# Patient Record
Sex: Female | Born: 1960
Health system: Southern US, Community
[De-identification: ages and names within clinical notes are randomized; demographics above are authoritative.]

## PROBLEM LIST (undated history)

## (undated) DIAGNOSIS — E669 Obesity, unspecified: Secondary | ICD-10-CM

## (undated) DIAGNOSIS — F32A Depression, unspecified: Secondary | ICD-10-CM

## (undated) DIAGNOSIS — F329 Major depressive disorder, single episode, unspecified: Secondary | ICD-10-CM

## (undated) DIAGNOSIS — T4145XA Adverse effect of unspecified anesthetic, initial encounter: Secondary | ICD-10-CM

## (undated) DIAGNOSIS — B977 Papillomavirus as the cause of diseases classified elsewhere: Secondary | ICD-10-CM

## (undated) DIAGNOSIS — IMO0002 Reserved for concepts with insufficient information to code with codable children: Secondary | ICD-10-CM

## (undated) DIAGNOSIS — N915 Oligomenorrhea, unspecified: Secondary | ICD-10-CM

## (undated) DIAGNOSIS — C801 Malignant (primary) neoplasm, unspecified: Secondary | ICD-10-CM

## (undated) DIAGNOSIS — G4700 Insomnia, unspecified: Secondary | ICD-10-CM

## (undated) DIAGNOSIS — N8500 Endometrial hyperplasia, unspecified: Secondary | ICD-10-CM

## (undated) DIAGNOSIS — K829 Disease of gallbladder, unspecified: Secondary | ICD-10-CM

## (undated) DIAGNOSIS — N970 Female infertility associated with anovulation: Secondary | ICD-10-CM

## (undated) DIAGNOSIS — E119 Type 2 diabetes mellitus without complications: Secondary | ICD-10-CM

## (undated) DIAGNOSIS — C50919 Malignant neoplasm of unspecified site of unspecified female breast: Secondary | ICD-10-CM

## (undated) DIAGNOSIS — F419 Anxiety disorder, unspecified: Secondary | ICD-10-CM

## (undated) DIAGNOSIS — K219 Gastro-esophageal reflux disease without esophagitis: Secondary | ICD-10-CM

## (undated) DIAGNOSIS — I10 Essential (primary) hypertension: Secondary | ICD-10-CM

## (undated) DIAGNOSIS — I2510 Atherosclerotic heart disease of native coronary artery without angina pectoris: Secondary | ICD-10-CM

## (undated) DIAGNOSIS — E785 Hyperlipidemia, unspecified: Secondary | ICD-10-CM

## (undated) DIAGNOSIS — F1010 Alcohol abuse, uncomplicated: Secondary | ICD-10-CM

## (undated) DIAGNOSIS — J302 Other seasonal allergic rhinitis: Secondary | ICD-10-CM

## (undated) DIAGNOSIS — T8859XA Other complications of anesthesia, initial encounter: Secondary | ICD-10-CM

## (undated) DIAGNOSIS — G2581 Restless legs syndrome: Secondary | ICD-10-CM

## (undated) DIAGNOSIS — N840 Polyp of corpus uteri: Secondary | ICD-10-CM

## (undated) DIAGNOSIS — Z8742 Personal history of other diseases of the female genital tract: Secondary | ICD-10-CM

## (undated) DIAGNOSIS — R7303 Prediabetes: Secondary | ICD-10-CM

## (undated) DIAGNOSIS — L720 Epidermal cyst: Secondary | ICD-10-CM

## (undated) DIAGNOSIS — E559 Vitamin D deficiency, unspecified: Secondary | ICD-10-CM

## (undated) DIAGNOSIS — G4730 Sleep apnea, unspecified: Secondary | ICD-10-CM

## (undated) DIAGNOSIS — Z8719 Personal history of other diseases of the digestive system: Secondary | ICD-10-CM

## (undated) DIAGNOSIS — R87619 Unspecified abnormal cytological findings in specimens from cervix uteri: Secondary | ICD-10-CM

## (undated) HISTORY — DX: Disease of gallbladder, unspecified: K82.9

## (undated) HISTORY — DX: Obesity, unspecified: E66.9

## (undated) HISTORY — DX: Personal history of other diseases of the female genital tract: Z87.42

## (undated) HISTORY — DX: Hyperlipidemia, unspecified: E78.5

## (undated) HISTORY — DX: Prediabetes: R73.03

## (undated) HISTORY — DX: Type 2 diabetes mellitus without complications: E11.9

## (undated) HISTORY — DX: Personal history of other diseases of the digestive system: Z87.19

## (undated) HISTORY — DX: Endometrial hyperplasia, unspecified: N85.00

## (undated) HISTORY — DX: Unspecified abnormal cytological findings in specimens from cervix uteri: R87.619

## (undated) HISTORY — DX: Female infertility associated with anovulation: N97.0

## (undated) HISTORY — DX: Papillomavirus as the cause of diseases classified elsewhere: B97.7

## (undated) HISTORY — DX: Insomnia, unspecified: G47.00

## (undated) HISTORY — DX: Alcohol abuse, uncomplicated: F10.10

## (undated) HISTORY — DX: Oligomenorrhea, unspecified: N91.5

## (undated) HISTORY — DX: Atherosclerotic heart disease of native coronary artery without angina pectoris: I25.10

## (undated) HISTORY — PX: COLONOSCOPY: SHX174

## (undated) HISTORY — DX: Polyp of corpus uteri: N84.0

## (undated) HISTORY — PX: OTHER SURGICAL HISTORY: SHX169

## (undated) HISTORY — PX: HYSTEROSCOPY: SHX211

## (undated) HISTORY — DX: Vitamin D deficiency, unspecified: E55.9

## (undated) HISTORY — PX: POLYPECTOMY: SHX149

## (undated) HISTORY — DX: Anxiety disorder, unspecified: F41.9

## (undated) HISTORY — DX: Reserved for concepts with insufficient information to code with codable children: IMO0002

## (undated) HISTORY — DX: Essential (primary) hypertension: I10

## (undated) HISTORY — DX: Epidermal cyst: L72.0

---
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
## 1999-08-29 ENCOUNTER — Other Ambulatory Visit: Admission: RE | Admit: 1999-08-29 | Discharge: 1999-08-29 | Payer: Self-pay | Admitting: *Deleted

## 2000-08-12 ENCOUNTER — Encounter: Payer: Self-pay | Admitting: Obstetrics and Gynecology

## 2000-08-12 ENCOUNTER — Encounter: Admission: RE | Admit: 2000-08-12 | Discharge: 2000-08-12 | Payer: Self-pay | Admitting: Obstetrics and Gynecology

## 2000-09-10 ENCOUNTER — Other Ambulatory Visit: Admission: RE | Admit: 2000-09-10 | Discharge: 2000-09-10 | Payer: Self-pay | Admitting: Obstetrics and Gynecology

## 2001-01-31 ENCOUNTER — Encounter: Payer: Self-pay | Admitting: Obstetrics and Gynecology

## 2001-01-31 ENCOUNTER — Inpatient Hospital Stay (HOSPITAL_COMMUNITY): Admission: AD | Admit: 2001-01-31 | Discharge: 2001-01-31 | Payer: Self-pay | Admitting: Obstetrics and Gynecology

## 2001-02-04 ENCOUNTER — Other Ambulatory Visit: Admission: RE | Admit: 2001-02-04 | Discharge: 2001-02-04 | Payer: Self-pay | Admitting: Obstetrics and Gynecology

## 2001-03-04 ENCOUNTER — Ambulatory Visit (HOSPITAL_COMMUNITY): Admission: RE | Admit: 2001-03-04 | Discharge: 2001-03-04 | Payer: Self-pay | Admitting: Obstetrics and Gynecology

## 2001-03-04 ENCOUNTER — Encounter: Payer: Self-pay | Admitting: Obstetrics and Gynecology

## 2001-03-26 ENCOUNTER — Ambulatory Visit (HOSPITAL_COMMUNITY): Admission: RE | Admit: 2001-03-26 | Discharge: 2001-03-26 | Payer: Self-pay | Admitting: Obstetrics and Gynecology

## 2001-03-26 ENCOUNTER — Encounter: Payer: Self-pay | Admitting: Obstetrics and Gynecology

## 2001-08-17 ENCOUNTER — Inpatient Hospital Stay (HOSPITAL_COMMUNITY): Admission: AD | Admit: 2001-08-17 | Discharge: 2001-08-20 | Payer: Self-pay | Admitting: Obstetrics and Gynecology

## 2002-02-24 ENCOUNTER — Other Ambulatory Visit: Admission: RE | Admit: 2002-02-24 | Discharge: 2002-02-24 | Payer: Self-pay | Admitting: Obstetrics and Gynecology

## 2002-04-02 ENCOUNTER — Other Ambulatory Visit: Admission: RE | Admit: 2002-04-02 | Discharge: 2002-04-02 | Payer: Self-pay | Admitting: Obstetrics and Gynecology

## 2002-05-04 ENCOUNTER — Ambulatory Visit (HOSPITAL_BASED_OUTPATIENT_CLINIC_OR_DEPARTMENT_OTHER): Admission: RE | Admit: 2002-05-04 | Discharge: 2002-05-04 | Payer: Self-pay | Admitting: Orthopedic Surgery

## 2002-05-18 ENCOUNTER — Ambulatory Visit (HOSPITAL_BASED_OUTPATIENT_CLINIC_OR_DEPARTMENT_OTHER): Admission: RE | Admit: 2002-05-18 | Discharge: 2002-05-18 | Payer: Self-pay | Admitting: Orthopedic Surgery

## 2002-09-30 ENCOUNTER — Encounter: Payer: Self-pay | Admitting: Obstetrics and Gynecology

## 2002-09-30 ENCOUNTER — Ambulatory Visit (HOSPITAL_COMMUNITY): Admission: RE | Admit: 2002-09-30 | Discharge: 2002-09-30 | Payer: Self-pay | Admitting: Obstetrics and Gynecology

## 2002-12-07 ENCOUNTER — Other Ambulatory Visit: Admission: RE | Admit: 2002-12-07 | Discharge: 2002-12-07 | Payer: Self-pay | Admitting: *Deleted

## 2003-03-06 ENCOUNTER — Inpatient Hospital Stay (HOSPITAL_COMMUNITY): Admission: AD | Admit: 2003-03-06 | Discharge: 2003-03-06 | Payer: Self-pay | Admitting: Obstetrics and Gynecology

## 2003-03-10 ENCOUNTER — Inpatient Hospital Stay (HOSPITAL_COMMUNITY): Admission: AD | Admit: 2003-03-10 | Discharge: 2003-03-11 | Payer: Self-pay | Admitting: Obstetrics and Gynecology

## 2003-08-03 ENCOUNTER — Other Ambulatory Visit: Admission: RE | Admit: 2003-08-03 | Discharge: 2003-08-03 | Payer: Self-pay | Admitting: Obstetrics and Gynecology

## 2004-09-04 ENCOUNTER — Other Ambulatory Visit: Admission: RE | Admit: 2004-09-04 | Discharge: 2004-09-04 | Payer: Self-pay | Admitting: Obstetrics and Gynecology

## 2004-10-05 ENCOUNTER — Ambulatory Visit (HOSPITAL_COMMUNITY): Admission: RE | Admit: 2004-10-05 | Discharge: 2004-10-05 | Payer: Self-pay | Admitting: Obstetrics and Gynecology

## 2005-09-11 ENCOUNTER — Other Ambulatory Visit: Admission: RE | Admit: 2005-09-11 | Discharge: 2005-09-11 | Payer: Self-pay | Admitting: Obstetrics and Gynecology

## 2005-10-09 ENCOUNTER — Ambulatory Visit (HOSPITAL_COMMUNITY): Admission: RE | Admit: 2005-10-09 | Discharge: 2005-10-09 | Payer: Self-pay | Admitting: Obstetrics and Gynecology

## 2006-11-25 ENCOUNTER — Ambulatory Visit (HOSPITAL_COMMUNITY): Admission: RE | Admit: 2006-11-25 | Discharge: 2006-11-25 | Payer: Self-pay | Admitting: Obstetrics and Gynecology

## 2007-11-27 ENCOUNTER — Ambulatory Visit (HOSPITAL_COMMUNITY): Admission: RE | Admit: 2007-11-27 | Discharge: 2007-11-27 | Payer: Self-pay | Admitting: Obstetrics and Gynecology

## 2008-11-30 ENCOUNTER — Ambulatory Visit (HOSPITAL_COMMUNITY): Admission: RE | Admit: 2008-11-30 | Discharge: 2008-11-30 | Payer: Self-pay | Admitting: Obstetrics and Gynecology

## 2010-01-04 ENCOUNTER — Ambulatory Visit (HOSPITAL_COMMUNITY): Admission: RE | Admit: 2010-01-04 | Discharge: 2010-01-04 | Payer: Self-pay | Admitting: Obstetrics and Gynecology

## 2010-06-04 ENCOUNTER — Ambulatory Visit (HOSPITAL_COMMUNITY)
Admission: RE | Admit: 2010-06-04 | Discharge: 2010-06-04 | Payer: Self-pay | Source: Home / Self Care | Admitting: Obstetrics and Gynecology

## 2010-06-04 HISTORY — PX: OTHER SURGICAL HISTORY: SHX169

## 2010-07-22 ENCOUNTER — Encounter: Payer: Self-pay | Admitting: Obstetrics and Gynecology

## 2010-07-26 ENCOUNTER — Emergency Department (HOSPITAL_COMMUNITY)
Admission: EM | Admit: 2010-07-26 | Discharge: 2010-07-26 | Payer: Self-pay | Source: Home / Self Care | Admitting: Family Medicine

## 2010-09-10 LAB — CBC
Hemoglobin: 11.4 g/dL — ABNORMAL LOW (ref 12.0–15.0)
RBC: 4.12 MIL/uL (ref 3.87–5.11)

## 2010-09-10 LAB — PREGNANCY, URINE

## 2010-11-16 NOTE — Op Note (Signed)
   NAME:  Candace [name], Candace [name]                            ACCOUNT NO.:  [id number]   MEDICAL RECORD NO.:  [id number]                   PATIENT TYPE:  AMB   LOCATION:  DSC                                  FACILITY:  [facility]   PHYSICIAN:  Cindee Salt, M.D.                    DATE OF BIRTH:  [birth year]   DATE OF PROCEDURE:  05/18/2002  DATE OF DISCHARGE:                                 OPERATIVE REPORT   PREOPERATIVE DIAGNOSIS:  Stenosing tenosynovitis right thumb.   POSTOPERATIVE DIAGNOSIS:  Stenosing tenosynovitis right thumb.   OPERATION:  Release A1 pulley left thumb.   SURGEON:  Cindee Salt, M.D.   ASSISTANTCarolyne Fiscal, R.N.   ANESTHESIA:  Forearm based IV regional with supplementation.   INDICATIONS FOR PROCEDURE:  The patient is a 50 year old female with  triggering of her thumbs. She has undergone release of her right and is  admitted down for release of the left.   DESCRIPTION OF PROCEDURE:  The patient is brought to the operating room  where a forearm based IV regional anesthetic was carried out without  difficulty.  She was prepped and draped using Betadine scrubbing solution  with the left arm free.  A transverse incision was made over the A1 pulley,  carried down through subcutaneous tissue. Bleeders were electrocauterized.  The A1 pulley was identified.  This was released in its radial aspect  protecting the radial  nerve.  The thumb placed through full range of  motion.  No further triggering was identified. The wound was irrigated. Skin  closed with interrupted 5-0 nylon suture with sterile compressive dressing  was applied.  The patient tolerated the procedure well and was taken to the  recovery room for observation in satisfactory condition.  She is discharged  home, to return to the [facility] [place] in one week on Vicodin and  Septra DS.                                               Cindee Salt, M.D.    GK/MEDQ  D:  05/18/2002  T:  05/18/2002  Job:  [id number]

## 2010-11-16 NOTE — Discharge Summary (Signed)
[facility] of [facility]  Patient:    Candace [name], Candace [name] Visit Number: [id number] MRN: [id number]          Service Type: OBS Location: 910B 9165 01 Attending Physician:  Leonard Schwartz Dictated by:   Wynelle Bourgeois, CNM Admit Date:  08/17/2001 Disc. Date: 08/20/01                             Discharge Summary  ADMISSION DIAGNOSES: 1. Intrauterine pregnancy at 38 weeks. 2. Spontaneous rupture of membranes at term. 3. Prodromal labor.  DISCHARGE DIAGNOSES: 1. Intrauterine pregnancy at 38 weeks. 2. Spontaneous rupture of membranes at term. 3. Prodromal labor. 4. Variable fetal heart rate decelerations. 5. Prolonged second stage.  PROCEDURES: 1. Epidural anesthesia. 2. Vacuum assisted vaginal delivery. 3. Perineal laceration repair.  HOSPITAL COURSE:  The patient was admitted on 08/17/01, after rupturing membranes at term.  She was in prodromal labor at that time, and elected expectant management.  Through the night, her labor progressed very slowly, and eventually Pitocin augmentation was begun on the next morning.  She progressed steadily throughout the following day, got to complete dilation and pushed for several hours, achieving +2 station, but was unable to push the vertex under the pubic bone.  Dr. Normand Sloop was consulted, and assisted with a vacuum extraction which was performed successfully with only four pulls.  The baby was delivered easily, and handed to the pediatric team, and did well after that.  Cord pH was 7.124, and Apgars were 1 and 9.  The babys name is [name], and she weighed 7 pounds 10 ounces.  Mother and baby did well on the following two postpartum days, and on postpartum day #2, she was ready to go home.  Her main complaint consisted of hemorrhoid pain.  Her vital signs were stable.  Maximum temperature was 98.2, heart rate was regular.  Lungs were clear.  Extremities were within normal limits, except for mild left peroneal  dermatome paresthesia.  Her fundus was firm, lochia was small, perineum was healing.  She was breast-feeding well, and she was deemed to have received full benefit of her hospital stay and was discharged home.  DISCHARGE LABORATORY DATA:  White blood cell count 12.9, hemoglobin 11.9, hematocrit 34.9, platelets 172.  DISCHARGE MEDICATIONS: 1. Motrin 600 mg p.o. q.6h. p.r.n. 2. ProctoFoam HC apply as directed p.r.n. to hemorrhoids.  DISCHARGE INSTRUCTIONS:  Via [employer] OB/GYN handout.  FOLLOWUP:  In six weeks at [facility] or p.r.n. Dictated by:   Wynelle Bourgeois, CNM Attending Physician:  Leonard Schwartz DD:  08/20/01 TD:  08/20/01 Job: [id number]

## 2010-11-16 NOTE — Op Note (Signed)
   NAME:  Candace [name], Candace [name]                            ACCOUNT NO.:  [id number]   MEDICAL RECORD NO.:  [id number]                   PATIENT TYPE:  SPE   LOCATION:  DFTL                                 FACILITY:  [facility]   PHYSICIAN:  Cindee Salt, M.D.                    DATE OF BIRTH:  [birth year]   DATE OF PROCEDURE:  05/04/2002  DATE OF DISCHARGE:  04/02/2002                                 OPERATIVE REPORT   PREOPERATIVE DIAGNOSIS:  Stenosing tenosynovitis, right thumb.   POSTOPERATIVE DIAGNOSIS:  Stenosing tenosynovitis, right thumb.   OPERATION:  Release A1 pulley right thumb.   SURGEON:  Cindee Salt, M.D.   ASSISTANT:  R.N.   ANESTHESIA:  Forearm based IV regional.   INDICATIONS:  The patient is a 50 year old female with a history of  treatment of multiple digits. She is brought in for release of her left  thumb A1 pulley and DeQuervain's, however, has a burn on that side and  desires proceeding to have the right A1 pulley thumb released for  triggering. This was agreed to.   DESCRIPTION OF PROCEDURE:  The patient was brought to the operating room and  a forearm based IV regional anesthetic was carried out without difficulty.  She was prepped and draped using Duraprep. A transverse incision was made  over the A1 pulley of the right thumb and was carried down through the  subcutaneous tissue and the neurovascular structures were identified and  protected. The A1 pulley was identified and an incision was then made on the  radial side. This fully released the tendon which was  passed through a full  range of motion without further triggering. The oblique pulley was left  intact.  The wound was irrigated. The skin was closed with interrupted 5-0  nylon suture. A sterile compressive dressing was applied.  The patient tolerated the procedure well and was taken to the recovery room  for observation in satisfactory condition. She was discharged to home to  return to the [facility]  [place] in one week on Vicodin and Keflex.                                               Cindee Salt, M.D.    GK/MEDQ  D:  05/04/2002  T:  05/04/2002  Job:  [id number]
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
## 2010-11-16 NOTE — Op Note (Signed)
[facility] of [facility]  Patient:    Candace [name], Candace [name] Visit Number: [id number] MRN: [id number]          Service Type: OBS Location: MATC Attending Physician:  Leonard Schwartz Proc. Date: 03/04/01 Admit Date:  01/31/2001 Discharge Date: 01/31/2001                             Operative Report  PREOPERATIVE DIAGNOSIS:       Intrauterine pregnancy at [redacted] weeks gestation, maternal age of 12.  POSTOPERATIVE DIAGNOSIS:      Intrauterine pregnancy at [redacted] weeks gestation, maternal age of 64.  OPERATION:                    Genetic amniocentesis under ultrasound guidance.  SURGEON:                      Vanessa P. Haygood, M.D.  ASSISTANT:  ANESTHESIA:                   Local.  ESTIMATED BLOOD LOSS:         Less than 10 cc.  COMPLICATIONS:                None.  FINDINGS:                     The initial ultrasound revealed an anterior placenta, a fetus consistent with approximately 15 to [redacted] weeks gestation, and adequate amniotic fluid.  DESCRIPTION OF PROCEDURE:     After a discussion of risks and benefits had been held on the telephone, the procedure was scheduled.  The patient underwent a screening ultrasound to document fetal viability and amniosite. After an amniosite had been identified which was away from the cord insertion site and free of fetal parts or cord, an area to the right of the midline and suprapubic was marked with a pen.  This area was prepped with multiple layers of Betadine and draped as a sterile field.  The area was infiltrated with 3 cc of 1% Xylocaine.  The amniotic fluid pocket was then accessed with a 22 gauge needle on a single stick under ultrasound guidance.  5 cc of clear fluid were drawn into the first syringe and the syringes changed with 15 cc of clear fluid drawn into the second syringe.  The amniocentesis site was documented on ultrasound and the needle removed.  The post amniocentesis heart rate was 150 beats per minute.   The patient tolerated the procedure well.  She was given written instructions for post amniocentesis care.  The fluid was transferred sterilely to transport tubes and was sent to [facility] for evaluation. Attending Physician:  Leonard Schwartz DD:  03/04/01 TD:  03/04/01 Job: [id number]

## 2011-01-07 ENCOUNTER — Other Ambulatory Visit (HOSPITAL_COMMUNITY): Payer: Self-pay | Admitting: Obstetrics and Gynecology

## 2011-01-07 DIAGNOSIS — Z1231 Encounter for screening mammogram for malignant neoplasm of breast: Secondary | ICD-10-CM

## 2011-01-16 ENCOUNTER — Ambulatory Visit (HOSPITAL_COMMUNITY)
Admission: RE | Admit: 2011-01-16 | Discharge: 2011-01-16 | Disposition: A | Discharge status: Home / Self Care | Payer: 59 | Source: Ambulatory Visit | Attending: Obstetrics and Gynecology | Admitting: Obstetrics and Gynecology

## 2011-01-16 DIAGNOSIS — Z1231 Encounter for screening mammogram for malignant neoplasm of breast: Secondary | ICD-10-CM | POA: Insufficient documentation

## 2011-01-21 ENCOUNTER — Other Ambulatory Visit: Payer: Self-pay | Admitting: Obstetrics and Gynecology

## 2011-01-21 DIAGNOSIS — R928 Other abnormal and inconclusive findings on diagnostic imaging of breast: Secondary | ICD-10-CM

## 2011-02-05 ENCOUNTER — Ambulatory Visit
Admission: RE | Admit: 2011-02-05 | Discharge: 2011-02-05 | Disposition: A | Discharge status: Home / Self Care | Payer: 59 | Source: Ambulatory Visit | Attending: Obstetrics and Gynecology | Admitting: Obstetrics and Gynecology

## 2011-02-05 DIAGNOSIS — R928 Other abnormal and inconclusive findings on diagnostic imaging of breast: Secondary | ICD-10-CM

## 2011-03-22 ENCOUNTER — Other Ambulatory Visit: Payer: Self-pay | Admitting: Orthopaedic Surgery

## 2011-03-22 ENCOUNTER — Encounter (HOSPITAL_COMMUNITY): Payer: 59

## 2011-03-22 LAB — SURGICAL PCR SCREEN
MRSA, PCR: NEGATIVE
Staphylococcus aureus: NEGATIVE

## 2011-03-22 LAB — HCG, SERUM, QUALITATIVE: Preg, Serum: NEGATIVE

## 2011-03-22 LAB — PROTIME-INR: INR: 0.98 (ref 0.00–1.49)

## 2011-03-24 ENCOUNTER — Inpatient Hospital Stay (INDEPENDENT_AMBULATORY_CARE_PROVIDER_SITE_OTHER)
Admission: RE | Admit: 2011-03-24 | Discharge: 2011-03-24 | Disposition: A | Discharge status: Home / Self Care | Payer: 59 | Source: Ambulatory Visit | Attending: Emergency Medicine | Admitting: Emergency Medicine

## 2011-03-24 DIAGNOSIS — L089 Local infection of the skin and subcutaneous tissue, unspecified: Secondary | ICD-10-CM

## 2011-03-26 ENCOUNTER — Ambulatory Visit (HOSPITAL_COMMUNITY)
Admission: RE | Admit: 2011-03-26 | Discharge: 2011-03-26 | Disposition: A | Discharge status: Home / Self Care | Payer: 59 | Source: Ambulatory Visit | Attending: Orthopaedic Surgery | Admitting: Orthopaedic Surgery

## 2011-03-26 DIAGNOSIS — G5600 Carpal tunnel syndrome, unspecified upper limb: Secondary | ICD-10-CM | POA: Insufficient documentation

## 2011-03-26 DIAGNOSIS — Z01812 Encounter for preprocedural laboratory examination: Secondary | ICD-10-CM | POA: Insufficient documentation

## 2011-03-26 HISTORY — PX: CARPAL TUNNEL RELEASE: SHX101

## 2011-03-27 LAB — CULTURE, ROUTINE-ABSCESS

## 2011-03-30 NOTE — H&P (Signed)
  Candace [name], FATH NO.:  [id number]  MEDICAL RECORD NO.:  [id number]  LOCATION:  DAYL                         FACILITY:  [facility]  PHYSICIAN:  Vanita Panda. Magnus Ivan, M.D.DATE OF BIRTH:  1961/05/08  DATE OF ADMISSION:  03/26/2011 DATE OF DISCHARGE:                             HISTORY & PHYSICAL   CHIEF COMPLAINT:  Right hand numbness and tingling and known moderate to severe carpal tunnel syndrome.  HISTORY OF PRESENT ILLNESS:  Candace [name] is a 50 year old right hand dominant female who is actually well known to me and a good friend.  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  She has had bilateral hand numbness and tingling for some time now and even some pain and we have had nerve conduction studies of both her upper extremities recently, which did show moderate to quite severe nerve entrapment of the median nerve at the wrist on both sides.  She is at the point she does wish to proceed with carpal tunnel release and she would like to start with the right side.  She understands the risk and benefits of surgery as well and the fact that she will get some short term improvement and then the long term improvement will come as the nerve starts to repair itself.  PAST MEDICAL HISTORY:  Depression.  MEDICATIONS: 1. Wellbutrin. 2. Prozac. 3. Multivitamin. 4. Zyrtec.  ALLERGIES:  No known drug allergies.  FAMILY MEDICAL HISTORY:  Heart disease.  SOCIAL HISTORY:  She is separated currently.  She works in hospital administration, in bed control.  She does not smoke and does not drink.  REVIEW OF SYSTEMS:  Negative for chest pain, shortness of breath, fever, chills, nausea, vomiting.  PHYSICAL EXAMINATION:  VITAL SIGNS:  She is afebrile with stable vital signs. GENERAL:  She is alert and oriented x3, in no acute distress, obvious discomfort. HEENT:  Normocephalic, atraumatic.  Pupils equal, round, and reactive to light.  Extraocular muscles  intact. NECK:  Supple. LUNGS:  Clear to auscultation bilaterally. HEART:  Regular rate and rhythm. ABDOMEN:  Benign. EXTREMITIES:  Right hand shows no muscle atrophy at all.  She has positive Phalen's and Tinel's signs and carpal tunnel and subjective numbness in the median nerve distribution.  Nerve conduction studies confirmed moderate to severe carpal tunnel syndrome of the right upper extremity.  ASSESSMENT:  This is a 50 year old female with severe carpal tunnel syndrome involving her bilateral upper extremities, right worse than left.  PLAN:  We will proceed with a right carpal tunnel release today.  Again, she understands the risks and benefits of this and does wish to proceed with surgery.     Vanita Panda. Magnus Ivan, M.D.     CYB/MEDQ  D:  03/26/2011  T:  03/26/2011  Job:  [id number]  Electronically Signed by Doneen Poisson M.D. on 03/30/2011 11:46:26 AM

## 2011-03-30 NOTE — Op Note (Signed)
  [name], [name] NO.:  [id number]  MEDICAL RECORD NO.:  [id number]  LOCATION:  [facility]                         FACILITY:  [facility]  PHYSICIAN:  Vanita Panda. Magnus Ivan, M.D.DATE OF BIRTH:  12/02/1960  DATE OF PROCEDURE:  03/26/2011 DATE OF DISCHARGE:  03/26/2011                              OPERATIVE REPORT   PREOPERATIVE DIAGNOSIS:  Right upper extremity moderate to severe carpal tunnel syndrome.  POSTOPERATIVE DIAGNOSIS:  Right upper extremity moderate to severe carpal tunnel syndrome.  PROCEDURE:  Right open carpal tunnel release.  SURGEON:  Vanita Panda. Magnus Ivan, M.D.  ANTIBIOTICS: 1. 2 g IV Ancef. 2. 600 mg IV clindamycin.  TOURNIQUET TIME:  Less than 50 minutes.  BLOOD LOSS:  Minimal.  ANESTHESIA: 1. General. 2. Local 0.25% plain Sensorcaine.  COMPLICATIONS:  None.  INDICATIONS:  Candace [name] is a 50 year old right-hand dominant female with bilateral moderate to severe carpal tunnel syndrome with right as her dominant side and she wished to proceed with a right open carpal tunnel release at this time.  Risks and benefits of surgery have been explained to her in detail and she does understand this and wished to proceed with surgery.  DESCRIPTION OF PROCEDURE:  After informed consent was obtained, appropriate right upper extremity was marked, and she was brought to the operating room, placed supine on the operating table with the right arm on the arm table.  General anesthesia was then obtained.  Non-sterile tourniquet was  placed around her upper right arm and her arm, hand, and wrist were prepped and draped with DuraPrep and sterile drapes.  A time- out was called and she was identified as the correct patient, correct right upper extremity.  I then used an Esmarch __________ arm and tourniquet was inflated to 250 mmHg of pressure.  We then made an incision in the palm and carried this down deeply to the transverse carpal ligament.   Identified the distal edge of the transverse carpal ligament and protected the median nerve while we slowly divided the transverse carpal ligament in its entirety.  I was able to visualize the median nerve.  She did have some tenosynovitis around the tendon and the nerve itself, but the nerve was intact.  I then copiously irrigated the tissues with normal saline and closed the skin with interrupted 4-0 nylon suture.  The incision was infiltrated with 0.25% plain Sensorcaine.  Xeroform followed by a well-padded sterile dressing was applied.  The tourniquet was let down and __________.  She was awakened, extubated, and taken to recovery room in stable condition.  All final counts were correct and there were no complications noted.     Vanita Panda. Magnus Ivan, M.D.     CYB/MEDQ  D:  03/26/2011  T:  03/26/2011  Job:  [id number]  Electronically Signed by Doneen Poisson M.D. on 03/30/2011 11:46:24 AM

## 2011-05-21 ENCOUNTER — Encounter (HOSPITAL_COMMUNITY): Payer: Self-pay | Admitting: Pharmacy Technician

## 2011-05-22 ENCOUNTER — Encounter (HOSPITAL_COMMUNITY): Payer: Self-pay

## 2011-05-22 ENCOUNTER — Encounter (HOSPITAL_COMMUNITY)
Admission: RE | Admit: 2011-05-22 | Discharge: 2011-05-22 | Disposition: A | Discharge status: Home / Self Care | Payer: 59 | Source: Ambulatory Visit | Attending: Orthopaedic Surgery | Admitting: Orthopaedic Surgery

## 2011-05-22 HISTORY — DX: Major depressive disorder, single episode, unspecified: F32.9

## 2011-05-22 HISTORY — DX: Depression, unspecified: F32.A

## 2011-05-22 LAB — CBC
Hemoglobin: 14.1 g/dL (ref 12.0–15.0)
MCH: 29.4 pg (ref 26.0–34.0)
MCHC: 33.7 g/dL (ref 30.0–36.0)
MCV: 87.5 fL (ref 78.0–100.0)
RBC: 4.79 MIL/uL (ref 3.87–5.11)

## 2011-05-22 LAB — PROTIME-INR: Prothrombin Time: 12.5 seconds (ref 11.6–15.2)

## 2011-05-22 LAB — SURGICAL PCR SCREEN: MRSA, PCR: NEGATIVE

## 2011-05-22 NOTE — Patient Instructions (Signed)
20 Candace [name]  05/22/2011   Your procedure is scheduled on:  Tues. 05/28/2011  Report to [facility] at 1030 AM.  Call this number if you have problems the morning of surgery: [phone]   Remember:   Do not eat food:After Midnight.  Do not drink clear liquids: After Midnight.  Take these medicines the morning of surgery with A SIP OF WATER: zyrtec, wellbutrin   Do not wear jewelry, make-up or nail polish.  Do not wear lotions, powders, or perfumes.   Do not shave 48 hours prior to surgery.  Do not bring valuables to the hospital.  Contacts, dentures or bridgework may not be worn into surgery.  Leave suitcase in the car. After surgery it may be brought to your room.  For patients admitted to the hospital, checkout time is 11:00 AM the day of discharge.   Patients discharged the day of surgery will not be allowed to drive home.  Name and phone number of your driver: [phone]  Special Instructions: CHG Shower Use Special Wash: 1/2 bottle night before surgery and 1/2 bottle morning of surgery.   Please read over the following fact sheets that you were given: MRSA Information

## 2011-05-28 ENCOUNTER — Ambulatory Visit (HOSPITAL_COMMUNITY): Payer: 59 | Admitting: Anesthesiology

## 2011-05-28 ENCOUNTER — Encounter (HOSPITAL_COMMUNITY)
Admission: RE | Disposition: A | Discharge status: Home / Self Care | Payer: Self-pay | Source: Ambulatory Visit | Attending: Orthopaedic Surgery

## 2011-05-28 ENCOUNTER — Encounter (HOSPITAL_COMMUNITY): Payer: Self-pay | Admitting: *Deleted

## 2011-05-28 ENCOUNTER — Encounter (HOSPITAL_COMMUNITY): Payer: Self-pay | Admitting: Anesthesiology

## 2011-05-28 ENCOUNTER — Ambulatory Visit (HOSPITAL_COMMUNITY)
Admission: RE | Admit: 2011-05-28 | Discharge: 2011-05-28 | Disposition: A | Discharge status: Home / Self Care | Payer: 59 | Source: Ambulatory Visit | Attending: Orthopaedic Surgery | Admitting: Orthopaedic Surgery

## 2011-05-28 DIAGNOSIS — G5600 Carpal tunnel syndrome, unspecified upper limb: Secondary | ICD-10-CM | POA: Insufficient documentation

## 2011-05-28 DIAGNOSIS — Z01812 Encounter for preprocedural laboratory examination: Secondary | ICD-10-CM | POA: Insufficient documentation

## 2011-05-28 DIAGNOSIS — G5602 Carpal tunnel syndrome, left upper limb: Secondary | ICD-10-CM

## 2011-05-28 HISTORY — PX: CARPAL TUNNEL RELEASE: SHX101

## 2011-05-28 SURGERY — CARPAL TUNNEL RELEASE
Anesthesia: General | Site: Hand | Laterality: Left | Wound class: Clean

## 2011-05-28 MED ORDER — ONDANSETRON HCL 4 MG/2ML IJ SOLN
INTRAMUSCULAR | Status: DC | PRN
  Administered 2011-05-28: 4 mg via INTRAVENOUS

## 2011-05-28 MED ORDER — BUPIVACAINE HCL 0.25 % IJ SOLN
INTRAMUSCULAR | Status: DC | PRN
  Administered 2011-05-28: 9 mL

## 2011-05-28 MED ORDER — CEFAZOLIN SODIUM-DEXTROSE 2-3 GM-% IV SOLR
2.0000 g | Freq: Once | INTRAVENOUS | Status: AC
  Administered 2011-05-28: 2 g via INTRAVENOUS

## 2011-05-28 MED ORDER — MIDAZOLAM HCL 5 MG/5ML IJ SOLN
INTRAMUSCULAR | Status: DC | PRN
  Administered 2011-05-28: 2 mg via INTRAVENOUS

## 2011-05-28 MED ORDER — PROMETHAZINE HCL 25 MG/ML IJ SOLN: 6.2500 mg | INTRAMUSCULAR | Status: DC | PRN

## 2011-05-28 MED ORDER — FENTANYL CITRATE 0.05 MG/ML IJ SOLN
INTRAMUSCULAR | Status: AC
  Filled 2011-05-28: qty 2

## 2011-05-28 MED ORDER — FENTANYL CITRATE 0.05 MG/ML IJ SOLN
25.0000 ug | INTRAMUSCULAR | Status: DC | PRN
  Administered 2011-05-28 (×2): 50 ug via INTRAVENOUS

## 2011-05-28 MED ORDER — ACETAMINOPHEN 10 MG/ML IV SOLN
INTRAVENOUS | Status: DC | PRN
  Administered 2011-05-28: 1000 mg via INTRAVENOUS

## 2011-05-28 MED ORDER — LACTATED RINGERS IV SOLN
INTRAVENOUS | Status: DC | PRN
  Administered 2011-05-28: 12:00:00 via INTRAVENOUS

## 2011-05-28 MED ORDER — PROPOFOL 10 MG/ML IV EMUL
INTRAVENOUS | Status: DC | PRN
  Administered 2011-05-28: 200 mL via INTRAVENOUS

## 2011-05-28 MED ORDER — SODIUM CHLORIDE 0.9 % IR SOLN
Status: DC | PRN
  Administered 2011-05-28: 1000 mL

## 2011-05-28 MED ORDER — LACTATED RINGERS IV SOLN: INTRAVENOUS | Status: DC

## 2011-05-28 MED ORDER — ACETAMINOPHEN 10 MG/ML IV SOLN
INTRAVENOUS | Status: AC
  Filled 2011-05-28: qty 100

## 2011-05-28 MED ORDER — CEFAZOLIN SODIUM-DEXTROSE 2-3 GM-% IV SOLR
INTRAVENOUS | Status: AC
  Filled 2011-05-28: qty 50

## 2011-05-28 MED ORDER — BUPIVACAINE HCL (PF) 0.25 % IJ SOLN
INTRAMUSCULAR | Status: AC
  Filled 2011-05-28: qty 30

## 2011-05-28 MED ORDER — FENTANYL CITRATE 0.05 MG/ML IJ SOLN
INTRAMUSCULAR | Status: DC | PRN
  Administered 2011-05-28: 50 ug via INTRAVENOUS
  Administered 2011-05-28 (×2): 100 ug via INTRAVENOUS

## 2011-05-28 SURGICAL SUPPLY — 20 items
BANDAGE ELASTIC 4 VELCRO ST LF (GAUZE/BANDAGES/DRESSINGS) ×2 IMPLANT
CLOTH BEACON ORANGE TIMEOUT ST (SAFETY) ×2 IMPLANT
CORDS BIPOLAR (ELECTRODE) ×2 IMPLANT
CUFF TOURN SGL QUICK 18 (TOURNIQUET CUFF) ×1 IMPLANT
DRAPE SURG 17X11 SM STRL (DRAPES) ×1 IMPLANT
DURAPREP 26ML APPLICATOR (WOUND CARE) ×2 IMPLANT
GAUZE SPONGE 4X4 12PLY STRL LF (GAUZE/BANDAGES/DRESSINGS) ×1 IMPLANT
GAUZE XEROFORM 1X8 LF (GAUZE/BANDAGES/DRESSINGS) ×2 IMPLANT
GLOVE ORTHO TXT STRL SZ7.5 (GLOVE) ×2 IMPLANT
GOWN STRL REIN XL XLG (GOWN DISPOSABLE) ×2 IMPLANT
KIT BASIN OR (CUSTOM PROCEDURE TRAY) ×2 IMPLANT
NEEDLE HYPO 22GX1.5 SAFETY (NEEDLE) ×1 IMPLANT
NS IRRIG 1000ML POUR BTL (IV SOLUTION) ×2 IMPLANT
PACK LOWER EXTREMITY WL (CUSTOM PROCEDURE TRAY) ×2 IMPLANT
PADDING WEBRIL 4 STERILE (GAUZE/BANDAGES/DRESSINGS) ×1 IMPLANT
POSITIONER SURGICAL ARM (MISCELLANEOUS) ×2 IMPLANT
SUT ETHILON 4 0 PS 2 18 (SUTURE) ×2 IMPLANT
SYR CONTROL 10ML LL (SYRINGE) IMPLANT
TOWEL OR 17X26 10 PK STRL BLUE (TOWEL DISPOSABLE) ×4 IMPLANT
WATER STERILE IRR 1500ML POUR (IV SOLUTION) ×2 IMPLANT

## 2011-05-28 NOTE — Brief Op Note (Signed)
05/28/2011  12:48 PM  PATIENT:  Candace [name]  50 y.o. female  PRE-OPERATIVE DIAGNOSIS:  left carpal tunnel syndrome  POST-OPERATIVE DIAGNOSIS:  left carpal tunnel syndrome  PROCEDURE:  Procedure(s): CARPAL TUNNEL RELEASE  SURGEON:  Surgeon(s): Kathryne Hitch  PHYSICIAN ASSISTANT:   ASSISTANTS: none   ANESTHESIA:   local and general  EBL:  Total I/O In: 300 [I.V.:300] Out: -   BLOOD ADMINISTERED:none  DRAINS: none   LOCAL MEDICATIONS USED:  MARCAINE 5CC  SPECIMEN:  No Specimen  DISPOSITION OF SPECIMEN:  N/A  COUNTS:  YES  TOURNIQUET:   Total Tourniquet Time Documented: Upper Arm (Left) - 11 minutes  DICTATION: .Other Dictation: Dictation Number [id number]  PLAN OF CARE: Discharge to home after PACU  PATIENT DISPOSITION:  PACU - hemodynamically stable.   Delay start of Pharmacological VTE agent (>24hrs) due to surgical blood loss or risk of bleeding:  {YES/NO/NOT APPLICABLE:20182

## 2011-05-28 NOTE — Progress Notes (Signed)
Circulation good to Lt hand. Moves fingers without problems

## 2011-05-28 NOTE — Progress Notes (Signed)
Booking card given to family  

## 2011-05-28 NOTE — Op Note (Signed)
NAMERAMONIA, MCCLARAN NO.:  [id number]  MEDICAL RECORD NO.:  [id number]  LOCATION:  [facility]                         FACILITY:  [facility]  PHYSICIAN:  Vanita Panda. Magnus Ivan, M.D.DATE OF BIRTH:  May 21, 1961  DATE OF PROCEDURE:  05/28/2011 DATE OF DISCHARGE:                              OPERATIVE REPORT   PREOPERATIVE DIAGNOSIS:  Moderate-to-severe left carpal tunnel syndrome.  POSTOPERATIVE DIAGNOSIS:  Moderate-to-severe left carpal tunnel syndrome.  PROCEDURE:  Left open carpal tunnel release.  SURGEON:  Vanita Panda. Magnus Ivan, M.D..  ANESTHESIA: 1. General. 2. Local with 0.25% plain Sensorcaine.  TOURNIQUET TIME:  Less than 20 minutes.  BLOOD LOSS:  Minimal.  COMPLICATIONS:  None.  INDICATIONS:  Candace [name] is a 50 year old female, well known to me.  She has bilateral moderate-to-severe carpal tunnel syndrome.  A month ago, she underwent a successful right open carpal tunnel release.  She now presents for left side.  The risks and benefits of surgery including the risk of nerve and vessel injury and the benefit of improved nerve function with decreased numbness, tingling, and increased strength.  She does wish to proceed with surgery.  PROCEDURE DESCRIPTION:  After informed consent was obtained, appropriate left wrist was marked.  She was brought to the operating room and placed supine on the operating table.  General anesthesia was then obtained.  A nonsterile tourniquet was placed around her upper left forearm and her left hand and wrist were prepped and draped with DuraPrep and sterile drapes including sterile stockinette.  Time-out was called and she was identified as correct patient, correct left wrist.  I then made an incision in the palm and dissected down to the distal edge of the transverse carpal ligament.  I protected the median nerve and then divided the transverse carpal ligament in its entirety.  I explored the median nerve and found the motor  branch to be intact.  I then copiously irrigate the tissues with normal saline solution and reapproximated the skin with interrupted 4-0 nylon suture, then infiltrated the incision with 0.25% plain Sensorcaine.  Xeroform followed by well-padded sterile dressing was applied.  The tourniquet was let down.  Her fingers pinked in nicely.  She was awakened, extubated, and taken to recovery room in stable condition.  All final counts were correct and there were no complications noted.     Vanita Panda. Magnus Ivan, M.D.     CYB/MEDQ  D:  05/28/2011  T:  05/28/2011  Job:  [id number]

## 2011-05-28 NOTE — H&P (Signed)
Candace [name] is an 50 y.o. female.   Chief Complaint: Left hand numbness, know carpal tunnel syndrome HPI: 50 yo female well known to me with quite significant bilateral CTS.  Underwent previous successful right carpal tunnel release and now presents for surgery on her left side.  She fully understands the risks and benefits of surgery  Past Medical History  Diagnosis Date  . Depression     Past Surgical History  Procedure Date  . Tonsillectomy     as child  . Carpal tunnel release 03/26/2011    right hand  . Uterine ablation 06/04/2010  . Trigger fingers 8 yrs. ago    both done months apart    History reviewed. No pertinent family history. Social History:  does not have a smoking history on file. She does not have any smokeless tobacco history on file. She reports that she does not drink alcohol or use illicit drugs.  Allergies: No Known Allergies  Medications Prior to Admission  Medication Dose Route Frequency Provider Last Rate Last Dose  . ceFAZolin (ANCEF) IVPB 2 g/50 mL premix  2 g Intravenous Once Kathryne Hitch       No current outpatient prescriptions on file as of 05/28/2011.    No results found for this or any previous visit (from the past 48 hour(s)). No results found.  Review of Systems  Constitutional: Negative.   HENT: Negative.   Eyes: Negative.   Respiratory: Negative.   Cardiovascular: Negative.   Gastrointestinal: Negative.   Genitourinary: Negative.   Musculoskeletal: Negative.   Skin: Negative.   Neurological: Positive for tingling.  Endo/Heme/Allergies: Negative.   Psychiatric/Behavioral: Negative.   All other systems reviewed and are negative.    Blood pressure 132/81, pulse 75, temperature 96.9 F (36.1 C), temperature source Oral, resp. rate 18, SpO2 100.00%. Physical Exam  Nursing note and vitals reviewed. Constitutional: She is oriented to person, place, and time. She appears well-developed and well-nourished.  HENT:  Head:  Normocephalic and atraumatic.  Eyes: EOM are normal. Pupils are equal, round, and reactive to light.  Neck: Normal range of motion. Neck supple.  Cardiovascular: Normal rate, regular rhythm, normal heart sounds and intact distal pulses.   Respiratory: Effort normal and breath sounds normal.  GI: Soft. Bowel sounds are normal.  Musculoskeletal:       Left hand: decreased sensation noted. Decreased sensation is present in the medial distribution.  Neurological: She is alert and oriented to person, place, and time.  Skin: Skin is warm and dry.  Psychiatric: She has a normal mood and affect.     Assessment/Plan Left carpal tunnel syndrome 1) to OR for left open carpal tunnel release.  Kathryne Hitch 05/28/2011, 12:02 PM

## 2011-05-28 NOTE — Anesthesia Preprocedure Evaluation (Signed)
Anesthesia Evaluation  Patient identified by MRN, date of birth, ID band Patient awake    Reviewed: Allergy & Precautions, H&P , NPO status , Patient's Chart, lab work & pertinent test results  Airway Mallampati: II TM Distance: >3 FB Neck ROM: Full    Dental No notable dental hx. (+) Teeth Intact   Pulmonary neg pulmonary ROS,  clear to auscultation  Pulmonary exam normal       Cardiovascular neg cardio ROS Regular Normal    Neuro/Psych PSYCHIATRIC DISORDERS  Neuromuscular disease Negative Neurological ROS  Negative Psych ROS   GI/Hepatic negative GI ROS, Neg liver ROS,   Endo/Other  Negative Endocrine ROS  Renal/GU negative Renal ROS  Genitourinary negative   Musculoskeletal negative musculoskeletal ROS (+)   Abdominal (+) obese,   Peds negative pediatric ROS (+)  Hematology negative hematology ROS (+)   Anesthesia Other Findings   Reproductive/Obstetrics negative OB ROS                           Anesthesia Physical Anesthesia Plan  ASA: I  Anesthesia Plan: General   Post-op Pain Management:    Induction: Intravenous  Airway Management Planned: LMA  Additional Equipment:   Intra-op Plan:   Post-operative Plan: Extubation in OR  Informed Consent: I have reviewed the patients History and Physical, chart, labs and discussed the procedure including the risks, benefits and alternatives for the proposed anesthesia with the patient or authorized representative who has indicated his/her understanding and acceptance.   Dental advisory given  Plan Discussed with: CRNA  Anesthesia Plan Comments:         Anesthesia Quick Evaluation

## 2011-05-28 NOTE — Anesthesia Postprocedure Evaluation (Signed)
  Anesthesia Post-op Note  Patient: Candace [name]  Procedure(s) Performed:  CARPAL TUNNEL RELEASE  Patient Location: PACU  Anesthesia Type: General  Level of Consciousness: awake and alert   Airway and Oxygen Therapy: Patient Spontanous Breathing  Post-op Pain: mild  Post-op Assessment: Post-op Vital signs reviewed, Patient's Cardiovascular Status Stable, Respiratory Function Stable, Patent Airway and No signs of Nausea or vomiting  Post-op Vital Signs: stable  Complications: No apparent anesthesia complications

## 2011-05-28 NOTE — Transfer of Care (Signed)
Immediate Anesthesia Transfer of Care Note  Patient: Candace [name]  Procedure(s) Performed:  CARPAL TUNNEL RELEASE  Patient Location: PACU  Anesthesia Type: General  Level of Consciousness: awake, sedated and patient cooperative  Airway & Oxygen Therapy: Patient Spontanous Breathing and Patient connected to face mask oxygen  Post-op Assessment: Report given to PACU RN and Post -op Vital signs reviewed and stable  Post vital signs: Reviewed and stable  Complications: No apparent anesthesia complications

## 2011-05-30 ENCOUNTER — Encounter (HOSPITAL_COMMUNITY): Payer: Self-pay | Admitting: Orthopaedic Surgery

## 2012-01-08 ENCOUNTER — Other Ambulatory Visit: Payer: Self-pay | Admitting: Gastroenterology

## 2012-03-02 ENCOUNTER — Encounter (HOSPITAL_COMMUNITY): Payer: Self-pay | Admitting: *Deleted

## 2012-03-02 ENCOUNTER — Emergency Department (HOSPITAL_COMMUNITY)
Admission: EM | Admit: 2012-03-02 | Discharge: 2012-03-02 | Disposition: A | Discharge status: Home / Self Care | Payer: 59 | Source: Home / Self Care | Attending: Emergency Medicine | Admitting: Emergency Medicine

## 2012-03-02 DIAGNOSIS — H01009 Unspecified blepharitis unspecified eye, unspecified eyelid: Secondary | ICD-10-CM

## 2012-03-02 DIAGNOSIS — S0501XA Injury of conjunctiva and corneal abrasion without foreign body, right eye, initial encounter: Secondary | ICD-10-CM

## 2012-03-02 DIAGNOSIS — H109 Unspecified conjunctivitis: Secondary | ICD-10-CM

## 2012-03-02 DIAGNOSIS — S058X9A Other injuries of unspecified eye and orbit, initial encounter: Secondary | ICD-10-CM

## 2012-03-02 HISTORY — DX: Other seasonal allergic rhinitis: J30.2

## 2012-03-02 MED ORDER — MOXIFLOXACIN HCL 0.5 % OP SOLN: 1.0000 [drp] | Freq: Three times a day (TID) | OPHTHALMIC | Status: AC

## 2012-03-02 MED ORDER — OLOPATADINE HCL 0.1 % OP SOLN: 1.0000 [drp] | Freq: Two times a day (BID) | OPHTHALMIC | Status: DC

## 2012-03-02 MED ORDER — TETRACAINE HCL 0.5 % OP SOLN
OPHTHALMIC | Status: AC
  Filled 2012-03-02: qty 2

## 2012-03-02 MED ORDER — POLYETHYL GLYCOL-PROPYL GLYCOL 0.4-0.3 % OP SOLN: 1.0000 [drp] | Freq: Four times a day (QID) | OPHTHALMIC | Status: DC | PRN

## 2012-03-02 MED ORDER — KETOROLAC TROMETHAMINE 0.5 % OP SOLN: OPHTHALMIC | Status: AC

## 2012-03-02 NOTE — ED Provider Notes (Signed)
History     CSN: [id number]  Arrival date & time 03/02/12  1744   First MD Initiated Contact with Patient 03/02/12 1808      Chief Complaint  Patient presents with  . Conjunctivitis    (Consider location/radiation/quality/duration/timing/severity/associated sxs/prior treatment) HPI Comments: Patient with a history of seasonal allergies reports bilateral eye redness, scant crusty discharge, itching for the past 2-3 days. She wears contacts. She is currently not taking any allergy medications.  She reports increased tearing. No nausea, vomiting, visual changes. She denies any new contact lens solution, eye drops, lotions, soaps, exposure to chemicals. No known sick contacts. No trauma to the eye. No headache. No history of hypertension.  ROS as noted in HPI. All other ROS negative.   Patient is a 51 y.o. female presenting with conjunctivitis. The history is provided by the patient.  Conjunctivitis  The current episode started 3 to 5 days ago. The onset was sudden. The problem has been unchanged. Nothing relieves the symptoms. Nothing aggravates the symptoms. Associated symptoms include eye redness. Pertinent negatives include no fever, no decreased vision, no double vision, no eye itching, no photophobia, no nausea, no vomiting, no congestion, no ear discharge, no headaches, no rhinorrhea, no URI and no eye pain. The eye pain is not associated with movement. The eyelid exhibits no abnormality.    Past Medical History  Diagnosis Date  . Depression   . Seasonal allergies     Past Surgical History  Procedure Date  . Tonsillectomy     as child  . Carpal tunnel release 03/26/2011    right hand  . Uterine ablation 06/04/2010  . Trigger fingers 8 yrs. ago    both done months apart  . Carpal tunnel release 05/28/2011    Procedure: CARPAL TUNNEL RELEASE;  Surgeon: Kathryne Hitch;  Location: [facility];  Service: Orthopedics;  Laterality: Left;    No family history on  file.  History  Substance Use Topics  . Smoking status: Never Smoker   . Smokeless tobacco: Not on file  . Alcohol Use: No    OB History    Grav Para Term Preterm Abortions TAB SAB Ect Mult Living                  Review of Systems  Constitutional: Negative for fever.  HENT: Negative for congestion, rhinorrhea and ear discharge.   Eyes: Positive for redness. Negative for double vision, photophobia, pain and itching.  Gastrointestinal: Negative for nausea and vomiting.  Neurological: Negative for headaches.    Allergies  Review of patient's allergies indicates no known allergies.  Home Medications   Current Outpatient Rx  Name Route Sig Dispense Refill  . CETIRIZINE HCL 10 MG PO TABS Oral Take 10 mg by mouth daily.      [name] FLUOXETINE HCL 10 MG PO CAPS Oral Take 10 mg by mouth daily. Pt takes at lunch    . THERA M PLUS PO TABS Oral Take 1 tablet by mouth daily.     . BUPROPION HCL ER (XL) 300 MG PO TB24 Oral Take 300 mg by mouth every morning.     [name] KETOROLAC TROMETHAMINE 0.5 % OP SOLN  1 drop in affected eye 4 times a day x 5 days 3 mL 0  . MOXIFLOXACIN HCL 0.5 % OP SOLN Both Eyes Place 1 drop into both eyes 3 (three) times daily. X 7 days 3 mL 0  . OLOPATADINE HCL 0.1 % OP SOLN Both Eyes Place  1 drop into both eyes 2 (two) times daily. 5 mL 0  . POLYETHYL GLYCOL-PROPYL GLYCOL 0.4-0.3 % OP SOLN Ophthalmic Apply 1 drop to eye 4 (four) times daily as needed. 5 mL 0    BP 139/79  Pulse 77  Temp 98.4 F (36.9 C) (Oral)  Resp 16  SpO2 98%  Physical Exam  Nursing note and vitals reviewed. Constitutional: She is oriented to person, place, and time. She appears well-developed and well-nourished. No distress.  HENT:  Head: Normocephalic and atraumatic.  Eyes: EOM are normal. Pupils are equal, round, and reactive to light. No foreign bodies found. Right conjunctiva is injected. Left conjunctiva is injected.  Slit lamp exam:      The right eye shows corneal abrasion and  fluorescein uptake. The right eye shows no corneal flare, no corneal ulcer, no foreign body, no hyphema and no hypopyon.       The left eye shows no corneal abrasion, no corneal flare, no corneal ulcer, no foreign body, no hyphema, no hypopyon and no fluorescein uptake.         Visual acuity right eye: 20/50 left eye 20/40 both 20/25. Multiple blocked pores of upper and lower eyelids. Faint Corneal abrasion right side, see drawing   Neck: Normal range of motion.  Cardiovascular: Normal rate.   Pulmonary/Chest: Effort normal.  Abdominal: She exhibits no distension.  Musculoskeletal: Normal range of motion.  Neurological: She is alert and oriented to person, place, and time. Coordination normal.  Skin: Skin is warm and dry.  Psychiatric: She has a normal mood and affect. Her behavior is normal. Judgment and thought content normal.    ED Course  Procedures (including critical care time)  Labs Reviewed - No data to display No results found.   1. Conjunctivitis   2. Blepharitis   3. Corneal abrasion, right     MDM  Patient appears to have a blepharitis, conjunctivitis from allergies. Will start her on Patanol, ketorolac, Systane eyedrops. Will have her start doing more compresses, lid hygiene for the blepharitis. Also with faint corneal abrasion right eye, not sure if this is new. Has remote history of trauma to the site. Will send her home with Vigamox for this. She will followup with her ophthalmologist in 2 days. Discussed signs symptoms that should prompt return to the department. Patient agrees with plan.  Luiz Blare, MD 03/02/12 [phone]

## 2012-03-02 NOTE — ED Notes (Signed)
Believes she has pink eye - c/o bilat eye redness, pruritis, crusting, and drainage x 3 days.  Has been wearing her eye glasses (not contact lenses).  Was told this summer by eye dr that she "probably has allergies".

## 2012-04-06 ENCOUNTER — Ambulatory Visit: Payer: Self-pay | Admitting: Obstetrics and Gynecology

## 2012-04-09 DIAGNOSIS — N915 Oligomenorrhea, unspecified: Secondary | ICD-10-CM | POA: Insufficient documentation

## 2012-04-09 DIAGNOSIS — B977 Papillomavirus as the cause of diseases classified elsewhere: Secondary | ICD-10-CM | POA: Insufficient documentation

## 2012-04-09 DIAGNOSIS — N840 Polyp of corpus uteri: Secondary | ICD-10-CM | POA: Insufficient documentation

## 2012-04-09 DIAGNOSIS — L720 Epidermal cyst: Secondary | ICD-10-CM | POA: Insufficient documentation

## 2012-04-09 DIAGNOSIS — IMO0001 Reserved for inherently not codable concepts without codable children: Secondary | ICD-10-CM | POA: Insufficient documentation

## 2012-04-09 DIAGNOSIS — G4700 Insomnia, unspecified: Secondary | ICD-10-CM | POA: Insufficient documentation

## 2012-04-09 DIAGNOSIS — Z8719 Personal history of other diseases of the digestive system: Secondary | ICD-10-CM | POA: Insufficient documentation

## 2012-04-09 DIAGNOSIS — N970 Female infertility associated with anovulation: Secondary | ICD-10-CM | POA: Insufficient documentation

## 2012-04-09 DIAGNOSIS — N8500 Endometrial hyperplasia, unspecified: Secondary | ICD-10-CM | POA: Insufficient documentation

## 2012-04-09 DIAGNOSIS — F32A Depression, unspecified: Secondary | ICD-10-CM | POA: Insufficient documentation

## 2012-04-09 DIAGNOSIS — F329 Major depressive disorder, single episode, unspecified: Secondary | ICD-10-CM | POA: Insufficient documentation

## 2012-04-09 DIAGNOSIS — Z8742 Personal history of other diseases of the female genital tract: Secondary | ICD-10-CM | POA: Insufficient documentation

## 2012-04-15 ENCOUNTER — Ambulatory Visit (INDEPENDENT_AMBULATORY_CARE_PROVIDER_SITE_OTHER): Payer: 59 | Admitting: Obstetrics and Gynecology

## 2012-04-15 ENCOUNTER — Encounter: Payer: Self-pay | Admitting: Obstetrics and Gynecology

## 2012-04-15 VITALS — BP 112/64 | Ht 67.0 in | Wt 214.0 lb

## 2012-04-15 DIAGNOSIS — G4700 Insomnia, unspecified: Secondary | ICD-10-CM

## 2012-04-15 DIAGNOSIS — Z124 Encounter for screening for malignant neoplasm of cervix: Secondary | ICD-10-CM

## 2012-04-15 NOTE — Progress Notes (Signed)
ANNUAL:  Last Pap: 04/03/2011 WNL: Yes Regular Periods:no  Ablation Contraception: none  Monthly Breast exam:no Tetanus<65yrs:yes Nl.Bladder Function:yes Daily BMs:yes Healthy Diet:yes Calcium:no Mammogram:yes Date of Mammogram: 02/05/2011 Exercise:yes Have often Exercise: 4 times weekly Seatbelt: yes Abuse at home: no Stressful work:no Sigmoid-colonoscopy: 2013 Bone Density: No PCP: Laurann Montana Change in PMH: None Change in [facility]: None  Subjective:    Candace [name] is a 51 y.o. female [phone] who presents for annual exam.  The patient has no complaints today. No menses since ablation  The following portions of the patient's history were reviewed and updated as appropriate: allergies, current medications, past family history, past medical history, past social history, past surgical history and problem list.  Review of Systems Pertinent items are noted in HPI. Gastrointestinal:No change in bowel habits, no abdominal pain, no rectal bleeding Genitourinary:negative for dysuria, frequency, hematuria, nocturia and urinary incontinence    Objective:     BP 112/64  Ht 5\' 7"  (1.702 m)  Wt 214 lb (97.07 kg)  BMI 33.52 kg/m2  Weight:  Wt Readings from Last 1 Encounters:  04/15/12 214 lb (97.07 kg)     BMI: Body mass index is 33.52 kg/(m^2). General Appearance: Alert, appropriate appearance for age. No acute distress HEENT: Grossly normal Neck / Thyroid: Supple, no masses, nodes or enlargement Lungs: clear to auscultation bilaterally Back: No CVA tenderness Breast Exam: No masses or nodes.No dimpling, nipple retraction or discharge. Cardiovascular: Regular rate and rhythm. S1, S2, no murmur Gastrointestinal: Soft, non-tender, no masses or organomegaly Pelvic Exam: External genitalia: normal general appearance Vaginal: normal mucosa without prolapse or lesions Cervix: normal appearance Adnexa: non palpable Uterus: doesnt feel enlarged Exam limited by body  habitus Rectovaginal: normal rectal, no masses Lymphatic Exam: Non-palpable nodes in neck, clavicular, axillary, or inguinal regions Skin: no rash or abnormalities Neurologic: Normal gait and speech, no tremor  Psychiatric: Alert and oriented, appropriate affect.    Urinalysis:Not done    Assessment:    Dysfunctional uterine bleeding  resolved s/p ablation   Plan:   mammogram pap smear due 2015 Declines contraception.  Not sexually active Follow-up:  for annual exam   Dierdre Forth MD

## 2012-11-19 ENCOUNTER — Other Ambulatory Visit: Payer: Self-pay | Admitting: Obstetrics and Gynecology

## 2012-11-19 DIAGNOSIS — Z1231 Encounter for screening mammogram for malignant neoplasm of breast: Secondary | ICD-10-CM

## 2012-11-20 ENCOUNTER — Ambulatory Visit (HOSPITAL_COMMUNITY)
Admission: RE | Admit: 2012-11-20 | Discharge: 2012-11-20 | Disposition: A | Discharge status: Home / Self Care | Payer: 59 | Source: Ambulatory Visit | Attending: Obstetrics and Gynecology | Admitting: Obstetrics and Gynecology

## 2012-11-20 DIAGNOSIS — Z1231 Encounter for screening mammogram for malignant neoplasm of breast: Secondary | ICD-10-CM

## 2013-01-24 ENCOUNTER — Encounter (HOSPITAL_COMMUNITY): Admission: EM | Disposition: A | Discharge status: Home / Self Care | Payer: Self-pay | Source: Home / Self Care

## 2013-01-24 ENCOUNTER — Encounter (HOSPITAL_COMMUNITY): Payer: Self-pay | Admitting: Emergency Medicine

## 2013-01-24 ENCOUNTER — Emergency Department (HOSPITAL_COMMUNITY): Payer: 59

## 2013-01-24 ENCOUNTER — Inpatient Hospital Stay (HOSPITAL_COMMUNITY): Payer: 59

## 2013-01-24 ENCOUNTER — Inpatient Hospital Stay (HOSPITAL_COMMUNITY)
Admission: EM | Admit: 2013-01-24 | Discharge: 2013-01-25 | DRG: 419 | Disposition: A | Discharge status: Home / Self Care | Payer: 59 | Attending: General Surgery | Admitting: General Surgery

## 2013-01-24 ENCOUNTER — Inpatient Hospital Stay (HOSPITAL_COMMUNITY): Payer: 59 | Admitting: Critical Care Medicine

## 2013-01-24 ENCOUNTER — Encounter (HOSPITAL_COMMUNITY): Payer: Self-pay | Admitting: Critical Care Medicine

## 2013-01-24 DIAGNOSIS — K8010 Calculus of gallbladder with chronic cholecystitis without obstruction: Secondary | ICD-10-CM

## 2013-01-24 DIAGNOSIS — F3289 Other specified depressive episodes: Secondary | ICD-10-CM | POA: Diagnosis present

## 2013-01-24 DIAGNOSIS — F329 Major depressive disorder, single episode, unspecified: Secondary | ICD-10-CM | POA: Diagnosis present

## 2013-01-24 DIAGNOSIS — K810 Acute cholecystitis: Secondary | ICD-10-CM

## 2013-01-24 DIAGNOSIS — G4700 Insomnia, unspecified: Secondary | ICD-10-CM | POA: Diagnosis present

## 2013-01-24 DIAGNOSIS — K8000 Calculus of gallbladder with acute cholecystitis without obstruction: Principal | ICD-10-CM | POA: Diagnosis present

## 2013-01-24 HISTORY — PX: CHOLECYSTECTOMY: SHX55

## 2013-01-24 LAB — CBC WITH DIFFERENTIAL/PLATELET
Basophils Absolute: 0 10*3/uL (ref 0.0–0.1)
Basophils Relative: 0 % (ref 0–1)
Eosinophils Absolute: 0.1 10*3/uL (ref 0.0–0.7)
HCT: 41.8 % (ref 36.0–46.0)
Hemoglobin: 14.6 g/dL (ref 12.0–15.0)
MCH: 29.9 pg (ref 26.0–34.0)
MCHC: 34.9 g/dL (ref 30.0–36.0)
Monocytes Absolute: 1 10*3/uL (ref 0.1–1.0)
Monocytes Relative: 8 % (ref 3–12)
RDW: 12.4 % (ref 11.5–15.5)

## 2013-01-24 LAB — URINALYSIS, ROUTINE W REFLEX MICROSCOPIC
Ketones, ur: 15 mg/dL — AB
Leukocytes, UA: NEGATIVE
Nitrite: NEGATIVE
Specific Gravity, Urine: 1.021 (ref 1.005–1.030)
pH: 7.5 (ref 5.0–8.0)

## 2013-01-24 LAB — COMPREHENSIVE METABOLIC PANEL
AST: 27 U/L (ref 0–37)
Albumin: 4.2 g/dL (ref 3.5–5.2)
BUN: 16 mg/dL (ref 6–23)
Calcium: 10.3 mg/dL (ref 8.4–10.5)
Creatinine, Ser: 0.81 mg/dL (ref 0.50–1.10)
Total Protein: 7.5 g/dL (ref 6.0–8.3)

## 2013-01-24 LAB — URINE MICROSCOPIC-ADD ON

## 2013-01-24 LAB — LIPASE, BLOOD: Lipase: 55 U/L (ref 11–59)

## 2013-01-24 SURGERY — LAPAROSCOPIC CHOLECYSTECTOMY WITH INTRAOPERATIVE CHOLANGIOGRAM
Anesthesia: General | Site: Abdomen | Wound class: Clean

## 2013-01-24 MED ORDER — PROPOFOL 10 MG/ML IV BOLUS
INTRAVENOUS | Status: DC | PRN
  Administered 2013-01-24: 40 mg via INTRAVENOUS
  Administered 2013-01-24: 200 mg via INTRAVENOUS

## 2013-01-24 MED ORDER — MORPHINE SULFATE 4 MG/ML IJ SOLN
4.0000 mg | INTRAMUSCULAR | Status: DC | PRN
  Filled 2013-01-24: qty 1

## 2013-01-24 MED ORDER — ENOXAPARIN SODIUM 40 MG/0.4ML ~~LOC~~ SOLN
40.0000 mg | SUBCUTANEOUS | Status: DC
  Administered 2013-01-24: 40 mg via SUBCUTANEOUS
  Filled 2013-01-24 (×2): qty 0.4

## 2013-01-24 MED ORDER — HYDROMORPHONE HCL PF 1 MG/ML IJ SOLN
1.0000 mg | INTRAMUSCULAR | Status: DC | PRN
  Administered 2013-01-25 (×2): 1 mg via INTRAVENOUS
  Filled 2013-01-24 (×2): qty 1

## 2013-01-24 MED ORDER — PIPERACILLIN-TAZOBACTAM 3.375 G IVPB 30 MIN
3.3750 g | Freq: Once | INTRAVENOUS | Status: AC
  Administered 2013-01-24: 3.375 g via INTRAVENOUS
  Filled 2013-01-24: qty 50

## 2013-01-24 MED ORDER — ONDANSETRON HCL 4 MG/2ML IJ SOLN: 4.0000 mg | Freq: Once | INTRAMUSCULAR | Status: DC | PRN

## 2013-01-24 MED ORDER — LIDOCAINE HCL (CARDIAC) 20 MG/ML IV SOLN
INTRAVENOUS | Status: DC | PRN
  Administered 2013-01-24: 40 mg via INTRAVENOUS

## 2013-01-24 MED ORDER — HYDROMORPHONE HCL PF 1 MG/ML IJ SOLN: 1.0000 mg | INTRAMUSCULAR | Status: DC | PRN

## 2013-01-24 MED ORDER — MORPHINE SULFATE 4 MG/ML IJ SOLN
4.0000 mg | Freq: Once | INTRAMUSCULAR | Status: AC
  Administered 2013-01-24: 4 mg via INTRAVENOUS
  Filled 2013-01-24: qty 1

## 2013-01-24 MED ORDER — ONDANSETRON HCL 4 MG/2ML IJ SOLN
INTRAMUSCULAR | Status: DC | PRN
  Administered 2013-01-24: 4 mg via INTRAVENOUS

## 2013-01-24 MED ORDER — ROCURONIUM BROMIDE 100 MG/10ML IV SOLN
INTRAVENOUS | Status: DC | PRN
  Administered 2013-01-24: 30 mg via INTRAVENOUS

## 2013-01-24 MED ORDER — SODIUM CHLORIDE 0.9 % IR SOLN
Status: DC | PRN
  Administered 2013-01-24: 1000 mL

## 2013-01-24 MED ORDER — LACTATED RINGERS IV SOLN
INTRAVENOUS | Status: DC | PRN
  Administered 2013-01-24 (×2): via INTRAVENOUS

## 2013-01-24 MED ORDER — BUPIVACAINE-EPINEPHRINE PF 0.25-1:200000 % IJ SOLN
INTRAMUSCULAR | Status: AC
  Filled 2013-01-24: qty 30

## 2013-01-24 MED ORDER — PROMETHAZINE HCL 25 MG/ML IJ SOLN
INTRAMUSCULAR | Status: AC
  Filled 2013-01-24: qty 1

## 2013-01-24 MED ORDER — ONDANSETRON HCL 4 MG/2ML IJ SOLN: 4.0000 mg | Freq: Four times a day (QID) | INTRAMUSCULAR | Status: DC | PRN

## 2013-01-24 MED ORDER — ONDANSETRON HCL 4 MG/2ML IJ SOLN
4.0000 mg | Freq: Once | INTRAMUSCULAR | Status: AC
  Administered 2013-01-24: 4 mg via INTRAVENOUS
  Filled 2013-01-24: qty 2

## 2013-01-24 MED ORDER — PIPERACILLIN-TAZOBACTAM 3.375 G IVPB
3.3750 g | Freq: Three times a day (TID) | INTRAVENOUS | Status: DC
  Administered 2013-01-24 – 2013-01-25 (×2): 3.375 g via INTRAVENOUS
  Filled 2013-01-24 (×6): qty 50

## 2013-01-24 MED ORDER — SODIUM CHLORIDE 0.9 % IV SOLN
INTRAVENOUS | Status: DC | PRN
  Administered 2013-01-24: 16:00:00

## 2013-01-24 MED ORDER — OXYCODONE-ACETAMINOPHEN 5-325 MG PO TABS: 1.0000 | ORAL_TABLET | ORAL | Status: DC | PRN

## 2013-01-24 MED ORDER — HYDROMORPHONE HCL PF 1 MG/ML IJ SOLN
0.2500 mg | INTRAMUSCULAR | Status: DC | PRN
  Administered 2013-01-24 (×2): 0.5 mg via INTRAVENOUS

## 2013-01-24 MED ORDER — BUPIVACAINE-EPINEPHRINE 0.25% -1:200000 IJ SOLN
INTRAMUSCULAR | Status: DC | PRN
  Administered 2013-01-24: 10 mL

## 2013-01-24 MED ORDER — PROMETHAZINE HCL 25 MG/ML IJ SOLN
6.2500 mg | Freq: Once | INTRAMUSCULAR | Status: AC
  Administered 2013-01-24: 6.25 mg via INTRAVENOUS

## 2013-01-24 MED ORDER — POTASSIUM CHLORIDE IN NACL 20-0.9 MEQ/L-% IV SOLN
INTRAVENOUS | Status: DC
  Administered 2013-01-24: 08:00:00 via INTRAVENOUS
  Filled 2013-01-24 (×4): qty 1000

## 2013-01-24 MED ORDER — HYDROMORPHONE HCL PF 1 MG/ML IJ SOLN
INTRAMUSCULAR | Status: AC
  Filled 2013-01-24: qty 1

## 2013-01-24 MED ORDER — POTASSIUM CHLORIDE IN NACL 20-0.9 MEQ/L-% IV SOLN
INTRAVENOUS | Status: DC
  Administered 2013-01-25: 06:00:00 via INTRAVENOUS
  Filled 2013-01-24 (×3): qty 1000

## 2013-01-24 MED ORDER — FENTANYL CITRATE 0.05 MG/ML IJ SOLN
INTRAMUSCULAR | Status: DC | PRN
  Administered 2013-01-24: 100 ug via INTRAVENOUS
  Administered 2013-01-24: 50 ug via INTRAVENOUS
  Administered 2013-01-24: 150 ug via INTRAVENOUS
  Administered 2013-01-24: 100 ug via INTRAVENOUS

## 2013-01-24 MED ORDER — MIDAZOLAM HCL 5 MG/5ML IJ SOLN
INTRAMUSCULAR | Status: DC | PRN
  Administered 2013-01-24: 2 mg via INTRAVENOUS

## 2013-01-24 SURGICAL SUPPLY — 44 items
APL SKNCLS STERI-STRIP NONHPOA (GAUZE/BANDAGES/DRESSINGS) ×1
APPLIER CLIP ROT 10 11.4 M/L (STAPLE) ×2
APR CLP MED LRG 11.4X10 (STAPLE) ×1
BAG SPEC RTRVL LRG 6X4 10 (ENDOMECHANICALS) ×1
BENZOIN TINCTURE PRP APPL 2/3 (GAUZE/BANDAGES/DRESSINGS) ×2 IMPLANT
BLADE SURG ROTATE 9660 (MISCELLANEOUS) IMPLANT
CANISTER SUCTION 2500CC (MISCELLANEOUS) ×2 IMPLANT
CHLORAPREP W/TINT 26ML (MISCELLANEOUS) ×2 IMPLANT
CLIP APPLIE ROT 10 11.4 M/L (STAPLE) ×1 IMPLANT
CLOTH BEACON ORANGE TIMEOUT ST (SAFETY) ×2 IMPLANT
CLSR STERI-STRIP ANTIMIC 1/2X4 (GAUZE/BANDAGES/DRESSINGS) ×1 IMPLANT
COVER MAYO STAND STRL (DRAPES) ×2 IMPLANT
COVER SURGICAL LIGHT HANDLE (MISCELLANEOUS) ×2 IMPLANT
DECANTER SPIKE VIAL GLASS SM (MISCELLANEOUS) ×4 IMPLANT
DRAPE C-ARM 42X72 X-RAY (DRAPES) ×2 IMPLANT
DRAPE UTILITY 15X26 W/TAPE STR (DRAPE) ×4 IMPLANT
DRSG TEGADERM 2-3/8X2-3/4 SM (GAUZE/BANDAGES/DRESSINGS) ×3 IMPLANT
DRSG TEGADERM 4X4.75 (GAUZE/BANDAGES/DRESSINGS) ×2 IMPLANT
ELECT REM PT RETURN 9FT ADLT (ELECTROSURGICAL) ×2
ELECTRODE REM PT RTRN 9FT ADLT (ELECTROSURGICAL) ×1 IMPLANT
FILTER SMOKE EVAC LAPAROSHD (FILTER) ×2 IMPLANT
GAUZE SPONGE 2X2 8PLY STRL LF (GAUZE/BANDAGES/DRESSINGS) ×1 IMPLANT
GLOVE BIO SURGEON STRL SZ7 (GLOVE) ×2 IMPLANT
GLOVE BIOGEL PI IND STRL 7.5 (GLOVE) ×1 IMPLANT
GLOVE BIOGEL PI INDICATOR 7.5 (GLOVE) ×1
GOWN STRL NON-REIN LRG LVL3 (GOWN DISPOSABLE) ×8 IMPLANT
KIT BASIN OR (CUSTOM PROCEDURE TRAY) ×2 IMPLANT
KIT ROOM TURNOVER OR (KITS) ×2 IMPLANT
NS IRRIG 1000ML POUR BTL (IV SOLUTION) ×2 IMPLANT
PAD ARMBOARD 7.5X6 YLW CONV (MISCELLANEOUS) ×2 IMPLANT
POUCH SPECIMEN RETRIEVAL 10MM (ENDOMECHANICALS) ×2 IMPLANT
SCISSORS LAP 5X35 DISP (ENDOMECHANICALS) IMPLANT
SET CHOLANGIOGRAPH 5 50 .035 (SET/KITS/TRAYS/PACK) ×2 IMPLANT
SET IRRIG TUBING LAPAROSCOPIC (IRRIGATION / IRRIGATOR) ×2 IMPLANT
SLEEVE ENDOPATH XCEL 5M (ENDOMECHANICALS) ×2 IMPLANT
SPECIMEN JAR SMALL (MISCELLANEOUS) ×2 IMPLANT
SPONGE GAUZE 2X2 STER 10/PKG (GAUZE/BANDAGES/DRESSINGS) ×1
SUT MNCRL AB 4-0 PS2 18 (SUTURE) ×2 IMPLANT
TOWEL OR 17X24 6PK STRL BLUE (TOWEL DISPOSABLE) ×2 IMPLANT
TOWEL OR 17X26 10 PK STRL BLUE (TOWEL DISPOSABLE) ×2 IMPLANT
TRAY LAPAROSCOPIC (CUSTOM PROCEDURE TRAY) ×2 IMPLANT
TROCAR XCEL BLUNT TIP 100MML (ENDOMECHANICALS) ×2 IMPLANT
TROCAR XCEL NON-BLD 11X100MML (ENDOMECHANICALS) ×2 IMPLANT
TROCAR XCEL NON-BLD 5MMX100MML (ENDOMECHANICALS) ×2 IMPLANT

## 2013-01-24 NOTE — ED Notes (Signed)
PT. REPORTS RIGHT ABDOMINAL PAIN RADIATING TO RIGHT BACK WITH NAUSEA AND VOMITTING ONSET THIS EVENING , NO DIARRHEA , FEVER OR CHILLS , DENIES HEMATURIA OR DYSURIA.

## 2013-01-24 NOTE — Transfer of Care (Signed)
Immediate Anesthesia Transfer of Care Note  Patient: Candace [name]  Procedure(s) Performed: Procedure(s): LAPAROSCOPIC CHOLECYSTECTOMY WITH INTRAOPERATIVE CHOLANGIOGRAM (N/A)  Patient Location: PACU  Anesthesia Type:General  Level of Consciousness: sedated  Airway & Oxygen Therapy: Patient Spontanous Breathing and Patient connected to nasal cannula oxygen  Post-op Assessment: Report given to PACU RN and Post -op Vital signs reviewed and stable  Post vital signs: Reviewed and stable  Complications: No apparent anesthesia complications

## 2013-01-24 NOTE — Anesthesia Postprocedure Evaluation (Signed)
  Anesthesia Post-op Note  Patient: Candace [name]  Procedure(s) Performed: Procedure(s): LAPAROSCOPIC CHOLECYSTECTOMY WITH INTRAOPERATIVE CHOLANGIOGRAM (N/A)  Patient Location: PACU  Anesthesia Type:General  Level of Consciousness: awake, alert , oriented and patient cooperative  Airway and Oxygen Therapy: Patient Spontanous Breathing  Post-op Pain: mild  Post-op Assessment: Post-op Vital signs reviewed, Patient's Cardiovascular Status Stable, Respiratory Function Stable, Patent Airway, NAUSEA AND VOMITING PRESENT and Pain level controlled  Post-op Vital Signs: stable  Complications: No apparent anesthesia complications

## 2013-01-24 NOTE — Op Note (Signed)
Laparoscopic Cholecystectomy with IOC Procedure Note  Indications: This patient presents with symptomatic gallbladder disease and will undergo laparoscopic cholecystectomy.  Pre-operative Diagnosis: Calculus of gallbladder with acute cholecystitis, without mention of obstruction  Post-operative Diagnosis: Same  Surgeon: Jenah Vanasten K.   Assistants: none  Anesthesia: General endotracheal anesthesia  ASA Class: 2E  Procedure Details  The patient was seen again in the Holding Room. The risks, benefits, complications, treatment options, and expected outcomes were discussed with the patient. The possibilities of reaction to medication, pulmonary aspiration, perforation of viscus, bleeding, recurrent infection, finding a normal gallbladder, the need for additional procedures, failure to diagnose a condition, the possible need to convert to an open procedure, and creating a complication requiring transfusion or operation were discussed with the patient. The likelihood of improving the patient's symptoms with return to their baseline status is good.  The patient and/or family concurred with the proposed plan, giving informed consent. The site of surgery properly noted. The patient was taken to Operating Room, identified as KATHI DOHN and the procedure verified as Laparoscopic Cholecystectomy with Intraoperative Cholangiogram. A Time Out was held and the above information confirmed.  Prior to the induction of general anesthesia, antibiotic prophylaxis was administered. General endotracheal anesthesia was then administered and tolerated well. After the induction, the abdomen was prepped with Chloraprep and draped in the sterile fashion. The patient was positioned in the supine position.  Local anesthetic agent was injected into the skin near the umbilicus and an incision made. We dissected down to the abdominal fascia with blunt dissection.  The fascia was incised vertically and we entered the  peritoneal cavity bluntly.  A pursestring suture of 0-Vicryl was placed around the fascial opening.  The Hasson cannula was inserted and secured with the stay suture.  Pneumoperitoneum was then created with CO2 and tolerated well without any adverse changes in the patient's vital signs. An 11-mm port was placed in the subxiphoid position.  Two 5-mm ports were placed in the right upper quadrant. All skin incisions were infiltrated with a local anesthetic agent before making the incision and placing the trocars.   We positioned the patient in reverse Trendelenburg, tilted slightly to the patient's left.  The gallbladder was identified and was very edematous.  We attempted to decompress the gallbladder with the suction aspirator.  The fundus was grasped and retracted cephalad. Adhesions were lysed bluntly and with the electrocautery where indicated, taking care not to injure any adjacent organs or viscus. The infundibulum was grasped and retracted laterally, exposing the peritoneum overlying the triangle of Calot. This was then divided and exposed in a blunt fashion. A critical view of the cystic duct and cystic artery was obtained.  The cystic duct was clearly identified and bluntly dissected circumferentially. The cystic duct was ligated with a clip distally.   An incision was made in the cystic duct and the [facility] cholangiogram catheter introduced. The catheter was secured using a clip. A cholangiogram was then obtained which showed good visualization of the distal and proximal biliary tree with no sign of filling defects or obstruction.  Contrast flowed easily into the duodenum. The catheter was then removed.   The cystic duct was then ligated with clips and divided. The cystic artery was identified, dissected free, ligated with clips and divided as well.   The gallbladder was dissected from the liver bed in retrograde fashion with the electrocautery. The gallbladder was removed and placed in an Endocatch sac.  The liver bed was irrigated and  inspected. Hemostasis was achieved with the electrocautery. Copious irrigation was utilized and was repeatedly aspirated until clear.  The gallbladder and Endocatch sac were then removed through the umbilical port site.  The pursestring suture was used to close the umbilical fascia.    We again inspected the right upper quadrant for hemostasis.  Pneumoperitoneum was released as we removed the trocars.  4-0 Monocryl was used to close the skin.   Benzoin, steri-strips, and clean dressings were applied. The patient was then extubated and brought to the recovery room in stable condition. Instrument, sponge, and needle counts were correct at closure and at the conclusion of the case.   Findings: Cholecystitis with Cholelithiasis  Estimated Blood Loss: less than 50 mL         Drains: none         Specimens: Gallbladder           Complications: None; patient tolerated the procedure well.         Disposition: PACU - hemodynamically stable.         Condition: stable  Wilmon Arms. Corliss Skains, MD, [facility]  General/ Trauma Surgery  01/24/2013 4:18 PM

## 2013-01-24 NOTE — Progress Notes (Signed)
ANTIBIOTIC CONSULT NOTE - INITIAL  Pharmacy Consult for Zosyn Indication:  Empiric for GI complaints  No Known Allergies  Patient Measurements: Weight: 97.1KG (2013) Height:  170 CM (2013)  Vital Signs: Temp: 98.1 F (36.7 C) (07/27 0315) Temp src: Oral (07/27 0315) BP: 144/84 mmHg (07/27 0634) Pulse Rate: 91 (07/27 0634)  Labs:  Recent Labs  01/24/13 0316  WBC 12.8*  HGB 14.6  PLT 296  CREATININE 0.81   Medical History: Past Medical History  Diagnosis Date  . Depression   . Seasonal allergies   . Endometrial polyp     h/o  . H/O menorrhagia   . Endometrial hyperplasia   . Oligomenorrhea   . Insomnia   . Anovulation   . Abnormal Pap smear   . H/O hemorrhoids   . Epidermal cyst     h/o  . High risk HPV infection    Medications:  Anti-infectives   None     Assessment: 52 yo female with c/o abdominal pain with nausea and vomiting.  Ultrasound of the abdomen revealed acute cholecystitis.  We have been asked to initiate IV Zosyn for her as empiric coverage.  Her creatinine is 0.81 and she has an estimated renal clearance ~ 48ml/min.  She has some mild leukocytosis with WBC of 12.8K.  Goal of Therapy:  Therapeutic response to IV antibiotics  Plan:  1.  Will begin Zosyn 3.375 gm. IV every 8 hours. 2.  Monitor renal function 3.  F/u culture data  Nadara Mustard, PharmD., MS Clinical Pharmacist Pager:  [phone] Thank you for allowing pharmacy to be part of this patients care team. 01/24/2013,6:45 AM

## 2013-01-24 NOTE — ED Provider Notes (Signed)
CSN: [id number]     Arrival date & time 01/24/13  0244 History     First MD Initiated Contact with Patient 01/24/13 0258     Chief Complaint  Patient presents with  . Abdominal Pain   (Consider location/radiation/quality/duration/timing/severity/associated sxs/prior Treatment) HPI 52 yo female presents to the ER with complaint of RUQ pain radiating to her back associated with n/v.  Sxs started this evening after work.  She reports some milder pain yesterday associated with bloating, but felt better after burping/passing gas.  No fevers or chills.  No prior h/o same.    Past Medical History  Diagnosis Date  . Depression   . Seasonal allergies   . Endometrial polyp     h/o  . H/O menorrhagia   . Endometrial hyperplasia   . Oligomenorrhea   . Insomnia   . Anovulation   . Abnormal Pap smear   . H/O hemorrhoids   . Epidermal cyst     h/o  . High risk HPV infection    Past Surgical History  Procedure Laterality Date  . Tonsillectomy  1967    as child  . Carpal tunnel release  03/26/2011    right hand  . Uterine ablation  06/04/2010  . Trigger fingers  8 yrs. ago    both done months apart  . Carpal tunnel release  05/28/2011    Procedure: CARPAL TUNNEL RELEASE;  Surgeon: Kathryne Hitch;  Location: [facility];  Service: Orthopedics;  Laterality: Left;  . Hysteroscopy    . Polypectomy     Family History  Problem Relation Age of Onset  . Heart attack Maternal Grandmother   . Heart attack Maternal Grandfather   . Heart disease Mother   . Heart disease Maternal Grandmother   . Heart disease Maternal Grandfather   . Hypertension Mother   . Leukemia Paternal Grandmother   . Pulmonary fibrosis Father   . Depression Father    History  Substance Use Topics  . Smoking status: Never Smoker   . Smokeless tobacco: Never Used  . Alcohol Use: No   OB History   Grav Para Term Preterm Abortions TAB SAB Ect Mult Living   3 2   1  1   2      Review of Systems  All other  systems reviewed and are negative.    Allergies  Review of patient's allergies indicates no known allergies.  Home Medications   Current Outpatient Rx  Name  Route  Sig  Dispense  Refill  . buPROPion (WELLBUTRIN XL) 300 MG 24 hr tablet   Oral   Take 300 mg by mouth every morning.          . cetirizine (ZYRTEC) 10 MG tablet   Oral   Take 10 mg by mouth daily.           [name] FLUoxetine (PROZAC) 10 MG capsule   Oral   Take 10 mg by mouth daily. Pt takes at lunch         . Multiple Vitamins-Minerals (MULTIVITAMINS THER. W/MINERALS) TABS   Oral   Take 1 tablet by mouth daily.          [name] olopatadine (PATANOL) 0.1 % ophthalmic solution   Both Eyes   Place 1 drop into both eyes 2 (two) times daily.   5 mL   0   . Polyethyl Glycol-Propyl Glycol (SYSTANE) 0.4-0.3 % SOLN   Ophthalmic   Apply 1 drop to eye 4 (four) times daily  as needed.   5 mL   0    BP 160/109  Pulse 79  Temp(Src) 98.1 F (36.7 C) (Oral)  Resp 23  SpO2 100% Physical Exam  Nursing note and vitals reviewed. Constitutional: She is oriented to person, place, and time. She appears well-developed and well-nourished. She appears distressed (pacing in the room, unable to get comfortable).  HENT:  Head: Normocephalic and atraumatic.  Nose: Nose normal.  Mouth/Throat: Oropharynx is clear and moist.  Eyes: Conjunctivae and EOM are normal. Pupils are equal, round, and reactive to light.  Neck: Normal range of motion. Neck supple. No JVD present. No tracheal deviation present. No thyromegaly present.  Cardiovascular: Normal rate, regular rhythm, normal heart sounds and intact distal pulses.  Exam reveals no gallop and no friction rub.   No murmur heard. Pulmonary/Chest: Effort normal and breath sounds normal. No stridor. No respiratory distress. She has no wheezes. She has no rales. She exhibits no tenderness.  Abdominal: Soft. Bowel sounds are normal. She exhibits no distension and no mass. There is  tenderness (TTP in RUQ, +murphy sign). There is no rebound and no guarding.  Musculoskeletal: Normal range of motion. She exhibits no edema and no tenderness.  Lymphadenopathy:    She has no cervical adenopathy.  Neurological: She is alert and oriented to person, place, and time. She exhibits normal muscle tone. Coordination normal.  Skin: Skin is warm and dry. No rash noted. No erythema. No pallor.  Psychiatric: She has a normal mood and affect. Her behavior is normal. Judgment and thought content normal.    ED Course   Procedures (including critical care time)  Labs Reviewed  CBC WITH DIFFERENTIAL - Abnormal; Notable for the following:    WBC 12.8 (*)    Neutro Abs 9.7 (*)    All other components within normal limits  COMPREHENSIVE METABOLIC PANEL - Abnormal; Notable for the following:    Glucose, Bld 127 (*)    GFR calc non Af Amer 83 (*)    All other components within normal limits  URINALYSIS, ROUTINE W REFLEX MICROSCOPIC - Abnormal; Notable for the following:    APPearance TURBID (*)    Ketones, ur 15 (*)    All other components within normal limits  URINE MICROSCOPIC-ADD ON - Abnormal; Notable for the following:    Squamous Epithelial / LPF FEW (*)    All other components within normal limits  LIPASE, BLOOD   US Abdomen Complete  01/24/2013   *RADIOLOGY REPORT*  Clinical Data:  Right upper quadrant pain.  Nausea and vomiting.  COMPLETE ABDOMINAL ULTRASOUND  Comparison:  None.  Findings:  Gallbladder:  Multiple stones in the dependent portion of gallbladder.  Sludge. Mild diffuse gallbladder wall thickening. Murphy's sign is positive.  Findings are nonspecific but associated with acute cholecystitis in the appropriate clinical setting.  Common bile duct:  Normal caliber with measured diameter at 2.9 mm.  Liver:  There is a heterogeneous mixed echogenic mass in the right lobe of the liver posteriorly.  The mass is measured at about 5 cm diameter.  Color flow Doppler images  demonstrate no flow.  Suggest non emergent follow-up MRI for further characterization of this lesion.  No other focal liver lesions demonstrated.  IVC:  Appears normal.  Pancreas:  No focal abnormality seen.  Spleen:  In length measures 10.1 cm.  Normal parenchymal echotexture.  Right Kidney:  Right kidney measures 11.6 cm length.  No hydronephrosis.  Left Kidney:  Left kidney measures 10.9  cm length.  No hydronephrosis.  Abdominal aorta:  No aneurysm identified.  IMPRESSION: Cholelithiasis with thick walled gallbladder, gallbladder sludge, and positive Murphy's sign.  These features are associated with acute cholecystitis.  Solid appearing hypoechoic mass in the right lobe of the liver. Recommend MRI for further characterization.   Original Report Authenticated By: Burman Nieves, M.D.   1. Acute cholecystitis     MDM  52 yo female with sxs c/w gallbladder disease.  She is feeling better after morphine and zofran.  Will get RUQ [name] for confirmation of gallstones vs cholecystitis.  [name], MD 01/24/13 [phone]

## 2013-01-24 NOTE — Preoperative (Signed)
Beta Blockers   Reason not to administer Beta Blockers:Not Applicable 

## 2013-01-24 NOTE — ED Notes (Addendum)
0330  Received report from off going RN and introduced self to the pt.  Pt is in severe pain that is located in the RQ and the pain radiates to the back  Under the shoulder blade.  Pt is A&O X4.  0530  Husbands Numbers    Home [phone]                                                Cell    [phone]

## 2013-01-24 NOTE — Anesthesia Preprocedure Evaluation (Addendum)
Anesthesia Evaluation  Patient identified by MRN, date of birth, ID band Patient awake    Reviewed: Allergy & Precautions, H&P , NPO status , Patient's Chart, lab work & pertinent test results  Airway Mallampati: II TM Distance: >3 FB Neck ROM: Full    Dental  (+) Dental Advisory Given and Teeth Intact   Pulmonary          Cardiovascular     Neuro/Psych PSYCHIATRIC DISORDERS Depression    GI/Hepatic   Endo/Other  Morbid obesity  Renal/GU      Musculoskeletal   Abdominal   Peds  Hematology   Anesthesia Other Findings   Reproductive/Obstetrics                          Anesthesia Physical Anesthesia Plan  ASA: II  Anesthesia Plan: General   Post-op Pain Management:    Induction: Intravenous  Airway Management Planned: Oral ETT  Additional Equipment:   Intra-op Plan:   Post-operative Plan: Extubation in OR  Informed Consent: I have reviewed the patients History and Physical, chart, labs and discussed the procedure including the risks, benefits and alternatives for the proposed anesthesia with the patient or authorized representative who has indicated his/her understanding and acceptance.   Dental advisory given  Plan Discussed with: Surgeon and Anesthesiologist  Anesthesia Plan Comments:         Anesthesia Quick Evaluation

## 2013-01-24 NOTE — H&P (Signed)
Candace [name] is an 52 y.o. female.   Chief Complaint: Right upper quadrant abdominal pain HPI: This is a very pleasant 52 year old female who presents with a less than 24-hour history of right upper quadrant abdominal pain nausea and vomiting. This occurred after getting off of work. Since receiving pain medication in the emergency department it has improved but has not completely resolved. She has had some milder attacks of similar discomfort but not to this extent and never with nausea and vomiting. The pain as sharp and intermittent. It is not refer any where else. She is otherwise without complaints.  Past Medical History  Diagnosis Date  . Depression   . Seasonal allergies   . Endometrial polyp     h/o  . H/O menorrhagia   . Endometrial hyperplasia   . Oligomenorrhea   . Insomnia   . Anovulation   . Abnormal Pap smear   . H/O hemorrhoids   . Epidermal cyst     h/o  . High risk HPV infection     Past Surgical History  Procedure Laterality Date  . Tonsillectomy  1967    as child  . Carpal tunnel release  03/26/2011    right hand  . Uterine ablation  06/04/2010  . Trigger fingers  8 yrs. ago    both done months apart  . Carpal tunnel release  05/28/2011    Procedure: CARPAL TUNNEL RELEASE;  Surgeon: Kathryne Hitch;  Location: [facility];  Service: Orthopedics;  Laterality: Left;  . Hysteroscopy    . Polypectomy      Family History  Problem Relation Age of Onset  . Heart attack Maternal Grandmother   . Heart attack Maternal Grandfather   . Heart disease Mother   . Heart disease Maternal Grandmother   . Heart disease Maternal Grandfather   . Hypertension Mother   . Leukemia Paternal Grandmother   . Pulmonary fibrosis Father   . Depression Father    Social History:  reports that she has never smoked. She has never used smokeless tobacco. She reports that she does not drink alcohol or use illicit drugs.  Allergies: No Known Allergies   (Not in a hospital  admission)  Results for orders placed during the hospital encounter of 01/24/13 (from the past 48 hour(s))  CBC WITH DIFFERENTIAL     Status: Abnormal   Collection Time    01/24/13  3:16 AM      Result Value Range   WBC 12.8 (*) 4.0 - 10.5 K/uL   RBC 4.88  3.87 - 5.11 MIL/uL   Hemoglobin 14.6  12.0 - 15.0 g/dL   HCT 16.1  09.6 - 04.5 %   MCV 85.7  78.0 - 100.0 fL   MCH 29.9  26.0 - 34.0 pg   MCHC 34.9  30.0 - 36.0 g/dL   RDW 40.9  81.1 - 91.4 %   Platelets 296  150 - 400 K/uL   Neutrophils Relative % 76  43 - 77 %   Neutro Abs 9.7 (*) 1.7 - 7.7 K/uL   Lymphocytes Relative 16  12 - 46 %   Lymphs Abs 2.0  0.7 - 4.0 K/uL   Monocytes Relative 8  3 - 12 %   Monocytes Absolute 1.0  0.1 - 1.0 K/uL   Eosinophils Relative 1  0 - 5 %   Eosinophils Absolute 0.1  0.0 - 0.7 K/uL   Basophils Relative 0  0 - 1 %   Basophils Absolute 0.0  0.0 - 0.1 K/uL  COMPREHENSIVE METABOLIC PANEL     Status: Abnormal   Collection Time    01/24/13  3:16 AM      Result Value Range   Sodium 138  135 - 145 mEq/L   Potassium 3.5  3.5 - 5.1 mEq/L   Chloride 99  96 - 112 mEq/L   CO2 27  19 - 32 mEq/L   Glucose, Bld 127 (*) 70 - 99 mg/dL   BUN 16  6 - 23 mg/dL   Creatinine, Ser 1.61  0.50 - 1.10 mg/dL   Calcium 09.6  8.4 - 04.5 mg/dL   Total Protein 7.5  6.0 - 8.3 g/dL   Albumin 4.2  3.5 - 5.2 g/dL   AST 27  0 - 37 U/L   ALT 35  0 - 35 U/L   Alkaline Phosphatase 76  39 - 117 U/L   Total Bilirubin 0.3  0.3 - 1.2 mg/dL   GFR calc non Af Amer 83 (*) >90 mL/min   GFR calc Af Amer >90  >90 mL/min   Comment:            The eGFR has been calculated     using the CKD EPI equation.     This calculation has not been     validated in all clinical     situations.     eGFR's persistently     <90 mL/min signify     possible Chronic Kidney Disease.  LIPASE, BLOOD     Status: None   Collection Time    01/24/13  3:16 AM      Result Value Range   Lipase 55  11 - 59 U/L  URINALYSIS, ROUTINE W REFLEX MICROSCOPIC      Status: Abnormal   Collection Time    01/24/13  3:27 AM      Result Value Range   Color, Urine YELLOW  YELLOW   APPearance TURBID (*) CLEAR   Specific Gravity, Urine 1.021  1.005 - 1.030   pH 7.5  5.0 - 8.0   Glucose, UA NEGATIVE  NEGATIVE mg/dL   Hgb urine dipstick NEGATIVE  NEGATIVE   Bilirubin Urine NEGATIVE  NEGATIVE   Ketones, ur 15 (*) NEGATIVE mg/dL   Protein, ur NEGATIVE  NEGATIVE mg/dL   Urobilinogen, UA 0.2  0.0 - 1.0 mg/dL   Nitrite NEGATIVE  NEGATIVE   Leukocytes, UA NEGATIVE  NEGATIVE  URINE MICROSCOPIC-ADD ON     Status: Abnormal   Collection Time    01/24/13  3:27 AM      Result Value Range   Squamous Epithelial / LPF FEW (*) RARE   Urine-Other AMORPHOUS URATES/PHOSPHATES     US Abdomen Complete  01/24/2013   *RADIOLOGY REPORT*  Clinical Data:  Right upper quadrant pain.  Nausea and vomiting.  COMPLETE ABDOMINAL ULTRASOUND  Comparison:  None.  Findings:  Gallbladder:  Multiple stones in the dependent portion of gallbladder.  Sludge. Mild diffuse gallbladder wall thickening. Murphy's sign is positive.  Findings are nonspecific but associated with acute cholecystitis in the appropriate clinical setting.  Common bile duct:  Normal caliber with measured diameter at 2.9 mm.  Liver:  There is a heterogeneous mixed echogenic mass in the right lobe of the liver posteriorly.  The mass is measured at about 5 cm diameter.  Color flow Doppler images demonstrate no flow.  Suggest non emergent follow-up MRI for further characterization of this lesion.  No other focal liver lesions demonstrated.  IVC:  Appears normal.  Pancreas:  No focal abnormality seen.  Spleen:  In length measures 10.1 cm.  Normal parenchymal echotexture.  Right Kidney:  Right kidney measures 11.6 cm length.  No hydronephrosis.  Left Kidney:  Left kidney measures 10.9 cm length.  No hydronephrosis.  Abdominal aorta:  No aneurysm identified.  IMPRESSION: Cholelithiasis with thick walled gallbladder, gallbladder sludge,  and positive Murphy's sign.  These features are associated with acute cholecystitis.  Solid appearing hypoechoic mass in the right lobe of the liver. Recommend MRI for further characterization.   Original Report Authenticated By: Burman Nieves, M.D.    Review of Systems  Constitutional: Negative for fever and chills.  HENT: Negative.   Respiratory: Negative for shortness of breath and wheezing.   Cardiovascular: Negative for chest pain and palpitations.  Gastrointestinal: Positive for nausea, vomiting and abdominal pain.  Genitourinary: Negative.   Musculoskeletal: Negative.   Skin: Negative.   Neurological: Negative.  Negative for weakness.  Endo/Heme/Allergies: Negative.   Psychiatric/Behavioral: Negative.     Blood pressure 160/109, pulse 79, temperature 98.1 F (36.7 C), temperature source Oral, resp. rate 23, SpO2 100.00%. Physical Exam  Constitutional: She is oriented to person, place, and time. She appears well-developed and well-nourished. No distress.  HENT:  Head: Normocephalic and atraumatic.  Right Ear: External ear normal.  Left Ear: External ear normal.  Nose: Nose normal.  Mouth/Throat: Oropharynx is clear and moist. No oropharyngeal exudate.  Eyes: Conjunctivae are normal. Pupils are equal, round, and reactive to light. Right eye exhibits no discharge. Left eye exhibits no discharge. No scleral icterus.  Neck: Normal range of motion. Neck supple. No tracheal deviation present.  Cardiovascular: Normal rate, regular rhythm, normal heart sounds and intact distal pulses.   No murmur heard. Respiratory: Effort normal and breath sounds normal. No respiratory distress. She has no wheezes.  GI: Soft. Bowel sounds are normal. She exhibits no distension. There is tenderness. There is guarding. There is no rebound.  There is tenderness with guarding in the right upper quadrant  Musculoskeletal: Normal range of motion. She exhibits no edema and no tenderness.   Lymphadenopathy:    She has no cervical adenopathy.  Neurological: She is alert and oriented to person, place, and time.  Skin: Skin is warm and dry.  Psychiatric: Her behavior is normal. Judgment normal.     Assessment/Plan Acute cholecystitis with cholelithiasis  Patient will be admitted to the hospital and started on IV antibiotics. She'll be kept at bowel rest. Laparoscopic cholecystectomy with possible cholangiogram will be planned this admission.  Micahel Omlor A 01/24/2013, 6:34 AM

## 2013-01-24 NOTE — Anesthesia Procedure Notes (Signed)
Procedure Name: Intubation Date/Time: 01/24/2013 4:00 PM Performed by: Alanda Amass A Pre-anesthesia Checklist: Patient identified, Timeout performed, Emergency Drugs available, Suction available and Patient being monitored Patient Re-evaluated:Patient Re-evaluated prior to inductionOxygen Delivery Method: Circle system utilized Preoxygenation: Pre-oxygenation with 100% oxygen Intubation Type: IV induction Ventilation: Mask ventilation without difficulty Laryngoscope Size: Mac and 3 Grade View: Grade IV Tube type: Oral Tube size: 7.0 mm Number of attempts: 2 Airway Equipment and Method: Stylet Placement Confirmation: ETT inserted through vocal cords under direct vision,  breath sounds checked- equal and bilateral and positive ETCO2 Secured at: 21 cm Tube secured with: Tape Dental Injury: Teeth and Oropharynx as per pre-operative assessment

## 2013-01-25 MED ORDER — OXYCODONE-ACETAMINOPHEN 5-325 MG PO TABS: 1.0000 | ORAL_TABLET | ORAL | Status: DC | PRN

## 2013-01-25 NOTE — Discharge Summary (Signed)
Physician Discharge Summary  Patient ID: Candace [name] MRN: [id number] DOB/AGE: 1961-01-24 52 y.o.  Admit date: 01/24/2013 Discharge date: 01/25/2013  Admitting Diagnosis: Acute Cholecystitis with Cholelithiasis   Discharge Diagnosis Patient Active Problem List   Diagnosis Date Noted  . Depression   . Endometrial hyperplasia   . Oligomenorrhea   . Insomnia   . H/O hemorrhoids   . Epidermal cyst   . High risk HPV infection   . Carpal tunnel syndrome on left 05/28/2011    Consultants none  Imaging: Dg Cholangiogram Operative  01/24/2013   *RADIOLOGY REPORT*  Clinical Data: Cholelithiasis and cholecystitis.  INTRAOPERATIVE CHOLANGIOGRAM  Technique:  Multiple fluoroscopic spot radiographs were obtained during intraoperative cholangiogram and are submitted for interpretation post-operatively.  Comparison: Ultrasound dated 01/24/2013  Findings: Common hepatic and common bile duct and visualized intrahepatic ducts are normal.  Free flow of contrast into the duodenum.  IMPRESSION: Normal intraoperative cholangiogram.   Original Report Authenticated By: Francene Boyers, M.D.   US Abdomen Complete  01/24/2013   *RADIOLOGY REPORT*  Clinical Data:  Right upper quadrant pain.  Nausea and vomiting.  COMPLETE ABDOMINAL ULTRASOUND  Comparison:  None.  Findings:  Gallbladder:  Multiple stones in the dependent portion of gallbladder.  Sludge. Mild diffuse gallbladder wall thickening. Murphy's sign is positive.  Findings are nonspecific but associated with acute cholecystitis in the appropriate clinical setting.  Common bile duct:  Normal caliber with measured diameter at 2.9 mm.  Liver:  There is a heterogeneous mixed echogenic mass in the right lobe of the liver posteriorly.  The mass is measured at about 5 cm diameter.  Color flow Doppler images demonstrate no flow.  Suggest non emergent follow-up MRI for further characterization of this lesion.  No other focal liver lesions demonstrated.  IVC:  Appears  normal.  Pancreas:  No focal abnormality seen.  Spleen:  In length measures 10.1 cm.  Normal parenchymal echotexture.  Right Kidney:  Right kidney measures 11.6 cm length.  No hydronephrosis.  Left Kidney:  Left kidney measures 10.9 cm length.  No hydronephrosis.  Abdominal aorta:  No aneurysm identified.  IMPRESSION: Cholelithiasis with thick walled gallbladder, gallbladder sludge, and positive Murphy's sign.  These features are associated with acute cholecystitis.  Solid appearing hypoechoic mass in the right lobe of the liver. Recommend MRI for further characterization.   Original Report Authenticated By: Burman Nieves, M.D.    Procedures Laparoscopic Cholecystectomy with IOC(Dr. Corliss Skains 01/24/13)  Hospital Course:  Candace [name] is a healthy female who [facility] with RUQ abdominal pain.  Workup showed acute cholecystitis.  Patient was admitted and underwent procedure listed above.  Had a negative IOC. Tolerated procedure well and was transferred to the floor.  Diet was advanced as tolerated.  On POD  #1 the patient was voiding well, tolerating diet, ambulating well, pain well controlled, vital signs stable, incisions c/d/i and felt stable for discharge home.  She did not have any pain medication and declined to be given a dose to ensure she can tolerate.  She states she will use tylenol and ibuprofen and percocet prn for severe pain, which I agreed with.  Patient will follow up in our office in 2 weeks and knows to call with questions or concerns.  Physical Exam: General:  Alert, NAD, pleasant, comfortable Abd:  Soft, ND, mild tenderness, incisions C/D/I, drain with minimal sanguinous drainage    Medication List         cetirizine 10 MG tablet  Commonly known  as:  ZYRTEC  Take 10 mg by mouth daily as needed.     FISH OIL PO  Take 2 capsules by mouth daily.     FLUoxetine 10 MG capsule  Commonly known as:  PROZAC  Take 10 mg by mouth daily. Pt takes at lunch     multivitamins ther.  w/minerals Tabs  Take 1 tablet by mouth daily.     oxyCODONE-acetaminophen 5-325 MG per tablet  Commonly known as:  PERCOCET/ROXICET  Take 1 tablet by mouth every 4 (four) hours as needed.            Follow-up Information   Follow up with [facility] On 02/09/2013. (APPOINTMENT TIME:10AM.  PLEASE ARRIVE AT 9:30AM)    Contact information:   [address]   [place] [address] [phone]       Signed: Ashok Norris, [facility] [phone]  01/25/2013, 9:48 AM

## 2013-01-25 NOTE — Discharge Summary (Signed)
Billal Rollo, MD, MPH, FACS Pager: [phone]  

## 2013-01-26 ENCOUNTER — Encounter (HOSPITAL_COMMUNITY): Payer: Self-pay | Admitting: Surgery

## 2013-02-09 ENCOUNTER — Ambulatory Visit (INDEPENDENT_AMBULATORY_CARE_PROVIDER_SITE_OTHER): Payer: Commercial Managed Care - PPO | Admitting: General Surgery

## 2013-02-09 ENCOUNTER — Encounter (INDEPENDENT_AMBULATORY_CARE_PROVIDER_SITE_OTHER): Payer: Self-pay | Admitting: General Surgery

## 2013-02-09 VITALS — BP 124/76 | HR 72 | Temp 97.8°F | Resp 14 | Ht 67.0 in | Wt 203.4 lb

## 2013-02-09 DIAGNOSIS — K8020 Calculus of gallbladder without cholecystitis without obstruction: Secondary | ICD-10-CM

## 2013-02-09 NOTE — Progress Notes (Signed)
Subjective:     Patient ID: Candace [name], female   DOB: April 27, 1961, 52 y.o.   MRN: [id number]  HPI Pt here for a follow ups/p lap chole 01/24/13.  No pain, did not take rx for pain medication.  Back to regular activities.  Review of Systems  Constitutional: Negative for fever and chills.  Gastrointestinal: Negative for nausea, vomiting, abdominal pain, constipation and abdominal distention.       Objective:   Physical Exam  Abdominal: Soft. Bowel sounds are normal. She exhibits no distension and no mass. There is no tenderness. There is no rebound and no guarding.  Healing incisions       Assessment:     Chronic cholelithiasis S/p lap cholecystectomy     Plan:     Doing great.  Resume normal activities.  Follow up as needed.

## 2013-02-09 NOTE — Patient Instructions (Signed)
You may resume normal activities.  Follow up with [name] as needed.

## 2013-11-12 ENCOUNTER — Other Ambulatory Visit: Payer: Self-pay | Admitting: Obstetrics and Gynecology

## 2013-11-12 DIAGNOSIS — Z1231 Encounter for screening mammogram for malignant neoplasm of breast: Secondary | ICD-10-CM

## 2013-11-25 ENCOUNTER — Ambulatory Visit (HOSPITAL_COMMUNITY): Payer: 59

## 2013-11-30 ENCOUNTER — Ambulatory Visit (HOSPITAL_COMMUNITY)
Admission: RE | Admit: 2013-11-30 | Discharge: 2013-11-30 | Disposition: A | Discharge status: Home / Self Care | Payer: 59 | Source: Ambulatory Visit | Attending: Obstetrics and Gynecology | Admitting: Obstetrics and Gynecology

## 2013-11-30 DIAGNOSIS — Z1231 Encounter for screening mammogram for malignant neoplasm of breast: Secondary | ICD-10-CM | POA: Insufficient documentation

## 2014-05-02 ENCOUNTER — Encounter (INDEPENDENT_AMBULATORY_CARE_PROVIDER_SITE_OTHER): Payer: Self-pay | Admitting: General Surgery

## 2014-05-09 ENCOUNTER — Other Ambulatory Visit: Payer: Self-pay | Admitting: Dermatology

## 2015-02-02 ENCOUNTER — Other Ambulatory Visit (HOSPITAL_COMMUNITY): Payer: Self-pay | Admitting: Obstetrics and Gynecology

## 2015-02-02 DIAGNOSIS — Z1231 Encounter for screening mammogram for malignant neoplasm of breast: Secondary | ICD-10-CM

## 2015-02-28 ENCOUNTER — Ambulatory Visit (HOSPITAL_COMMUNITY)
Admission: RE | Admit: 2015-02-28 | Discharge: 2015-02-28 | Disposition: A | Discharge status: Home / Self Care | Payer: 59 | Source: Ambulatory Visit | Attending: Obstetrics and Gynecology | Admitting: Obstetrics and Gynecology

## 2015-02-28 DIAGNOSIS — Z1231 Encounter for screening mammogram for malignant neoplasm of breast: Secondary | ICD-10-CM | POA: Insufficient documentation

## 2015-07-02 DIAGNOSIS — C801 Malignant (primary) neoplasm, unspecified: Secondary | ICD-10-CM

## 2015-07-02 HISTORY — DX: Malignant (primary) neoplasm, unspecified: C80.1

## 2015-08-09 MED FILL — FLUoxetine HCL 10 MG CAPS: 10 | 90 days supply | Qty: 90 | Fill #2

## 2015-09-01 DIAGNOSIS — Z01419 Encounter for gynecological examination (general) (routine) without abnormal findings: Secondary | ICD-10-CM | POA: Diagnosis not present

## 2015-10-27 MED FILL — BUPROPION HCL XL 150 MG TAB: 150 | 90 days supply | Qty: 90 | Fill #2

## 2015-12-07 MED FILL — ESOMEPRAZOLE MAG DR 40 MG C: 40 | 30 days supply | Qty: 30 | Fill #0

## 2015-12-07 MED FILL — FLUoxetine HCL 10 MG CAPS: 10 | 90 days supply | Qty: 90 | Fill #0

## 2016-02-20 DIAGNOSIS — D485 Neoplasm of uncertain behavior of skin: Secondary | ICD-10-CM | POA: Diagnosis not present

## 2016-02-20 DIAGNOSIS — D225 Melanocytic nevi of trunk: Secondary | ICD-10-CM | POA: Diagnosis not present

## 2016-02-20 DIAGNOSIS — D2261 Melanocytic nevi of right upper limb, including shoulder: Secondary | ICD-10-CM | POA: Diagnosis not present

## 2016-02-20 DIAGNOSIS — C44519 Basal cell carcinoma of skin of other part of trunk: Secondary | ICD-10-CM | POA: Diagnosis not present

## 2016-02-20 DIAGNOSIS — L814 Other melanin hyperpigmentation: Secondary | ICD-10-CM | POA: Diagnosis not present

## 2016-02-20 DIAGNOSIS — L820 Inflamed seborrheic keratosis: Secondary | ICD-10-CM | POA: Diagnosis not present

## 2016-02-20 DIAGNOSIS — D235 Other benign neoplasm of skin of trunk: Secondary | ICD-10-CM | POA: Diagnosis not present

## 2016-02-20 DIAGNOSIS — D2271 Melanocytic nevi of right lower limb, including hip: Secondary | ICD-10-CM | POA: Diagnosis not present

## 2016-02-20 DIAGNOSIS — D2272 Melanocytic nevi of left lower limb, including hip: Secondary | ICD-10-CM | POA: Diagnosis not present

## 2016-02-20 DIAGNOSIS — D2262 Melanocytic nevi of left upper limb, including shoulder: Secondary | ICD-10-CM | POA: Diagnosis not present

## 2016-02-22 DIAGNOSIS — F419 Anxiety disorder, unspecified: Secondary | ICD-10-CM | POA: Diagnosis not present

## 2016-02-22 DIAGNOSIS — E559 Vitamin D deficiency, unspecified: Secondary | ICD-10-CM | POA: Diagnosis not present

## 2016-02-22 DIAGNOSIS — K219 Gastro-esophageal reflux disease without esophagitis: Secondary | ICD-10-CM | POA: Diagnosis not present

## 2016-02-22 DIAGNOSIS — G2581 Restless legs syndrome: Secondary | ICD-10-CM | POA: Diagnosis not present

## 2016-02-22 DIAGNOSIS — Z79899 Other long term (current) drug therapy: Secondary | ICD-10-CM | POA: Diagnosis not present

## 2016-02-22 DIAGNOSIS — E785 Hyperlipidemia, unspecified: Secondary | ICD-10-CM | POA: Diagnosis not present

## 2016-02-22 MED FILL — FLUoxetine HCL 10 MG CAPS: 10 | 90 days supply | Qty: 90 | Fill #0

## 2016-02-22 MED FILL — BUPROPION HCL XL 150 MG TAB: 150 | 90 days supply | Qty: 90 | Fill #0

## 2016-02-22 MED FILL — rOPINIRole HCL 0.25 MG TABS: 0.25 | 90 days supply | Qty: 90 | Fill #0

## 2016-02-22 MED FILL — ESOMEPRAZOLE MAG DR 40 MG C: 40 | 90 days supply | Qty: 90 | Fill #0

## 2016-02-23 MED FILL — ROSUVASTATIN CALCIUM 10 MG: 10 | 90 days supply | Qty: 90 | Fill #0

## 2016-02-29 ENCOUNTER — Other Ambulatory Visit: Payer: Self-pay | Admitting: Obstetrics and Gynecology

## 2016-02-29 DIAGNOSIS — Z1231 Encounter for screening mammogram for malignant neoplasm of breast: Secondary | ICD-10-CM

## 2016-03-20 ENCOUNTER — Ambulatory Visit: Payer: 59

## 2016-03-27 ENCOUNTER — Ambulatory Visit
Admission: RE | Admit: 2016-03-27 | Discharge: 2016-03-27 | Disposition: A | Discharge status: Home / Self Care | Payer: 59 | Source: Ambulatory Visit | Attending: Obstetrics and Gynecology | Admitting: Obstetrics and Gynecology

## 2016-03-27 DIAGNOSIS — Z1231 Encounter for screening mammogram for malignant neoplasm of breast: Secondary | ICD-10-CM

## 2016-04-01 ENCOUNTER — Other Ambulatory Visit: Payer: Self-pay | Admitting: Obstetrics and Gynecology

## 2016-04-01 DIAGNOSIS — R928 Other abnormal and inconclusive findings on diagnostic imaging of breast: Secondary | ICD-10-CM

## 2016-04-04 ENCOUNTER — Other Ambulatory Visit: Payer: Self-pay | Admitting: Obstetrics and Gynecology

## 2016-04-04 ENCOUNTER — Ambulatory Visit
Admission: RE | Admit: 2016-04-04 | Discharge: 2016-04-04 | Disposition: A | Discharge status: Home / Self Care | Payer: 59 | Source: Ambulatory Visit | Attending: Obstetrics and Gynecology | Admitting: Obstetrics and Gynecology

## 2016-04-04 DIAGNOSIS — R928 Other abnormal and inconclusive findings on diagnostic imaging of breast: Secondary | ICD-10-CM

## 2016-04-04 DIAGNOSIS — N6321 Unspecified lump in the left breast, upper outer quadrant: Secondary | ICD-10-CM | POA: Diagnosis not present

## 2016-04-04 DIAGNOSIS — N6310 Unspecified lump in the right breast, unspecified quadrant: Secondary | ICD-10-CM | POA: Diagnosis not present

## 2016-04-05 ENCOUNTER — Ambulatory Visit
Admission: RE | Admit: 2016-04-05 | Discharge: 2016-04-05 | Disposition: A | Discharge status: Home / Self Care | Payer: 59 | Source: Ambulatory Visit | Attending: Obstetrics and Gynecology | Admitting: Obstetrics and Gynecology

## 2016-04-05 DIAGNOSIS — C50919 Malignant neoplasm of unspecified site of unspecified female breast: Secondary | ICD-10-CM

## 2016-04-05 DIAGNOSIS — R928 Other abnormal and inconclusive findings on diagnostic imaging of breast: Secondary | ICD-10-CM

## 2016-04-05 DIAGNOSIS — C50411 Malignant neoplasm of upper-outer quadrant of right female breast: Secondary | ICD-10-CM | POA: Diagnosis not present

## 2016-04-05 HISTORY — DX: Malignant neoplasm of unspecified site of unspecified female breast: C50.919

## 2016-04-11 DIAGNOSIS — H5213 Myopia, bilateral: Secondary | ICD-10-CM | POA: Diagnosis not present

## 2016-04-17 ENCOUNTER — Other Ambulatory Visit: Payer: Self-pay | Admitting: General Surgery

## 2016-04-17 ENCOUNTER — Encounter: Payer: Self-pay | Admitting: Genetic Counselor

## 2016-04-17 DIAGNOSIS — C50411 Malignant neoplasm of upper-outer quadrant of right female breast: Secondary | ICD-10-CM | POA: Diagnosis not present

## 2016-04-17 DIAGNOSIS — C50011 Malignant neoplasm of nipple and areola, right female breast: Secondary | ICD-10-CM

## 2016-04-18 ENCOUNTER — Ambulatory Visit
Admission: RE | Admit: 2016-04-18 | Discharge: 2016-04-18 | Disposition: A | Discharge status: Home / Self Care | Payer: 59 | Source: Ambulatory Visit | Attending: General Surgery | Admitting: General Surgery

## 2016-04-18 DIAGNOSIS — C50011 Malignant neoplasm of nipple and areola, right female breast: Secondary | ICD-10-CM

## 2016-04-18 MED FILL — LORazepam 1 MG TABS: 1 | 1 days supply | Qty: 1 | Fill #0

## 2016-04-22 ENCOUNTER — Encounter: Payer: Self-pay | Admitting: Oncology

## 2016-04-22 ENCOUNTER — Telehealth: Payer: Self-pay | Admitting: Oncology

## 2016-04-22 NOTE — Telephone Encounter (Signed)
Pt returned my call. She confirmed appt for genetics on 10/24 and also made aware appt has been scheduled on 11/2 w/Magrinat at 4pm. Demographics verified. Aware to arrive 30 minutes early.

## 2016-04-22 NOTE — Telephone Encounter (Signed)
Appt scheduled w/ Magrinat for 11/2 @4pm . Left vm w/appt date and time. Letter mailed to the pt. Msg sent to [facility] for urgent genetics appt.

## 2016-04-23 ENCOUNTER — Ambulatory Visit (HOSPITAL_BASED_OUTPATIENT_CLINIC_OR_DEPARTMENT_OTHER): Payer: 59 | Admitting: Genetic Counselor

## 2016-04-23 ENCOUNTER — Encounter: Payer: Self-pay | Admitting: Genetic Counselor

## 2016-04-23 ENCOUNTER — Other Ambulatory Visit: Payer: 59

## 2016-04-23 ENCOUNTER — Ambulatory Visit
Admission: RE | Admit: 2016-04-23 | Discharge: 2016-04-23 | Disposition: A | Discharge status: Home / Self Care | Payer: 59 | Source: Ambulatory Visit | Attending: General Surgery | Admitting: General Surgery

## 2016-04-23 DIAGNOSIS — Z806 Family history of leukemia: Secondary | ICD-10-CM

## 2016-04-23 DIAGNOSIS — C50911 Malignant neoplasm of unspecified site of right female breast: Secondary | ICD-10-CM

## 2016-04-23 DIAGNOSIS — Z315 Encounter for genetic counseling: Secondary | ICD-10-CM

## 2016-04-23 DIAGNOSIS — Z803 Family history of malignant neoplasm of breast: Secondary | ICD-10-CM

## 2016-04-23 DIAGNOSIS — Z170 Estrogen receptor positive status [ER+]: Principal | ICD-10-CM

## 2016-04-23 DIAGNOSIS — D0581 Other specified type of carcinoma in situ of right breast: Secondary | ICD-10-CM | POA: Diagnosis not present

## 2016-04-23 MED ORDER — GADOBENATE DIMEGLUMINE 529 MG/ML IV SOLN
20.0000 mL | Freq: Once | INTRAVENOUS | Status: AC | PRN
  Administered 2016-04-23: 20 mL via INTRAVENOUS

## 2016-04-23 NOTE — Progress Notes (Signed)
REFERRING PROVIDER: [name], MD  OTHER PROVIDER(S): Lurline Del, MD  PRIMARY PROVIDER:  Vidal Schwalbe, MD  PRIMARY REASON FOR VISIT:  1. Malignant neoplasm of right breast in female, estrogen receptor positive, unspecified site of breast ([place])   2. Family history of breast cancer in sister   67. Family history of leukemia      HISTORY OF PRESENT ILLNESS:   Candace [name], a 55 y.o. female, was seen for a [place] cancer genetics consultation at the request of Dr. Donne Hazel due to a personal history of breast cancer and family history of breast cancer.  Candace [name] presents to clinic today to discuss the possibility of a hereditary predisposition to cancer, genetic testing, and to further clarify her future cancer risks, as well as potential cancer risks for family members.   In October 2017, at the age of 64, Candace [name] was diagnosed with invasive lobular carcinoma with LCIS of the right breast. Hormone receptor status is ER/PR+, Her2-.  She will get a breast MRI today, and results from breast MRI and genetic testing will help inform surgical and treatment decisions.  Candace [name] reports additional history of a "basal cell carcinoma" removed from the base of her spine in August or September of this year.  However, she reports that her doctor said this was not a cancer.  She plans on following up with dermatology regularly from now on.    HORMONAL RISK FACTORS:  Menarche was at age 23.  First live birth at age 35.  OCP use for approximately 12 years.  Ovaries intact: yes.  Hysterectomy: no.  Menopausal status: postmenopausal.  HRT use: 0 years. Colonoscopy: yes; first colonoscopy at age 82, found 2-3 polyps; on a 5-year schedule. Mammogram within the last year: yes. Number of breast biopsies: 1. Up to date with pelvic exams:  yes. Any excessive radiation exposure/other exposures in the past:  no  Past Medical History:  Diagnosis Date  . Abnormal Pap smear   . Anovulation   .  Depression   . Endometrial hyperplasia   . Endometrial polyp    h/o  . Epidermal cyst    h/o  . H/O hemorrhoids   . H/O menorrhagia   . High risk HPV infection   . Insomnia   . Oligomenorrhea   . Seasonal allergies     Past Surgical History:  Procedure Laterality Date  . CARPAL TUNNEL RELEASE  03/26/2011   right hand  . CARPAL TUNNEL RELEASE  05/28/2011   Procedure: CARPAL TUNNEL RELEASE;  Surgeon: [name];  Location: [facility];  Service: Orthopedics;  Laterality: Left;  . CHOLECYSTECTOMY N/A 01/24/2013   Procedure: LAPAROSCOPIC CHOLECYSTECTOMY WITH INTRAOPERATIVE CHOLANGIOGRAM;  Surgeon: [name], MD;  Location: [facility];  Service: General;  Laterality: N/A;  . HYSTEROSCOPY    . POLYPECTOMY    . TONSILLECTOMY  1967   as child  . trigger fingers  8 yrs. ago   both done months apart  . uterine ablation  06/04/2010    Social History   Social History  . Marital status: Single    Spouse name: N/A  . Number of children: N/A  . Years of education: N/A   Social History Main Topics  . Smoking status: Never Smoker  . Smokeless tobacco: Never Used  . Alcohol use No     Comment: none since 1996  . Drug use: No  . Sexual activity: Not Currently    Birth control/ protection: None  Other Topics Concern  . None   Social History Narrative  . None     FAMILY HISTORY:  We obtained a detailed, 4-generation family history.  Significant diagnoses are listed below: Family History  Problem Relation Age of Onset  . Heart disease Mother   . Hypertension Mother   . Stroke Mother   . Dementia Mother     vascular dementia  . Pulmonary fibrosis Father     d. 60  . Depression Father   . Heart attack Maternal Grandmother     d. [phone]  . Heart disease Maternal Grandmother   . Heart disease Maternal Grandfather     d. 60-63  . Leukemia Paternal Grandmother     d. late 52s  . Breast cancer Sister 10    IDC s/p mastectomy and chemo    Candace [name] has two  daughter, ages 22 and 20.  Candace [name] has one full brother and sister, ages 67 and 53.  Her sister was diagnosed with invasive ductal carcinoma breast cancer at 53. She underwent a mastectomy and chemotherapy; she had genetic counseling at the time, but did not have genetic testing due to her not meeting her insurance criteria for testing coverage.  Candace [name] brother is cancer-free.    Candace [name] mother died of "vascular dementia" at 91; she also had a history of stroke.  Candace [name] mother was an only child.  Candace [name] maternal grandmother died of hear disease and myocardial infarction at 42-65.  Her grandfather died of heart disease at age 48-63.  She has no information for her maternal great aunts/uncles and great grandparents.  Candace [name] father died of idiopathic pulmonary fibrosis at 67.  He had two full brothers, both of whom died in childhood.  One brother died from polio and the other brother had a history of brain damage attributed to a forceps birth.  Candace [name] paternal grandmother was diagnosed with leukemia and passed away in her late 37s.  Her grandfather died at 46 and never had cancer.  She has no information for her paternal great aunts/uncles and great grandparents.    Patient's maternal ancestors are of [place] descent, and paternal ancestors are of [place] descent. There is no reported Ashkenazi Jewish ancestry. There is no known consanguinity.  GENETIC COUNSELING ASSESSMENT: Candace [name] is a 55 y.o. female with a personal and family history of breast cancer which is somewhat suggestive of a hereditary breast cancer syndrome and predisposition to cancer. We, therefore, discussed and recommended the following at today's visit.   DISCUSSION: We reviewed the characteristics, features and inheritance patterns of hereditary cancer syndromes, particularly those caused by mutations within the BRCA1/2 genes. We also discussed genetic testing, including the appropriate family members  to test, the process of testing, insurance coverage and turn-around-time for results. We discussed the implications of a negative, positive and/or variant of uncertain significant result. We recommended Candace [name] pursue genetic testing for the 20-gene Breast/Ovarian Cancer Panel through [facility].  The Breast/Ovarian Cancer Panel offered by [facility] Hope Pigeon, MD) includes sequencing and deletion/duplication analysis for the following 19 genes:  ATM, BARD1, BRCA1, BRCA2, BRIP1, CDH1, CHEK2, FANCC, MLH1, MSH2, MSH6, NBN, PALB2, PMS2, PTEN, RAD51C, RAD51D, TP53, and XRCC2.  This panel also includes deletion/duplication analysis (without sequencing) for one gene, EPCAM.   Based on Candace [name] personal and family history of cancer, she meets medical criteria for genetic testing. Despite that she meets criteria, she may still have an out  of pocket cost. We discussed that if her out of pocket cost for testing is over $100, the laboratory will call and confirm whether she wants to proceed with testing.  If the out of pocket cost of testing is less than $100 she will be billed by the genetic testing laboratory.   PLAN: After considering the risks, benefits, and limitations, Candace [name]  provided informed consent to pursue genetic testing and the blood sample was sent to [facility] for analysis of the 20-gene Breast/Ovarian Cancer Panel. Results should be available within approximately 2 weeks' time, at which point they will be disclosed by telephone to Candace [name], as will any additional recommendations warranted by these results. Candace [name] will receive a summary of her genetic counseling visit and a copy of her results once available. This information will also be available in Epic. We encouraged Candace [name] to remain in contact with cancer genetics annually so that we can continuously update the family history and inform her of any changes in cancer genetics and testing that may be of benefit for her  family. Ms. Maddalena questions were answered to her satisfaction today. Our contact information was provided should additional questions or concerns arise.  Thank you for the referral and allowing [name] to share in the care of your patient.   Jeanine Luz, MS, [facility] Certified Genetic Counselor [email or web address] Phone: [phone]  The patient was seen for a total of 60 minutes in face-to-face genetic counseling.  This patient was discussed with Drs. Magrinat, Lindi Adie and/or Burr Medico who agrees with the above.    _______________________________________________________________________ For Office Staff:  Number of people involved in session: 1 Was an Intern/ student involved with case: no

## 2016-04-25 ENCOUNTER — Other Ambulatory Visit: Payer: Self-pay | Admitting: General Surgery

## 2016-04-25 DIAGNOSIS — Z170 Estrogen receptor positive status [ER+]: Principal | ICD-10-CM

## 2016-04-25 DIAGNOSIS — Z803 Family history of malignant neoplasm of breast: Secondary | ICD-10-CM | POA: Diagnosis not present

## 2016-04-25 DIAGNOSIS — C50411 Malignant neoplasm of upper-outer quadrant of right female breast: Secondary | ICD-10-CM

## 2016-04-29 ENCOUNTER — Other Ambulatory Visit: Payer: 59

## 2016-05-01 ENCOUNTER — Other Ambulatory Visit: Payer: Self-pay | Admitting: *Deleted

## 2016-05-01 DIAGNOSIS — C50911 Malignant neoplasm of unspecified site of right female breast: Secondary | ICD-10-CM

## 2016-05-01 DIAGNOSIS — Z170 Estrogen receptor positive status [ER+]: Principal | ICD-10-CM

## 2016-05-01 HISTORY — PX: BREAST LUMPECTOMY: SHX2

## 2016-05-02 ENCOUNTER — Ambulatory Visit (HOSPITAL_BASED_OUTPATIENT_CLINIC_OR_DEPARTMENT_OTHER): Payer: 59 | Admitting: Oncology

## 2016-05-02 ENCOUNTER — Other Ambulatory Visit (HOSPITAL_BASED_OUTPATIENT_CLINIC_OR_DEPARTMENT_OTHER): Payer: 59

## 2016-05-02 DIAGNOSIS — C50411 Malignant neoplasm of upper-outer quadrant of right female breast: Secondary | ICD-10-CM | POA: Insufficient documentation

## 2016-05-02 DIAGNOSIS — C50911 Malignant neoplasm of unspecified site of right female breast: Secondary | ICD-10-CM

## 2016-05-02 DIAGNOSIS — Z170 Estrogen receptor positive status [ER+]: Secondary | ICD-10-CM | POA: Diagnosis not present

## 2016-05-02 LAB — CBC WITH DIFFERENTIAL/PLATELET
BASO%: 0.9 % (ref 0.0–2.0)
Basophils Absolute: 0.1 10*3/uL (ref 0.0–0.1)
EOS ABS: 0.2 10*3/uL (ref 0.0–0.5)
EOS%: 2.6 % (ref 0.0–7.0)
HCT: 42.3 % (ref 34.8–46.6)
HEMOGLOBIN: 14 g/dL (ref 11.6–15.9)
LYMPH%: 35.3 % (ref 14.0–49.7)
MCH: 28.3 pg (ref 25.1–34.0)
MCHC: 33.1 g/dL (ref 31.5–36.0)
MCV: 85.5 fL (ref 79.5–101.0)
MONO#: 0.8 10*3/uL (ref 0.1–0.9)
MONO%: 10.1 % (ref 0.0–14.0)
NEUT%: 51.1 % (ref 38.4–76.8)
NEUTROS ABS: 4.1 10*3/uL (ref 1.5–6.5)
Platelets: 269 10*3/uL (ref 145–400)
RBC: 4.94 10*6/uL (ref 3.70–5.45)
RDW: 12.6 % (ref 11.2–14.5)
WBC: 7.9 10*3/uL (ref 3.9–10.3)
lymph#: 2.8 10*3/uL (ref 0.9–3.3)

## 2016-05-02 LAB — COMPREHENSIVE METABOLIC PANEL
ALBUMIN: 3.7 g/dL (ref 3.5–5.0)
ALK PHOS: 83 U/L (ref 40–150)
ALT: 32 U/L (ref 0–55)
AST: 20 U/L (ref 5–34)
Anion Gap: 7 mEq/L (ref 3–11)
BILIRUBIN TOTAL: 0.27 mg/dL (ref 0.20–1.20)
BUN: 12.4 mg/dL (ref 7.0–26.0)
CO2: 28 meq/L (ref 22–29)
CREATININE: 0.8 mg/dL (ref 0.6–1.1)
Calcium: 9.7 mg/dL (ref 8.4–10.4)
Chloride: 106 mEq/L (ref 98–109)
EGFR: 79 mL/min/{1.73_m2} — AB (ref >90)
GLUCOSE: 109 mg/dL (ref 70–140)
Potassium: 3.9 mEq/L (ref 3.5–5.1)
SODIUM: 140 meq/L (ref 136–145)
TOTAL PROTEIN: 7 g/dL (ref 6.4–8.3)

## 2016-05-02 NOTE — Progress Notes (Signed)
[place]  [phone] [phone]     ID: SACOYA MCGOURTY DOB: 1960-10-01  MR#: [id number]  WKG#:[id number]  Patient Care Team: [name], MD as PCP - General (Family Medicine) Chauncey Cruel, MD as Consulting Physician (Oncology) [name], MD as Consulting Physician (General Surgery) Eldred Manges, MD as Consulting Physician (Obstetrics and Gynecology) Ronald Lobo, MD as Consulting Physician (Gastroenterology) Danella Sensing, MD as Consulting Physician (Dermatology) Chauncey Cruel, MD OTHER MD:  CHIEF COMPLAINT: Estrogen receptor positive breast cancer   CURRENT TREATMENT: Awaiting definitive surgery   BREAST CANCER HISTORY: Candace [name] had routine screening mammography with tomography at the [facility] 03/29/2016. On 04/04/2016 she underwent right diagnostic mammography and tomography with right breast ultrasonography. The breast density was category B. In the upper outer quadrant of the breast there was a mass measuring 0.7 cm, with irregular margins. By ultrasonography this was located at the 10:00 position 9 cm from the nipple measuring 0.6 cm. Right axillary ultrasound was negative.  Biopsy of the right breast mass in question 04/05/2016 showed ([id number]) and invasive lobular carcinoma, grade 2 or 3, E-cadherin negative, estrogen receptor 90% positive, progesterone receptor 5% positive, both with strong staining intensity, with an MIB-1 of 20%, and no HER-2 amplification, the signals ratio being 0.82 and the number per cell 1.60.  Bilateral breast MRI was obtained 04/23/2016. This confirmed a 0.6 cm nodule in the upper-outer quadrant of the right breast with no additional sites of concern in either breast and no abnormal appearing lymph nodes.  Her subsequent history is as detailed below.  INTERVAL HISTORY: Candace [name] was evaluated in the breast clinic 05/02/2016. Her case was also presented in the multidisciplinary breast  cancer conference 04/17/2016. At that time a preliminary plan was proposed: Preop MRI, Oncotype, genetics, and most likely breast conserving surgery and sentinel lymph node sampling  REVIEW OF SYSTEMS: There were no specific symptoms leading to the original mammogram, which was routinely scheduled. The patient denies unusual headaches, visual changes, nausea, vomiting, stiff neck, dizziness, or gait imbalance. There has been no cough, phlegm production, or pleurisy, no chest pain or pressure, and no change in bowel or bladder habits. The patient denies fever, rash, bleeding, unexplained fatigue or unexplained weight loss. She exercises chiefly by playing tennis about twice a week and by walking. A detailed review of systems was otherwise entirely negative.   PAST MEDICAL HISTORY: Past Medical History:  Diagnosis Date  . Abnormal Pap smear   . Anovulation   . Depression   . Endometrial hyperplasia   . Endometrial polyp    h/o  . Epidermal cyst    h/o  . H/O hemorrhoids   . H/O menorrhagia   . High risk HPV infection   . Insomnia   . Oligomenorrhea   . Seasonal allergies     PAST SURGICAL HISTORY: Past Surgical History:  Procedure Laterality Date  . CARPAL TUNNEL RELEASE  03/26/2011   right hand  . CARPAL TUNNEL RELEASE  05/28/2011   Procedure: CARPAL TUNNEL RELEASE;  Surgeon: [name];  Location: [facility];  Service: Orthopedics;  Laterality: Left;  . CHOLECYSTECTOMY N/A 01/24/2013   Procedure: LAPAROSCOPIC CHOLECYSTECTOMY WITH INTRAOPERATIVE CHOLANGIOGRAM;  Surgeon: [name], MD;  Location: [facility];  Service: General;  Laterality: N/A;  . HYSTEROSCOPY    . POLYPECTOMY    . TONSILLECTOMY  1967   as child  . trigger fingers  8 yrs. ago   both done months apart  .  uterine ablation  06/04/2010    FAMILY HISTORY Family History  Problem Relation Age of Onset  . Heart disease Mother   . Hypertension Mother   . Stroke Mother   . Dementia Mother     vascular  dementia  . Pulmonary fibrosis Father     d. 35  . Depression Father   . Heart attack Maternal Grandmother     d. [phone]  . Heart disease Maternal Grandmother   . Heart disease Maternal Grandfather     d. 60-63  . Leukemia Paternal Grandmother     d. late 45s  . Breast cancer Sister 80    IDC s/p mastectomy and chemo  Both of the patient's parents were only children. Her father died at age 53 from idiopathic pulmonary fibrosis. The patient's mother died at the age of 58 in the setting of dementia. The patient has one brother and one sister. Her sister was diagnosed at age 70 with breast cancer (a T1b invasive ductal carcinoma just behind the nipple, requiring mastectomy. Her Oncotype was 24, and she received chemotherapy 3. She is now doing well).  GYNECOLOGIC HISTORY:  No LMP recorded. Patient has had an ablation. Menarche age 17, first live birth age ? She is GX P2. She stopped having periods December 2012 when she underwent endometrial ablation. 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 She is divorced and lives at home with her daughters appendectomy and Candace [name] (age 28 and 80 as of November 2017) and 2 dogs. The patient's was brought up as an [place].    ADVANCED DIRECTIVES: In place. The patient's Sr. Halford Decamp is her healthcare part of attorney   HEALTH MAINTENANCE: Social History  Substance Use Topics  . Smoking status: Never Smoker  . Smokeless tobacco: Never Used  . Alcohol use No     Comment: none since 1996     Colonoscopy:  PAP:  Bone density:   No Known Allergies  Current Outpatient Prescriptions  Medication Sig Dispense Refill  . BuPROPion HCl (WELLBUTRIN PO) Take by mouth.    . cetirizine (ZYRTEC) 10 MG tablet Take 10 mg by mouth daily as needed.     [name] FLUoxetine (PROZAC) 10 MG capsule Take 10 mg by  mouth daily. Pt takes at lunch    . Multiple Vitamins-Minerals (MULTIVITAMINS THER. W/MINERALS) TABS Take 1 tablet by mouth daily.     . Omega-3 Fatty Acids (FISH OIL PO) Take 2 capsules by mouth daily.    . Rosuvastatin Calcium (CRESTOR PO) Take by mouth.     No current facility-administered medications for this visit.     OBJECTIVE:Middle-aged white woman in no acute distress  Vitals:   05/02/16 1558  BP: (!) 150/82  Pulse: 90  Resp: 18  Temp: 98.4 F (36.9 C)     Body mass index is 37.73 kg/m.    ECOG FS:0 - Asymptomatic  Ocular: Sclerae unicteric, pupils equal, round and reactive to light Ear-nose-throat: Oropharynx clear and moist Lymphatic: No cervical or supraclavicular adenopathy Lungs no rales or rhonchi, good excursion bilaterally Heart regular rate and rhythm, no murmur appreciated Abd soft, nontender, positive bowel sounds MSK no focal spinal tenderness, no joint edema Neuro: non-focal, well-oriented, appropriate affect Breasts: The right breast is status post recent biopsy. There is no palpable mass. There are no skin or nipple changes of concern. The right  axilla is benign. The left breast is unremarkable.   LAB RESULTS:  CMP     Component Value Date/Time   NA 140 05/02/2016 1538   K 3.9 05/02/2016 1538   CL 99 01/24/2013 0316   CO2 28 05/02/2016 1538   GLUCOSE 109 05/02/2016 1538   BUN 12.4 05/02/2016 1538   CREATININE 0.8 05/02/2016 1538   CALCIUM 9.7 05/02/2016 1538   PROT 7.0 05/02/2016 1538   ALBUMIN 3.7 05/02/2016 1538   AST 20 05/02/2016 1538   ALT 32 05/02/2016 1538   ALKPHOS 83 05/02/2016 1538   BILITOT 0.27 05/02/2016 1538   GFRNONAA 83 (L) 01/24/2013 0316   GFRAA >90 01/24/2013 0316    INo results found for: SPEP, UPEP  Lab Results  Component Value Date   WBC 7.9 05/02/2016   NEUTROABS 4.1 05/02/2016   HGB 14.0 05/02/2016   HCT 42.3 05/02/2016   MCV 85.5 05/02/2016   PLT 269 05/02/2016      Chemistry      Component Value  Date/Time   NA 140 05/02/2016 1538   K 3.9 05/02/2016 1538   CL 99 01/24/2013 0316   CO2 28 05/02/2016 1538   BUN 12.4 05/02/2016 1538   CREATININE 0.8 05/02/2016 1538      Component Value Date/Time   CALCIUM 9.7 05/02/2016 1538   ALKPHOS 83 05/02/2016 1538   AST 20 05/02/2016 1538   ALT 32 05/02/2016 1538   BILITOT 0.27 05/02/2016 1538       No results found for: LABCA2  No components found for: LABCA125  No results for input(s): INR in the last 168 hours.  Urinalysis    Component Value Date/Time   COLORURINE YELLOW 01/24/2013 0327   APPEARANCEUR TURBID (A) 01/24/2013 0327   LABSPEC 1.021 01/24/2013 0327   PHURINE 7.5 01/24/2013 0327   GLUCOSEU NEGATIVE 01/24/2013 0327   HGBUR NEGATIVE 01/24/2013 0327   BILIRUBINUR NEGATIVE 01/24/2013 0327   KETONESUR 15 (A) 01/24/2013 0327   PROTEINUR NEGATIVE 01/24/2013 0327   UROBILINOGEN 0.2 01/24/2013 0327   NITRITE NEGATIVE 01/24/2013 0327   LEUKOCYTESUR NEGATIVE 01/24/2013 0327     STUDIES: Mr Breast Bilateral W Wo Contrast  Result Date: 04/24/2016 CLINICAL DATA:  Recent ultrasound-guided core biopsy of mass in the 10 o'clock location of the right breast reveals grade II-III invasive mammary carcinoma and mammary carcinoma in situ. E cadherin is negative, consistent with lobular phenotype. Estrogen progesterone receptors are positive. Ki 67 is 20%. HER-2/NEU is negative. LABS:  Not applicable EXAM: BILATERAL BREAST MRI WITH AND WITHOUT CONTRAST TECHNIQUE: Multiplanar, multisequence MR images of both breasts were obtained prior to and following the intravenous administration of 20 ml of MultiHance. THREE-DIMENSIONAL MR IMAGE RENDERING ON INDEPENDENT WORKSTATION: Three-dimensional MR images were rendered by post-processing of the original MR data on an independent workstation. The three-dimensional MR images were interpreted, and findings are reported in the following complete MRI report for this study. Three dimensional images  were evaluated at the independent DynaCad workstation COMPARISON:  Multiple recent exams including 04/23/2016 FINDINGS: Breast composition: b. Scattered fibroglandular tissue. Background parenchymal enhancement: Moderate Right breast: Within the upper-outer quadrant of the right breast there is a small nodule of enhancement measuring 6 mm. Nodule demonstrates rapid wash-in and washout type enhancement kinetics. This nodule is adjacent to tissue marker clip artifact following recent ultrasound-guided core biopsy which demonstrated invasive lobular carcinoma and LCIS. Scattered foci of enhancement are identified. No additional sites of concern are identified within the right breast. Left  breast: Scattered foci of enhancement are identified. No mass or abnormal enhancement. Lymph nodes: No abnormal appearing lymph nodes. Ancillary findings:  None. IMPRESSION: 1. Post biopsy change associated with 6 mm enhancing nodule in the upper-outer quadrant of the right breast consistent with known malignancy. 2. Scattered foci of enhancement throughout both breasts, without other additional sites of concern. 3. No imaging evidence for adenopathy. RECOMMENDATION: Treatment plan is recommended. BI-RADS CATEGORY  6: Known biopsy-proven malignancy. Electronically Signed   By: Nolon Nations M.D.   On: 04/24/2016 14:18   Mm Digital Diagnostic Unilat R  Result Date: 04/05/2016 CLINICAL DATA:  Status post ultrasound-guided biopsy of a right breast mass earlier today. EXAM: DIAGNOSTIC RIGHT MAMMOGRAM POST ULTRASOUND BIOPSY COMPARISON:  Previous exam(s). FINDINGS: Mammographic images were obtained following ultrasound guided biopsy of the right breast mass at the 10 o'clock axis. At the conclusion of the procedure, a ribbon shaped tissue marker was placed at the biopsy site. The biopsy clip is well positioned within the outer right breast, corresponding to the site of the original mammographic finding. IMPRESSION: Postprocedure  mammogram for clip placement. Biopsy clip is well positioned at the biopsy site, also corresponding to the site of the original mammographic finding. Final Assessment: Post Procedure Mammograms for Marker Placement Electronically Signed   By: Franki Cabot M.D.   On: 04/05/2016 10:04   US Breast Ltd Uni Right Inc Axilla  Addendum Date: 04/08/2016   ADDENDUM REPORT: 04/08/2016 11:53 ADDENDUM: Right axillary ultrasound was also performed showing no enlarged or morphologically abnormal lymph nodes. Electronically Signed   By: Franki Cabot M.D.   On: 04/08/2016 11:53   Addendum Date: 04/05/2016   ADDENDUM REPORT: 04/05/2016 08:47 ADDENDUM: On an exaggerated CC view with 3D tomosynthesis, the irregular mass is confirmed to be in the outer right breast at posterior depth. Electronically Signed   By: Franki Cabot M.D.   On: 04/05/2016 08:47   Result Date: 04/08/2016 CLINICAL DATA:  Patient returns today to evaluate a possible right breast mass identified on recent screening mammogram EXAM: 2D DIGITAL DIAGNOSTIC RIGHT MAMMOGRAM WITH CAD AND ADJUNCT TOMO ULTRASOUND RIGHT BREAST COMPARISON:  Previous exams including recent screening mammogram dated 03/27/2016. ACR Breast Density Category b: There are scattered areas of fibroglandular density. FINDINGS: On today's additional views with spot compression and 3D tomosynthesis, there is an oval mass confirmed within the upper right breast, upper outer quadrant based on tomosynthesis slice position, with irregular margins, measuring approximately 0.7 cm greatest dimension. Mammographic images were processed with CAD. Targeted ultrasound is performed, showing an irregular hypoechoic mass within the right breast at the 10 o'clock axis, 9 cm from the nipple, measuring 0.6 x 0.5 x 0.5 cm, anti parallel in orientation, a likely correlate for the mammographic finding. IMPRESSION: Irregular hypoechoic mass within the right breast at the 10 o'clock axis, 9 cm from the nipple,  measuring 0.6 x 0.5 x 0.5 cm, a likely correlate for the mammographic finding. This is a suspicious finding for which ultrasound-guided biopsy is recommended. RECOMMENDATION: Ultrasound-guided biopsy of the right breast mass. Ultrasound-guided biopsy is scheduled for 04/05/2016 at 8:30 a.m. I have discussed the findings and recommendations with the patient. Results were also provided in writing at the conclusion of the visit. If applicable, a reminder letter will be sent to the patient regarding the next appointment. BI-RADS CATEGORY  4: Suspicious. Electronically Signed: By: Franki Cabot M.D. On: 04/04/2016 13:51   Mm Diag Breast Tomo Uni Right  Addendum Date: 04/08/2016  ADDENDUM REPORT: 04/08/2016 11:53 ADDENDUM: Right axillary ultrasound was also performed showing no enlarged or morphologically abnormal lymph nodes. Electronically Signed   By: Franki Cabot M.D.   On: 04/08/2016 11:53   Addendum Date: 04/05/2016   ADDENDUM REPORT: 04/05/2016 08:47 ADDENDUM: On an exaggerated CC view with 3D tomosynthesis, the irregular mass is confirmed to be in the outer right breast at posterior depth. Electronically Signed   By: Franki Cabot M.D.   On: 04/05/2016 08:47   Result Date: 04/08/2016 CLINICAL DATA:  Patient returns today to evaluate a possible right breast mass identified on recent screening mammogram EXAM: 2D DIGITAL DIAGNOSTIC RIGHT MAMMOGRAM WITH CAD AND ADJUNCT TOMO ULTRASOUND RIGHT BREAST COMPARISON:  Previous exams including recent screening mammogram dated 03/27/2016. ACR Breast Density Category b: There are scattered areas of fibroglandular density. FINDINGS: On today's additional views with spot compression and 3D tomosynthesis, there is an oval mass confirmed within the upper right breast, upper outer quadrant based on tomosynthesis slice position, with irregular margins, measuring approximately 0.7 cm greatest dimension. Mammographic images were processed with CAD. Targeted ultrasound is  performed, showing an irregular hypoechoic mass within the right breast at the 10 o'clock axis, 9 cm from the nipple, measuring 0.6 x 0.5 x 0.5 cm, anti parallel in orientation, a likely correlate for the mammographic finding. IMPRESSION: Irregular hypoechoic mass within the right breast at the 10 o'clock axis, 9 cm from the nipple, measuring 0.6 x 0.5 x 0.5 cm, a likely correlate for the mammographic finding. This is a suspicious finding for which ultrasound-guided biopsy is recommended. RECOMMENDATION: Ultrasound-guided biopsy of the right breast mass. Ultrasound-guided biopsy is scheduled for 04/05/2016 at 8:30 a.m. I have discussed the findings and recommendations with the patient. Results were also provided in writing at the conclusion of the visit. If applicable, a reminder letter will be sent to the patient regarding the next appointment. BI-RADS CATEGORY  4: Suspicious. Electronically Signed: By: Franki Cabot M.D. On: 04/04/2016 13:51   [name] Rt Breast Bx W Loc Dev 1st Lesion Img Bx Spec US Guide  Addendum Date: 04/08/2016   ADDENDUM REPORT: 04/08/2016 11:57 ADDENDUM: Pathology revealed grade II to III invasive ductal carcinoma and intermediate grade ductal carcinoma in situ in the right breast. This was found to be concordant by Dr. Franki Cabot. Pathology results were discussed with the patient by telephone. The patient reported doing well after the biopsy with tenderness and bruising at the site. Post biopsy instructions and care were reviewed and questions were answered. The patient was encouraged to call [facility] for any additional concerns. Surgical consultation has been arranged with Dr. [name] at [facility] on April 24, 2016, at the request of the patient. Pathology results reported by Susa Raring RN, BSN on 04/08/2016. Electronically Signed   By: Franki Cabot M.D.   On: 04/08/2016 11:57   Result Date: 04/08/2016 CLINICAL  DATA:  Patient with right breast mass presents today for ultrasound-guided core biopsy. EXAM: ULTRASOUND GUIDED RIGHT BREAST CORE NEEDLE BIOPSY COMPARISON:  Previous exam(s). PROCEDURE: I met with the patient and we discussed the procedure of ultrasound-guided biopsy, including benefits and alternatives. We discussed the high likelihood of a successful procedure. We discussed the risks of the procedure including infection, bleeding, tissue injury, clip migration, and inadequate sampling. Informed written consent was given. The usual time-out protocol was performed immediately prior to the procedure. Using sterile technique and 1% Lidocaine as local anesthetic, under direct ultrasound  visualization, a 12 gauge spring-loaded device was used to perform biopsy of the right breast mass at the 10 o'clock axisusing a lateral approach. At the conclusion of the procedure, a ribbon shaped tissue marker clip was deployed into the biopsy cavity. Follow-up 2-view mammogram was performed and dictated separately. IMPRESSION: Ultrasound-guided biopsy of the right breast mass at the 10 o'clock axis. No apparent complications. Electronically Signed: By: Franki Cabot M.D. On: 04/05/2016 09:30    ELIGIBLE FOR AVAILABLE RESEARCH PROTOCOL: no  ASSESSMENT: 55 y.o. [place] woman status post right breast upper outer quadrant biopsy 04/05/2016 for a clinical T1b N0, stage IA invasive lobular breast cancer, grade 2 or 3, estrogen and progesterone receptor positive, HER-2 not amplified, with an MIB-1 of 20%  (1) genetics testing sent 04/23/2016  (2) definitive surgery pending (considering bilateral mastectomies if mutation positive)  (3) Oncotype testing depending on final surgical measurements  (4) adjuvant radiation as appropriate  (5) anti-estrogens to follow at the completion of local treatment.  PLAN: We spent the better part of today's hour-long appointment discussing the biology of breast cancer in general, and  the specifics of the patient's tumor in particular. We first reviewed the fact that cancer is not one disease but more than 100 different diseases and that it is important to keep them separate-- otherwise when friends and relatives discuss their own cancer experiences with [place] confusion can result. Similarly we explained that if breast cancer spreads to the bone or liver, the patient would not have bone cancer or liver cancer, but breast cancer in the bone and breast cancer in the liver: one cancer in three places-- not 3 different cancers which otherwise would have to be treated in 3 different ways.  We discussed the difference between local and systemic therapy. In terms of loco-regional treatment, lumpectomy plus radiation is equivalent to mastectomy as far as survival is concerned. For this reason, and because the cosmetic results are generally superior, we recommend breast conserving surgery. We also noted that in terms of sequencing of treatments, whether systemic therapy or surgery is done first does not affect the ultimate outcome.  We then discussed the rationale for systemic therapy. There is some risk that this cancer may have already spread to other parts of her body. Patients frequently ask at this point about bone scans, CAT scans and PET scans to find out if they have occult breast cancer somewhere else. The problem is that in early stage disease we are much more likely to find false positives then true cancers and this would expose the patient to unnecessary procedures as well as unnecessary radiation. Scans cannot answer the question the patient really would like to know, which is whether she has microscopic disease elsewhere in her body. For those reasons we do not recommend them.  Of course we would proceed to aggressive evaluation of any symptoms that might suggest metastatic disease, but that is not the case here.  Next we went over the options for systemic therapy which are  anti-estrogens, anti-HER-2 immunotherapy, and chemotherapy. Eldoris does not meet criteria for anti-HER-2 immunotherapy. She is a good candidate for anti-estrogens.  The question of chemotherapy is more complicated. Chemotherapy is most effective in rapidly growing, aggressive tumors. It is much less effective in low-grade, slow growing cancers. [name] 's tumor is right in between. . For that reason we are going to request an Oncotype from the definitive surgical sample, as suggested by NCCN guidelines. That will help [name] make a definitive  decision regarding chemotherapy in this case. (She understands that if the total dimension of the tumor including the initial biopsy is 5 mm or less, we will not send an Oncotype and she will not need chemotherapy)  Alethia does qualify for genetics testing and this is in process. We should have that answer within the next week. If she does carry a deleterious mutation she would much prefer bilateral mastectomies to intensified screening, although we discussed both alternatives today. She also would want reconstruction. That means she would have to meet with plastics and make a serious of complex decisions which would take time. In that case I would start her on anti-estrogens neoadjuvantly so that there would be no need to rush to a decision.  More likely of course she will not carry a mutation we can identify. In that case she is agreeable to breast conserving surgery with sentinel lymph node sampling. I anticipate that would happen some time later this month  Accordingly I am going to see her again early December. We should have the Oncotype results by then note that the patient does have a cruise planned the first week in January and she is hoping not to have to cancel that. That may affect chemotherapy and or radiation timing   Ivyanna has a good understanding of the overall plan. She agrees with it. She knows the goal of treatment in her case is cure. She will call  with any problems that may develop before her next visit here.  Chauncey Cruel, MD   05/02/2016 5:33 PM Medical Oncology and Hematology [facility] [address] [place], [place] [address] Tel. [phone]    Fax. [phone]

## 2016-05-03 ENCOUNTER — Telehealth: Payer: Self-pay | Admitting: *Deleted

## 2016-05-03 NOTE — Telephone Encounter (Signed)
  Oncology Nurse Navigator Documentation  Navigator Location: [facility] (05/03/16 1500) Referral date to RadOnc/MedOnc: 04/22/16 (05/03/16 1500) )Navigator Encounter Type: Introductory phone call (05/03/16 1500)   Abnormal Finding Date: 03/27/16 (05/03/16 1500) Confirmed Diagnosis Date: 04/05/16 (05/03/16 1500)   Genetic Counseling Date: 04/23/16 (05/03/16 1500) Genetic Counseling Type: Urgent (05/03/16 1500)         Patient Visit Type: MedOnc;Initial (05/03/16 1500) Treatment Phase: Pre-Tx/Tx Discussion (05/03/16 1500) Barriers/Navigation Needs: No barriers at this time (05/03/16 1500)                Acuity: Level 2 (05/03/16 1500)   Acuity Level 2: Initial guidance, education and coordination as needed;Educational needs;Assistance expediting appointments;Ongoing guidance and education throughout treatment as needed (05/03/16 1500)     Time Spent with Patient: 15 (05/03/16 1500)

## 2016-05-07 ENCOUNTER — Telehealth: Payer: Self-pay | Admitting: Genetic Counselor

## 2016-05-07 NOTE — Telephone Encounter (Signed)
Discussed with Candace [name] that her genetic test results were negative for mutations within any of 20 genes on the Breast/Ovarian Cancer Panel through [facility].  Additionally, no variants of uncertain significance (VUSes) were found.  Discussed that this is most likely a reassuring result for [name], although we can never totally rule out a genetic cause since our testing may not be perfect at this point in time. Candace [name] should continue to follow her doctors' recommendations for cancer screening.  We discussed that her daughters and nieces are still at some increased risk for breast cancer due to the family history.  They can begin mammogram screening at 39y.  Candace [name] sister is eligible for genetic testing if interested.  Candace [name] is happy to receive this news and she lets me know that she has a lumpectomy scheduled this month.  She is welcome to call with any questions.  She would like a copy of her results emailed and I am happy to do that.

## 2016-05-08 ENCOUNTER — Ambulatory Visit: Payer: Self-pay | Admitting: Genetic Counselor

## 2016-05-08 DIAGNOSIS — Z170 Estrogen receptor positive status [ER+]: Secondary | ICD-10-CM

## 2016-05-08 DIAGNOSIS — Z1379 Encounter for other screening for genetic and chromosomal anomalies: Secondary | ICD-10-CM

## 2016-05-08 DIAGNOSIS — Z806 Family history of leukemia: Secondary | ICD-10-CM

## 2016-05-08 DIAGNOSIS — Z803 Family history of malignant neoplasm of breast: Secondary | ICD-10-CM

## 2016-05-08 DIAGNOSIS — C50411 Malignant neoplasm of upper-outer quadrant of right female breast: Secondary | ICD-10-CM

## 2016-05-09 ENCOUNTER — Telehealth: Payer: Self-pay | Admitting: Oncology

## 2016-05-09 ENCOUNTER — Other Ambulatory Visit: Payer: Self-pay | Admitting: General Surgery

## 2016-05-09 DIAGNOSIS — C50411 Malignant neoplasm of upper-outer quadrant of right female breast: Secondary | ICD-10-CM

## 2016-05-09 DIAGNOSIS — Z170 Estrogen receptor positive status [ER+]: Principal | ICD-10-CM

## 2016-05-09 NOTE — Telephone Encounter (Signed)
lvm to inform pt of r/s 12/1 appt to 12/18 per LOS

## 2016-05-13 DIAGNOSIS — Z1379 Encounter for other screening for genetic and chromosomal anomalies: Secondary | ICD-10-CM | POA: Insufficient documentation

## 2016-05-13 NOTE — Progress Notes (Signed)
GENETIC TEST RESULT  HPI: Candace [name] was previously seen in the [facility] clinic due to a personal and family history of breast cancer and concerns regarding a hereditary predisposition to cancer. Please refer to our prior cancer genetics clinic note from April 23, 2016 for more information regarding Candace [name]'s medical, social and family histories, and our assessment and recommendations, at the time. Candace [name] recent genetic test results were disclosed to her, as were recommendations warranted by these results. These results and recommendations are discussed in more detail below.  GENETIC TEST RESULTS: At the time of Candace [name] visit on 04/23/16, we recommended she pursue genetic testing of the 20-gene Breast/Ovarian Cancer Panel through [facility] [employer] [facility]. The Breast/Ovarian Cancer Panel offered by [facility] Hope Pigeon, MD) includes sequencing and deletion/duplication analysis for the following 19 genes:  ATM, BARD1, BRCA1, BRCA2, BRIP1, CDH1, CHEK2, FANCC, MLH1, MSH2, MSH6, NBN, PALB2, PMS2, PTEN, RAD51C, RAD51D, TP53, and XRCC2.  This panel also includes deletion/duplication analysis (without sequencing) for one gene, EPCAM.  Those results are now back, the report date for which is April 23, 2016.  Genetic testing was normal, and did not reveal a deleterious mutation in these genes.  Additionally, no variants of uncertain significance (VUSes) were found.  The test report will be scanned into EPIC and will be located under the Results Review tab in the Pathology>Molecular Pathology section.   We discussed with Candace [name] that since the current genetic testing is not perfect, it is possible there may be a gene mutation in one of these genes that current testing cannot detect, but that chance is small. We also discussed, that it is possible that another gene that has not yet been discovered, or that we have not yet tested, is responsible for the cancer diagnoses in the family,  and it is, therefore, important to remain in touch with cancer genetics in the future so that we can continue to offer Candace [name] the most up-to-date genetic testing.   CANCER SCREENING RECOMMENDATIONS: While we still do not have an explanation for the personal and family history of breast cancer, this result may be reassuring and indicate that Candace [name] likely does not have an increased risk for a future cancer due to a mutation in one of these genes. This normal test also suggests that Candace [name]'s cancer was most likely not due to an inherited predisposition associated with one of these genes.  Most cancers happen by chance and this negative test suggests that her cancer falls into this category.  We, therefore, recommended she continue to follow the cancer management and screening guidelines provided by her oncology and primary healthcare providers.   RECOMMENDATIONS FOR FAMILY MEMBERS: Women in this family might be at some increased risk of developing breast cancer, over the general population risk, simply due to the family history of breast cancer. We recommended women in this family have a yearly mammogram beginning at age 53, or 60 years younger than the earliest onset of cancer, an annual clinical breast exam, and perform monthly breast self-exams. Candace [name] daughters and nieces should begin getting mammograms at the age of 27 based on current recommendations.  Women in this family should also have a gynecological exam as recommended by their primary provider. All family members should have a colonoscopy by age 54.  Based on Candace [name] family history, we recommended her sister, who was diagnosed with breast cancer at age 76, have genetic counseling and testing. Candace [name] will let us  know if we can be of any assistance in coordinating genetic counseling and/or testing for this family member.   FOLLOW-UP: Lastly, we discussed with Candace [name] that cancer genetics is a rapidly advancing field and it is possible  that new genetic tests will be appropriate for her and/or her family members in the future. We encouraged her to remain in contact with cancer genetics on an annual basis so we can update her personal and family histories and let her know of advances in cancer genetics that may benefit this family.   Our contact number was provided. Candace [name] questions were answered to her satisfaction, and she knows she is welcome to call us at anytime with additional questions or concerns.   Jeanine Luz, MS, [facility] Certified Genetic Counselor [email or web address] Phone: [phone]

## 2016-05-21 MED FILL — rOPINIRole HCL 0.25 MG TABS: 0.25 | 90 days supply | Qty: 90 | Fill #1

## 2016-05-22 ENCOUNTER — Encounter (HOSPITAL_COMMUNITY): Payer: Self-pay

## 2016-05-22 ENCOUNTER — Other Ambulatory Visit (HOSPITAL_COMMUNITY): Payer: Self-pay | Admitting: *Deleted

## 2016-05-22 ENCOUNTER — Encounter (HOSPITAL_COMMUNITY)
Admission: RE | Admit: 2016-05-22 | Discharge: 2016-05-22 | Disposition: A | Discharge status: Home / Self Care | Payer: 59 | Source: Ambulatory Visit | Attending: General Surgery | Admitting: General Surgery

## 2016-05-22 DIAGNOSIS — Z01812 Encounter for preprocedural laboratory examination: Secondary | ICD-10-CM | POA: Diagnosis not present

## 2016-05-22 DIAGNOSIS — N915 Oligomenorrhea, unspecified: Secondary | ICD-10-CM | POA: Diagnosis not present

## 2016-05-22 DIAGNOSIS — C50411 Malignant neoplasm of upper-outer quadrant of right female breast: Secondary | ICD-10-CM | POA: Diagnosis not present

## 2016-05-22 DIAGNOSIS — N8500 Endometrial hyperplasia, unspecified: Secondary | ICD-10-CM | POA: Insufficient documentation

## 2016-05-22 DIAGNOSIS — Z803 Family history of malignant neoplasm of breast: Secondary | ICD-10-CM | POA: Insufficient documentation

## 2016-05-22 HISTORY — DX: Adverse effect of unspecified anesthetic, initial encounter: T41.45XA

## 2016-05-22 HISTORY — DX: Restless legs syndrome: G25.81

## 2016-05-22 HISTORY — DX: Malignant (primary) neoplasm, unspecified: C80.1

## 2016-05-22 HISTORY — DX: Other complications of anesthesia, initial encounter: T88.59XA

## 2016-05-22 HISTORY — DX: Gastro-esophageal reflux disease without esophagitis: K21.9

## 2016-05-22 LAB — CBC
HEMATOCRIT: 42.3 % (ref 36.0–46.0)
Hemoglobin: 14.2 g/dL (ref 12.0–15.0)
MCH: 28.8 pg (ref 26.0–34.0)
MCHC: 33.6 g/dL (ref 30.0–36.0)
MCV: 85.8 fL (ref 78.0–100.0)
Platelets: 271 10*3/uL (ref 150–400)
RBC: 4.93 MIL/uL (ref 3.87–5.11)
RDW: 12.2 % (ref 11.5–15.5)
WBC: 7.3 10*3/uL (ref 4.0–10.5)

## 2016-05-22 NOTE — Pre-Procedure Instructions (Addendum)
Candace [name]  05/22/2016     Your procedure is scheduled on Tuesday, May 28, 2016 at 7:30 AM.   Report to [facility] Entrance "A" Admitting Office at 5:30 AM.   Call this number if you have problems the morning of surgery: [phone]   Questions prior to day of surgery, please call [phone] between 8 & 4 PM.   Remember:  Do not eat food or drink liquids after midnight Monday, 05/27/16.  Take these medicines the morning of surgery with A SIP OF WATER: Bupropion (Wellbutrin), Cetirizine (Zyrtec), Esomeprazole (Nexium)  Stop NSAIDS (Ibuprofen, Aleve, etc.), and multivitamins as of today. Do not use Aspirin products prior to surgery.  Drink Boost 2 hours prior to arrival day of surgery.   Do not wear jewelry, make-up or nail polish.  Do not wear lotions, powders, perfumes, or deodorant.  Do not shave 48 hours prior to surgery.   Do not bring valuables to the hospital.  [facility] is not responsible for any belongings or valuables.  Contacts, dentures or bridgework may not be worn into surgery.  Leave your suitcase in the car.  After surgery it may be brought to your room.  For patients admitted to the hospital, discharge time will be determined by your treatment team.  Patients discharged the day of surgery will not be allowed to drive home.   Special instructions:  [facility] - Preparing for Surgery  Before surgery, you can play an important role.  Because skin is not sterile, your skin needs to be as free of germs as possible.  You can reduce the number of germs on you skin by washing with CHG (chlorahexidine gluconate) soap before surgery.  CHG is an antiseptic cleaner which kills germs and bonds with the skin to continue killing germs even after washing.  Please DO NOT use if you have an allergy to CHG or antibacterial soaps.  If your skin becomes reddened/irritated stop using the CHG and inform your nurse when you arrive at Short Stay.  Do not shave  (including legs and underarms) for at least 48 hours prior to the first CHG shower.  You may shave your face.  Please follow these instructions carefully:   1.  Shower with CHG Soap the night before surgery and the                    morning of Surgery.  2.  If you choose to wash your hair, wash your hair first as usual with your       normal shampoo.  3.  After you shampoo, rinse your hair and body thoroughly to remove the shampoo.  4.  Use CHG as you would any other liquid soap.  You can apply chg directly       to the skin and wash gently with scrungie or a clean washcloth.  5.  Apply the CHG Soap to your body ONLY FROM THE NECK DOWN.        Do not use on open wounds or open sores.  Avoid contact with your eyes, ears, mouth and genitals (private parts).  Wash genitals (private parts) with your normal soap.  6.  Wash thoroughly, paying special attention to the area where your surgery        will be performed.  7.  Thoroughly rinse your body with warm water from the neck down.  8.  DO NOT shower/wash with your normal soap after using and rinsing  off       the CHG Soap.  9.  Pat yourself dry with a clean towel.            10.  Wear clean pajamas.            11.  Place clean sheets on your bed the night of your first shower and do not        sleep with pets.  Day of Surgery  Do not apply any lotions/deodorants the morning of surgery.  Please wear clean clothes to the hospital.   Please read over the following fact sheets that you were given.

## 2016-05-22 NOTE — Progress Notes (Signed)
Pt denies cardiac history, chest pain or sob. 

## 2016-05-22 NOTE — Progress Notes (Signed)
Called Dr. Cristal Generous office and spoke with April, triage nurse. Requested that Dr. Donne Hazel put in the Physician Attestation order in [facility].

## 2016-05-27 ENCOUNTER — Ambulatory Visit
Admission: RE | Admit: 2016-05-27 | Discharge: 2016-05-27 | Disposition: A | Discharge status: Home / Self Care | Payer: 59 | Source: Ambulatory Visit | Attending: General Surgery | Admitting: General Surgery

## 2016-05-27 DIAGNOSIS — N6310 Unspecified lump in the right breast, unspecified quadrant: Secondary | ICD-10-CM | POA: Diagnosis not present

## 2016-05-27 DIAGNOSIS — C50411 Malignant neoplasm of upper-outer quadrant of right female breast: Secondary | ICD-10-CM

## 2016-05-27 DIAGNOSIS — Z170 Estrogen receptor positive status [ER+]: Principal | ICD-10-CM

## 2016-05-28 ENCOUNTER — Ambulatory Visit (HOSPITAL_COMMUNITY)
Admission: RE | Admit: 2016-05-28 | Discharge: 2016-05-28 | Disposition: A | Discharge status: Home / Self Care | Payer: 59 | Source: Ambulatory Visit | Attending: General Surgery | Admitting: General Surgery

## 2016-05-28 ENCOUNTER — Encounter (HOSPITAL_COMMUNITY)
Admission: RE | Disposition: A | Discharge status: Home / Self Care | Payer: Self-pay | Source: Ambulatory Visit | Attending: General Surgery

## 2016-05-28 ENCOUNTER — Encounter (HOSPITAL_COMMUNITY): Payer: Self-pay | Admitting: *Deleted

## 2016-05-28 ENCOUNTER — Ambulatory Visit (HOSPITAL_COMMUNITY): Payer: 59 | Admitting: Emergency Medicine

## 2016-05-28 ENCOUNTER — Ambulatory Visit (HOSPITAL_COMMUNITY): Payer: 59 | Admitting: Certified Registered Nurse Anesthetist

## 2016-05-28 ENCOUNTER — Ambulatory Visit
Admission: RE | Admit: 2016-05-28 | Discharge: 2016-05-28 | Disposition: A | Discharge status: Home / Self Care | Payer: 59 | Source: Ambulatory Visit | Attending: General Surgery | Admitting: General Surgery

## 2016-05-28 DIAGNOSIS — C50411 Malignant neoplasm of upper-outer quadrant of right female breast: Secondary | ICD-10-CM

## 2016-05-28 DIAGNOSIS — Z803 Family history of malignant neoplasm of breast: Secondary | ICD-10-CM | POA: Insufficient documentation

## 2016-05-28 DIAGNOSIS — Z6837 Body mass index (BMI) 37.0-37.9, adult: Secondary | ICD-10-CM | POA: Diagnosis not present

## 2016-05-28 DIAGNOSIS — E7800 Pure hypercholesterolemia, unspecified: Secondary | ICD-10-CM | POA: Diagnosis not present

## 2016-05-28 DIAGNOSIS — C50911 Malignant neoplasm of unspecified site of right female breast: Secondary | ICD-10-CM | POA: Diagnosis not present

## 2016-05-28 DIAGNOSIS — F329 Major depressive disorder, single episode, unspecified: Secondary | ICD-10-CM | POA: Insufficient documentation

## 2016-05-28 DIAGNOSIS — G709 Myoneural disorder, unspecified: Secondary | ICD-10-CM | POA: Insufficient documentation

## 2016-05-28 DIAGNOSIS — Z170 Estrogen receptor positive status [ER+]: Secondary | ICD-10-CM | POA: Diagnosis not present

## 2016-05-28 DIAGNOSIS — N8500 Endometrial hyperplasia, unspecified: Secondary | ICD-10-CM | POA: Diagnosis not present

## 2016-05-28 DIAGNOSIS — G5602 Carpal tunnel syndrome, left upper limb: Secondary | ICD-10-CM | POA: Diagnosis not present

## 2016-05-28 DIAGNOSIS — G8918 Other acute postprocedural pain: Secondary | ICD-10-CM | POA: Diagnosis not present

## 2016-05-28 DIAGNOSIS — K219 Gastro-esophageal reflux disease without esophagitis: Secondary | ICD-10-CM | POA: Insufficient documentation

## 2016-05-28 DIAGNOSIS — R928 Other abnormal and inconclusive findings on diagnostic imaging of breast: Secondary | ICD-10-CM | POA: Diagnosis not present

## 2016-05-28 HISTORY — PX: BREAST LUMPECTOMY WITH RADIOACTIVE SEED AND SENTINEL LYMPH NODE BIOPSY: SHX6550

## 2016-05-28 SURGERY — BREAST LUMPECTOMY WITH RADIOACTIVE SEED AND SENTINEL LYMPH NODE BIOPSY
Anesthesia: Regional | Site: Breast | Laterality: Right

## 2016-05-28 MED ORDER — FENTANYL CITRATE (PF) 100 MCG/2ML IJ SOLN
25.0000 ug | INTRAMUSCULAR | Status: DC | PRN
  Administered 2016-05-28: 50 ug via INTRAVENOUS

## 2016-05-28 MED ORDER — ONDANSETRON HCL 4 MG/2ML IJ SOLN
INTRAMUSCULAR | Status: DC | PRN
  Administered 2016-05-28: 4 mg via INTRAVENOUS

## 2016-05-28 MED ORDER — PROMETHAZINE HCL 25 MG/ML IJ SOLN
INTRAMUSCULAR | Status: AC
  Filled 2016-05-28: qty 1

## 2016-05-28 MED ORDER — LACTATED RINGERS IV SOLN
INTRAVENOUS | Status: DC | PRN
  Administered 2016-05-28 (×2): via INTRAVENOUS

## 2016-05-28 MED ORDER — TECHNETIUM TC 99M SULFUR COLLOID FILTERED
1.0000 | Freq: Once | INTRAVENOUS | Status: AC | PRN
  Administered 2016-05-28: 1 via INTRADERMAL

## 2016-05-28 MED ORDER — OXYCODONE-ACETAMINOPHEN 10-325 MG PO TABS: 1.0000 | ORAL_TABLET | Freq: Four times a day (QID) | ORAL | 0 refills | Status: DC | PRN

## 2016-05-28 MED ORDER — FENTANYL CITRATE (PF) 100 MCG/2ML IJ SOLN
INTRAMUSCULAR | Status: AC
  Filled 2016-05-28: qty 4

## 2016-05-28 MED ORDER — LORAZEPAM 2 MG/ML IJ SOLN
INTRAMUSCULAR | Status: AC
  Filled 2016-05-28: qty 1

## 2016-05-28 MED ORDER — SODIUM CHLORIDE 0.9% FLUSH: 3.0000 mL | Freq: Two times a day (BID) | INTRAVENOUS | Status: DC

## 2016-05-28 MED ORDER — PROPOFOL 10 MG/ML IV BOLUS
INTRAVENOUS | Status: DC | PRN
  Administered 2016-05-28: 50 mg via INTRAVENOUS
  Administered 2016-05-28: 150 mg via INTRAVENOUS
  Administered 2016-05-28: 50 mg via INTRAVENOUS

## 2016-05-28 MED ORDER — GABAPENTIN 300 MG PO CAPS
300.0000 mg | ORAL_CAPSULE | ORAL | Status: AC
  Administered 2016-05-28: 300 mg via ORAL
  Filled 2016-05-28: qty 1

## 2016-05-28 MED ORDER — BUPIVACAINE HCL 0.25 % IJ SOLN
INTRAMUSCULAR | Status: DC | PRN
  Administered 2016-05-28: 5 mL

## 2016-05-28 MED ORDER — OXYCODONE HCL 5 MG PO TABS: 5.0000 mg | ORAL_TABLET | ORAL | Status: DC | PRN

## 2016-05-28 MED ORDER — ONDANSETRON HCL 4 MG/2ML IJ SOLN
INTRAMUSCULAR | Status: AC
  Filled 2016-05-28: qty 2

## 2016-05-28 MED ORDER — OXYCODONE HCL 5 MG PO TABS: 5.0000 mg | ORAL_TABLET | Freq: Once | ORAL | Status: DC | PRN

## 2016-05-28 MED ORDER — LIDOCAINE 2% (20 MG/ML) 5 ML SYRINGE
INTRAMUSCULAR | Status: AC
  Filled 2016-05-28: qty 5

## 2016-05-28 MED ORDER — ACETAMINOPHEN 650 MG RE SUPP
650.0000 mg | RECTAL | Status: DC | PRN
  Filled 2016-05-28: qty 1

## 2016-05-28 MED ORDER — MORPHINE SULFATE (PF) 2 MG/ML IV SOLN: 2.0000 mg | INTRAVENOUS | Status: DC | PRN

## 2016-05-28 MED ORDER — SODIUM CHLORIDE 0.9 % IJ SOLN
INTRAMUSCULAR | Status: AC
  Filled 2016-05-28: qty 10

## 2016-05-28 MED ORDER — OXYCODONE HCL 5 MG/5ML PO SOLN: 5.0000 mg | Freq: Once | ORAL | Status: DC | PRN

## 2016-05-28 MED ORDER — FENTANYL CITRATE (PF) 100 MCG/2ML IJ SOLN
INTRAMUSCULAR | Status: AC
  Filled 2016-05-28: qty 2

## 2016-05-28 MED ORDER — MIDAZOLAM HCL 2 MG/2ML IJ SOLN
INTRAMUSCULAR | Status: AC
  Filled 2016-05-28: qty 2

## 2016-05-28 MED ORDER — SODIUM CHLORIDE 0.9 % IV SOLN: INTRAVENOUS | Status: DC

## 2016-05-28 MED ORDER — METHYLENE BLUE 0.5 % INJ SOLN
INTRAVENOUS | Status: AC
  Filled 2016-05-28: qty 10

## 2016-05-28 MED ORDER — FENTANYL CITRATE (PF) 100 MCG/2ML IJ SOLN
INTRAMUSCULAR | Status: DC | PRN
  Administered 2016-05-28: 25 ug via INTRAVENOUS
  Administered 2016-05-28 (×2): 50 ug via INTRAVENOUS
  Administered 2016-05-28: 25 ug via INTRAVENOUS

## 2016-05-28 MED ORDER — PROMETHAZINE HCL 25 MG/ML IJ SOLN
INTRAMUSCULAR | Status: DC | PRN
  Administered 2016-05-28: 25 mg via INTRAVENOUS

## 2016-05-28 MED ORDER — PROPOFOL 10 MG/ML IV BOLUS
INTRAVENOUS | Status: AC
  Filled 2016-05-28: qty 40

## 2016-05-28 MED ORDER — BUPIVACAINE-EPINEPHRINE (PF) 0.5% -1:200000 IJ SOLN
INTRAMUSCULAR | Status: DC | PRN
  Administered 2016-05-28: 30 mL via PERINEURAL

## 2016-05-28 MED ORDER — MIDAZOLAM HCL 2 MG/2ML IJ SOLN
INTRAMUSCULAR | Status: DC | PRN
  Administered 2016-05-28: 2 mg via INTRAVENOUS

## 2016-05-28 MED ORDER — 0.9 % SODIUM CHLORIDE (POUR BTL) OPTIME
TOPICAL | Status: DC | PRN
  Administered 2016-05-28: 1000 mL

## 2016-05-28 MED ORDER — LORAZEPAM 2 MG/ML IJ SOLN
0.5000 mg | Freq: Once | INTRAMUSCULAR | Status: AC
  Administered 2016-05-28: 0.5 mg via INTRAVENOUS

## 2016-05-28 MED ORDER — SODIUM CHLORIDE 0.9 % IV SOLN: 250.0000 mL | INTRAVENOUS | Status: DC | PRN

## 2016-05-28 MED ORDER — LIDOCAINE 2% (20 MG/ML) 5 ML SYRINGE
INTRAMUSCULAR | Status: DC | PRN
  Administered 2016-05-28: 60 mg via INTRAVENOUS
  Administered 2016-05-28: 40 mg via INTRAVENOUS

## 2016-05-28 MED ORDER — ROCURONIUM BROMIDE 10 MG/ML (PF) SYRINGE
PREFILLED_SYRINGE | INTRAVENOUS | Status: AC
  Filled 2016-05-28: qty 10

## 2016-05-28 MED ORDER — ACETAMINOPHEN 325 MG PO TABS
650.0000 mg | ORAL_TABLET | ORAL | Status: DC | PRN
  Filled 2016-05-28: qty 2

## 2016-05-28 MED ORDER — CEFAZOLIN SODIUM-DEXTROSE 2-4 GM/100ML-% IV SOLN
2.0000 g | INTRAVENOUS | Status: AC
  Administered 2016-05-28: 2 g via INTRAVENOUS
  Filled 2016-05-28: qty 100

## 2016-05-28 MED ORDER — SODIUM CHLORIDE 0.9% FLUSH: 3.0000 mL | INTRAVENOUS | Status: DC | PRN

## 2016-05-28 MED ORDER — ACETAMINOPHEN 500 MG PO TABS
1000.0000 mg | ORAL_TABLET | ORAL | Status: AC
  Administered 2016-05-28: 1000 mg via ORAL
  Filled 2016-05-28: qty 2

## 2016-05-28 MED ORDER — BUPIVACAINE HCL (PF) 0.25 % IJ SOLN
INTRAMUSCULAR | Status: AC
  Filled 2016-05-28: qty 30

## 2016-05-28 MED FILL — OXYCODONE-APAP 10-325: 10-325 | 5 days supply | Qty: 20 | Fill #0

## 2016-05-28 SURGICAL SUPPLY — 57 items
ADH SKN CLS APL DERMABOND .7 (GAUZE/BANDAGES/DRESSINGS) ×1
APPLIER CLIP 9.375 MED OPEN (MISCELLANEOUS) ×2
APR CLP MED 9.3 20 MLT OPN (MISCELLANEOUS) ×1
BINDER BREAST LRG (GAUZE/BANDAGES/DRESSINGS) IMPLANT
BINDER BREAST XLRG (GAUZE/BANDAGES/DRESSINGS) IMPLANT
BLADE SURG 15 STRL LF DISP TIS (BLADE) ×1 IMPLANT
BLADE SURG 15 STRL SS (BLADE) ×4
CANISTER SUCTION 2500CC (MISCELLANEOUS) ×2 IMPLANT
CHLORAPREP W/TINT 26ML (MISCELLANEOUS) ×2 IMPLANT
CLIP APPLIE 9.375 MED OPEN (MISCELLANEOUS) ×1 IMPLANT
CONT SPEC 4OZ CLIKSEAL STRL BL (MISCELLANEOUS) ×5 IMPLANT
COVER PROBE W GEL 5X96 (DRAPES) ×2 IMPLANT
COVER SURGICAL LIGHT HANDLE (MISCELLANEOUS) ×2 IMPLANT
DERMABOND ADVANCED (GAUZE/BANDAGES/DRESSINGS) ×1
DERMABOND ADVANCED .7 DNX12 (GAUZE/BANDAGES/DRESSINGS) ×1 IMPLANT
DEVICE DUBIN SPECIMEN MAMMOGRA (MISCELLANEOUS) ×2 IMPLANT
DRAPE CHEST BREAST 15X10 FENES (DRAPES) ×2 IMPLANT
DRAPE UTILITY XL STRL (DRAPES) ×2 IMPLANT
ELECT COATED BLADE 2.86 ST (ELECTRODE) ×2 IMPLANT
ELECT REM PT RETURN 9FT ADLT (ELECTROSURGICAL) ×2
ELECTRODE REM PT RTRN 9FT ADLT (ELECTROSURGICAL) ×1 IMPLANT
GLOVE BIO SURGEON STRL SZ7 (GLOVE) ×3 IMPLANT
GLOVE BIO SURGEON STRL SZ8 (GLOVE) ×1 IMPLANT
GLOVE BIOGEL PI IND STRL 7.5 (GLOVE) ×1 IMPLANT
GLOVE BIOGEL PI IND STRL 8.5 (GLOVE) IMPLANT
GLOVE BIOGEL PI INDICATOR 7.5 (GLOVE) ×1
GLOVE BIOGEL PI INDICATOR 8.5 (GLOVE) ×1
GOWN STRL REUS W/ TWL LRG LVL3 (GOWN DISPOSABLE) ×2 IMPLANT
GOWN STRL REUS W/TWL LRG LVL3 (GOWN DISPOSABLE) ×2
ILLUMINATOR WAVEGUIDE N/F (MISCELLANEOUS) IMPLANT
KIT BASIN OR (CUSTOM PROCEDURE TRAY) ×2 IMPLANT
KIT MARKER MARGIN INK (KITS) ×2 IMPLANT
MARKER SKIN DUAL TIP RULER LAB (MISCELLANEOUS) ×2 IMPLANT
NDL FILTER BLUNT 18X1 1/2 (NEEDLE) IMPLANT
NDL HYPO 25X1 1.5 SAFETY (NEEDLE) ×1 IMPLANT
NDL SAFETY ECLIPSE 18X1.5 (NEEDLE) IMPLANT
NEEDLE FILTER BLUNT 18X 1/2SAF (NEEDLE)
NEEDLE FILTER BLUNT 18X1 1/2 (NEEDLE) IMPLANT
NEEDLE HYPO 18GX1.5 SHARP (NEEDLE)
NEEDLE HYPO 25X1 1.5 SAFETY (NEEDLE) ×2 IMPLANT
NS IRRIG 1000ML POUR BTL (IV SOLUTION) ×2 IMPLANT
PACK SURGICAL SETUP 50X90 (CUSTOM PROCEDURE TRAY) ×2 IMPLANT
PENCIL BUTTON HOLSTER BLD 10FT (ELECTRODE) ×2 IMPLANT
SPONGE LAP 18X18 X RAY DECT (DISPOSABLE) ×2 IMPLANT
STRIP CLOSURE SKIN 1/2X4 (GAUZE/BANDAGES/DRESSINGS) ×2 IMPLANT
SUT MNCRL AB 4-0 PS2 18 (SUTURE) ×4 IMPLANT
SUT SILK 2 0 SH (SUTURE) ×2 IMPLANT
SUT VIC AB 2-0 SH 27 (SUTURE) ×4
SUT VIC AB 2-0 SH 27XBRD (SUTURE) ×2 IMPLANT
SUT VIC AB 3-0 SH 27 (SUTURE) ×4
SUT VIC AB 3-0 SH 27X BRD (SUTURE) ×2 IMPLANT
SYR BULB 3OZ (MISCELLANEOUS) ×2 IMPLANT
SYR CONTROL 10ML LL (SYRINGE) ×2 IMPLANT
TOWEL OR 17X24 6PK STRL BLUE (TOWEL DISPOSABLE) ×1 IMPLANT
TOWEL OR 17X26 10 PK STRL BLUE (TOWEL DISPOSABLE) ×2 IMPLANT
TUBE CONNECTING 12X1/4 (SUCTIONS) ×2 IMPLANT
YANKAUER SUCT BULB TIP NO VENT (SUCTIONS) ×2 IMPLANT

## 2016-05-28 NOTE — Anesthesia Procedure Notes (Addendum)
Anesthesia Regional Block: Pectoralis block   Pre-Anesthetic Checklist: ,, timeout performed, Correct Patient, Correct Site, Correct Laterality, Correct Procedure, Correct Position, site marked, Risks and benefits discussed,  Surgical consent,  Pre-op evaluation,  At surgeon's request and post-op pain management  Laterality: Right  Prep: chloraprep       Needles:  Injection technique: Single-shot  Needle Type: Echogenic Needle          Additional Needles:   Procedures: ultrasound guided,,,,,,,,  Narrative:  Start time: 05/28/2016 7:22 AM End time: 05/28/2016 7:27 AM Injection made incrementally with aspirations every 5 mL.  Performed by: Personally   Additional Notes: H+P and labs reviewed, risks and benefits discussed with patient, procedure tolerated well without complications

## 2016-05-28 NOTE — Anesthesia Preprocedure Evaluation (Addendum)
Anesthesia Evaluation  Patient identified by MRN, date of birth, ID band Patient awake    Reviewed: Allergy & Precautions, NPO status , Patient's Chart, lab work & pertinent test results  History of Anesthesia Complications Negative for: history of anesthetic complications  Airway Mallampati: III  TM Distance: <3 FB Neck ROM: Full  Mouth opening: Limited Mouth Opening  Dental  (+) Dental Advisory Given, Teeth Intact   Pulmonary neg pulmonary ROS,    breath sounds clear to auscultation       Cardiovascular negative cardio ROS   Rhythm:Regular     Neuro/Psych PSYCHIATRIC DISORDERS Depression  Neuromuscular disease    GI/Hepatic Neg liver ROS, GERD  Medicated and Controlled,  Endo/Other  Morbid obesity  Renal/GU negative Renal ROS     Musculoskeletal   Abdominal   Peds  Hematology   Anesthesia Other Findings   Reproductive/Obstetrics                          Anesthesia Physical Anesthesia Plan  ASA: II  Anesthesia Plan: General and Regional   Post-op Pain Management: GA combined w/ Regional for post-op pain   Induction: Intravenous  Airway Management Planned: LMA  Additional Equipment: None  Intra-op Plan:   Post-operative Plan: Extubation in OR  Informed Consent: I have reviewed the patients History and Physical, chart, labs and discussed the procedure including the risks, benefits and alternatives for the proposed anesthesia with the patient or authorized representative who has indicated his/her understanding and acceptance.   Dental advisory given  Plan Discussed with: CRNA, Anesthesiologist and Surgeon  Anesthesia Plan Comments:       Anesthesia Quick Evaluation

## 2016-05-28 NOTE — Interval H&P Note (Signed)
History and Physical Interval Note:  05/28/2016 7:28 AM  Candace [name]  has presented today for surgery, with the diagnosis of RIGHT BREAST CANCER  The various methods of treatment have been discussed with the patient and family. After consideration of risks, benefits and other options for treatment, the patient has consented to  Procedure(s): BREAST LUMPECTOMY WITH RADIOACTIVE SEED AND SENTINEL LYMPH NODE BIOPSY (Right) as a surgical intervention .  The patient's history has been reviewed, patient examined, no change in status, stable for surgery.  I have reviewed the patient's chart and labs.  Questions were answered to the patient's satisfaction.     [name]

## 2016-05-28 NOTE — Discharge Instructions (Signed)
[facility] °Office Phone Number [phone] °POST OP INSTRUCTIONS ° °Always review your discharge instruction sheet given to you by the facility where your surgery was performed. ° °IF YOU HAVE DISABILITY OR FAMILY LEAVE FORMS, YOU MUST BRING THEM TO THE OFFICE FOR PROCESSING.  DO NOT GIVE THEM TO YOUR DOCTOR. ° °1. A prescription for pain medication may be given to you upon discharge.  Take your pain medication as prescribed, if needed.  If narcotic pain medicine is not needed, then you may take acetaminophen (Tylenol), naprosyn (Alleve) or ibuprofen (Advil) as needed. °2. Take your usually prescribed medications unless otherwise directed °3. If you need a refill on your pain medication, please contact your pharmacy.  They will contact our office to request authorization.  Prescriptions will not be filled after 5pm or on week-ends. °4. You should eat very light the first 24 hours after surgery, such as soup, crackers, pudding, etc.  Resume your normal diet the day after surgery. °5. Most patients will experience some swelling and bruising in the breast.  Ice packs and a good support bra will help.  Wear the breast binder provided or a sports bra for 72 hours day and night.  After that wear a sports bra during the day until you return to the office. Swelling and bruising can take several days to resolve.  °6. It is common to experience some constipation if taking pain medication after surgery.  Increasing fluid intake and taking a stool softener will usually help or prevent this problem from occurring.  A mild laxative (Milk of Magnesia or Miralax) should be taken according to package directions if there are no bowel movements after 48 hours. °7. Unless discharge instructions indicate otherwise, you may remove your bandages 48 hours after surgery and you may shower at that time.  You may have steri-strips (small skin tapes) in place directly over the incision.  These strips should be left on the  skin for 7-10 days and will come off on their own.  If your surgeon used skin glue on the incision, you may shower in 24 hours.  The glue will flake off over the next 2-3 weeks.  Any sutures or staples will be removed at the office during your follow-up visit. °8. ACTIVITIES:  You may resume regular daily activities (gradually increasing) beginning the next day.  Wearing a good support bra or sports bra minimizes pain and swelling.  You may have sexual intercourse when it is comfortable. °a. You may drive when you no longer are taking prescription pain medication, you can comfortably wear a seatbelt, and you can safely maneuver your car and apply brakes. °b. RETURN TO WORK:  ______________________________________________________________________________________ °9. You should see your doctor in the office for a follow-up appointment approximately two weeks after your surgery.  Your doctor’s nurse will typically make your follow-up appointment when she calls you with your pathology report.  Expect your pathology report 3-4 business days after your surgery.  You may call to check if you do not hear from us after three days. °10. OTHER INSTRUCTIONS: _______________________________________________________________________________________________ _____________________________________________________________________________________________________________________________________ °_____________________________________________________________________________________________________________________________________ °_____________________________________________________________________________________________________________________________________ ° °WHEN TO CALL DR Lafern [name]: °1. Fever over 101.0 °2. Nausea and/or vomiting. °3. Extreme swelling or bruising. °4. Continued bleeding from incision. °5. Increased pain, redness, or drainage from the incision. ° °The clinic staff is available to answer your questions during regular  business hours.  Please don’t hesitate to call and ask to speak to one of the nurses for clinical concerns.  If you   have a medical emergency, go to the nearest emergency room or call 911.  A surgeon from [facility] is always on call at the hospital. ° °For further questions, please visit [email or web address] ° °

## 2016-05-28 NOTE — Op Note (Signed)
Preoperative diagnosis: Right breast cancer, clinical stage I Postoperative diagnosis: same as above Procedure: 1. Right breast seed guided lumpectomy 2. Right axillary sentinel nodebiopsy Surgeon Dr Serita Grammes Anes general with pectoral block EBL: minimal Comps none Specimen:  1. Rightbreast marked with paint 2. Righyaxillary sentinel nodes with count of 227 highest 3. Additional medial, inferior, superior and anterior marginsmarked short superior, long lateral, double deep Sponge count correct at completion Dispoto recovery stable  Indications: This is a 57 yof with negative genetic testing who has newly diagnosed right breast cancer that appears to be clinical stage I. We discussed options and have elected to proceed with right lumpectomy and sn biopsy. She had seed placed in breast lesion prior to beginning and I had these mammograms in the OR  Procedure: After informed consent was obtained the patient was taken to the OR. She was injected with technetium in the standard periareolar fashion.  She was given anitibiotics. SCDs were in place. She was prepped and draped in the standard sterile surgical fashion. A timeout was performed. I then located the seed in upper outer quadrant of the right breast. This was near the sternum clinically. I infiltrated marcaine and made an axillary incision. I used the lighted retractors to tunnel to the lesion in an attempt to hide her scar. I then removed the seedwith an attempt to get a clear margin. This waspassed off the table after marking with paint. I did confirm removal of theclipand seed with mammography.I knew I was close at several margins which also showed on mm.  I did remove additional margins as above. The posterior margin is the muscle. I placed clips in the cavity. I obtained hemostasis. I performed the sentinel node biopsy via the same incision. I went through the axillary fascia. I identified the sentinel nodes with  the highest count as above. There was no background radioactivity. I obtained hemostasis. I then closed this with 2-0 vicryl, 3-0 vicryl and 4-0 monocryl. Glue and steristrips were applied. A breast binder was placed. She was extubated and transferred to recovery stable

## 2016-05-28 NOTE — H&P (Signed)
55 yof who presents in consultation from Dr Kendall Flack for newly diagnosed right breast cancer. she underwent screening mm with right breast mass. the left side was negative. her density is b. she has fh of breast cancer of a sister at age 55 who lives in [place]. no other prostate, ovarian or breast cancer. she has no real prior breast history. dx views confirmed a right uoq breast mass on mm measuring about 7 mm. on [name] there is a mass at 10 oclock 9 cm from nipple measuring 6x5x5 mm in size. this underwent US guided biopsy that shows invasive lobular carcinoma that is er/pr pos, low ki and her 2 negative. she has no nipple dc or mass   Other Problems Yehuda Mao, RMA; 04/17/2016 8:31 AM) Breast Cancer Depression Gastroesophageal Reflux Disease Hypercholesterolemia Lump In Breast  Past Surgical History Yehuda Mao, RMA; 04/17/2016 8:31 AM) Breast Biopsy Right. Gallbladder Surgery - Laparoscopic  Diagnostic Studies History Yehuda Mao, RMA; 04/17/2016 8:31 AM) Colonoscopy 1-5 years ago Mammogram within last year Pap Smear 1-5 years ago  Allergies Shirlean Mylar Gwynn, RMA; 04/17/2016 8:31 AM) No Known Drug Allergies10/18/2017  Medication History Shirlean Mylar Gwynn, RMA; 04/17/2016 8:33 AM) Wellbutrin SR (150MG  Tablet ER 12HR, Oral) Active. Vitamin D (1000UNIT Tablet, Oral) Active. ZyrTEC Allergy (10MG  Capsule, Oral) Active. NexIUM (40MG  Capsule DR, Oral) Active. Crestor (20MG  Tablet, Oral) Active. Medications Reconciled  Social History Yehuda Mao, RMA; 04/17/2016 8:31 AM) Caffeine use Carbonated beverages, Coffee, Tea. No alcohol use No drug use Tobacco use Never smoker.  Family History Yehuda Mao, RMA; 04/17/2016 8:31 AM) Alcohol Abuse Father, Mother. Breast Cancer Sister. Cerebrovascular Accident Mother. Depression Father, Mother. Heart Disease Mother. Heart disease in female family member before age 83 Respiratory Condition  Father.  Pregnancy / Birth History Yehuda Mao, RMA; 04/17/2016 8:31 AM) Age at menarche 49 years. Age of menopause [age] Contraceptive History Oral contraceptives. Gravida 3 Irregular periods Length (months) of breastfeeding 12-24 Maternal age 57-40 Para 2    Review of Systems Shirlean Mylar Gwynn RMA; 04/17/2016 8:31 AM) General Not Present- Appetite Loss, Chills, Fatigue, Fever, Night Sweats, Weight Gain and Weight Loss. Skin Not Present- Change in Wart/Mole, Dryness, Hives, Jaundice, New Lesions, Non-Healing Wounds, Rash and Ulcer. HEENT Present- Seasonal Allergies and Wears glasses/contact lenses. Not Present- Earache, Hearing Loss, Hoarseness, Nose Bleed, Oral Ulcers, Ringing in the Ears, Sinus Pain, Sore Throat, Visual Disturbances and Yellow Eyes. Respiratory Not Present- Bloody sputum, Chronic Cough, Difficulty Breathing, Snoring and Wheezing. Breast Present- Breast Mass. Not Present- Breast Pain, Nipple Discharge and Skin Changes. Cardiovascular Not Present- Chest Pain, Difficulty Breathing Lying Down, Leg Cramps, Palpitations, Rapid Heart Rate, Shortness of Breath and Swelling of Extremities. Gastrointestinal Present- Indigestion. Not Present- Abdominal Pain, Bloating, Bloody Stool, Change in Bowel Habits, Chronic diarrhea, Constipation, Difficulty Swallowing, Excessive gas, Gets full quickly at meals, Hemorrhoids, Nausea, Rectal Pain and Vomiting. Female Genitourinary Not Present- Frequency, Nocturia, Painful Urination, Pelvic Pain and Urgency. Musculoskeletal Not Present- Back Pain, Joint Pain, Joint Stiffness, Muscle Pain, Muscle Weakness and Swelling of Extremities. Neurological Not Present- Decreased Memory, Fainting, Headaches, Numbness, Seizures, Tingling, Tremor, Trouble walking and Weakness. Psychiatric Not Present- Anxiety, Bipolar, Change in Sleep Pattern, Depression, Fearful and Frequent crying. Endocrine Not Present- Cold Intolerance, Excessive Hunger, Hair Changes,  Heat Intolerance, Hot flashes and New Diabetes. Hematology Not Present- Blood Thinners, Easy Bruising, Excessive bleeding, Gland problems, HIV and Persistent Infections.  Vitals (Robin Gwynn RMA; 04/17/2016 8:33 AM) 04/17/2016 8:33 AM Weight: 239 lb Height: 67in Body Surface Area: 2.18  m Body Mass Index: 37.43 kg/m  Pulse: 81 (Regular)  BP: 140/80 (Sitting, Left Arm, Standard)       Physical Exam [name] MD; 04/18/2016 3:34 PM) General Mental Status-Alert. Orientation-Oriented X3.  Chest and Lung Exam Chest and lung exam reveals -on auscultation, normal breath sounds, no adventitious sounds and normal vocal resonance.  Breast Nipples-No Discharge. Breast Lump-No Palpable Breast Mass.  Cardiovascular Cardiovascular examination reveals -normal heart sounds, regular rate and rhythm with no murmurs.  Lymphatic Head & Neck  General Head & Neck Lymphatics: Bilateral - Description - Normal. Axillary  General Axillary Region: Bilateral - Description - Normal. Note: no Larue adenopathy   Assessment & Plan [name] MD; 04/18/2016 4:02 PM) BREAST CANCER OF UPPER-OUTER QUADRANT OF RIGHT FEMALE BREAST (C50.411) Right lump/sn biopsy MRI showed no other abnormalities We discussed the staging and pathophysiology of breast cancer. We discussed all of the different options for treatment for breast cancer including surgery, chemotherapy, radiation therapy, Herceptin, and antiestrogen therapy. We discussed a sentinel lymph node biopsy as she does not appear to having lymph node involvement right now. We discussed the performance of that with injection of radioactive tracer. We discussed that there is a chance of having a positive node with a sentinel lymph node biopsy and we will await the permanent pathology to make any other first further decisions in terms of her treatment. We discussed up to a 5% risk lifetime of chronic shoulder pain as well as  lymphedema associated with a sentinel lymph node biopsy. We discussed the options for treatment of the breast cancer which included lumpectomy versus a mastectomy. We discussed the performance of the lumpectomy with radioactive seed placement. We discussed a 5-10% chance of a positive margin requiring reexcision in the operating room. We also discussed that she will need radiation therapy if she undergoes lumpectomy. We discussed the mastectomy (removal of whole breast) and the postoperative care for that as well. Mastectomy can be followed by reconstruction. The decision for lumpectomy vs mastectomy has no impact on decision for chemotherapy. Most mastectomy patients will not need radiation therapy. We discussed that there is no difference in her survival whether she undergoes lumpectomy with radiation therapy or antiestrogen therapy versus a mastectomy. There is also no real difference between her recurrence in the breast. We discussed the risks of operation including bleeding, infection, possible reoperation. She understands her further therapy will be based on what her stages at the time of her operation.

## 2016-05-28 NOTE — Anesthesia Procedure Notes (Signed)
Procedure Name: LMA Insertion Date/Time: 05/28/2016 7:43 AM Performed by: Garrison Columbus T Pre-anesthesia Checklist: Patient identified, Emergency Drugs available, Suction available and Patient being monitored Patient Re-evaluated:Patient Re-evaluated prior to inductionOxygen Delivery Method: Circle System Utilized Preoxygenation: Pre-oxygenation with 100% oxygen Intubation Type: IV induction Ventilation: Mask ventilation without difficulty LMA: LMA inserted LMA Size: 4.0 Number of attempts: 1 Airway Equipment and Method: Bite block Placement Confirmation: positive ETCO2 and breath sounds checked- equal and bilateral Tube secured with: Tape Dental Injury: Teeth and Oropharynx as per pre-operative assessment

## 2016-05-28 NOTE — Transfer of Care (Signed)
Immediate Anesthesia Transfer of Care Note  Patient: Candace [name]  Procedure(s) Performed: Procedure(s): RADIOACTIVE SEED GUIDED RIGHT BREAST LUMPECTOMY WITH  RIGHT AXILLARY SENTINEL LYMPH NODE BIOPSY (Right)  Patient Location: PACU  Anesthesia Type:General and Regional  Level of Consciousness: awake, alert  and oriented  Airway & Oxygen Therapy: Patient Spontanous Breathing  Post-op Assessment: Report given to RN, Post -op Vital signs reviewed and stable and Patient moving all extremities X 4  Post vital signs: Reviewed and stable  Last Vitals:  Vitals:   05/28/16 0618  BP: (!) 148/82  Pulse: 89  Temp: 36.9 C    Last Pain: There were no vitals filed for this visit.    Patients Stated Pain Goal: 3 ([id number])  Complications: No apparent anesthesia complications

## 2016-05-29 ENCOUNTER — Encounter (HOSPITAL_COMMUNITY): Payer: Self-pay | Admitting: General Surgery

## 2016-05-29 NOTE — Anesthesia Postprocedure Evaluation (Signed)
Anesthesia Post Note  Patient: Candace [name]  Procedure(s) Performed: Procedure(s) (LRB): RADIOACTIVE SEED GUIDED RIGHT BREAST LUMPECTOMY WITH  RIGHT AXILLARY SENTINEL LYMPH NODE BIOPSY (Right)  Patient location during evaluation: PACU Anesthesia Type: General and Regional Level of consciousness: awake Pain management: pain level controlled Vital Signs Assessment: post-procedure vital signs reviewed and stable Respiratory status: spontaneous breathing Cardiovascular status: stable Postop Assessment: no signs of nausea or vomiting Anesthetic complications: no    Last Vitals:  Vitals:   05/28/16 1055 05/28/16 1100  BP: (!) 143/82 (!) 151/83  Pulse: 99 97  Resp: 19 20  Temp: 36.4 C     Last Pain:  Vitals:   05/28/16 1130  PainSc: Asleep                 Carolyn Sylvia

## 2016-05-31 ENCOUNTER — Ambulatory Visit: Payer: 59 | Admitting: Oncology

## 2016-06-04 ENCOUNTER — Telehealth: Payer: Self-pay | Admitting: *Deleted

## 2016-06-04 NOTE — Telephone Encounter (Signed)
Received order for Oncotype Testing. Requisition sent to pathology. Received by Varney Biles.

## 2016-06-11 MED FILL — ROSUVASTATIN CALCIUM 10 MG: 10 | 90 days supply | Qty: 90 | Fill #1

## 2016-06-13 ENCOUNTER — Telehealth: Payer: Self-pay | Admitting: *Deleted

## 2016-06-13 DIAGNOSIS — C50411 Malignant neoplasm of upper-outer quadrant of right female breast: Secondary | ICD-10-CM | POA: Diagnosis not present

## 2016-06-13 NOTE — Telephone Encounter (Signed)
Received oncotype results of 22.  Copy to medical records and copy to Dr. Jana Hakim.

## 2016-06-14 ENCOUNTER — Encounter (HOSPITAL_COMMUNITY): Payer: Self-pay

## 2016-06-17 ENCOUNTER — Ambulatory Visit (HOSPITAL_BASED_OUTPATIENT_CLINIC_OR_DEPARTMENT_OTHER): Payer: 59 | Admitting: Oncology

## 2016-06-17 VITALS — BP 144/76 | HR 78 | Temp 98.0°F | Resp 18 | Ht 67.0 in | Wt 244.2 lb

## 2016-06-17 DIAGNOSIS — C50411 Malignant neoplasm of upper-outer quadrant of right female breast: Secondary | ICD-10-CM | POA: Diagnosis not present

## 2016-06-17 DIAGNOSIS — Z170 Estrogen receptor positive status [ER+]: Secondary | ICD-10-CM | POA: Diagnosis not present

## 2016-06-17 NOTE — Progress Notes (Signed)
[facility]  Telephone:(336) [phone] Fax:(336) [phone]     ID: Candace [name] DOB: 04/28/1961  MR#: [id number]  TMH#:[id number]  Patient Care Team: [name], MD as PCP - General (Family Medicine) Chauncey Cruel, MD as Consulting Physician (Oncology) [name], MD as Consulting Physician (General Surgery) Eldred Manges, MD as Consulting Physician (Obstetrics and Gynecology) Ronald Lobo, MD as Consulting Physician (Gastroenterology) Danella Sensing, MD as Consulting Physician (Dermatology) Chauncey Cruel, MD OTHER MD:  CHIEF COMPLAINT: Estrogen receptor positive breast cancer   CURRENT TREATMENT: To discuss the Oncotype and genetics results   BREAST CANCER HISTORY: From the original intake note:  Candace [name] had routine screening mammography with tomography at the [facility] 03/29/2016. On 04/04/2016 she underwent right diagnostic mammography and tomography with right breast ultrasonography. The breast density was category B. In the upper outer quadrant of the breast there was a mass measuring 0.7 cm, with irregular margins. By ultrasonography this was located at the 10:00 position 9 cm from the nipple measuring 0.6 cm. Right axillary ultrasound was negative.  Biopsy of the right breast mass in question 04/05/2016 showed ([id number]) and invasive lobular carcinoma, grade 2 or 3, E-cadherin negative, estrogen receptor 90% positive, progesterone receptor 5% positive, both with strong staining intensity, with an MIB-1 of 20%, and no HER-2 amplification, the signals ratio being 0.82 and the number per cell 1.60.  Bilateral breast MRI was obtained 04/23/2016. This confirmed a 0.6 cm nodule in the upper-outer quadrant of the right breast with no additional sites of concern in either breast and no abnormal appearing lymph nodes.  Her subsequent history is as detailed below.  INTERVAL HISTORY: Candace [name] returns today for follow-up of her estrogen receptor  positive breast cancer. Since her last visit here she underwent right lumpectomy with sentinel lymph node sampling, on 05/28/2016. The final pathology from this procedure ([id number]) showed a 1.0 cm invasive lobular carcinoma, grade 2, with ample margins. A total of 4 sentinel lymph nodes were removed, all clear.  An Oncotype DX was obtained from this material. Cor of 22 predicts a risk of outside the breast recurrence within the next 10 years of 14% if the patient's only systemic therapy is tamoxifen for 5 years. It predicts a benefit from adding chemotherapy in the 4% range.  Since her last visit here the patient also underwent genetics testing which showed no deleterious mutations.  REVIEW OF SYSTEMS: Candace [name] did well with her surgery. She says she was "out of it" for about 2 days after because of anesthesia. She still has some pain in the axilla and she is taking Aleve for that. She has been scheduled for physical therapy but has not started exercises yet. Aside from these issues she has a little bit of heartburn. She is concerned about her weight. A detailed review of systems today was otherwise stable   PAST MEDICAL HISTORY: Past Medical History:  Diagnosis Date  . Abnormal Pap smear   . Anovulation   . Cancer [facility]) 2017   right breast cancer  . Complication of anesthesia    slow to wake up  . Depression   . Endometrial hyperplasia   . Endometrial polyp    h/o  . Epidermal cyst    h/o  . GERD (gastroesophageal reflux disease)   . H/O hemorrhoids   . H/O menorrhagia   . High risk HPV infection   . Insomnia   . Oligomenorrhea   . Restless legs syndrome   . Seasonal  allergies     PAST SURGICAL HISTORY: Past Surgical History:  Procedure Laterality Date  . BREAST LUMPECTOMY WITH RADIOACTIVE SEED AND SENTINEL LYMPH NODE BIOPSY Right 05/28/2016   Procedure: RADIOACTIVE SEED GUIDED RIGHT BREAST LUMPECTOMY WITH  RIGHT AXILLARY SENTINEL LYMPH NODE BIOPSY;  Surgeon: [name], MD;  Location: [place];  Service: General;  Laterality: Right;  . CARPAL TUNNEL RELEASE  03/26/2011   right hand  . CARPAL TUNNEL RELEASE  05/28/2011   Procedure: CARPAL TUNNEL RELEASE;  Surgeon: [name];  Location: [facility];  Service: Orthopedics;  Laterality: Left;  . CHOLECYSTECTOMY N/A 01/24/2013   Procedure: LAPAROSCOPIC CHOLECYSTECTOMY WITH INTRAOPERATIVE CHOLANGIOGRAM;  Surgeon: [name], MD;  Location: [place];  Service: General;  Laterality: N/A;  . COLONOSCOPY    . HYSTEROSCOPY    . POLYPECTOMY    . TONSILLECTOMY  1967   as child  . trigger fingers  8 yrs. ago   both done months apart  . uterine ablation  06/04/2010    FAMILY HISTORY Family History  Problem Relation Age of Onset  . Heart disease Mother   . Hypertension Mother   . Stroke Mother   . Dementia Mother     vascular dementia  . Pulmonary fibrosis Father     d. 40  . Depression Father   . Heart attack Maternal Grandmother     d. [phone]  . Heart disease Maternal Grandmother   . Heart disease Maternal Grandfather     d. 60-63  . Leukemia Paternal Grandmother     d. late 60s  . Breast cancer Sister 53    IDC s/p mastectomy and chemo  Both of the patient's parents were only children. Her father died at age 59 from idiopathic pulmonary fibrosis. The patient's mother died at the age of 54 in the setting of dementia. The patient has one brother and one sister. Her sister was diagnosed at age 39 with breast cancer (a T1b invasive ductal carcinoma just behind the nipple, requiring mastectomy. Her Oncotype was 24, and she received chemotherapy 3. She is now doing well).  GYNECOLOGIC HISTORY:  No LMP recorded. Patient has had an ablation. Menarche age 33, first live birth age ? She is GX P2. She stopped having periods December 2012 when she underwent endometrial ablation. 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 She is divorced and lives at home with her daughters                and [name] (age 10 and 14 as of November 2017) and 2 dogs. The patient's was brought up as an [employer].    ADVANCED DIRECTIVES: In place. The patient's sister Halford Decamp is her healthcare part of attorney   HEALTH MAINTENANCE: Social History  Substance Use Topics  . Smoking status: Never Smoker  . Smokeless tobacco: Never Used  . Alcohol use No     Comment: none since 1996     Colonoscopy:  PAP:  Bone density:   No Known Allergies  Current Outpatient Prescriptions  Medication Sig Dispense Refill  . buPROPion (WELLBUTRIN XL) 150 MG 24 hr tablet Take 150 mg by mouth daily.    . cetirizine (ZYRTEC) 10 MG tablet Take 10 mg by mouth daily. [facility]    . Cholecalciferol (VITAMIN D) 2000 units tablet Take 2,000 Units by mouth daily.    [name]  esomeprazole (NEXIUM) 40 MG capsule Take 40 mg by mouth every 36 (thirty-six) hours.    [name] FLUoxetine (PROZAC) 10 MG capsule Take 10 mg by mouth daily with lunch.     . ibuprofen (ADVIL,MOTRIN) 200 MG tablet Take 400-800 mg by mouth every 8 (eight) hours as needed (for pain.).    [name] Multiple Vitamin (MULTIVITAMIN WITH MINERALS) TABS tablet Take 1 tablet by mouth daily.    [name] oxyCODONE-acetaminophen (PERCOCET) 10-325 MG tablet Take 1 tablet by mouth every 6 (six) hours as needed for pain. 20 tablet 0  . polyvinyl alcohol (LIQUIFILM TEARS) 1.4 % ophthalmic solution Place 1 drop into both eyes 3 (three) times daily as needed for dry eyes.    [name] rOPINIRole (REQUIP) 0.25 MG tablet Take 0.25 mg by mouth daily at 8 pm.    . rosuvastatin (CRESTOR) 10 MG tablet Take 10 mg by mouth at bedtime.     No current facility-administered medications for this visit.     OBJECTIVE:Middle-aged white woman Who appears well Vitals:   06/17/16 0949  BP: (!) 144/76  Pulse: 78  Resp: 18  Temp: 98 F (36.7  C)     Body mass index is 38.25 kg/m.    ECOG FS:1 - Symptomatic but completely ambulatory  Sclerae unicteric, pupils round and equal Oropharynx clear and moist-- no thrush or other lesions No cervical or supraclavicular adenopathy Lungs no rales or rhonchi Heart regular rate and rhythm Abd soft, obese, nontender, positive bowel sounds MSK no focal spinal tenderness, no upper extremity lymphedema Neuro: nonfocal, well oriented, appropriate affect Breasts: The right breast is status post recent lumpectomy. The cosmetic result is excellent. There is no erythema, swelling, or dehiscence. The right axilla is benign. Left breast is unremarkable.  LAB RESULTS:  CMP     Component Value Date/Time   NA 140 05/02/2016 1538   K 3.9 05/02/2016 1538   CL 99 01/24/2013 0316   CO2 28 05/02/2016 1538   GLUCOSE 109 05/02/2016 1538   BUN 12.4 05/02/2016 1538   CREATININE 0.8 05/02/2016 1538   CALCIUM 9.7 05/02/2016 1538   PROT 7.0 05/02/2016 1538   ALBUMIN 3.7 05/02/2016 1538   AST 20 05/02/2016 1538   ALT 32 05/02/2016 1538   ALKPHOS 83 05/02/2016 1538   BILITOT 0.27 05/02/2016 1538   GFRNONAA 83 (L) 01/24/2013 0316   GFRAA >90 01/24/2013 0316    INo results found for: SPEP, UPEP  Lab Results  Component Value Date   WBC 7.3 05/22/2016   NEUTROABS 4.1 05/02/2016   HGB 14.2 05/22/2016   HCT 42.3 05/22/2016   MCV 85.8 05/22/2016   PLT 271 05/22/2016      Chemistry      Component Value Date/Time   NA 140 05/02/2016 1538   K 3.9 05/02/2016 1538   CL 99 01/24/2013 0316   CO2 28 05/02/2016 1538   BUN 12.4 05/02/2016 1538   CREATININE 0.8 05/02/2016 1538      Component Value Date/Time   CALCIUM 9.7 05/02/2016 1538   ALKPHOS 83 05/02/2016 1538   AST 20 05/02/2016 1538   ALT 32 05/02/2016 1538   BILITOT 0.27 05/02/2016 1538       No results found for: LABCA2  No components found for: LABCA125  No results for input(s): INR in the last 168 hours.  Urinalysis      Component Value Date/Time   COLORURINE YELLOW 01/24/2013 0327   APPEARANCEUR TURBID (A) 01/24/2013 0327   LABSPEC 1.021 01/24/2013  0327   PHURINE 7.5 01/24/2013 0327   GLUCOSEU NEGATIVE 01/24/2013 0327   HGBUR NEGATIVE 01/24/2013 0327   BILIRUBINUR NEGATIVE 01/24/2013 0327   KETONESUR 15 (A) 01/24/2013 0327   PROTEINUR NEGATIVE 01/24/2013 0327   UROBILINOGEN 0.2 01/24/2013 0327   NITRITE NEGATIVE 01/24/2013 0327   LEUKOCYTESUR NEGATIVE 01/24/2013 0327     STUDIES: Mm Breast Surgical Specimen  Result Date: 05/28/2016 CLINICAL DATA:  Post a right breast lumpectomy. EXAM: SPECIMEN RADIOGRAPH OF THE RIGHT BREAST COMPARISON:  Previous exam(s). FINDINGS: Status post excision of the right breast. The radioactive seed and biopsy marker clip are present, completely intact, and were marked for pathology. There are wide radiographic margins. IMPRESSION: Specimen radiograph of the right breast. Electronically Signed   By: Fidela Salisbury M.D.   On: 05/28/2016 08:18   Mm Rt Radioactive Seed Loc Mammo Guide  Result Date: 05/27/2016 CLINICAL DATA:  Patient for preoperative localization prior to right lumpectomy. EXAM: MAMMOGRAPHIC GUIDED RADIOACTIVE SEED LOCALIZATION OF THE RIGHT BREAST COMPARISON:  Previous exam(s). FINDINGS: Patient presents for radioactive seed localization prior to right breast lumpectomy. I met with the patient and we discussed the procedure of seed localization including benefits and alternatives. We discussed the high likelihood of a successful procedure. We discussed the risks of the procedure including infection, bleeding, tissue injury and further surgery. We discussed the low dose of radioactivity involved in the procedure. Informed, written consent was given. The usual time-out protocol was performed immediately prior to the procedure. Using mammographic guidance, sterile technique, 1% lidocaine and an I-125 radioactive seed, clip and mass within the lateral right breast  were localized using a lateral approach. The follow-up mammogram images confirm the seed in the expected location and were marked for Dr. Donne Hazel. Follow-up survey of the patient confirms presence of the radioactive seed. Order number of I-125 seed:  [id number]. Total activity:  2.774 millicuries  Reference Date: 05/03/2016 The patient tolerated the procedure well and was released from the [facility]. She was given instructions regarding seed removal. IMPRESSION: Radioactive seed localization right breast. No apparent complications. Electronically Signed   By: Lovey Newcomer M.D.   On: 05/27/2016 15:51    ELIGIBLE FOR AVAILABLE RESEARCH PROTOCOL: no  ASSESSMENT: 55 y.o. BRCA negative [place] woman status post right breast upper outer quadrant biopsy 04/05/2016 for a clinical T1b N0, stage IA invasive lobular breast cancer, grade 2 or 3, estrogen and progesterone receptor positive, HER-2 not amplified, with an MIB-1 of 20%  (1) genetics testing 04/23/2016 through the Agency offered by [facility] Hope Pigeon, MD)  found no deleterious mutations in  ATM, BARD1, BRCA1, BRCA2, BRIP1, CDH1, CHEK2, FANCC, MLH1, MSH2, MSH6, NBN, PALB2, PMS2, PTEN, RAD51C, RAD51D, TP53, and XRCC2.  This panel also includes deletion/duplication analysis (without sequencing) for one gene, EPCAM  (2) status post right lumpectomy and sentinel lymph node sampling 05/28/2016 for a pT1b pN0, stage IA invasive lobular breast cancer, grade 2, with negative margins  (3) Oncotype Dx score of 22 predicts a 10 year risk of recurrence outside the breast of 14% if the patient's only systemic therapy is tamoxifen for 5 years. It predicts a benefit from chemotherapy in the 4% range.   (4) adjuvant radiation to follow  (5) to start anastrozole at the completion of local treatment.  PLAN: I spent approximately 45 minutes today with Candace [name] going over her situation. Her tumor was small, margins were  negative, and all the lymph nodes were negative. This is all very favorable.  Nevertheless she still needs radiation to decrease the risk of local recurrence. We discussed that at length. She will discuss it further with her radiation oncologist in the next week or so  We then discussed the Oncotype report in detail. This has is that if she takes tamoxifen for 5 years she will still have a 14% risk of this breast cancer showing up in her liver lungs or bones or other parts of her body outside the breast. For many patients it is difficult to understand that radiation has no bearing on this number, since radiation is a local treatment. We are talking about systemic recurrence. Whether or not she receives radiation, or for that matter whether or not she chose bilateral mastectomies, she would still have the same risk of outside the breast recurrence within 10 years  We then discussed the fact that if she add chemotherapy to tamoxifen the risk would drop by 4%. This means that if I give 100 women like her chemotherapy, 4 of them will get a very large benefit: They will not get metastatic breast cancer recurrence. However the other 74 women would get no benefit at all, only side effects.  This discussion was also complicated by the fact that the patient's sister had a very similar Oncotype result, namely score of 23. She was treated with what sounds like cyclophosphamide and docetaxel 4. That is an acceptable regimen in patients who have heart disease, but the standard of care is cyclophosphamide and doxorubicin 4 followed by weekly paclitaxel 12 and that is what I proposed.  I did say that if for some reason the shorter treatment was preferable to her and she was willing to accept the fact that it has not been compared directly with the current standard of care, I would be willing to proceed in that direction.  Clearly this is a very complicated issue because the benefit is so small in terms of number of  people benefited but there wants to do benefit get a very large benefit. After all this discussion Candace [name] is leaning strongly against chemotherapy. She understands if she changes her mind that is not a problem and I will be glad to discuss this further with her.  At this point however she is proceeding to radiation treatments. She is going to see me again late February. She should be getting done with radiation by that time. The plan will be to start Arimidex which she knows well shaved off an additional 2% from her risk of recurrence. We briefly discussed the possible toxicities, side effects and complications of anti-estrogens but we will go into that in more detail at your next visit.   Kathryne Gin, MD   06/17/2016 10:42 AM Medical Oncology and Hematology [facility] [address] [place], [place] [address] Tel. [phone]    Fax. [phone]

## 2016-06-19 ENCOUNTER — Encounter: Payer: Self-pay | Admitting: Radiation Oncology

## 2016-06-25 ENCOUNTER — Ambulatory Visit: Payer: 59 | Admitting: Physical Therapy

## 2016-07-08 NOTE — Progress Notes (Signed)
Location of Breast Cancer:Upper Outer Quadrant Right Breast  Histology per Pathology Report: Diagnosis 04/05/16 : Breast, right, needle core biopsy, 10 o'clock - INVASIVE MAMMARY CARCINOMA, SEE COMMENT.- MAMMARY CARCINOMA IN SITU.  Receptor Status: ER(90%+), PR (5%+), Her2-neu- neg. Ratio 1.60 , Ki-67(20%)  Did patient present with symptoms (if so, please note symptoms) or was this found on screening mammography?: Routine screening   Past/Anticipated interventions by surgeon, if HYQ:MVHQIONGE : 05/28/2016: Dr. [name], MD 1. Breast, lumpectomy, Right - INVASIVE LOBULAR CARCINOMA, GRADE II/III, SPANNING 1.0 CM. - THE SURGICAL RESECTION MARGINS ARE NEGATIVE FOR INVASIVE CARCINOMA. - SEE ONCOLOGY TABLE BELOW. 2. Lymph node, sentinel, biopsy, Right Axillary - THERE IS NO EVIDENCE OF CARCINOMA IN 1 OF 1 LYMPH NODE (0/1). - SEE COMMENT. 3. Lymph node, sentinel, biopsy, Right Axillary - THERE IS NO EVIDENCE OF CARCINOMA IN 1 OF 1 LYMPH NODE (0/1). - SEE COMMENT. 4. Lymph node, sentinel, biopsy, Right Axillary - THERE IS NO EVIDENCE OF CARCINOMA IN 1 OF 1 LYMPH NODE (0/1). - SEE COMMENT. 5. Lymph node, sentinel, biopsy, Right Axillary - THERE IS NO EVIDENCE OF CARCINOMA IN 1 OF 1 LYMPH NODE (0/1). - SEE COMMENT. 6. Breast, excision, Right additional Medial Margin - BENIGN BREAST PARENCHYMA. - BENIGN SKELETAL MUSCLE. - THERE IS NO EVIDENCE OF MALIGNANCY. - SEE COMMENT. 7. Breast, excision, Right additional Superior Margin - BENIGN BREAST PARENCHYMA. - THERE IS NO EVIDENCE OF MALIGNANCY. 8. Breast, excision, Right additional Inferior Margin - BENIGN BREAST PARENCHYMA. - THERE IS NO EVIDENCE OF MALIGNANCY - SEE COMMENT. 9. Breast, excision, Right additional Anterior Margin  Past/Anticipated interventions by medical oncology, if any: Chemotherapy :Oncotype =22, Dr, Jana Hakim, to start anastrozole after radiation completed ,follow up in  Late February 2018  Lymphedema issues,  if any: No  Pain issues, if any:   Slight discomfort under right axilla at times  SAFETY ISSUES: NO  Prior radiation? NO  Pacemaker/ICD? NO  Possible current pregnancy? NO  Is the patient on methotrexate?  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,inpatient placement; divorced,  Depression, menarche age 11, G77P2,  36 miscarriage, 2 daughters, endometrial ablation 2012,HRT 63 years, Mother dementia,heart disease,HTN,Stroke, deceased, Father deceased Pulmonary fibrosis,depression,Paternal grandmother leukemia, late 37's, , sister breast cancer IDC dx age 57 mastectomy,chemotherapy  ,living,Heart diseas in Maternal grandmother and maternal grandfather    Rebecca Eaton, RN 07/08/2016,1:41 PM BP 123/89 (BP Location: Left Arm, Patient Position: Sitting, Cuff Size: Large)   Pulse (!) 105   Temp 98.3 F (36.8 C) (Oral)   Resp 20   Ht _0  (1.702 m)   Wt 244 lb 3.2 oz (110.8 kg)   BMI 38.25 kg/m   Wt Readings from Last 3 Encounters:  07/10/16 244 lb 3.2 oz (110.8 kg)  06/17/16 244 lb 3.2 oz (110.8 kg)  05/28/16 237 lb (107.5 kg)

## 2016-07-09 ENCOUNTER — Encounter: Payer: Self-pay | Admitting: Radiation Oncology

## 2016-07-09 ENCOUNTER — Ambulatory Visit: Payer: 59 | Attending: General Surgery | Admitting: Physical Therapy

## 2016-07-09 DIAGNOSIS — Z483 Aftercare following surgery for neoplasm: Secondary | ICD-10-CM | POA: Insufficient documentation

## 2016-07-09 NOTE — Patient Instructions (Signed)
First of all, check with your insurance company to see if provider is in network J  [facility]                                            [address]  [place], [place] [address] [phone]    Does not file for insurance--- call for appointment with Cathy  [facility]   (for wigs and compression sleeves and gloves)  [address] [place], [place] [address] [phone]  Will file some insurances --- call for appointment   [facility] (for mastectomy prosthetics and garments) [address] [place], [place] [address] [phone] Will file some insurances --- call for appointment  [facility]  [address]  [place], [place] [address] [phone] Lower extremity garments  [facility]          Carolyn Alexander, owner [phone](cellphone)   [phone](office phone)    [phone] (fax) Will do home visit to measure for custom day and nighttime garments, can get pumps Will file insurance  [facility] Sales rep:  Matt Lawson:  [phone] [email or web address] Biocompression pumps   [facility]  Sales rep: Robert Rollins:  [phone] [email or web address] Entre and Flexitouch pumps    Other Resources: National Lymphedema Network:  www.lymphnet.org www.[email or web address] for patient articles and purchase a self manual lymph drainage DVD [email or web address] has informative articles.  

## 2016-07-09 NOTE — Therapy (Signed)
[place], [place], [address] Phone: [phone]   Fax:  [phone]  Physical Therapy Evaluation  Patient Details  Name: Candace [name] MRN: [id number] Date of Birth: 1961-06-06 Referring Provider: Dr. Donne Hazel   Encounter Date: 07/09/2016      PT End of Session - 07/09/16 1127    Visit Number 1   Number of Visits 1   PT Start Time 1020   PT Stop Time 1105   PT Time Calculation (min) 45 min   Activity Tolerance Patient tolerated treatment well   Behavior During Therapy [facility] for tasks assessed/performed      Past Medical History:  Diagnosis Date  . Abnormal Pap smear   . Anovulation   . Cancer [facility]) 2017   right breast cancer  . Complication of anesthesia    slow to wake up  . Depression   . Endometrial hyperplasia   . Endometrial polyp    h/o  . Epidermal cyst    h/o  . GERD (gastroesophageal reflux disease)   . H/O hemorrhoids   . H/O menorrhagia   . High risk HPV infection   . Insomnia   . Oligomenorrhea   . Restless legs syndrome   . Seasonal allergies     Past Surgical History:  Procedure Laterality Date  . BREAST LUMPECTOMY WITH RADIOACTIVE SEED AND SENTINEL LYMPH NODE BIOPSY Right 05/28/2016   Procedure: RADIOACTIVE SEED GUIDED RIGHT BREAST LUMPECTOMY WITH  RIGHT AXILLARY SENTINEL LYMPH NODE BIOPSY;  Surgeon: [name], MD;  Location: [place];  Service: General;  Laterality: Right;  . CARPAL TUNNEL RELEASE  03/26/2011   right hand  . CARPAL TUNNEL RELEASE  05/28/2011   Procedure: CARPAL TUNNEL RELEASE;  Surgeon: [name];  [facility];  Service: Orthopedics;  Laterality: Left;  . CHOLECYSTECTOMY N/A 01/24/2013   Procedure: LAPAROSCOPIC CHOLECYSTECTOMY WITH INTRAOPERATIVE CHOLANGIOGRAM;  Surgeon: [name], MD;  Location: [place];  Service: General;  Laterality: N/A;  . COLONOSCOPY    . HYSTEROSCOPY    . POLYPECTOMY    . TONSILLECTOMY  1967    as child  . trigger fingers  8 yrs. ago   both done months apart  . uterine ablation  06/04/2010    There were no vitals filed for this visit.       Subjective Assessment - 07/09/16 1031    Subjective "Good"  pt has not been limted in actiivtiy .  Has some pain in breast but is non existent now.    Pertinent History Right breast cancer with lumpectomy and 4 sentinel nodes removed on 05/28/2016. Has not, now will have to have chemotherapy  but will have radiation starting soon.  Past history includes GERD and hypercholesterolemia    Patient Stated Goals Education to learn what I need to know about lymphedma.  preventative care    Currently in Pain? No/denies            [facility] PT Assessment - 07/09/16 0001      Assessment   Medical Diagnosis right breast cancer    Referring Provider Dr. Donne Hazel    Onset Date/Surgical Date 05/28/16   Hand Dominance Right     Precautions   Precautions None     Restrictions   Weight Bearing Restrictions No     Balance Screen   Has the patient fallen in the past 6 months Yes  possibly related to shoewear will not address this visit  How many times? 1   Has the patient had a decrease in activity level because of a fear of falling?  No   Is the patient reluctant to leave their home because of a fear of falling?  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      Cognition   Overall Cognitive Status Within Functional Limits for tasks assessed     Observation/Other Assessments   Observations Obese female with well healed incisions   Skin Integrity inctact    Quick DASH  11.36      Coordination   Gross Motor Movements are Fluid and Coordinated Yes   Fine Motor Movements are Fluid and Coordinated Yes     Posture/Postural Control   Posture/Postural Control No significant limitations     ROM / Strength   AROM / PROM / Strength AROM;Strength     AROM   Overall AROM  Within functional limits for tasks performed   Overall AROM Comments symmetrical scapular movment    Right Shoulder Flexion 160 Degrees   Right Shoulder ABduction 160 Degrees   Right Shoulder External Rotation 90 Degrees   Left Shoulder Flexion 160 Degrees   Left Shoulder ABduction 160 Degrees   Left Shoulder External Rotation 90 Degrees     Strength   Overall Strength Within functional limits for tasks performed     Palpation   Palpation comment no fullness appreciated around scar which is moveable in all directions            LYMPHEDEMA/ONCOLOGY QUESTIONNAIRE - 07/09/16 1050      Right Upper Extremity Lymphedema   10 cm Proximal to Olecranon Process 36.5 cm   Olecranon Process 29 cm   15 cm Proximal to Ulnar Styloid Process 29 cm   Just Proximal to Ulnar Styloid Process 17.2 cm   Across Hand at [employer] 21.5 cm   At [place] of 2nd Digit 7 cm     Left Upper Extremity Lymphedema   10 cm Proximal to Olecranon Process 36.5 cm   Olecranon Process 29 cm   15 cm Proximal to Ulnar Styloid Process 28 cm   Just Proximal to Ulnar Styloid Process 17.5 cm   Across Hand at [employer] 21 cm   At [place] of 2nd Digit 6.8 cm           Quick Dash - 07/09/16 0001    Open a tight or new jar Moderate difficulty   Do heavy household chores (wash walls, wash floors) Moderate difficulty   Carry a shopping bag or briefcase No difficulty   Wash your back Mild difficulty   Use a knife to cut food No difficulty   Recreational activities in which you take some force or impact through your arm, shoulder, or hand (golf, hammering, tennis) No difficulty   During the past week, to what extent  has your arm, shoulder or hand problem interfered with your normal social activities with family, friends, neighbors, or groups? Not at all   During the past week, to what extent has your arm, shoulder  or hand problem limited your work or other regular daily activities Not at all   Arm, shoulder, or hand pain. None   Tingling (pins and needles) in your arm, shoulder, or hand None   Difficulty Sleeping No difficulty   DASH Score 11.36 %             [facility] Adult PT Treatment/Exercise - 07/09/16 0001      Self-Care   Self-Care Other Self-Care Comments   Other Self-Care Comments  brief introduction to lymphedema risk reduction and issued flyers for ABC class, [employer], where to get compression sleeve and gauntlet.  Also encouraged pt to return to her exercise coach and start a walking program  Instruacted in how to start low and prgress weight slowly and pt acknowledged undestanding.                 PT Education - 07/09/16 1127    Education provided Yes   Education Details see self care section    Person(s) Educated Patient   Methods Explanation;Handout   Comprehension Verbalized understanding                [facility] Goals - 07/09/16 1134      CC Long Term Goal  #1   Title Patient will be able to verbalize understanding of the importance of attending the postoperative After Breast Cancer Class for further lymphedema risk reduction education and therapeutic exercise.   Time 1   Period Days   Status Achieved     CC Long Term Goal  #2   Title Pt will verbalize understanding of how and where she should get a compression sleeve    Time 1   Status Achieved     CC Long Term Goal  #3   Title Pt will verbalize an understanding of how she should gradually progress resistive exercise to decrease lymphedema risk    Time 1   Period Days   Status Achieved            Plan - 07/09/16 1127    Clinical Impression Statement Ms Sisko comes to PT for aftercare  for right lumpectomy with 4 nodes removed.  She has full ROM, no increases in circumferences and functional strength.  She wants to return to her previous exercise coach and program as she has had a weight gain and understands the she should increase her upper body strength to decrease lymphedema risk.  She understands to start low and increase weight resistance slowly.  She plans to attend ABC class for more indepth lymphedema risk reduction education.  She was educated about follow up exercise programs in the community and how to get a compression sleeve and gauntlet.  She does not need follow up skilled PT at this time. Becasuse of lack of comorbidities and stable, uncomplicated  condition this is a low complexity eval    Rehab Potential Excellent   Clinical Impairments Affecting Rehab Potential 4 nodes removed    PT Frequency One time visit   PT Treatment/Interventions ADLs/Self Care Home Management;Patient/family education   PT Next Visit Plan one time visit   Recommended Other Services ABC class   Consulted and Agree with Plan of Care Patient      Patient will benefit from skilled therapeutic intervention in order to improve the following deficits and impairments:  Decreased knowledge of use of DME, Decreased knowledge of precautions  Visit Diagnosis: Aftercare following surgery for neoplasm     Problem List Patient Active  Problem List   Diagnosis Date Noted  . Genetic testing 05/13/2016  . Malignant neoplasm of upper-outer quadrant of right breast in female, estrogen receptor positive ([place]) 05/02/2016  . Family history of breast cancer in sister 04/23/2016  . Depression   . Endometrial hyperplasia   . Oligomenorrhea   . Insomnia   . H/O hemorrhoids   . Epidermal cyst   . High risk HPV infection   . Carpal tunnel syndrome on left 05/28/2011   Donato Heinz. Owens Shark PT  Norwood Levo 07/09/2016, 11:38 AM  [place], [place], [address] Phone: [phone]   Fax:  [phone]  Name: Candace [name] MRN: [id number] Date of Birth: 06/25/1961

## 2016-07-10 ENCOUNTER — Encounter: Payer: Self-pay | Admitting: Radiation Oncology

## 2016-07-10 ENCOUNTER — Ambulatory Visit
Admission: RE | Admit: 2016-07-10 | Discharge: 2016-07-10 | Disposition: A | Discharge status: Home / Self Care | Payer: 59 | Source: Ambulatory Visit | Attending: Radiation Oncology | Admitting: Radiation Oncology

## 2016-07-10 DIAGNOSIS — Z170 Estrogen receptor positive status [ER+]: Principal | ICD-10-CM

## 2016-07-10 DIAGNOSIS — C50411 Malignant neoplasm of upper-outer quadrant of right female breast: Secondary | ICD-10-CM | POA: Insufficient documentation

## 2016-07-10 DIAGNOSIS — Z510 Encounter for antineoplastic radiation therapy: Secondary | ICD-10-CM | POA: Insufficient documentation

## 2016-07-10 DIAGNOSIS — Z9889 Other specified postprocedural states: Secondary | ICD-10-CM | POA: Diagnosis not present

## 2016-07-10 HISTORY — DX: Malignant neoplasm of unspecified site of unspecified female breast: C50.919

## 2016-07-10 NOTE — Progress Notes (Signed)
Please see the Nurse Progress Note in the MD Initial Consult Encounter for this patient. 

## 2016-07-10 NOTE — Progress Notes (Signed)
Radiation Oncology         [phone] ________________________________  Name: Candace [name] MRN: [id number]  Date: 07/10/2016  DOB: 1961/02/14  INITIAL OUTPATIENT CONSULT  CC: Vidal Schwalbe, MD  Magrinat, Virgie Dad, MD  Diagnosis:   Stage IA, pT1b pN0, ER/PR positive grade 2, invasive lobular breast cancer, of the right breast.  Narrative:  This is a 56 y.o.  woman who had routine screening mammography with tomography at the [facility] 03/29/2016. On 04/04/2016 she underwent right diagnostic mammography and tomography with right breast ultrasonography. The breast density was category B. In the upper outer quadrant of the breast there was a mass measuring 0.7 cm, with irregular margins. By ultrasonography this was located at the 10:00 position 9 cm from the nipple measuring 0.6 cm. Right axillary ultrasound was negative.  Biopsy of the right breast mass in question 04/05/2016 showed invasive lobular carcinoma, grade 2 or 3, E-cadherin negative, ER 90% positive, PR 5% positive. Bilateral breast MRI was obtained 04/23/2016. This confirmed a 0.6 cm nodule in the upper-outer quadrant of the right breast with no additional sites of concern in either breast and no abnormal appearing lymph nodes. She underwent right lumpectomy with sentinel node sampling on 05/28/16. Final pathology reveals invasive lobular carcinoma measuring 1 cm, and all margins were negative. The lymph nodes sampled were also negative.                 The patient has undergone genetic testing, which showed no deleterious mutations. Oncotype testing returned results of 22, though after much discussion, she is not planning to receive chemotherapy at this time. She is scheduled to follow up with Dr. Jana Hakim in late February, and comes today to discuss the role of adjuvant radiotherapy.  Past Medical History:  Past Medical History:  Diagnosis Date  . Abnormal Pap smear   . Anovulation   . Breast cancer ([place]) 04/05/2016   right breast  . Cancer ([place] ) 2017   right breast cancer  . Complication of anesthesia    slow to wake up  . Depression   . Endometrial hyperplasia   . Endometrial polyp    h/o  . Epidermal cyst    h/o  . GERD (gastroesophageal reflux disease)   . H/O hemorrhoids   . H/O menorrhagia   . High risk HPV infection   . Insomnia   . Oligomenorrhea   . Restless legs syndrome   . Seasonal allergies     Past Surgical History: Past Surgical History:  Procedure Laterality Date  . BREAST LUMPECTOMY WITH RADIOACTIVE SEED AND SENTINEL LYMPH NODE BIOPSY Right 05/28/2016   Procedure: RADIOACTIVE SEED GUIDED RIGHT BREAST LUMPECTOMY WITH  RIGHT AXILLARY SENTINEL LYMPH NODE BIOPSY;  Surgeon: [name], MD;  Location: [place];  Service: General;  Laterality: Right;  . CARPAL TUNNEL RELEASE  03/26/2011   right hand  . CARPAL TUNNEL RELEASE  05/28/2011   Procedure: CARPAL TUNNEL RELEASE;  Surgeon: [name];  [facility];  Service: Orthopedics;  Laterality: Left;  . CHOLECYSTECTOMY N/A 01/24/2013   Procedure: LAPAROSCOPIC CHOLECYSTECTOMY WITH INTRAOPERATIVE CHOLANGIOGRAM;  Surgeon: [name], MD;  Location: [place];  Service: General;  Laterality: N/A;  . COLONOSCOPY    . HYSTEROSCOPY    . POLYPECTOMY    . TONSILLECTOMY  1967   as child  . trigger fingers  8 yrs. ago   both done months apart  . uterine ablation  06/04/2010  . uterine ablation  Social History:  Social History   Social History  . Marital status: Divorced    Spouse name: N/A  . Number of children: N/A  . Years of education: N/A   Occupational History  . Not on file.   Social History Main Topics  . Smoking status: Never Smoker  . Smokeless tobacco: Never Used  . Alcohol use No     Comment: none since 1996  . Drug use: No  . Sexual activity: Not Currently    Birth control/ protection: None   Other Topics Concern  . Not on file   Social History Narrative  . No narrative on file      Family History: Family History  Problem Relation Age of Onset  . Heart disease Mother   . Hypertension Mother   . Stroke Mother   . Dementia Mother     vascular dementia  . Pulmonary fibrosis Father     d. 53  . Depression Father   . Heart attack Maternal Grandmother     d. [phone]  . Heart disease Maternal Grandmother   . Heart disease Maternal Grandfather     d. 60-63  . Leukemia Paternal Grandmother     d. late 56s  . Breast cancer Sister 70    IDC s/p mastectomy and chemo     ALLERGIES:  has No Known Allergies.  Meds: Current Outpatient Prescriptions  Medication Sig Dispense Refill  . buPROPion (WELLBUTRIN XL) 150 MG 24 hr tablet Take 150 mg by mouth daily.    . cetirizine (ZYRTEC) 10 MG tablet Take 10 mg by mouth daily. [facility]    . Cholecalciferol (VITAMIN D) 2000 units tablet Take 2,000 Units by mouth daily.    [name] esomeprazole (NEXIUM) 40 MG capsule Take 40 mg by mouth every 36 (thirty-six) hours.    [name] FLUoxetine (PROZAC) 10 MG capsule Take 10 mg by mouth daily with lunch.     . ibuprofen (ADVIL,MOTRIN) 200 MG tablet Take 400-800 mg by mouth every 8 (eight) hours as needed (for pain.).    [name] Multiple Vitamin (MULTIVITAMIN WITH MINERALS) TABS tablet Take 1 tablet by mouth daily.    . polyvinyl alcohol (LIQUIFILM TEARS) 1.4 % ophthalmic solution Place 1 drop into both eyes 3 (three) times daily as needed for dry eyes.    [name] rOPINIRole (REQUIP) 0.25 MG tablet Take 0.25 mg by mouth daily at 8 pm.    . rosuvastatin (CRESTOR) 10 MG tablet Take 10 mg by mouth at bedtime.     No current facility-administered medications for this encounter.     Physical Findings:  height is 5\' 7"  (1.702 m) and weight is 244 lb 3.2 oz (110.8 kg). Her oral temperature is 98.3 F (36.8 C). Her blood pressure is 123/89 and her pulse is 105 (abnormal). Her respiration is 20.  In general this is a well appearing caucasian female in no acute distress. She is alert and  oriented x4 and appropriate throughout the examination. HEENT reveals that the patient is normocephalic, atraumatic. EOMs are intact. PERRLA. Skin is intact without any evidence of gross lesions. Cardiovascular exam reveals a regular rate and rhythm, no clicks rubs or murmurs are auscultated. Chest is clear to auscultation bilaterally. Lymphatic assessment is performed and does not reveal any adenopathy in the cervical, supraclavicular, axillary, or inguinal chains. The right breast reveals postoperative incision high on the upper outer quadrant without cellulitic streaking, separation, or fullness. The left breast is negative for palpable mass, and  bilateral nipples are free of bleeding or discharge. Abdomen has active bowel sounds in all quadrants and is intact. The abdomen is soft, non tender, non distended. Lower extremities are negative for pretibial pitting edema, deep calf tenderness, cyanosis or clubbing.   Lab Findings: Lab Results  Component Value Date   WBC 7.3 05/22/2016   HGB 14.2 05/22/2016   HCT 42.3 05/22/2016   MCV 85.8 05/22/2016   PLT 271 05/22/2016     Radiographic Findings: No results found.  Impression/Plan: 1. Stage IA,,pT1b pN0, Grade 2, ER/PR positive,  invasive lobular carcinoma of the right breast. Today, we talked to the patient and family about the findings and work-up thus far.  We discussed the natural history of breast cancer and general treatment, highlighting the role of radiotherapy in the management.  We discussed the available radiation techniques, and focused on the details of logistics and delivery.  We reviewed the anticipated acute and late sequelae associated with radiation in this setting.  Dr. Lisbeth Renshaw discusses that she is an appropriate candidate for 4 weeks of adjuvant radiotherapy in 20 fractions. The patient would like to proceed with radiation. A consent form was discussed, signed, and placed in her chart.  She is scheduled for CT simulation tomorrow  morning.   The above documentation reflects my direct findings during this shared patient visit. Please see the separate note by Dr. Lisbeth Renshaw on this date for the remainder of the patient's plan of care.    Carola Rhine, PAC  This document serves as a record of services personally performed by Kyung Rudd, MD and Shona Simpson, PA. It was created on his behalf by Maryla Morrow, a trained medical scribe. The creation of this record is based on the scribe's personal observations and the provider's statements to them. This document has been checked and approved by the attending provider.

## 2016-07-11 ENCOUNTER — Ambulatory Visit
Admission: RE | Admit: 2016-07-11 | Discharge: 2016-07-11 | Disposition: A | Discharge status: Home / Self Care | Payer: 59 | Source: Ambulatory Visit | Attending: Radiation Oncology | Admitting: Radiation Oncology

## 2016-07-11 DIAGNOSIS — Z510 Encounter for antineoplastic radiation therapy: Secondary | ICD-10-CM | POA: Diagnosis not present

## 2016-07-11 DIAGNOSIS — C50411 Malignant neoplasm of upper-outer quadrant of right female breast: Secondary | ICD-10-CM

## 2016-07-11 DIAGNOSIS — Z170 Estrogen receptor positive status [ER+]: Secondary | ICD-10-CM | POA: Diagnosis not present

## 2016-07-12 NOTE — Progress Notes (Signed)
  Radiation Oncology         [phone] ________________________________  Name: JALEIYAH BERSTLER MRN: [id number]  Date: 07/11/2016  DOB: Nov 24, 1960  DIAGNOSIS:     ICD-9-CM ICD-10-CM   1. Malignant neoplasm of upper-outer quadrant of right breast in female, estrogen receptor positive ([place]) 174.4 C50.411    V86.0 Z17.0      SIMULATION AND TREATMENT PLANNING NOTE  The patient presented for simulation prior to beginning her course of radiation treatment for her diagnosis of right-sided breast cancer. The patient was placed in a supine position on a breast board. A customized vac-lock bag was constructed and this complex treatment device will be used on a daily basis during her treatment. In this fashion, a CT scan was obtained through the chest area and an isocenter was placed near the chest wall within the breast.  The patient will be planned to receive a course of radiation initially to a dose of 50.4 Gy. This will consist of a whole breast radiotherapy technique. To accomplish this, 2 customized blocks have been designed which will correspond to medial and lateral whole breast tangent fields. This treatment will be accomplished at 1.8 Gy per fraction. A forward planning technique will also be evaluated to determine if this approach improves the plan. It is anticipated that the patient will then receive a 10 Gy boost to the seroma cavity which has been contoured. This will be accomplished at 2 Gy per fraction.   This initial treatment will consist of a 3-D conformal technique. The seroma has been contoured as the primary target structure. Additionally, dose volume histograms of both this target as well as the lungs and heart will also be evaluated. Such an approach is necessary to ensure that the target area is adequately covered while the nearby critical  normal structures are adequately spared.  Plan:  The final anticipated total dose therefore will correspond to 60.4  Gy.    _______________________________   [name], MD, PhD

## 2016-07-12 NOTE — Progress Notes (Signed)
  Radiation Oncology         [phone] ________________________________  Name: Candace [name] MRN: [id number]  Date: 07/11/2016  DOB: March 28, 1961  Optical Surface Tracking Plan:  Since intensity modulated radiotherapy (IMRT) and 3D conformal radiation treatment methods are predicated on accurate and precise positioning for treatment, intrafraction motion monitoring is medically necessary to ensure accurate and safe treatment delivery.  The ability to quantify intrafraction motion without excessive ionizing radiation dose can only be performed with optical surface tracking. Accordingly, surface imaging offers the opportunity to obtain 3D measurements of patient position throughout IMRT and 3D treatments without excessive radiation exposure.  I am ordering optical surface tracking for this patient's upcoming course of radiotherapy. ________________________________  Kyung Rudd, MD 07/12/2016 7:54 AM    Reference:   Ursula Alert, J, et al. Surface imaging-based analysis of intrafraction motion for breast radiotherapy patients.Journal of [place], n. 6, nov. 2014. ISSN [id number].   Available at: <http://www.jacmp.org/index.php/jacmp/article/view/4957>.

## 2016-07-16 MED FILL — BUPROPION HCL XL 150 MG TAB: 150 | 90 days supply | Qty: 90 | Fill #1

## 2016-07-17 DIAGNOSIS — Z170 Estrogen receptor positive status [ER+]: Secondary | ICD-10-CM | POA: Diagnosis not present

## 2016-07-17 DIAGNOSIS — Z510 Encounter for antineoplastic radiation therapy: Secondary | ICD-10-CM | POA: Diagnosis not present

## 2016-07-17 DIAGNOSIS — C50411 Malignant neoplasm of upper-outer quadrant of right female breast: Secondary | ICD-10-CM | POA: Diagnosis not present

## 2016-07-19 ENCOUNTER — Ambulatory Visit
Admission: RE | Admit: 2016-07-19 | Discharge: 2016-07-19 | Disposition: A | Discharge status: Home / Self Care | Payer: 59 | Source: Ambulatory Visit | Attending: Radiation Oncology | Admitting: Radiation Oncology

## 2016-07-19 DIAGNOSIS — Z170 Estrogen receptor positive status [ER+]: Secondary | ICD-10-CM | POA: Diagnosis not present

## 2016-07-19 DIAGNOSIS — C50411 Malignant neoplasm of upper-outer quadrant of right female breast: Secondary | ICD-10-CM | POA: Diagnosis not present

## 2016-07-19 DIAGNOSIS — Z510 Encounter for antineoplastic radiation therapy: Secondary | ICD-10-CM | POA: Diagnosis not present

## 2016-07-22 ENCOUNTER — Ambulatory Visit
Admission: RE | Admit: 2016-07-22 | Discharge: 2016-07-22 | Disposition: A | Discharge status: Home / Self Care | Payer: 59 | Source: Ambulatory Visit | Attending: Radiation Oncology | Admitting: Radiation Oncology

## 2016-07-22 DIAGNOSIS — Z170 Estrogen receptor positive status [ER+]: Secondary | ICD-10-CM | POA: Diagnosis not present

## 2016-07-22 DIAGNOSIS — Z510 Encounter for antineoplastic radiation therapy: Secondary | ICD-10-CM | POA: Diagnosis not present

## 2016-07-22 DIAGNOSIS — C50411 Malignant neoplasm of upper-outer quadrant of right female breast: Secondary | ICD-10-CM | POA: Diagnosis not present

## 2016-07-23 ENCOUNTER — Ambulatory Visit
Admission: RE | Admit: 2016-07-23 | Discharge: 2016-07-23 | Disposition: A | Discharge status: Home / Self Care | Payer: 59 | Source: Ambulatory Visit | Attending: Radiation Oncology | Admitting: Radiation Oncology

## 2016-07-23 DIAGNOSIS — C50411 Malignant neoplasm of upper-outer quadrant of right female breast: Secondary | ICD-10-CM

## 2016-07-23 DIAGNOSIS — Z170 Estrogen receptor positive status [ER+]: Principal | ICD-10-CM

## 2016-07-23 DIAGNOSIS — Z510 Encounter for antineoplastic radiation therapy: Secondary | ICD-10-CM | POA: Diagnosis not present

## 2016-07-23 MED ORDER — ALRA NON-METALLIC DEODORANT (RAD-ONC)
1.0000 "application " | Freq: Once | TOPICAL | Status: AC
  Administered 2016-07-23: 1 via TOPICAL

## 2016-07-23 MED ORDER — RADIAPLEXRX EX GEL
Freq: Once | CUTANEOUS | Status: AC
  Administered 2016-07-23: 09:00:00 via TOPICAL

## 2016-07-23 NOTE — Progress Notes (Signed)
Pt education done,  Breast, business card, radiaplex gel, alra deodorant,radiation therapy and you book given, skin irritation, fatigue, pain, discussed ways to manage side effects, sharp pains,swelling tenderness in breat, use of dove soap unscented, electric razor if shaving, no under wire bra, luke warm showers, no rubbing, scratching treated area, increase protein and calories in diet, stay hydrated, ,verbal understanding, teach back given 9:34 AM

## 2016-07-24 ENCOUNTER — Ambulatory Visit
Admission: RE | Admit: 2016-07-24 | Discharge: 2016-07-24 | Disposition: A | Discharge status: Home / Self Care | Payer: 59 | Source: Ambulatory Visit | Attending: Radiation Oncology | Admitting: Radiation Oncology

## 2016-07-24 DIAGNOSIS — Z170 Estrogen receptor positive status [ER+]: Secondary | ICD-10-CM | POA: Diagnosis not present

## 2016-07-24 DIAGNOSIS — Z510 Encounter for antineoplastic radiation therapy: Secondary | ICD-10-CM | POA: Diagnosis not present

## 2016-07-24 DIAGNOSIS — C50411 Malignant neoplasm of upper-outer quadrant of right female breast: Secondary | ICD-10-CM | POA: Diagnosis not present

## 2016-07-25 ENCOUNTER — Ambulatory Visit
Admission: RE | Admit: 2016-07-25 | Discharge: 2016-07-25 | Disposition: A | Discharge status: Home / Self Care | Payer: 59 | Source: Ambulatory Visit | Attending: Radiation Oncology | Admitting: Radiation Oncology

## 2016-07-25 DIAGNOSIS — Z170 Estrogen receptor positive status [ER+]: Secondary | ICD-10-CM | POA: Diagnosis not present

## 2016-07-25 DIAGNOSIS — C50411 Malignant neoplasm of upper-outer quadrant of right female breast: Secondary | ICD-10-CM | POA: Diagnosis not present

## 2016-07-25 DIAGNOSIS — Z510 Encounter for antineoplastic radiation therapy: Secondary | ICD-10-CM | POA: Diagnosis not present

## 2016-07-26 ENCOUNTER — Ambulatory Visit
Admission: RE | Admit: 2016-07-26 | Discharge: 2016-07-26 | Disposition: A | Discharge status: Home / Self Care | Payer: 59 | Source: Ambulatory Visit | Attending: Radiation Oncology | Admitting: Radiation Oncology

## 2016-07-26 ENCOUNTER — Encounter: Payer: Self-pay | Admitting: Radiation Oncology

## 2016-07-26 VITALS — BP 124/69 | HR 79 | Temp 98.7°F | Resp 16 | Wt 243.8 lb

## 2016-07-26 DIAGNOSIS — C50411 Malignant neoplasm of upper-outer quadrant of right female breast: Secondary | ICD-10-CM

## 2016-07-26 DIAGNOSIS — Z170 Estrogen receptor positive status [ER+]: Secondary | ICD-10-CM | POA: Diagnosis not present

## 2016-07-26 DIAGNOSIS — Z510 Encounter for antineoplastic radiation therapy: Secondary | ICD-10-CM | POA: Diagnosis not present

## 2016-07-26 NOTE — Progress Notes (Signed)
Weekly rad txs  Breast,  5/33 completed, no skin changes,  Skin intact, using radiaplex bid,  Appetite good, drinking plenty water, mild fatigue  9:18 AM BP 124/69 (BP Location: Left Arm, Patient Position: Sitting, Cuff Size: Large)   Pulse 79   Temp 98.7 F (37.1 C) (Oral)   Resp 16   Wt 243 lb 12.8 oz (110.6 kg)   BMI 38.18 kg/m   Wt Readings from Last 3 Encounters:  07/26/16 243 lb 12.8 oz (110.6 kg)  07/10/16 244 lb 3.2 oz (110.8 kg)  06/17/16 244 lb 3.2 oz (110.8 kg)

## 2016-07-26 NOTE — Progress Notes (Signed)
   Department of Radiation Oncology  Phone:  [phone] Fax:        [phone]  Weekly Treatment Note    Name: KINSLI SCHWAGER Date: 07/26/2016 MRN: [id number] DOB: 1961-05-03   Diagnosis:     ICD-9-CM ICD-10-CM   1. Malignant neoplasm of upper-outer quadrant of right breast in female, estrogen receptor positive ([place]) 174.4 C50.411    V86.0 Z17.0      Current dose: 9 Gy  Current fraction: 5   MEDICATIONS: Current Outpatient Prescriptions  Medication Sig Dispense Refill  . buPROPion (WELLBUTRIN XL) 150 MG 24 hr tablet Take 150 mg by mouth daily.    . cetirizine (ZYRTEC) 10 MG tablet Take 10 mg by mouth daily. [facility]    . Cholecalciferol (VITAMIN D) 2000 units tablet Take 2,000 Units by mouth daily.    [name] esomeprazole (NEXIUM) 40 MG capsule Take 40 mg by mouth every 36 (thirty-six) hours.    [name] FLUoxetine (PROZAC) 10 MG capsule Take 10 mg by mouth daily with lunch.     . hyaluronate sodium (RADIAPLEXRX) GEL Apply 1 application topically 2 (two) times daily.    [name] ibuprofen (ADVIL,MOTRIN) 200 MG tablet Take 400-800 mg by mouth every 8 (eight) hours as needed (for pain.).    [name] Multiple Vitamin (MULTIVITAMIN WITH MINERALS) TABS tablet Take 1 tablet by mouth daily.    . polyvinyl alcohol (LIQUIFILM TEARS) 1.4 % ophthalmic solution Place 1 drop into both eyes 3 (three) times daily as needed for dry eyes.    [name] rOPINIRole (REQUIP) 0.25 MG tablet Take 0.25 mg by mouth daily at 8 pm.    . rosuvastatin (CRESTOR) 10 MG tablet Take 10 mg by mouth at bedtime.     No current facility-administered medications for this encounter.      ALLERGIES: Patient has no known allergies.   LABORATORY DATA:  Lab Results  Component Value Date   WBC 7.3 05/22/2016   HGB 14.2 05/22/2016   HCT 42.3 05/22/2016   MCV 85.8 05/22/2016   PLT 271 05/22/2016   Lab Results  Component Value Date   NA 140 05/02/2016   K 3.9 05/02/2016   CL 99 01/24/2013   CO2 28 05/02/2016    Lab Results  Component Value Date   ALT 32 05/02/2016   AST 20 05/02/2016   ALKPHOS 83 05/02/2016   BILITOT 0.27 05/02/2016     NARRATIVE: Diara E Verdine was seen today for weekly treatment management. The chart was checked and the patient's films were reviewed.  The patient is doing very well during her first week of treatment. No unexpected difficulties. No significant skin irritation. She is somewhat more aware of the right breast currently but no significant discomfort.  PHYSICAL EXAMINATION: weight is 243 lb 12.8 oz (110.6 kg). Her oral temperature is 98.7 F (37.1 C). Her blood pressure is 124/69 and her pulse is 79. Her respiration is 16.        ASSESSMENT: The patient is doing satisfactorily with treatment.  PLAN: We will continue with the patient's radiation treatment as planned.

## 2016-07-29 ENCOUNTER — Ambulatory Visit
Admission: RE | Admit: 2016-07-29 | Discharge: 2016-07-29 | Disposition: A | Discharge status: Home / Self Care | Payer: 59 | Source: Ambulatory Visit | Attending: Radiation Oncology | Admitting: Radiation Oncology

## 2016-07-29 DIAGNOSIS — Z170 Estrogen receptor positive status [ER+]: Secondary | ICD-10-CM | POA: Diagnosis not present

## 2016-07-29 DIAGNOSIS — C50411 Malignant neoplasm of upper-outer quadrant of right female breast: Secondary | ICD-10-CM | POA: Diagnosis not present

## 2016-07-29 DIAGNOSIS — Z510 Encounter for antineoplastic radiation therapy: Secondary | ICD-10-CM | POA: Diagnosis not present

## 2016-07-30 ENCOUNTER — Ambulatory Visit
Admission: RE | Admit: 2016-07-30 | Discharge: 2016-07-30 | Disposition: A | Discharge status: Home / Self Care | Payer: 59 | Source: Ambulatory Visit | Attending: Radiation Oncology | Admitting: Radiation Oncology

## 2016-07-30 DIAGNOSIS — Z510 Encounter for antineoplastic radiation therapy: Secondary | ICD-10-CM | POA: Diagnosis not present

## 2016-07-30 DIAGNOSIS — Z170 Estrogen receptor positive status [ER+]: Secondary | ICD-10-CM | POA: Diagnosis not present

## 2016-07-30 DIAGNOSIS — C50411 Malignant neoplasm of upper-outer quadrant of right female breast: Secondary | ICD-10-CM | POA: Diagnosis not present

## 2016-07-31 ENCOUNTER — Ambulatory Visit
Admission: RE | Admit: 2016-07-31 | Discharge: 2016-07-31 | Disposition: A | Discharge status: Home / Self Care | Payer: 59 | Source: Ambulatory Visit | Attending: Radiation Oncology | Admitting: Radiation Oncology

## 2016-07-31 DIAGNOSIS — Z170 Estrogen receptor positive status [ER+]: Secondary | ICD-10-CM | POA: Diagnosis not present

## 2016-07-31 DIAGNOSIS — C50411 Malignant neoplasm of upper-outer quadrant of right female breast: Secondary | ICD-10-CM | POA: Diagnosis not present

## 2016-07-31 DIAGNOSIS — Z510 Encounter for antineoplastic radiation therapy: Secondary | ICD-10-CM | POA: Diagnosis not present

## 2016-08-01 ENCOUNTER — Ambulatory Visit
Admission: RE | Admit: 2016-08-01 | Discharge: 2016-08-01 | Disposition: A | Discharge status: Home / Self Care | Payer: 59 | Source: Ambulatory Visit | Attending: Radiation Oncology | Admitting: Radiation Oncology

## 2016-08-01 DIAGNOSIS — C50411 Malignant neoplasm of upper-outer quadrant of right female breast: Secondary | ICD-10-CM | POA: Diagnosis not present

## 2016-08-01 DIAGNOSIS — Z170 Estrogen receptor positive status [ER+]: Secondary | ICD-10-CM | POA: Diagnosis not present

## 2016-08-01 DIAGNOSIS — Z510 Encounter for antineoplastic radiation therapy: Secondary | ICD-10-CM | POA: Diagnosis not present

## 2016-08-02 ENCOUNTER — Ambulatory Visit
Admission: RE | Admit: 2016-08-02 | Discharge: 2016-08-02 | Disposition: A | Discharge status: Home / Self Care | Payer: 59 | Source: Ambulatory Visit | Attending: Radiation Oncology | Admitting: Radiation Oncology

## 2016-08-02 ENCOUNTER — Encounter: Payer: Self-pay | Admitting: Radiation Oncology

## 2016-08-02 VITALS — BP 135/78 | HR 87 | Temp 98.4°F | Resp 16 | Wt 245.2 lb

## 2016-08-02 DIAGNOSIS — C50411 Malignant neoplasm of upper-outer quadrant of right female breast: Secondary | ICD-10-CM

## 2016-08-02 DIAGNOSIS — Z170 Estrogen receptor positive status [ER+]: Secondary | ICD-10-CM | POA: Diagnosis not present

## 2016-08-02 DIAGNOSIS — Z510 Encounter for antineoplastic radiation therapy: Secondary | ICD-10-CM | POA: Diagnosis not present

## 2016-08-02 NOTE — Progress Notes (Signed)
   Department of Radiation Oncology  Phone:  [phone] Fax:        [phone]  Weekly Treatment Note    Name: Candace [name] Date: 08/02/2016 MRN: [id number] DOB: [birth year]   Diagnosis:     ICD-9-CM ICD-10-CM   1. Malignant neoplasm of upper-outer quadrant of right breast in female, estrogen receptor positive ([employer]) 174.4 C50.411    V86.0 Z17.0      Current dose: 18 Gy  Current fraction: 10   MEDICATIONS: Current Outpatient Prescriptions  Medication Sig Dispense Refill  . buPROPion (WELLBUTRIN XL) 150 MG 24 hr tablet Take 150 mg by mouth daily.    . cetirizine (ZYRTEC) 10 MG tablet Take 10 mg by mouth daily. [facility]    . Cholecalciferol (VITAMIN D) 2000 units tablet Take 2,000 Units by mouth daily.    [name] esomeprazole (NEXIUM) 40 MG capsule Take 40 mg by mouth every 36 (thirty-six) hours.    [name] FLUoxetine (PROZAC) 10 MG capsule Take 10 mg by mouth daily with lunch.     . hyaluronate sodium (RADIAPLEXRX) GEL Apply 1 application topically 2 (two) times daily.    [name] ibuprofen (ADVIL,MOTRIN) 200 MG tablet Take 400-800 mg by mouth every 8 (eight) hours as needed (for pain.).    [name] Multiple Vitamin (MULTIVITAMIN WITH MINERALS) TABS tablet Take 1 tablet by mouth daily.    . polyvinyl alcohol (LIQUIFILM TEARS) 1.4 % ophthalmic solution Place 1 drop into both eyes 3 (three) times daily as needed for dry eyes.    [name] rOPINIRole (REQUIP) 0.25 MG tablet Take 0.25 mg by mouth daily at 8 pm.    . rosuvastatin (CRESTOR) 10 MG tablet Take 10 mg by mouth at bedtime.     No current facility-administered medications for this encounter.      ALLERGIES: Patient has no known allergies.   LABORATORY DATA:  Lab Results  Component Value Date   WBC 7.3 05/22/2016   HGB 14.2 05/22/2016   HCT 42.3 05/22/2016   MCV 85.8 05/22/2016   PLT 271 05/22/2016   Lab Results  Component Value Date   NA 140 05/02/2016   K 3.9 05/02/2016   CL 99 01/24/2013   CO2 28 05/02/2016    Lab Results  Component Value Date   ALT 32 05/02/2016   AST 20 05/02/2016   ALKPHOS 83 05/02/2016   BILITOT 0.27 05/02/2016     NARRATIVE: Candace [name] was seen today for weekly treatment management. The chart was checked and the patient's films were reviewed.  The patient states that she is doing very well. Minor skin changes. She continues to use skin cream daily.  PHYSICAL EXAMINATION: weight is 245 lb 3.2 oz (111.2 kg). Her oral temperature is 98.4 F (36.9 C). Her blood pressure is 135/78 and her pulse is 87. Her respiration is 16.        ASSESSMENT: The patient is doing satisfactorily with treatment.  PLAN: We will continue with the patient's radiation treatment as planned.

## 2016-08-02 NOTE — Progress Notes (Signed)
Weekly rad tx Right breast,10/33 completed,  mild pink color on breast, skin intact, radiaplex bid used, appetite good, no pain 9:10 AM BP 135/78 (BP Location: Left Arm, Patient Position: Sitting, Cuff Size: Large)   Pulse 87   Temp 98.4 F (36.9 C) (Oral)   Resp 16   Wt 245 lb 3.2 oz (111.2 kg)   BMI 38.40 kg/m   Wt Readings from Last 3 Encounters:  08/02/16 245 lb 3.2 oz (111.2 kg)  07/26/16 243 lb 12.8 oz (110.6 kg)  07/10/16 244 lb 3.2 oz (110.8 kg)

## 2016-08-05 ENCOUNTER — Ambulatory Visit
Admission: RE | Admit: 2016-08-05 | Discharge: 2016-08-05 | Disposition: A | Discharge status: Home / Self Care | Payer: 59 | Source: Ambulatory Visit | Attending: Radiation Oncology | Admitting: Radiation Oncology

## 2016-08-05 DIAGNOSIS — Z510 Encounter for antineoplastic radiation therapy: Secondary | ICD-10-CM | POA: Diagnosis not present

## 2016-08-05 DIAGNOSIS — C50411 Malignant neoplasm of upper-outer quadrant of right female breast: Secondary | ICD-10-CM | POA: Diagnosis not present

## 2016-08-05 DIAGNOSIS — Z170 Estrogen receptor positive status [ER+]: Secondary | ICD-10-CM | POA: Diagnosis not present

## 2016-08-06 ENCOUNTER — Ambulatory Visit
Admission: RE | Admit: 2016-08-06 | Discharge: 2016-08-06 | Disposition: A | Discharge status: Home / Self Care | Payer: 59 | Source: Ambulatory Visit | Attending: Radiation Oncology | Admitting: Radiation Oncology

## 2016-08-06 DIAGNOSIS — C50411 Malignant neoplasm of upper-outer quadrant of right female breast: Secondary | ICD-10-CM | POA: Diagnosis not present

## 2016-08-06 DIAGNOSIS — Z510 Encounter for antineoplastic radiation therapy: Secondary | ICD-10-CM | POA: Diagnosis not present

## 2016-08-06 DIAGNOSIS — Z170 Estrogen receptor positive status [ER+]: Secondary | ICD-10-CM | POA: Diagnosis not present

## 2016-08-07 ENCOUNTER — Ambulatory Visit
Admission: RE | Admit: 2016-08-07 | Discharge: 2016-08-07 | Disposition: A | Discharge status: Home / Self Care | Payer: 59 | Source: Ambulatory Visit | Attending: Radiation Oncology | Admitting: Radiation Oncology

## 2016-08-07 DIAGNOSIS — Z170 Estrogen receptor positive status [ER+]: Secondary | ICD-10-CM | POA: Diagnosis not present

## 2016-08-07 DIAGNOSIS — C50411 Malignant neoplasm of upper-outer quadrant of right female breast: Secondary | ICD-10-CM | POA: Diagnosis not present

## 2016-08-07 DIAGNOSIS — Z510 Encounter for antineoplastic radiation therapy: Secondary | ICD-10-CM | POA: Diagnosis not present

## 2016-08-08 ENCOUNTER — Ambulatory Visit
Admission: RE | Admit: 2016-08-08 | Discharge: 2016-08-08 | Disposition: A | Discharge status: Home / Self Care | Payer: 59 | Source: Ambulatory Visit | Attending: Radiation Oncology | Admitting: Radiation Oncology

## 2016-08-08 DIAGNOSIS — Z170 Estrogen receptor positive status [ER+]: Secondary | ICD-10-CM | POA: Diagnosis not present

## 2016-08-08 DIAGNOSIS — Z510 Encounter for antineoplastic radiation therapy: Secondary | ICD-10-CM | POA: Diagnosis not present

## 2016-08-08 DIAGNOSIS — C50411 Malignant neoplasm of upper-outer quadrant of right female breast: Secondary | ICD-10-CM | POA: Diagnosis not present

## 2016-08-09 ENCOUNTER — Encounter: Payer: Self-pay | Admitting: Radiation Oncology

## 2016-08-09 ENCOUNTER — Ambulatory Visit
Admission: RE | Admit: 2016-08-09 | Discharge: 2016-08-09 | Disposition: A | Discharge status: Home / Self Care | Payer: 59 | Source: Ambulatory Visit | Attending: Radiation Oncology | Admitting: Radiation Oncology

## 2016-08-09 VITALS — BP 137/85 | HR 81 | Temp 98.3°F | Resp 20 | Ht 67.0 in | Wt 244.8 lb

## 2016-08-09 DIAGNOSIS — Z170 Estrogen receptor positive status [ER+]: Principal | ICD-10-CM

## 2016-08-09 DIAGNOSIS — C50411 Malignant neoplasm of upper-outer quadrant of right female breast: Secondary | ICD-10-CM | POA: Diagnosis not present

## 2016-08-09 DIAGNOSIS — Z510 Encounter for antineoplastic radiation therapy: Secondary | ICD-10-CM | POA: Diagnosis not present

## 2016-08-09 NOTE — Progress Notes (Signed)
   Department of Radiation Oncology  Phone:  [phone] Fax:        [phone]  Weekly Treatment Note    Name: Candace [name] Date: 08/09/2016 MRN: [id number] DOB: [birth year]   Diagnosis:     ICD-9-CM ICD-10-CM   1. Malignant neoplasm of upper-outer quadrant of right breast in female, estrogen receptor positive ([place]) 174.4 C50.411    V86.0 Z17.0      Current dose: 27 Gy  Current fraction: 15   MEDICATIONS: Current Outpatient Prescriptions  Medication Sig Dispense Refill  . buPROPion (WELLBUTRIN XL) 150 MG 24 hr tablet Take 150 mg by mouth daily.    . cetirizine (ZYRTEC) 10 MG tablet Take 10 mg by mouth daily. [facility]    . Cholecalciferol (VITAMIN D) 2000 units tablet Take 2,000 Units by mouth daily.    [name] esomeprazole (NEXIUM) 40 MG capsule Take 40 mg by mouth every 36 (thirty-six) hours.    [name] FLUoxetine (PROZAC) 10 MG capsule Take 10 mg by mouth daily with lunch.     . ibuprofen (ADVIL,MOTRIN) 200 MG tablet Take 400-800 mg by mouth every 8 (eight) hours as needed (for pain.).    [name] polyvinyl alcohol (LIQUIFILM TEARS) 1.4 % ophthalmic solution Place 1 drop into both eyes 3 (three) times daily as needed for dry eyes.    [name] rOPINIRole (REQUIP) 0.25 MG tablet Take 0.25 mg by mouth daily at 8 pm.    . rosuvastatin (CRESTOR) 10 MG tablet Take 10 mg by mouth at bedtime.    . hyaluronate sodium (RADIAPLEXRX) GEL Apply 1 application topically 2 (two) times daily.    . Multiple Vitamin (MULTIVITAMIN WITH MINERALS) TABS tablet Take 1 tablet by mouth daily.     No current facility-administered medications for this encounter.      ALLERGIES: Patient has no known allergies.   LABORATORY DATA:  Lab Results  Component Value Date   WBC 7.3 05/22/2016   HGB 14.2 05/22/2016   HCT 42.3 05/22/2016   MCV 85.8 05/22/2016   PLT 271 05/22/2016   Lab Results  Component Value Date   NA 140 05/02/2016   K 3.9 05/02/2016   CL 99 01/24/2013   CO2 28 05/02/2016    Lab Results  Component Value Date   ALT 32 05/02/2016   AST 20 05/02/2016   ALKPHOS 83 05/02/2016   BILITOT 0.27 05/02/2016     NARRATIVE: Candace [name] was seen today for weekly treatment management. The chart was checked and the patient's films were reviewed.  The patient states that she is doing very well. No major changes in terms of skin irritation. No significant daily at this point.  PHYSICAL EXAMINATION: height is 5\' 7"  (1.702 m) and weight is 244 lb 12.8 oz (111 kg). Her oral temperature is 98.3 F (36.8 C). Her blood pressure is 137/85 and her pulse is 81. Her respiration is 20 and oxygen saturation is 99%.      Mild hyperpigmentation in the treatment area with some erythema. No desquamation present.  ASSESSMENT: The patient is doing satisfactorily with treatment.  PLAN: We will continue with the patient's radiation treatment as planned.

## 2016-08-09 NOTE — Progress Notes (Signed)
Complex simulation note  Diagnosis: right-sided breast cancer  Narrative The patient has initially been planned to receive a course of whole breast radiation to a dose of 50.4 Gy in 28 fractions. The patient will now receive an additional boost to the seroma cavity which has been contoured. This will correspond to a boost of 10 Gy at 2 Gy per fraction. To accomplish this, an additional 3 customized blocks have been designed for this purpose. A complex isodose plan is requested to ensure that the target area is adequately covered with radiation dose and that the nearby normal structures such as the lung are adequately spared. The patient's final total dose will be 60.4 Gy.  ------------------------------------------------  Candace Loma S. Genice Kimberlin, MD, PhD 

## 2016-08-09 NOTE — Progress Notes (Signed)
Weekly rad tx Right breast,15/33 completed,  mild pink color on breast, skin intact, radiaplex bid used, appetite good, no pain. Wt Readings from Last 3 Encounters:  08/09/16 244 lb 12.8 oz (111 kg)  08/02/16 245 lb 3.2 oz (111.2 kg)  07/26/16 243 lb 12.8 oz (110.6 kg)  BP 137/85   Pulse 81   Temp 98.3 F (36.8 C) (Oral)   Resp 20   Ht 5\' 7"  (1.702 m)   Wt 244 lb 12.8 oz (111 kg)   SpO2 99%   BMI 38.34 kg/m

## 2016-08-12 ENCOUNTER — Ambulatory Visit
Admission: RE | Admit: 2016-08-12 | Discharge: 2016-08-12 | Disposition: A | Discharge status: Home / Self Care | Payer: 59 | Source: Ambulatory Visit | Attending: Radiation Oncology | Admitting: Radiation Oncology

## 2016-08-12 DIAGNOSIS — C50411 Malignant neoplasm of upper-outer quadrant of right female breast: Secondary | ICD-10-CM | POA: Diagnosis not present

## 2016-08-12 DIAGNOSIS — Z510 Encounter for antineoplastic radiation therapy: Secondary | ICD-10-CM | POA: Diagnosis not present

## 2016-08-12 DIAGNOSIS — Z170 Estrogen receptor positive status [ER+]: Secondary | ICD-10-CM | POA: Diagnosis not present

## 2016-08-13 ENCOUNTER — Ambulatory Visit
Admission: RE | Admit: 2016-08-13 | Discharge: 2016-08-13 | Disposition: A | Discharge status: Home / Self Care | Payer: 59 | Source: Ambulatory Visit | Attending: Radiation Oncology | Admitting: Radiation Oncology

## 2016-08-13 DIAGNOSIS — Z510 Encounter for antineoplastic radiation therapy: Secondary | ICD-10-CM | POA: Diagnosis not present

## 2016-08-13 DIAGNOSIS — Z170 Estrogen receptor positive status [ER+]: Secondary | ICD-10-CM | POA: Diagnosis not present

## 2016-08-13 DIAGNOSIS — C50411 Malignant neoplasm of upper-outer quadrant of right female breast: Secondary | ICD-10-CM | POA: Diagnosis not present

## 2016-08-14 ENCOUNTER — Ambulatory Visit
Admission: RE | Admit: 2016-08-14 | Discharge: 2016-08-14 | Disposition: A | Discharge status: Home / Self Care | Payer: 59 | Source: Ambulatory Visit | Attending: Radiation Oncology | Admitting: Radiation Oncology

## 2016-08-14 DIAGNOSIS — Z170 Estrogen receptor positive status [ER+]: Secondary | ICD-10-CM | POA: Diagnosis not present

## 2016-08-14 DIAGNOSIS — C50411 Malignant neoplasm of upper-outer quadrant of right female breast: Secondary | ICD-10-CM | POA: Diagnosis not present

## 2016-08-14 DIAGNOSIS — Z510 Encounter for antineoplastic radiation therapy: Secondary | ICD-10-CM | POA: Diagnosis not present

## 2016-08-15 ENCOUNTER — Ambulatory Visit
Admission: RE | Admit: 2016-08-15 | Discharge: 2016-08-15 | Disposition: A | Discharge status: Home / Self Care | Payer: 59 | Source: Ambulatory Visit | Attending: Radiation Oncology | Admitting: Radiation Oncology

## 2016-08-15 DIAGNOSIS — Z510 Encounter for antineoplastic radiation therapy: Secondary | ICD-10-CM | POA: Diagnosis not present

## 2016-08-15 DIAGNOSIS — Z170 Estrogen receptor positive status [ER+]: Secondary | ICD-10-CM | POA: Diagnosis not present

## 2016-08-15 DIAGNOSIS — C50411 Malignant neoplasm of upper-outer quadrant of right female breast: Secondary | ICD-10-CM | POA: Diagnosis not present

## 2016-08-16 ENCOUNTER — Ambulatory Visit
Admission: RE | Admit: 2016-08-16 | Discharge: 2016-08-16 | Disposition: A | Discharge status: Home / Self Care | Payer: 59 | Source: Ambulatory Visit | Attending: Radiation Oncology | Admitting: Radiation Oncology

## 2016-08-16 ENCOUNTER — Encounter: Payer: Self-pay | Admitting: Radiation Oncology

## 2016-08-16 ENCOUNTER — Other Ambulatory Visit: Payer: Self-pay | Admitting: *Deleted

## 2016-08-16 VITALS — BP 133/87 | HR 79 | Temp 98.7°F | Resp 20 | Wt 247.8 lb

## 2016-08-16 DIAGNOSIS — Z170 Estrogen receptor positive status [ER+]: Principal | ICD-10-CM

## 2016-08-16 DIAGNOSIS — C50411 Malignant neoplasm of upper-outer quadrant of right female breast: Secondary | ICD-10-CM | POA: Diagnosis not present

## 2016-08-16 DIAGNOSIS — Z510 Encounter for antineoplastic radiation therapy: Secondary | ICD-10-CM | POA: Diagnosis not present

## 2016-08-16 NOTE — Progress Notes (Signed)
Weekly rad txs right breast 20/33 completed,  Mild erythema, skin intact, using radaiplex bid,  Appetite good, no pain 9:30 AM BP 133/87 (BP Location: Left Arm, Patient Position: Sitting, Cuff Size: Large)   Pulse 79   Temp 98.7 F (37.1 C) (Oral)   Resp 20   Wt 247 lb 12.8 oz (112.4 kg)   BMI 38.81 kg/m   Wt Readings from Last 3 Encounters:  08/16/16 247 lb 12.8 oz (112.4 kg)  08/09/16 244 lb 12.8 oz (111 kg)  08/02/16 245 lb 3.2 oz (111.2 kg)

## 2016-08-18 NOTE — Progress Notes (Signed)
Department of Radiation Oncology  Phone:  [phone] Fax:        [phone]  Weekly Treatment Note    Name: Candace [name] Date: 08/18/2016 MRN: [id number] DOB: 06-10-1961   Diagnosis:     ICD-9-CM ICD-10-CM   1. Malignant neoplasm of upper-outer quadrant of right breast in female, estrogen receptor positive ([place]) 174.4 C50.411    V86.0 Z17.0      Current dose: 36 Gy  Current fraction: 20   MEDICATIONS: Current Outpatient Prescriptions  Medication Sig Dispense Refill  . buPROPion (WELLBUTRIN XL) 150 MG 24 hr tablet Take 150 mg by mouth daily.    . cetirizine (ZYRTEC) 10 MG tablet Take 10 mg by mouth daily. [facility]    . Cholecalciferol (VITAMIN D) 2000 units tablet Take 2,000 Units by mouth daily.    [name] esomeprazole (NEXIUM) 40 MG capsule Take 40 mg by mouth every 36 (thirty-six) hours.    [name] FLUoxetine (PROZAC) 10 MG capsule Take 10 mg by mouth daily with lunch.     . hyaluronate sodium (RADIAPLEXRX) GEL Apply 1 application topically 2 (two) times daily.    [name] ibuprofen (ADVIL,MOTRIN) 200 MG tablet Take 400-800 mg by mouth every 8 (eight) hours as needed (for pain.).    [name] Multiple Vitamin (MULTIVITAMIN WITH MINERALS) TABS tablet Take 1 tablet by mouth daily.    . polyvinyl alcohol (LIQUIFILM TEARS) 1.4 % ophthalmic solution Place 1 drop into both eyes 3 (three) times daily as needed for dry eyes.    [name] rOPINIRole (REQUIP) 0.25 MG tablet Take 0.25 mg by mouth daily at 8 pm.    . rosuvastatin (CRESTOR) 10 MG tablet Take 10 mg by mouth at bedtime.     No current facility-administered medications for this encounter.      ALLERGIES: Patient has no known allergies.   LABORATORY DATA:  Lab Results  Component Value Date   WBC 7.3 05/22/2016   HGB 14.2 05/22/2016   HCT 42.3 05/22/2016   MCV 85.8 05/22/2016   PLT 271 05/22/2016   Lab Results  Component Value Date   NA 140 05/02/2016   K 3.9 05/02/2016   CL 99 01/24/2013   CO2 28 05/02/2016     Lab Results  Component Value Date   ALT 32 05/02/2016   AST 20 05/02/2016   ALKPHOS 83 05/02/2016   BILITOT 0.27 05/02/2016     NARRATIVE: [name] was seen today for weekly treatment management. The chart was checked and the patient's films were reviewed.  The patient continues to do quite well. Some mild erythema but overall has been able to maintain her activity level.  Weekly rad txs right breast 20/33 completed,  Mild erythema, skin intact, using radaiplex bid,  Appetite good, no pain 5:42 PM BP 133/87 (BP Location: Left Arm, Patient Position: Sitting, Cuff Size: Large)   Pulse 79   Temp 98.7 F (37.1 C) (Oral)   Resp 20   Wt 247 lb 12.8 oz (112.4 kg)   BMI 38.81 kg/m   Wt Readings from Last 3 Encounters:  08/16/16 247 lb 12.8 oz (112.4 kg)  08/09/16 244 lb 12.8 oz (111 kg)  08/02/16 245 lb 3.2 oz (111.2 kg)    PHYSICAL EXAMINATION: weight is 247 lb 12.8 oz (112.4 kg). Her oral temperature is 98.7 F (37.1 C). Her blood pressure is 133/87 and her pulse is 79. Her respiration is 20.      Mild hyperpigmentation/erythema in the treatment area. No  moist desquamation.  ASSESSMENT: The patient is doing satisfactorily with treatment.  PLAN: We will continue with the patient's radiation treatment as planned.

## 2016-08-19 ENCOUNTER — Other Ambulatory Visit: Payer: 59

## 2016-08-19 ENCOUNTER — Ambulatory Visit: Payer: 59 | Admitting: Oncology

## 2016-08-19 ENCOUNTER — Ambulatory Visit
Admission: RE | Admit: 2016-08-19 | Discharge: 2016-08-19 | Disposition: A | Discharge status: Home / Self Care | Payer: 59 | Source: Ambulatory Visit | Attending: Radiation Oncology | Admitting: Radiation Oncology

## 2016-08-19 ENCOUNTER — Ambulatory Visit (HOSPITAL_BASED_OUTPATIENT_CLINIC_OR_DEPARTMENT_OTHER): Payer: 59 | Admitting: Oncology

## 2016-08-19 ENCOUNTER — Other Ambulatory Visit (HOSPITAL_BASED_OUTPATIENT_CLINIC_OR_DEPARTMENT_OTHER): Payer: 59

## 2016-08-19 VITALS — BP 152/87 | HR 83 | Temp 98.3°F | Resp 20 | Ht 67.0 in | Wt 246.7 lb

## 2016-08-19 DIAGNOSIS — Z170 Estrogen receptor positive status [ER+]: Secondary | ICD-10-CM

## 2016-08-19 DIAGNOSIS — C50411 Malignant neoplasm of upper-outer quadrant of right female breast: Secondary | ICD-10-CM | POA: Diagnosis not present

## 2016-08-19 DIAGNOSIS — Z79811 Long term (current) use of aromatase inhibitors: Secondary | ICD-10-CM

## 2016-08-19 DIAGNOSIS — Z510 Encounter for antineoplastic radiation therapy: Secondary | ICD-10-CM | POA: Diagnosis not present

## 2016-08-19 LAB — COMPREHENSIVE METABOLIC PANEL
ALT: 46 U/L (ref 0–55)
ANION GAP: 9 meq/L (ref 3–11)
AST: 29 U/L (ref 5–34)
Albumin: 3.9 g/dL (ref 3.5–5.0)
Alkaline Phosphatase: 74 U/L (ref 40–150)
BILIRUBIN TOTAL: 0.31 mg/dL (ref 0.20–1.20)
BUN: 9.6 mg/dL (ref 7.0–26.0)
CO2: 28 meq/L (ref 22–29)
CREATININE: 0.8 mg/dL (ref 0.6–1.1)
Calcium: 10.4 mg/dL (ref 8.4–10.4)
Chloride: 105 mEq/L (ref 98–109)
EGFR: 83 mL/min/{1.73_m2} — ABNORMAL LOW (ref >90)
GLUCOSE: 103 mg/dL (ref 70–140)
Potassium: 4.1 mEq/L (ref 3.5–5.1)
Sodium: 142 mEq/L (ref 136–145)
TOTAL PROTEIN: 7 g/dL (ref 6.4–8.3)

## 2016-08-19 LAB — CBC WITH DIFFERENTIAL/PLATELET
BASO%: 0.1 % (ref 0.0–2.0)
Basophils Absolute: 0 10*3/uL (ref 0.0–0.1)
EOS%: 2.5 % (ref 0.0–7.0)
Eosinophils Absolute: 0.2 10*3/uL (ref 0.0–0.5)
HCT: 42.1 % (ref 34.8–46.6)
HGB: 13.9 g/dL (ref 11.6–15.9)
LYMPH#: 1.7 10*3/uL (ref 0.9–3.3)
LYMPH%: 25.7 % (ref 14.0–49.7)
MCH: 28.4 pg (ref 25.1–34.0)
MCHC: 33 g/dL (ref 31.5–36.0)
MCV: 86.1 fL (ref 79.5–101.0)
MONO#: 0.9 10*3/uL (ref 0.1–0.9)
MONO%: 12.8 % (ref 0.0–14.0)
NEUT%: 58.9 % (ref 38.4–76.8)
NEUTROS ABS: 4 10*3/uL (ref 1.5–6.5)
PLATELETS: 241 10*3/uL (ref 145–400)
RBC: 4.89 10*6/uL (ref 3.70–5.45)
RDW: 12.6 % (ref 11.2–14.5)
WBC: 6.7 10*3/uL (ref 3.9–10.3)

## 2016-08-19 MED ORDER — ANASTROZOLE 1 MG PO TABS: 1.0000 mg | ORAL_TABLET | Freq: Every day | ORAL | 4 refills | Status: DC

## 2016-08-19 NOTE — Progress Notes (Signed)
[facility]  [phone] [phone]     ID: Candace [name] DOB: 10/25/1960  MR#: [id number]  CSN#:[id number]  Patient Care Team: Cynthia White, MD as PCP - General (Family Medicine)  C , MD as Consulting Physician (Oncology) Matthew Wakefield, MD as Consulting Physician (General Surgery) Vanessa P Haygood, MD as Consulting Physician (Obstetrics and Gynecology) Robert Buccini, MD as Consulting Physician (Gastroenterology) Daniel Jones, MD as Consulting Physician (Dermatology) , C, MD OTHER MD:  CHIEF COMPLAINT: Estrogen receptor positive breast cancer   CURRENT TREATMENT: To discuss the Oncotype and genetics results   BREAST CANCER HISTORY: From the original intake note:  Candace [name] [facility]. On 04/04/2016 she underwent right diagnostic mammography and tomography with right breast ultrasonography. The breast density was category B. In the upper outer quadrant of the breast there was a mass measuring 0.7 cm, with irregular margins. By ultrasonography this was located at the 10:00 position 9 cm from the nipple measuring 0.6 cm. Right axillary ultrasound was negative.  Biopsy of the right breast mass in question 04/05/2016 showed ([id number]) and invasive lobular carcinoma, grade 2 or 3, E-cadherin negative, estrogen receptor 90% positive, progesterone receptor 5% positive, both with strong staining intensity, with an MIB-1 of 20%, and no HER-2 amplification, the signals ratio being 0.82 and the number per cell 1.60.  Bilateral breast MRI was obtained 04/23/2016. This confirmed a 0.6 cm nodule in the upper-outer quadrant of the right breast with no additional sites of concern in either breast and no abnormal appearing lymph nodes.  Her subsequent history is as detailed below.  INTERVAL HISTORY: Candace [name] returns today for follow-up of her estrogen receptor  positive breast cancer. She is in the middle of adjuvant radiation, has about 12 treatments to go. She is tolerating this remarkably well. She has not had any significant skin changes. She does have some sensitivity in the breast however. She takes Advil for this sometimes. Over the weekend when she skips the radiation goes back to normal.  She has been able to continue to exercise pretty much on a daily basis at the gym on a 30-45 minutes of walking or elliptical. She rarely takes an afternoon nap.  REVIEW OF SYSTEMS: A detailed review of systems today was otherwise noncontributory   PAST MEDICAL HISTORY: Past Medical History:  Diagnosis Date  . Abnormal Pap smear   . Anovulation   . Breast cancer (HCC) 04/05/2016   right breast  . Cancer (HCC) 2017   right breast cancer  . Complication of anesthesia    slow to wake up  . Depression   . Endometrial hyperplasia   . Endometrial polyp    h/o  . Epidermal cyst    h/o  . GERD (gastroesophageal reflux disease)   . H/O hemorrhoids   . H/O menorrhagia   . High risk HPV infection   . Insomnia   . Oligomenorrhea   . Restless legs syndrome   . Seasonal allergies     PAST SURGICAL HISTORY: Past Surgical History:  Procedure Laterality Date  . BREAST LUMPECTOMY WITH RADIOACTIVE SEED AND SENTINEL LYMPH NODE BIOPSY Right 05/28/2016   Procedure: RADIOACTIVE SEED GUIDED RIGHT BREAST LUMPECTOMY WITH  RIGHT AXILLARY SENTINEL LYMPH NODE BIOPSY;  Surgeon: Matthew Wakefield, MD;  Location: [facility] OR;  Service: General;  Laterality: Right;  . CARPAL TUNNEL RELEASE  03/26/2011   right hand  . CARPAL TUNNEL RELEASE  05/28/2011   Procedure:   CARPAL TUNNEL RELEASE;  Surgeon: [name];  Location: [facility];  Service: Orthopedics;  Laterality: Left;  . CHOLECYSTECTOMY N/A 01/24/2013   Procedure: LAPAROSCOPIC CHOLECYSTECTOMY WITH INTRAOPERATIVE CHOLANGIOGRAM;  Surgeon: [name], MD;  Location: [facility];  Service: General;  Laterality: N/A;    . COLONOSCOPY    . HYSTEROSCOPY    . POLYPECTOMY    . TONSILLECTOMY  1967   as child  . trigger fingers  8 yrs. ago   both done months apart  . uterine ablation  06/04/2010  . uterine ablation      FAMILY HISTORY Family History  Problem Relation Age of Onset  . Heart disease Mother   . Hypertension Mother   . Stroke Mother   . Dementia Mother     vascular dementia  . Pulmonary fibrosis Father     d. 83  . Depression Father   . Heart attack Maternal Grandmother     d. [phone]  . Heart disease Maternal Grandmother   . Heart disease Maternal Grandfather     d. 60-63  . Leukemia Paternal Grandmother     d. late 54s  . Breast cancer Sister 78    IDC s/p mastectomy and chemo  Both of the patient's parents were only children. Her father died at age 70 from idiopathic pulmonary fibrosis. The patient's mother died at the age of 95 in the setting of dementia. The patient has one brother and one sister. Her sister was diagnosed at age 61 with breast cancer (a T1b invasive ductal carcinoma just behind the nipple, requiring mastectomy. Her Oncotype was 24, and she received chemotherapy 3. She is now doing well).  GYNECOLOGIC HISTORY:  No LMP recorded. Patient has had an ablation. Menarche age 65, first live birth age ? She is GX P2. She stopped having periods December 2012 when she underwent endometrial ablation. 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 She is divorced and lives at home with her daughters                and [name] (age 69 and 35 as of November 2017) and 2 dogs. The patient's was brought up as an [place].    ADVANCED DIRECTIVES: In place. The patient's sister Halford Decamp is her healthcare part of attorney   HEALTH MAINTENANCE: Social History  Substance Use Topics  . Smoking status: Never Smoker  . Smokeless  tobacco: Never Used  . Alcohol use No     Comment: none since 1996     Colonoscopy:  PAP:  Bone density: To be scheduled   No Known Allergies  Current Outpatient Prescriptions  Medication Sig Dispense Refill  . buPROPion (WELLBUTRIN XL) 150 MG 24 hr tablet Take 150 mg by mouth daily.    . cetirizine (ZYRTEC) 10 MG tablet Take 10 mg by mouth daily. [facility]    . Cholecalciferol (VITAMIN D) 2000 units tablet Take 2,000 Units by mouth daily.    [name] esomeprazole (NEXIUM) 40 MG capsule Take 40 mg by mouth every 36 (thirty-six) hours.    [name] FLUoxetine (PROZAC) 10 MG capsule Take 10 mg by mouth daily with lunch.     . hyaluronate sodium (RADIAPLEXRX) GEL Apply 1 application topically 2 (two) times daily.    [name] ibuprofen (ADVIL,MOTRIN) 200 MG tablet Take 400-800 mg by mouth every 8 (eight) hours as needed (  for pain.).    [name] Multiple Vitamin (MULTIVITAMIN WITH MINERALS) TABS tablet Take 1 tablet by mouth daily.    . polyvinyl alcohol (LIQUIFILM TEARS) 1.4 % ophthalmic solution Place 1 drop into both eyes 3 (three) times daily as needed for dry eyes.    [name] rOPINIRole (REQUIP) 0.25 MG tablet Take 0.25 mg by mouth daily at 8 pm.    . rosuvastatin (CRESTOR) 10 MG tablet Take 10 mg by mouth at bedtime.     No current facility-administered medications for this visit.     OBJECTIVE:Middle-aged white woman In no acute distress Vitals:   08/19/16 0917  BP: (!) 152/87  Pulse: 83  Resp: 20  Temp: 98.3 F (36.8 C)     Body mass index is 38.64 kg/m.    ECOG FS:0 - Asymptomatic  Sclerae unicteric, EOMs intact Oropharynx clear and moist No cervical or supraclavicular adenopathy Lungs no rales or rhonchi Heart regular rate and rhythm Abd soft, nontender, positive bowel sounds MSK no focal spinal tenderness, no upper extremity lymphedema Neuro: nonfocal, well oriented, appropriate affect Breasts: The right breast is status post lumpectomy and is currently receiving radiation.  There is very minimal or edema. The cosmetic result is excellent. There is no evidence of local recurrence. Both axillae are benign.  LAB RESULTS:  CMP     Component Value Date/Time   NA 140 05/02/2016 1538   K 3.9 05/02/2016 1538   CL 99 01/24/2013 0316   CO2 28 05/02/2016 1538   GLUCOSE 109 05/02/2016 1538   BUN 12.4 05/02/2016 1538   CREATININE 0.8 05/02/2016 1538   CALCIUM 9.7 05/02/2016 1538   PROT 7.0 05/02/2016 1538   ALBUMIN 3.7 05/02/2016 1538   AST 20 05/02/2016 1538   ALT 32 05/02/2016 1538   ALKPHOS 83 05/02/2016 1538   BILITOT 0.27 05/02/2016 1538   GFRNONAA 83 (L) 01/24/2013 0316   GFRAA >90 01/24/2013 0316    INo results found for: SPEP, UPEP  Lab Results  Component Value Date   WBC 6.7 08/19/2016   NEUTROABS 4.0 08/19/2016   HGB 13.9 08/19/2016   HCT 42.1 08/19/2016   MCV 86.1 08/19/2016   PLT 241 08/19/2016      Chemistry      Component Value Date/Time   NA 140 05/02/2016 1538   K 3.9 05/02/2016 1538   CL 99 01/24/2013 0316   CO2 28 05/02/2016 1538   BUN 12.4 05/02/2016 1538   CREATININE 0.8 05/02/2016 1538      Component Value Date/Time   CALCIUM 9.7 05/02/2016 1538   ALKPHOS 83 05/02/2016 1538   AST 20 05/02/2016 1538   ALT 32 05/02/2016 1538   BILITOT 0.27 05/02/2016 1538       No results found for: LABCA2  No components found for: LABCA125  No results for input(s): INR in the last 168 hours.  Urinalysis    Component Value Date/Time   COLORURINE YELLOW 01/24/2013 0327   APPEARANCEUR TURBID (A) 01/24/2013 0327   LABSPEC 1.021 01/24/2013 0327   PHURINE 7.5 01/24/2013 0327   GLUCOSEU NEGATIVE 01/24/2013 0327   HGBUR NEGATIVE 01/24/2013 0327   BILIRUBINUR NEGATIVE 01/24/2013 0327   KETONESUR 15 (A) 01/24/2013 0327   PROTEINUR NEGATIVE 01/24/2013 0327   UROBILINOGEN 0.2 01/24/2013 0327   NITRITE NEGATIVE 01/24/2013 0327   LEUKOCYTESUR NEGATIVE 01/24/2013 0327     STUDIES: No results found.  ELIGIBLE FOR AVAILABLE  RESEARCH PROTOCOL: no  ASSESSMENT: 56 y.o. BRCA negative Candace [name] woman status  post right breast upper outer quadrant biopsy 04/05/2016 for a clinical T1b N0, stage IA invasive lobular breast cancer, grade 2 or 3, estrogen and progesterone receptor positive, HER-2 not amplified, with an MIB-1 of 20%  (1) genetics testing 04/23/2016 through the [place] offered by [facility] Hope Pigeon, MD)  found no deleterious mutations in  ATM, BARD1, BRCA1, BRCA2, BRIP1, CDH1, CHEK2, FANCC, MLH1, MSH2, MSH6, NBN, PALB2, PMS2, PTEN, RAD51C, RAD51D, TP53, and XRCC2.  This panel also includes deletion/duplication analysis (without sequencing) for one gene, EPCAM  (2) status post right lumpectomy and sentinel lymph node sampling 05/28/2016 for a pT1b pN0, stage IA invasive lobular breast cancer, grade 2, with negative margins  (3) Oncotype Dx score of 22 predicts a 10 year risk of recurrence outside the breast of 14% if the patient's only systemic therapy is tamoxifen for 5 years. It predicts a benefit from chemotherapy in the 4% range.   Patient opted against adjuvant chemotherapy(a)   (4) adjuvant radiation to be completed 08/29/2016  (5) to start anastrozole September 29 2016   PLAN: Candace [name] is tolerating her radiation treatments remarkably well and she has been able to continue to work full-time and also go to [employer]. I let her know that it would not be surprising if she became more fatigued as the treatment progressed.  For that reason we are not going to start her anti-estrogens until 09/29/2016  Today we discussed the possible toxicities, side effects and complications of anastrozole in detail I am setting her up for [facility] next [facility].  She will return to see me early June. At that point we will review her tolerance of anastrozole and the results of the bone density and we will consider starting long-term follow-up    Chauncey Cruel, MD   08/19/2016 9:22 AM Medical Oncology and Hematology [facility] [place], [place] [address] Tel. [phone]    Fax. [phone]

## 2016-08-20 ENCOUNTER — Ambulatory Visit
Admission: RE | Admit: 2016-08-20 | Discharge: 2016-08-20 | Disposition: A | Discharge status: Home / Self Care | Payer: 59 | Source: Ambulatory Visit | Attending: Radiation Oncology | Admitting: Radiation Oncology

## 2016-08-20 DIAGNOSIS — Z510 Encounter for antineoplastic radiation therapy: Secondary | ICD-10-CM | POA: Diagnosis not present

## 2016-08-20 DIAGNOSIS — Z170 Estrogen receptor positive status [ER+]: Secondary | ICD-10-CM | POA: Diagnosis not present

## 2016-08-20 DIAGNOSIS — C50411 Malignant neoplasm of upper-outer quadrant of right female breast: Secondary | ICD-10-CM | POA: Diagnosis not present

## 2016-08-21 ENCOUNTER — Ambulatory Visit
Admission: RE | Admit: 2016-08-21 | Discharge: 2016-08-21 | Disposition: A | Discharge status: Home / Self Care | Payer: 59 | Source: Ambulatory Visit | Attending: Radiation Oncology | Admitting: Radiation Oncology

## 2016-08-21 DIAGNOSIS — E559 Vitamin D deficiency, unspecified: Secondary | ICD-10-CM | POA: Diagnosis not present

## 2016-08-21 DIAGNOSIS — Z510 Encounter for antineoplastic radiation therapy: Secondary | ICD-10-CM | POA: Diagnosis not present

## 2016-08-21 DIAGNOSIS — E785 Hyperlipidemia, unspecified: Secondary | ICD-10-CM | POA: Diagnosis not present

## 2016-08-21 DIAGNOSIS — F419 Anxiety disorder, unspecified: Secondary | ICD-10-CM | POA: Diagnosis not present

## 2016-08-21 DIAGNOSIS — R7301 Impaired fasting glucose: Secondary | ICD-10-CM | POA: Diagnosis not present

## 2016-08-21 DIAGNOSIS — Z0000 Encounter for general adult medical examination without abnormal findings: Secondary | ICD-10-CM | POA: Diagnosis not present

## 2016-08-21 DIAGNOSIS — K219 Gastro-esophageal reflux disease without esophagitis: Secondary | ICD-10-CM | POA: Diagnosis not present

## 2016-08-21 DIAGNOSIS — C50411 Malignant neoplasm of upper-outer quadrant of right female breast: Secondary | ICD-10-CM | POA: Diagnosis not present

## 2016-08-21 DIAGNOSIS — G2581 Restless legs syndrome: Secondary | ICD-10-CM | POA: Diagnosis not present

## 2016-08-21 DIAGNOSIS — Z170 Estrogen receptor positive status [ER+]: Secondary | ICD-10-CM | POA: Diagnosis not present

## 2016-08-21 MED FILL — FLUoxetine HCL 10 MG TABS: 10 | 90 days supply | Qty: 90 | Fill #0

## 2016-08-21 MED FILL — rOPINIRole HCL 0.25 MG TABS: 0.25 | 90 days supply | Qty: 90 | Fill #2

## 2016-08-22 ENCOUNTER — Ambulatory Visit: Payer: 59 | Admitting: Radiation Oncology

## 2016-08-22 ENCOUNTER — Ambulatory Visit
Admission: RE | Admit: 2016-08-22 | Discharge: 2016-08-22 | Disposition: A | Discharge status: Home / Self Care | Payer: 59 | Source: Ambulatory Visit | Attending: Radiation Oncology | Admitting: Radiation Oncology

## 2016-08-22 DIAGNOSIS — Z170 Estrogen receptor positive status [ER+]: Secondary | ICD-10-CM | POA: Diagnosis not present

## 2016-08-22 DIAGNOSIS — C50411 Malignant neoplasm of upper-outer quadrant of right female breast: Secondary | ICD-10-CM | POA: Diagnosis not present

## 2016-08-22 DIAGNOSIS — Z510 Encounter for antineoplastic radiation therapy: Secondary | ICD-10-CM | POA: Diagnosis not present

## 2016-08-23 ENCOUNTER — Ambulatory Visit
Admission: RE | Admit: 2016-08-23 | Discharge: 2016-08-23 | Disposition: A | Discharge status: Home / Self Care | Payer: 59 | Source: Ambulatory Visit | Attending: Radiation Oncology | Admitting: Radiation Oncology

## 2016-08-23 ENCOUNTER — Encounter: Payer: Self-pay | Admitting: Radiation Oncology

## 2016-08-23 VITALS — BP 134/84 | HR 86 | Temp 98.2°F | Resp 20 | Wt 246.6 lb

## 2016-08-23 DIAGNOSIS — C50411 Malignant neoplasm of upper-outer quadrant of right female breast: Secondary | ICD-10-CM | POA: Diagnosis not present

## 2016-08-23 DIAGNOSIS — Z170 Estrogen receptor positive status [ER+]: Secondary | ICD-10-CM | POA: Diagnosis not present

## 2016-08-23 DIAGNOSIS — Z510 Encounter for antineoplastic radiation therapy: Secondary | ICD-10-CM | POA: Diagnosis not present

## 2016-08-23 MED FILL — FLUoxetine HCL 10 MG CAPS: 10 | 90 days supply | Qty: 90 | Fill #1

## 2016-08-23 NOTE — Progress Notes (Signed)
wekly rad tx right breast mild erythema, more ao under right axilla, skin intact, using radiaplex bid, appetite good, tenderness under right axilla 9:11 AM BP 134/84 (BP Location: Left Arm, Patient Position: Sitting, Cuff Size: Normal)   Pulse 86   Temp 98.2 F (36.8 C) (Oral)   Resp 20   Wt 246 lb 9.6 oz (111.9 kg)   BMI 38.62 kg/m   Wt Readings from Last 3 Encounters:  08/23/16 246 lb 9.6 oz (111.9 kg)  08/19/16 246 lb 11.2 oz (111.9 kg)  08/16/16 247 lb 12.8 oz (112.4 kg)

## 2016-08-25 NOTE — Progress Notes (Signed)
Department of Radiation Oncology  Phone:  [phone] Fax:        [phone]  Weekly Treatment Note    Name: Candace [name] Date: 08/25/2016 MRN: [id number] DOB: August 08, 1960   Diagnosis:     ICD-9-CM ICD-10-CM   1. Malignant neoplasm of upper-outer quadrant of right breast in female, estrogen receptor positive ([place]) 174.4 C50.411    V86.0 Z17.0      Current dose: 45 Gy  Current fraction: 25   MEDICATIONS: Current Outpatient Prescriptions  Medication Sig Dispense Refill  . anastrozole (ARIMIDEX) 1 MG tablet Take 1 tablet (1 mg total) by mouth daily. 90 tablet 4  . buPROPion (WELLBUTRIN XL) 150 MG 24 hr tablet Take 150 mg by mouth daily.    . cetirizine (ZYRTEC) 10 MG tablet Take 10 mg by mouth daily. [facility]    . Cholecalciferol (VITAMIN D) 2000 units tablet Take 2,000 Units by mouth daily.    [name] esomeprazole (NEXIUM) 40 MG capsule Take 40 mg by mouth every 36 (thirty-six) hours.    [name] FLUoxetine (PROZAC) 10 MG capsule Take 10 mg by mouth daily with lunch.     . hyaluronate sodium (RADIAPLEXRX) GEL Apply 1 application topically 2 (two) times daily.    [name] ibuprofen (ADVIL,MOTRIN) 200 MG tablet Take 400-800 mg by mouth every 8 (eight) hours as needed (for pain.).    [name] Multiple Vitamin (MULTIVITAMIN WITH MINERALS) TABS tablet Take 1 tablet by mouth daily.    . polyvinyl alcohol (LIQUIFILM TEARS) 1.4 % ophthalmic solution Place 1 drop into both eyes 3 (three) times daily as needed for dry eyes.    [name] rOPINIRole (REQUIP) 0.25 MG tablet Take 0.25 mg by mouth daily at 8 pm.    . rosuvastatin (CRESTOR) 10 MG tablet Take 10 mg by mouth at bedtime.     No current facility-administered medications for this encounter.      ALLERGIES: Patient has no known allergies.   LABORATORY DATA:  Lab Results  Component Value Date   WBC 6.7 08/19/2016   HGB 13.9 08/19/2016   HCT 42.1 08/19/2016   MCV 86.1 08/19/2016   PLT 241 08/19/2016   Lab Results  Component  Value Date   NA 142 08/19/2016   K 4.1 08/19/2016   CL 99 01/24/2013   CO2 28 08/19/2016   Lab Results  Component Value Date   ALT 46 08/19/2016   AST 29 08/19/2016   ALKPHOS 74 08/19/2016   BILITOT 0.31 08/19/2016     NARRATIVE: Candace [name] was seen today for weekly treatment management. The chart was checked and the patient's films were reviewed.  The patient is doing well after 5 weeks of treatment. Some continued skin change. We discussed beginning the boost treatment next week.  wekly rad tx right breast mild erythema, more ao under right axilla, skin intact, using radiaplex bid, appetite good, tenderness under right axilla 2:53 PM BP 134/84 (BP Location: Left Arm, Patient Position: Sitting, Cuff Size: Normal)   Pulse 86   Temp 98.2 F (36.8 C) (Oral)   Resp 20   Wt 246 lb 9.6 oz (111.9 kg)   BMI 38.62 kg/m   Wt Readings from Last 3 Encounters:  08/23/16 246 lb 9.6 oz (111.9 kg)  08/19/16 246 lb 11.2 oz (111.9 kg)  08/16/16 247 lb 12.8 oz (112.4 kg)    PHYSICAL EXAMINATION: weight is 246 lb 9.6 oz (111.9 kg). Her oral temperature is 98.2 F (36.8 C). Her blood pressure  is 134/84 and her pulse is 86. Her respiration is 20.      Hyperpigmentation with some erythema present. No moist desquamation.  ASSESSMENT: The patient is doing satisfactorily with treatment.  PLAN: We will continue with the patient's radiation treatment as planned.

## 2016-08-26 ENCOUNTER — Ambulatory Visit
Admission: RE | Admit: 2016-08-26 | Discharge: 2016-08-26 | Disposition: A | Discharge status: Home / Self Care | Payer: 59 | Source: Ambulatory Visit | Attending: Radiation Oncology | Admitting: Radiation Oncology

## 2016-08-26 DIAGNOSIS — C50411 Malignant neoplasm of upper-outer quadrant of right female breast: Secondary | ICD-10-CM | POA: Diagnosis not present

## 2016-08-26 DIAGNOSIS — Z170 Estrogen receptor positive status [ER+]: Secondary | ICD-10-CM | POA: Diagnosis not present

## 2016-08-26 DIAGNOSIS — Z510 Encounter for antineoplastic radiation therapy: Secondary | ICD-10-CM | POA: Diagnosis not present

## 2016-08-27 ENCOUNTER — Ambulatory Visit
Admission: RE | Admit: 2016-08-27 | Discharge: 2016-08-27 | Disposition: A | Discharge status: Home / Self Care | Payer: 59 | Source: Ambulatory Visit | Attending: Radiation Oncology | Admitting: Radiation Oncology

## 2016-08-27 DIAGNOSIS — C50411 Malignant neoplasm of upper-outer quadrant of right female breast: Secondary | ICD-10-CM | POA: Diagnosis not present

## 2016-08-27 DIAGNOSIS — Z510 Encounter for antineoplastic radiation therapy: Secondary | ICD-10-CM | POA: Diagnosis not present

## 2016-08-27 DIAGNOSIS — Z170 Estrogen receptor positive status [ER+]: Secondary | ICD-10-CM | POA: Diagnosis not present

## 2016-08-27 MED FILL — ANASTROZOLE 1 MG TABLET: 1 | 90 days supply | Qty: 90 | Fill #0

## 2016-08-28 ENCOUNTER — Ambulatory Visit
Admission: RE | Admit: 2016-08-28 | Discharge: 2016-08-28 | Disposition: A | Discharge status: Home / Self Care | Payer: 59 | Source: Ambulatory Visit | Attending: Radiation Oncology | Admitting: Radiation Oncology

## 2016-08-28 DIAGNOSIS — C50411 Malignant neoplasm of upper-outer quadrant of right female breast: Secondary | ICD-10-CM | POA: Diagnosis not present

## 2016-08-28 DIAGNOSIS — Z170 Estrogen receptor positive status [ER+]: Secondary | ICD-10-CM | POA: Diagnosis not present

## 2016-08-28 DIAGNOSIS — Z510 Encounter for antineoplastic radiation therapy: Secondary | ICD-10-CM | POA: Diagnosis not present

## 2016-08-29 ENCOUNTER — Ambulatory Visit
Admission: RE | Admit: 2016-08-29 | Discharge: 2016-08-29 | Disposition: A | Discharge status: Home / Self Care | Payer: 59 | Source: Ambulatory Visit | Attending: Radiation Oncology | Admitting: Radiation Oncology

## 2016-08-29 DIAGNOSIS — Z510 Encounter for antineoplastic radiation therapy: Secondary | ICD-10-CM | POA: Diagnosis not present

## 2016-08-29 DIAGNOSIS — C50411 Malignant neoplasm of upper-outer quadrant of right female breast: Secondary | ICD-10-CM

## 2016-08-29 DIAGNOSIS — Z170 Estrogen receptor positive status [ER+]: Principal | ICD-10-CM

## 2016-08-29 MED ORDER — RADIAPLEXRX EX GEL
Freq: Once | CUTANEOUS | Status: AC
  Administered 2016-08-29: 09:00:00 via TOPICAL

## 2016-08-30 ENCOUNTER — Ambulatory Visit
Admission: RE | Admit: 2016-08-30 | Discharge: 2016-08-30 | Disposition: A | Discharge status: Home / Self Care | Payer: 59 | Source: Ambulatory Visit | Attending: Radiation Oncology | Admitting: Radiation Oncology

## 2016-08-30 ENCOUNTER — Encounter: Payer: Self-pay | Admitting: Radiation Oncology

## 2016-08-30 VITALS — BP 134/87 | HR 85 | Temp 97.9°F | Resp 20 | Wt 246.2 lb

## 2016-08-30 DIAGNOSIS — C50411 Malignant neoplasm of upper-outer quadrant of right female breast: Secondary | ICD-10-CM

## 2016-08-30 DIAGNOSIS — Z170 Estrogen receptor positive status [ER+]: Secondary | ICD-10-CM | POA: Diagnosis not present

## 2016-08-30 DIAGNOSIS — Z510 Encounter for antineoplastic radiation therapy: Secondary | ICD-10-CM | POA: Diagnosis not present

## 2016-08-30 NOTE — Progress Notes (Signed)
weekly rad txs right breast 30/33 bright erythema ,raw looking under  Axilla and incision, slight pelling, rest of breast  Mild erythema, nipple dry, tender, using radiplex bid, no itching stated, has mild fatigue and takes daily naps now 8:35 AM BP 134/87 (BP Location: Left Arm, Patient Position: Sitting, Cuff Size: Large)   Pulse 85   Temp 97.9 F (36.6 C) (Oral)   Resp 20   Wt 246 lb 3.2 oz (111.7 kg)   BMI 38.56 kg/m   Wt Readings from Last 3 Encounters:  08/30/16 246 lb 3.2 oz (111.7 kg)  08/23/16 246 lb 9.6 oz (111.9 kg)  08/19/16 246 lb 11.2 oz (111.9 kg)

## 2016-08-30 NOTE — Progress Notes (Signed)
   Department of Radiation Oncology  Phone:  [phone] Fax:        [phone]  Weekly Treatment Note    Name: Candace [name] Date: 08/30/2016 MRN: [id number] DOB: March 25, 1961   Diagnosis:     ICD-9-CM ICD-10-CM   1. Malignant neoplasm of upper-outer quadrant of right breast in female, estrogen receptor positive ([place]) 174.4 C50.411    V86.0 Z17.0      Current dose: 54.4 Gy  Current fraction: 30   MEDICATIONS: Current Outpatient Prescriptions  Medication Sig Dispense Refill  . anastrozole (ARIMIDEX) 1 MG tablet Take 1 tablet (1 mg total) by mouth daily. 90 tablet 4  . buPROPion (WELLBUTRIN XL) 150 MG 24 hr tablet Take 150 mg by mouth daily.    . cetirizine (ZYRTEC) 10 MG tablet Take 10 mg by mouth daily. [facility]    . Cholecalciferol (VITAMIN D) 2000 units tablet Take 2,000 Units by mouth daily.    [name] esomeprazole (NEXIUM) 40 MG capsule Take 40 mg by mouth every 36 (thirty-six) hours.    [name] FLUoxetine (PROZAC) 10 MG capsule Take 10 mg by mouth daily with lunch.     . hyaluronate sodium (RADIAPLEXRX) GEL Apply 1 application topically 2 (two) times daily.    [name] ibuprofen (ADVIL,MOTRIN) 200 MG tablet Take 400-800 mg by mouth every 8 (eight) hours as needed (for pain.).    [name] Multiple Vitamin (MULTIVITAMIN WITH MINERALS) TABS tablet Take 1 tablet by mouth daily.    . polyvinyl alcohol (LIQUIFILM TEARS) 1.4 % ophthalmic solution Place 1 drop into both eyes 3 (three) times daily as needed for dry eyes.    [name] rOPINIRole (REQUIP) 0.25 MG tablet Take 0.25 mg by mouth daily at 8 pm.    . rosuvastatin (CRESTOR) 10 MG tablet Take 10 mg by mouth at bedtime.     No current facility-administered medications for this encounter.      ALLERGIES: Patient has no known allergies.   LABORATORY DATA:  Lab Results  Component Value Date   WBC 6.7 08/19/2016   HGB 13.9 08/19/2016   HCT 42.1 08/19/2016   MCV 86.1 08/19/2016   PLT 241 08/19/2016   Lab Results  Component  Value Date   NA 142 08/19/2016   K 4.1 08/19/2016   CL 99 01/24/2013   CO2 28 08/19/2016   Lab Results  Component Value Date   ALT 46 08/19/2016   AST 29 08/19/2016   ALKPHOS 74 08/19/2016   BILITOT 0.31 08/19/2016     NARRATIVE: Candace [name] was seen today for weekly treatment management. The chart was checked and the patient's films were reviewed.  The patient is doing well overall. Some fatigue. She has noticed some increased irritation in the axilla.  PHYSICAL EXAMINATION: weight is 246 lb 3.2 oz (111.7 kg). Her oral temperature is 97.9 F (36.6 C). Her blood pressure is 134/87 and her pulse is 85. Her respiration is 20.      Erythema present in the treatment area with some brighter, darker erythema especially in the right axilla.  ASSESSMENT: The patient is doing satisfactorily with treatment.  The patient's skin looks satisfactorily. Where she is treatment. She is going to finish next week and I will see her on her last day.  PLAN: We will continue with the patient's radiation treatment as planned.

## 2016-09-02 ENCOUNTER — Ambulatory Visit
Admission: RE | Admit: 2016-09-02 | Discharge: 2016-09-02 | Disposition: A | Discharge status: Home / Self Care | Payer: 59 | Source: Ambulatory Visit | Attending: Radiation Oncology | Admitting: Radiation Oncology

## 2016-09-02 DIAGNOSIS — Z170 Estrogen receptor positive status [ER+]: Secondary | ICD-10-CM | POA: Diagnosis not present

## 2016-09-02 DIAGNOSIS — C50411 Malignant neoplasm of upper-outer quadrant of right female breast: Secondary | ICD-10-CM | POA: Diagnosis not present

## 2016-09-02 DIAGNOSIS — Z510 Encounter for antineoplastic radiation therapy: Secondary | ICD-10-CM | POA: Diagnosis not present

## 2016-09-03 ENCOUNTER — Ambulatory Visit
Admission: RE | Admit: 2016-09-03 | Discharge: 2016-09-03 | Disposition: A | Discharge status: Home / Self Care | Payer: 59 | Source: Ambulatory Visit | Attending: Radiation Oncology | Admitting: Radiation Oncology

## 2016-09-03 DIAGNOSIS — C50411 Malignant neoplasm of upper-outer quadrant of right female breast: Secondary | ICD-10-CM | POA: Diagnosis not present

## 2016-09-03 DIAGNOSIS — Z170 Estrogen receptor positive status [ER+]: Secondary | ICD-10-CM | POA: Diagnosis not present

## 2016-09-03 DIAGNOSIS — Z510 Encounter for antineoplastic radiation therapy: Secondary | ICD-10-CM | POA: Diagnosis not present

## 2016-09-04 ENCOUNTER — Encounter: Payer: Self-pay | Admitting: Radiation Oncology

## 2016-09-04 ENCOUNTER — Ambulatory Visit
Admission: RE | Admit: 2016-09-04 | Discharge: 2016-09-04 | Disposition: A | Discharge status: Home / Self Care | Payer: 59 | Source: Ambulatory Visit | Attending: Radiation Oncology | Admitting: Radiation Oncology

## 2016-09-04 ENCOUNTER — Telehealth: Payer: Self-pay | Admitting: *Deleted

## 2016-09-04 VITALS — BP 154/91 | HR 95 | Temp 97.8°F | Resp 18 | Wt 252.8 lb

## 2016-09-04 DIAGNOSIS — C50411 Malignant neoplasm of upper-outer quadrant of right female breast: Secondary | ICD-10-CM | POA: Diagnosis not present

## 2016-09-04 DIAGNOSIS — Z510 Encounter for antineoplastic radiation therapy: Secondary | ICD-10-CM | POA: Diagnosis not present

## 2016-09-04 DIAGNOSIS — Z170 Estrogen receptor positive status [ER+]: Secondary | ICD-10-CM | POA: Diagnosis not present

## 2016-09-04 NOTE — Progress Notes (Signed)
Department of Radiation Oncology  Phone:  [phone] Fax:        [phone]  Weekly Treatment Note    Name: Candace [name] Date: 09/04/2016 MRN: [id number] DOB: April 04, 1961   Diagnosis:     ICD-9-CM ICD-10-CM   1. Malignant neoplasm of upper-outer quadrant of right breast in female, estrogen receptor positive ([place]) 174.4 C50.411    V86.0 Z17.0      Current dose: 60.4 Gy  Current fraction: 33   MEDICATIONS: Current Outpatient Prescriptions  Medication Sig Dispense Refill  . anastrozole (ARIMIDEX) 1 MG tablet Take 1 tablet (1 mg total) by mouth daily. 90 tablet 4  . buPROPion (WELLBUTRIN XL) 150 MG 24 hr tablet Take 150 mg by mouth daily.    . cetirizine (ZYRTEC) 10 MG tablet Take 10 mg by mouth daily. [facility]    . Cholecalciferol (VITAMIN D) 2000 units tablet Take 2,000 Units by mouth daily.    [name] esomeprazole (NEXIUM) 40 MG capsule Take 40 mg by mouth every 36 (thirty-six) hours.    [name] FLUoxetine (PROZAC) 10 MG capsule Take 10 mg by mouth daily with lunch.     . hyaluronate sodium (RADIAPLEXRX) GEL Apply 1 application topically 2 (two) times daily.    [name] ibuprofen (ADVIL,MOTRIN) 200 MG tablet Take 400-800 mg by mouth every 8 (eight) hours as needed (for pain.).    [name] Multiple Vitamin (MULTIVITAMIN WITH MINERALS) TABS tablet Take 1 tablet by mouth daily.    . polyvinyl alcohol (LIQUIFILM TEARS) 1.4 % ophthalmic solution Place 1 drop into both eyes 3 (three) times daily as needed for dry eyes.    [name] rOPINIRole (REQUIP) 0.25 MG tablet Take 0.25 mg by mouth daily at 8 pm.    . rosuvastatin (CRESTOR) 10 MG tablet Take 10 mg by mouth at bedtime.     No current facility-administered medications for this encounter.      ALLERGIES: Patient has no known allergies.   LABORATORY DATA:  Lab Results  Component Value Date   WBC 6.7 08/19/2016   HGB 13.9 08/19/2016   HCT 42.1 08/19/2016   MCV 86.1 08/19/2016   PLT 241 08/19/2016   Lab Results  Component  Value Date   NA 142 08/19/2016   K 4.1 08/19/2016   CL 99 01/24/2013   CO2 28 08/19/2016   Lab Results  Component Value Date   ALT 46 08/19/2016   AST 29 08/19/2016   ALKPHOS 74 08/19/2016   BILITOT 0.31 08/19/2016     NARRATIVE: Candace [name] was seen today for weekly treatment management. The chart was checked and the patient's films were reviewed.  The patient finishes her final fraction today. She is in very good spirits about this. Some continued irritation of the skin but no major changes in this.  Weekly rad tx right breast 32/33 completed,, here before tx, to be seen , dry  Desquamation on incision , nipple  Dry peeling, using neosporin  On those areas, erythma using radiaplex bid, has enough cream, f/u appt with Shona Simpson 10/08/16, appetite good, mild fatigue 9:52 PM BP (!) 154/91 (BP Location: Left Arm, Patient Position: Sitting, Cuff Size: Large)   Pulse 95   Temp 97.8 F (36.6 C) (Oral)   Resp 18   Wt 252 lb 12.8 oz (114.7 kg)   BMI 39.59 kg/m   Wt Readings from Last 3 Encounters:  09/04/16 252 lb 12.8 oz (114.7 kg)  08/30/16 246 lb 3.2 oz (111.7 kg)  08/23/16  246 lb 9.6 oz (111.9 kg)    PHYSICAL EXAMINATION: weight is 252 lb 12.8 oz (114.7 kg). Her oral temperature is 97.8 F (36.6 C). Her blood pressure is 154/91 (abnormal) and her pulse is 95. Her respiration is 18.      Dry desquamation present, especially in the axilla and the inframammary region. No moist desquamation.  ASSESSMENT: The patient is doing satisfactorily with treatment.  The patient finished her final fraction treatment today.  PLAN: Follow-up in one month.

## 2016-09-04 NOTE — Progress Notes (Addendum)
Weekly rad tx right breast 32/33 completed,, here before tx, to be seen , dry  Desquamation on incision , nipple  Dry peeling, using neosporin  On those areas, erythma using radiaplex bid, has enough cream, f/u appt with Shona Simpson 10/08/16, appetite good, mild fatigue 8:34 AM BP (!) 154/91 (BP Location: Left Arm, Patient Position: Sitting, Cuff Size: Large)   Pulse 95   Temp 97.8 F (36.6 C) (Oral)   Resp 18   Wt 252 lb 12.8 oz (114.7 kg)   BMI 39.59 kg/m   Wt Readings from Last 3 Encounters:  09/04/16 252 lb 12.8 oz (114.7 kg)  08/30/16 246 lb 3.2 oz (111.7 kg)  08/23/16 246 lb 9.6 oz (111.9 kg)

## 2016-09-04 NOTE — Telephone Encounter (Signed)
  Oncology Nurse Navigator Documentation  Navigator Location: [facility] (09/04/16 1100)   )Navigator Encounter Type: Telephone (09/04/16 1100) Telephone: Outgoing Call (09/04/16 1100)  Spoke to pt and congratulate on completion of xrt. Pt relate doing well and without complaints. Encourage pt to call with needs. Discussed survivorship program. Contact information provided.                 Patient Visit Type: RadOnc (09/04/16 1100) Treatment Phase: Final Radiation Tx (09/04/16 1100) Barriers/Navigation Needs: No barriers at this time;No Questions;No Needs (09/04/16 1100)   Interventions: Referrals (09/04/16 1100) Referrals: Survivorship (09/04/16 1100)          Acuity: Level 1 (09/04/16 1100)         Time Spent with Patient: 15 (09/04/16 1100)

## 2016-09-09 ENCOUNTER — Encounter: Payer: Self-pay | Admitting: Radiation Oncology

## 2016-09-09 NOTE — Progress Notes (Signed)
  Radiation Oncology         [phone] ________________________________  Name: Candace [name] MRN: [id number]  Date: 09/09/2016  DOB: 1960/12/09  End of Treatment Note  Diagnosis: Stage IA, pT1b pN0, ER/PR positive grade 2, invasive lobular breast cancer, of the right breast     Indication for treatment:  Curative       Radiation treatment dates:   07/22/16-09/04/16  Site/dose:   1) Right breast/ 50.4 Gy in 28 fractions   2) Right breast boost/ 10 Gy in 5 fractions  Beams/energy:   1) 3D / 10X, 15X    2) Isodose plan/ 10X, 15X  Narrative: The patient tolerated radiation treatment relatively well. During treatment, the patient exhibited dry desquamation and erythema on incision site and nipple for which she was using Neosporin.  Plan: The patient has completed radiation treatment. The patient will return to radiation oncology clinic for routine followup in one month. I advised them to call or return sooner if they have any questions or concerns related to their recovery or treatment.  ------------------------------------------------  [name], MD, PhD  This document serves as a record of services personally performed by Kyung Rudd, MD. It was created on his behalf by Bethann Humble, a trained medical scribe. The creation of this record is based on the scribe's personal observations and the provider's statements to them. This document has been checked and approved by the attending provider.

## 2016-09-10 DIAGNOSIS — Z6839 Body mass index (BMI) 39.0-39.9, adult: Secondary | ICD-10-CM | POA: Diagnosis not present

## 2016-09-10 DIAGNOSIS — Z124 Encounter for screening for malignant neoplasm of cervix: Secondary | ICD-10-CM | POA: Diagnosis not present

## 2016-09-10 DIAGNOSIS — Z01419 Encounter for gynecological examination (general) (routine) without abnormal findings: Secondary | ICD-10-CM | POA: Diagnosis not present

## 2016-09-13 MED FILL — ROSUVASTATIN CALCIUM 10 MG: 10 | 30 days supply | Qty: 30 | Fill #2

## 2016-09-16 ENCOUNTER — Ambulatory Visit: Payer: 59 | Admitting: Oncology

## 2016-09-19 MED FILL — ESOMEPRAZOLE MAG DR 40 MG C: 40 | 90 days supply | Qty: 90 | Fill #1

## 2016-09-30 NOTE — Progress Notes (Addendum)
Candace [name] 56 y.o. woman with Stage IA, pT1b pN0, ER/PR positive grade 2, invasive lobular breast cancer, of the right breast  radiation completed 09-04-16 one month FU.  Skin status:Right breast with mild tanning. What lotion are you using? Lotion with vitamin E. Have you seen med onc? If not, when is appointment: 08-19-16 Dr. Jana Hakim If they are ER+, have they started Al or Tamoxifen? If not, why? 09-29-16 Anastrozole Discuss survivorship appointment. 11-07-16 at [facility] Have you had a mammogram scheduled? Not scheduled yet Offer referral to [facility]. Will receive at the survivorship appointment.11-07-16 Oncotype Dx. Score:Oncotype Dx score of 22 predicts a 10 year risk of recurrence outside the breast of 14% Appetite:Good eating three meals per day. Pain:None Fatigue:Last week got her regular energy level back. Arm mobility:Able to raise her right her without difficulty. Lymphedema:None Wt Readings from Last 3 Encounters:  10/08/16 249 lb 9.6 oz (113.2 kg)  09/04/16 252 lb 12.8 oz (114.7 kg)  08/30/16 246 lb 3.2 oz (111.7 kg)  BP (!) 151/95   Pulse (!) 102   Temp 99 F (37.2 C) (Oral)   Resp 18   Ht 5\' 7"  (1.702 m)   Wt 249 lb 9.6 oz (113.2 kg)   SpO2 97%   BMI 39.09 kg/m

## 2016-10-08 ENCOUNTER — Ambulatory Visit
Admission: RE | Admit: 2016-10-08 | Discharge: 2016-10-08 | Disposition: A | Discharge status: Home / Self Care | Payer: 59 | Source: Ambulatory Visit | Attending: Radiation Oncology | Admitting: Radiation Oncology

## 2016-10-08 ENCOUNTER — Encounter: Payer: Self-pay | Admitting: Radiation Oncology

## 2016-10-08 VITALS — BP 151/95 | HR 102 | Temp 99.0°F | Resp 18 | Ht 67.0 in | Wt 249.6 lb

## 2016-10-08 DIAGNOSIS — C50911 Malignant neoplasm of unspecified site of right female breast: Secondary | ICD-10-CM | POA: Insufficient documentation

## 2016-10-08 DIAGNOSIS — Z170 Estrogen receptor positive status [ER+]: Secondary | ICD-10-CM | POA: Diagnosis not present

## 2016-10-08 DIAGNOSIS — Z79899 Other long term (current) drug therapy: Secondary | ICD-10-CM | POA: Diagnosis not present

## 2016-10-08 DIAGNOSIS — Z79811 Long term (current) use of aromatase inhibitors: Secondary | ICD-10-CM | POA: Diagnosis not present

## 2016-10-08 DIAGNOSIS — Z923 Personal history of irradiation: Secondary | ICD-10-CM | POA: Diagnosis not present

## 2016-10-08 DIAGNOSIS — C50411 Malignant neoplasm of upper-outer quadrant of right female breast: Secondary | ICD-10-CM

## 2016-10-08 NOTE — Progress Notes (Signed)
Radiation Oncology         [phone] ________________________________  Name: Candace [name] MRN: [id number]  Date: 10/08/2016  DOB: 08-Aug-1960  Post Treatment Note  CC: Vidal Schwalbe, MD  Magrinat, Virgie Dad, MD  Diagnosis:   Stage IA, pT1b pN0, ER/PR positive grade 2, invasive lobular breast cancer, of the right breast    Interval Since Last Radiation:  4 weeks   07/22/16-09/04/16 1. Right breast/ 50.4 Gy in 28 fractions 2. Right breast boost/ 10 Gy in 5 fractions   Narrative:  The patient returns today for routine follow-up. During treatment she did very well with radiotherapy and did not have significant desquamation. She started Arimidex about 1 week ago.                            On review of systems, the patient states she's doing great overall. She denies any significant concerns since starting Arimidex. She denies hot flashes, night sweats, or significant joint aches, or pains. She reports she is using lotion with vitamin e on her right breast. She denies any concerns with skin breakdown at this time.   ALLERGIES:  has No Known Allergies.  Meds: Current Outpatient Prescriptions  Medication Sig Dispense Refill  . anastrozole (ARIMIDEX) 1 MG tablet Take 1 tablet (1 mg total) by mouth daily. 90 tablet 4  . buPROPion (WELLBUTRIN XL) 150 MG 24 hr tablet Take 150 mg by mouth daily.    . cetirizine (ZYRTEC) 10 MG tablet Take 10 mg by mouth daily. [facility]    . Cholecalciferol (VITAMIN D) 2000 units tablet Take 2,000 Units by mouth daily.    [name] esomeprazole (NEXIUM) 40 MG capsule Take 40 mg by mouth every 36 (thirty-six) hours.    [name] FLUoxetine (PROZAC) 10 MG capsule Take 10 mg by mouth daily with lunch.     . ibuprofen (ADVIL,MOTRIN) 200 MG tablet Take 400-800 mg by mouth every 8 (eight) hours as needed (for pain.).    [name] Multiple Vitamin (MULTIVITAMIN WITH MINERALS) TABS tablet Take 1 tablet by mouth daily.    [name] rOPINIRole (REQUIP) 0.25 MG tablet Take  0.25 mg by mouth daily at 8 pm.    . rosuvastatin (CRESTOR) 10 MG tablet Take 10 mg by mouth at bedtime.    . polyvinyl alcohol (LIQUIFILM TEARS) 1.4 % ophthalmic solution Place 1 drop into both eyes 3 (three) times daily as needed for dry eyes.     No current facility-administered medications for this encounter.     Physical Findings:  height is 5\' 7"  (1.702 m) and weight is 249 lb 9.6 oz (113.2 kg). Her oral temperature is 99 F (37.2 C). Her blood pressure is 151/95 (abnormal) and her pulse is 102 (abnormal). Her respiration is 18 and oxygen saturation is 97%.  Pain Assessment Pain Score: 0-No pain/10 In general this is a well appearing caucasian female in no acute distress. She's alert and oriented x4 and appropriate throughout the examination. Cardiopulmonary assessment is negative for acute distress and she exhibits normal effort. The right breast was examined and reveals hyperpigmentation along the areola and lateral chest wall/breast. No desquamation is noted.   Lab Findings: Lab Results  Component Value Date   WBC 6.7 08/19/2016   HGB 13.9 08/19/2016   HCT 42.1 08/19/2016   MCV 86.1 08/19/2016   PLT 241 08/19/2016     Radiographic Findings: No results found.  Impression/Plan: 1. Stage IA, pT1b  pN0, ER/PR positive grade 2, invasive lobular breast cancer, of the right breast. The patient has been doing well since completion of radiotherapy. We discussed that we would be happy to continue to follow her as needed, but she will also continue to follow up with Dr. Jana Hakim for her Arimidex in medical oncology. She was counseled on skin care as well as measures to avoid sun exposure to this area.  2. Survivorship. The patient is scheduled to see survivorship clinic in May and we gave her materials for survivorship/supportive care activities here in the cancer center.     Carola Rhine, PAC

## 2016-10-08 NOTE — Addendum Note (Signed)
Encounter addended by: Malena Edman, RN on: 10/08/2016 11:11 AM<BR>    Actions taken: Charge Capture section accepted

## 2016-10-16 MED FILL — ROSUVASTATIN CALCIUM 10 MG: 10 | 30 days supply | Qty: 30 | Fill #3

## 2016-10-28 MED FILL — BUPROPION HCL XL 150 MG TAB: 150 | 90 days supply | Qty: 90 | Fill #0

## 2016-11-06 NOTE — Progress Notes (Deleted)
CLINIC:  Survivorship   REASON FOR VISIT:  Routine follow-up post-treatment for a recent history of breast cancer.  BRIEF ONCOLOGIC HISTORY:    Malignant neoplasm of upper-outer quadrant of right breast in female, estrogen receptor positive ([place])   04/05/2016 Initial Biopsy         05/02/2016 Initial Diagnosis    Malignant neoplasm of upper-outer quadrant of right breast in female, estrogen receptor positive [facility])      Surgery          Genetic Testing    Patient has genetic testing done for ***. Results revealed patient has the following mutation(s): ***       Oncotype testing          Anti-estrogen oral therapy          Radiation Therapy     The patient saw No care team member to display for radiation treatment. This is the current list of radiation treatment: Radiation Treatments    Patient's record has no active or historical radiation treatments documented.           INTERVAL HISTORY:  Candace [name] presents to the [facility] today for our initial meeting to review her survivorship care plan detailing her treatment course for breast cancer, as well as monitoring long-term side effects of that treatment, education regarding health maintenance, screening, and overall wellness and health promotion.     Overall, Candace [name] reports feeling quite well since completing her radiation therapy approximately 3 months ago.  She ***    REVIEW OF SYSTEMS:  ***  Breast: Denies any new nodularity, masses, tenderness, nipple changes, or nipple discharge.    A 14-point review of systems was completed and was negative, except as noted above.   ONCOLOGY TREATMENT TEAM:  1. Surgeon:  Dr. Harolyn Rutherford*** at [facility] 2. Medical Oncologist: Dr. Magrinat/Gudena/Feng***  3. Radiation Oncologist: Dr. Verl Bangs***    PAST MEDICAL/SURGICAL HISTORY:  Past Medical History:  Diagnosis Date  . Abnormal Pap smear     . Anovulation   . Breast cancer ([place]) 04/05/2016   right breast  . Cancer ([place]) 2017   right breast cancer  . Complication of anesthesia    slow to wake up  . Depression   . Endometrial hyperplasia   . Endometrial polyp    h/o  . Epidermal cyst    h/o  . GERD (gastroesophageal reflux disease)   . H/O hemorrhoids   . H/O menorrhagia   . High risk HPV infection   . Insomnia   . Oligomenorrhea   . Restless legs syndrome   . Seasonal allergies    Past Surgical History:  Procedure Laterality Date  . BREAST LUMPECTOMY WITH RADIOACTIVE SEED AND SENTINEL LYMPH NODE BIOPSY Right 05/28/2016   Procedure: RADIOACTIVE SEED GUIDED RIGHT BREAST LUMPECTOMY WITH  RIGHT AXILLARY SENTINEL LYMPH NODE BIOPSY;  Surgeon: [name], MD;  Location: [facility];  Service: General;  Laterality: Right;  . CARPAL TUNNEL RELEASE  03/26/2011   right hand  . CARPAL TUNNEL RELEASE  05/28/2011   Procedure: CARPAL TUNNEL RELEASE;  Surgeon: [name];  Location: [facility];  Service: Orthopedics;  Laterality: Left;  . CHOLECYSTECTOMY N/A 01/24/2013   Procedure: LAPAROSCOPIC CHOLECYSTECTOMY WITH INTRAOPERATIVE CHOLANGIOGRAM;  Surgeon: [name], MD;  Location: [facility];  Service: General;  Laterality: N/A;  . COLONOSCOPY    . HYSTEROSCOPY    . POLYPECTOMY    . TONSILLECTOMY  1967   as child  . trigger  fingers  8 yrs. ago   both done months apart  . uterine ablation  06/04/2010  . uterine ablation       ALLERGIES:  No Known Allergies   CURRENT MEDICATIONS:  Outpatient Encounter Prescriptions as of 11/07/2016  Medication Sig Note  . anastrozole (ARIMIDEX) 1 MG tablet Take 1 tablet (1 mg total) by mouth daily.   [name] buPROPion (WELLBUTRIN XL) 150 MG 24 hr tablet Take 150 mg by mouth daily.   . cetirizine (ZYRTEC) 10 MG tablet Take 10 mg by mouth daily. [facility]   . Cholecalciferol (VITAMIN D) 2000 units tablet Take 2,000 Units by mouth daily.   [name] esomeprazole  (NEXIUM) 40 MG capsule Take 40 mg by mouth every 36 (thirty-six) hours.   [name] FLUoxetine (PROZAC) 10 MG capsule Take 10 mg by mouth daily with lunch.    . ibuprofen (ADVIL,MOTRIN) 200 MG tablet Take 400-800 mg by mouth every 8 (eight) hours as needed (for pain.).   [name] Multiple Vitamin (MULTIVITAMIN WITH MINERALS) TABS tablet Take 1 tablet by mouth daily.   . polyvinyl alcohol (LIQUIFILM TEARS) 1.4 % ophthalmic solution Place 1 drop into both eyes 3 (three) times daily as needed for dry eyes.   [name] rOPINIRole (REQUIP) 0.25 MG tablet Take 0.25 mg by mouth daily at 8 pm. 05/17/2016: Between 2000 & 2100  . rosuvastatin (CRESTOR) 10 MG tablet Take 10 mg by mouth at bedtime.    No facility-administered encounter medications on file as of 11/07/2016.      ONCOLOGIC FAMILY HISTORY:  Family History  Problem Relation Age of Onset  . Heart disease Mother   . Hypertension Mother   . Stroke Mother   . Dementia Mother     vascular dementia  . Pulmonary fibrosis Father     d. 30  . Depression Father   . Heart attack Maternal Grandmother     d. [phone]  . Heart disease Maternal Grandmother   . Heart disease Maternal Grandfather     d. 60-63  . Leukemia Paternal Grandmother     d. late 43s  . Breast cancer Sister 87    IDC s/p mastectomy and chemo     GENETIC COUNSELING/TESTING: ***  SOCIAL HISTORY:  Candace [name] is /single/married/divorced/widowed/separated and lives alone/with her spouse/family/friend in (city), [place].  She has (#) children and they live in (city).  Candace [name] is currently retired/disabled/working part-time/full-time as ***.  She denies any current or history of tobacco, alcohol, or illicit drug use.     PHYSICAL EXAMINATION:  Vital Signs:  There were no vitals filed for this visit. There were no vitals filed for this visit. General: Well-nourished, well-appearing female in no acute distress.  She is unaccompanied/accompanied in clinic by her ***** today.   HEENT: Head  is normocephalic.  Pupils equal and reactive to light. Conjunctivae clear without exudate.  Sclerae anicteric. Oral mucosa is pink, moist.  Oropharynx is pink without lesions or erythema.  Lymph: No cervical, supraclavicular, or infraclavicular lymphadenopathy noted on palpation.  Cardiovascular: Regular rate and rhythm.[name] Respiratory: Clear to auscultation bilaterally. Chest expansion symmetric; breathing non-labored.  GI: Abdomen soft and round; non-tender, non-distended. Bowel sounds normoactive.  GU: Deferred.  Neuro: No focal deficits. Steady gait.  Psych: Mood and affect normal and appropriate for situation.  Extremities: No edema. Skin: Warm and dry.  LABORATORY DATA:  None for this visit.  DIAGNOSTIC IMAGING:  None for this visit.      ASSESSMENT AND PLAN:  Ms.. [name] is a pleasant 56 y.o. female with Stage *** right/left breast invasive ductal carcinoma, ER+/PR+/HER2-, diagnosed in (date), treated with lumpectomy, adjuvant radiation therapy, and anti-estrogen therapy with *** beginning in (date).  She presents to the Survivorship Clinic for our initial meeting and routine follow-up post-completion of treatment for breast cancer.    1. Stage *** right/left breast cancer:  Candace [name] is continuing to recover from definitive treatment for breast cancer. She will follow-up with her medical oncologist, Dr. Ross Ludwig in (month) /2017 with history and physical exam per surveillance protocol.  She will continue her anti-estrogen therapy with (drug). Thus far, she is tolerating the *** well, with minimal side effects. She was instructed to make Dr. Lindi Adie or myself aware if she begins to experience any worsening side effects of the medication and I could see her back in clinic to help manage those side effects, as needed. Though the incidence is low, there is an associated risk of endometrial cancer with anti-estrogen therapies like Tamoxifen.  Candace [name] was encouraged to contact Dr.  Carrington [name] or myself with any vaginal bleeding while taking Tamoxifen. Other side effects of Tamoxifen were again reviewed with her as well. Today, a comprehensive survivorship care plan and treatment summary was reviewed with the patient today detailing her breast cancer diagnosis, treatment course, potential late/long-term effects of treatment, appropriate follow-up care with recommendations for the future, and patient education resources.  A copy of this summary, along with a letter will be sent to the patient's primary care provider via mail/fax/In Basket message after today's visit.    #. Problem(s) at Visit______________  #. Bone health:  Given Candace [name] age/history of breast cancer and her current treatment regimen including anti-estrogen therapy with _______, she is at risk for bone demineralization.  Her last DEXA scan was **/**/20**, which showed (results).***  In the meantime, she was encouraged to increase her consumption of foods rich in calcium, as well as increase her weight-bearing activities.  She was given education on specific activities to promote bone health.  #. Cancer screening:  Due to Candace [name]'s history and her age, she should receive screening for skin cancers, colon cancer, and gynecologic cancers.  The information and recommendations are listed on the patient's comprehensive care plan/treatment summary and were reviewed in detail with the patient.    #. Health maintenance and wellness promotion: Candace [name] was encouraged to consume 5-7 servings of fruits and vegetables per day. We reviewed the "Nutrition Rainbow" handout, as well as the handout "Take Control of Your Health and Reduce Your Cancer Risk" from the [place].  She was also encouraged to engage in moderate to vigorous exercise for 30 minutes per day most days of the week. We discussed the [employer] fitness program, which is designed for cancer survivors to help them become more physically fit after  cancer treatments.  She was instructed to limit her alcohol consumption and continue to abstain from tobacco use/***was encouraged stop smoking.     #. Support services/counseling: It is not uncommon for this period of the patient's cancer care trajectory to be one of many emotions and stressors.  We discussed an opportunity for her to participate in the next session of [facility] ("Finding Your New Normal") support group series designed for patients after they have completed treatment.   Candace [name] was encouraged to take advantage of our many other support services programs, support groups, and/or counseling in coping with her new life as a cancer survivor after  completing anti-cancer treatment.  She was offered support today through active listening and expressive supportive counseling.  She was given information regarding our available services and encouraged to contact me with any questions or for help enrolling in any of our support group/programs.    Dispo:   -Return to cancer center ***  -She is welcome to return back to the Survivorship Clinic at any time; no additional follow-up needed at this time.  -Consider referral back to survivorship as a long-term survivor for continued surveillance  A total of (#) minutes of face-to-face time was spent with this patient with greater than 50% of that time in counseling and care-coordination.   Charlestine Massed, NP Survivorship Program [facility] [phone]   Note: PRIMARY CARE PROVIDER [name], [place] [phone]

## 2016-11-07 ENCOUNTER — Encounter: Payer: 59 | Admitting: Adult Health

## 2016-11-15 MED FILL — ROSUVASTATIN CALCIUM 10 MG: 10 | 30 days supply | Qty: 30 | Fill #4

## 2016-11-15 MED FILL — rOPINIRole HCL 0.25 MG TABS: 0.25 | 90 days supply | Qty: 90 | Fill #3

## 2016-12-02 ENCOUNTER — Other Ambulatory Visit: Payer: Self-pay | Admitting: Adult Health

## 2016-12-02 DIAGNOSIS — C50411 Malignant neoplasm of upper-outer quadrant of right female breast: Secondary | ICD-10-CM

## 2016-12-02 DIAGNOSIS — Z170 Estrogen receptor positive status [ER+]: Principal | ICD-10-CM

## 2016-12-03 ENCOUNTER — Other Ambulatory Visit: Payer: 59

## 2016-12-03 ENCOUNTER — Ambulatory Visit: Payer: 59 | Admitting: Oncology

## 2016-12-12 ENCOUNTER — Encounter (HOSPITAL_COMMUNITY): Payer: Self-pay

## 2016-12-13 ENCOUNTER — Encounter (HOSPITAL_COMMUNITY): Payer: Self-pay

## 2016-12-18 MED FILL — ROSUVASTATIN CALCIUM 10 MG: 10 | 30 days supply | Qty: 30 | Fill #5

## 2017-01-02 MED FILL — ESOMEPRAZOLE MAG DR 40 MG C: 40 | 90 days supply | Qty: 90 | Fill #2

## 2017-01-02 MED FILL — ANASTROZOLE 1 MG TABLET: 1 | 90 days supply | Qty: 90 | Fill #1

## 2017-01-09 ENCOUNTER — Other Ambulatory Visit: Payer: Self-pay | Admitting: Oncology

## 2017-01-09 ENCOUNTER — Other Ambulatory Visit (HOSPITAL_BASED_OUTPATIENT_CLINIC_OR_DEPARTMENT_OTHER): Payer: 59

## 2017-01-09 ENCOUNTER — Ambulatory Visit (HOSPITAL_BASED_OUTPATIENT_CLINIC_OR_DEPARTMENT_OTHER): Payer: 59 | Admitting: Oncology

## 2017-01-09 VITALS — BP 127/81 | HR 93 | Temp 97.9°F | Resp 18 | Ht 67.0 in | Wt 245.0 lb

## 2017-01-09 DIAGNOSIS — C50411 Malignant neoplasm of upper-outer quadrant of right female breast: Secondary | ICD-10-CM | POA: Diagnosis not present

## 2017-01-09 DIAGNOSIS — Z170 Estrogen receptor positive status [ER+]: Secondary | ICD-10-CM

## 2017-01-09 DIAGNOSIS — Z79811 Long term (current) use of aromatase inhibitors: Secondary | ICD-10-CM | POA: Diagnosis not present

## 2017-01-09 DIAGNOSIS — E2839 Other primary ovarian failure: Secondary | ICD-10-CM

## 2017-01-09 LAB — COMPREHENSIVE METABOLIC PANEL
ALBUMIN: 4 g/dL (ref 3.5–5.0)
ALK PHOS: 83 U/L (ref 40–150)
ALT: 34 U/L (ref 0–55)
ANION GAP: 9 meq/L (ref 3–11)
AST: 26 U/L (ref 5–34)
BILIRUBIN TOTAL: 0.4 mg/dL (ref 0.20–1.20)
BUN: 13.8 mg/dL (ref 7.0–26.0)
CALCIUM: 10.3 mg/dL (ref 8.4–10.4)
CO2: 27 mEq/L (ref 22–29)
CREATININE: 0.9 mg/dL (ref 0.6–1.1)
Chloride: 103 mEq/L (ref 98–109)
EGFR: 73 mL/min/{1.73_m2} — ABNORMAL LOW (ref >90)
Glucose: 101 mg/dl (ref 70–140)
Potassium: 4.3 mEq/L (ref 3.5–5.1)
Sodium: 139 mEq/L (ref 136–145)
TOTAL PROTEIN: 7.1 g/dL (ref 6.4–8.3)

## 2017-01-09 LAB — CBC WITH DIFFERENTIAL/PLATELET
BASO%: 0.2 % (ref 0.0–2.0)
Basophils Absolute: 0 10*3/uL (ref 0.0–0.1)
EOS%: 2.1 % (ref 0.0–7.0)
Eosinophils Absolute: 0.1 10*3/uL (ref 0.0–0.5)
HEMATOCRIT: 43.8 % (ref 34.8–46.6)
HEMOGLOBIN: 14.5 g/dL (ref 11.6–15.9)
LYMPH#: 1.5 10*3/uL (ref 0.9–3.3)
LYMPH%: 22.5 % (ref 14.0–49.7)
MCH: 28.6 pg (ref 25.1–34.0)
MCHC: 33.1 g/dL (ref 31.5–36.0)
MCV: 86.4 fL (ref 79.5–101.0)
MONO#: 0.8 10*3/uL (ref 0.1–0.9)
MONO%: 11.5 % (ref 0.0–14.0)
NEUT#: 4.2 10*3/uL (ref 1.5–6.5)
NEUT%: 63.7 % (ref 38.4–76.8)
PLATELETS: 239 10*3/uL (ref 145–400)
RBC: 5.07 10*6/uL (ref 3.70–5.45)
RDW: 12.4 % (ref 11.2–14.5)
WBC: 6.6 10*3/uL (ref 3.9–10.3)

## 2017-01-09 NOTE — Progress Notes (Signed)
[place]  [phone] [phone]     ID: KEARIE MENNEN DOB: [birth year]  MR#: [id number]  MLY#:[id number]  Patient Care Team: [name], MD as PCP - General (Family Medicine) Mauricio Dahlen, Virgie Dad, MD as Consulting Physician (Oncology) [name], MD as Consulting Physician (General Surgery) Leo Grosser, Seymour Bars, MD as Consulting Physician (Obstetrics and Gynecology) Ronald Lobo, MD as Consulting Physician (Gastroenterology) Danella Sensing, MD as Consulting Physician (Dermatology) Delice Bison, Charlestine Massed, NP as Nurse Practitioner (Hematology and Oncology) Chauncey Cruel, MD OTHER MD:  CHIEF COMPLAINT: Estrogen receptor positive breast cancer   CURRENT TREATMENT: Anastrozole   BREAST CANCER HISTORY: From the original intake note:  Jaydalyn had routine screening mammography with tomography at the [facility] 03/29/2016. On 04/04/2016 she underwent right diagnostic mammography and tomography with right breast ultrasonography. The breast density was category B. In the upper outer quadrant of the breast there was a mass measuring 0.7 cm, with irregular margins. By ultrasonography this was located at the 10:00 position 9 cm from the nipple measuring 0.6 cm. Right axillary ultrasound was negative.  Biopsy of the right breast mass in question 04/05/2016 showed ([id number]) and invasive lobular carcinoma, grade 2 or 3, E-cadherin negative, estrogen receptor 90% positive, progesterone receptor 5% positive, both with strong staining intensity, with an MIB-1 of 20%, and no HER-2 amplification, the signals ratio being 0.82 and the number per cell 1.60.  Bilateral breast MRI was obtained 04/23/2016. This confirmed a 0.6 cm nodule in the upper-outer quadrant of the right breast with no additional sites of concern in either breast and no abnormal appearing lymph nodes.  Her subsequent history is as detailed below.  INTERVAL HISTORY: Latandra  returns today for follow-up and treatment of her estrogen receptor positive breast cancer. She completed her radiation treatments in early March. After the fifth week she had significant fatigue and some peeling but within a month of stopping the radiation all at was "close to normal". We gave her a few weeks before starting anastrozole and then she started on that drug 09/29/2016. She is tolerating it remarkably well. She feels a little warmer but doesn't really have true hot flashes. Vaginal dryness is not an issue. She has had no arthralgias or myalgias. She obtains it at an excellent cost.   returns today for follow-up of her estrogen receptor positive breast cancer. She is in the middle of adjuvant radiation, has about 12 treatments to go. She is tolerating this remarkably well. She has not had any significant skin changes. She does have some sensitivity in the breast however. She takes Advil for this sometimes. Over the weekend when she skips the radiation goes back to normal.  She has been able to continue to exercise pretty much on a daily basis at the gym on a 30-45 minutes of walking or elliptical. She rarely takes an afternoon nap.  REVIEW OF SYSTEMS: The only concern she has noted is some changes in the lower part of her right breast which are most likely due to radiation. Otherwise she is very active, traveling, enjoying time with family at the beach, and a detailed review of systems today was noncontributory except as noted   PAST MEDICAL HISTORY: Past Medical History:  Diagnosis Date  . Abnormal Pap smear   . Anovulation   . Breast cancer ([place]) 04/05/2016   right breast  . Cancer ([place]) 2017   right breast cancer  . Complication of anesthesia    slow to wake up  .  Depression   . Endometrial hyperplasia   . Endometrial polyp    h/o  . Epidermal cyst    h/o  . GERD (gastroesophageal reflux disease)   . H/O hemorrhoids   . H/O menorrhagia   . High risk HPV infection   .  Insomnia   . Oligomenorrhea   . Restless legs syndrome   . Seasonal allergies     PAST SURGICAL HISTORY: Past Surgical History:  Procedure Laterality Date  . BREAST LUMPECTOMY WITH RADIOACTIVE SEED AND SENTINEL LYMPH NODE BIOPSY Right 05/28/2016   Procedure: RADIOACTIVE SEED GUIDED RIGHT BREAST LUMPECTOMY WITH  RIGHT AXILLARY SENTINEL LYMPH NODE BIOPSY;  Surgeon: [name], MD;  Location: [facility];  Service: General;  Laterality: Right;  . CARPAL TUNNEL RELEASE  03/26/2011   right hand  . CARPAL TUNNEL RELEASE  05/28/2011   Procedure: CARPAL TUNNEL RELEASE;  Surgeon: [name];  Location: [facility];  Service: Orthopedics;  Laterality: Left;  . CHOLECYSTECTOMY N/A 01/24/2013   Procedure: LAPAROSCOPIC CHOLECYSTECTOMY WITH INTRAOPERATIVE CHOLANGIOGRAM;  Surgeon: [name], MD;  Location: [facility];  Service: General;  Laterality: N/A;  . COLONOSCOPY    . HYSTEROSCOPY    . POLYPECTOMY    . TONSILLECTOMY  1967   as child  . trigger fingers  8 yrs. ago   both done months apart  . uterine ablation  06/04/2010  . uterine ablation      FAMILY HISTORY Family History  Problem Relation Age of Onset  . Heart disease Mother   . Hypertension Mother   . Stroke Mother   . Dementia Mother        vascular dementia  . Pulmonary fibrosis Father        d. 37  . Depression Father   . Heart attack Maternal Grandmother        d. [phone]  . Heart disease Maternal Grandmother   . Heart disease Maternal Grandfather        d. 60-63  . Leukemia Paternal Grandmother        d. late 83s  . Breast cancer Sister 64       IDC s/p mastectomy and chemo  Both of the patient's parents were only children. Her father died at age 68 from idiopathic pulmonary fibrosis. The patient's mother died at the age of 27 in the setting of dementia. The patient has one brother and one sister. Her sister was diagnosed at age 3 with breast cancer (a T1b invasive ductal carcinoma just behind the nipple,  requiring mastectomy. Her Oncotype was 24, and she received chemotherapy 3. She is now doing well).  GYNECOLOGIC HISTORY:  No LMP recorded. Patient has had an ablation. Menarche age 21, first live birth age ? She is GX P2. She stopped having periods December 2012 when she underwent endometrial ablation. 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 She is divorced and lives at home with her daughters                and [name] (age 84 and 67 as of November 2017) and 2 dogs. The patient's was brought up as an [place].    ADVANCED DIRECTIVES: In place. The patient's sister Halford Decamp is her healthcare part of attorney   HEALTH MAINTENANCE: Social History  Substance Use Topics  . Smoking status: Never Smoker  . Smokeless tobacco: Never  Used  . Alcohol use No     Comment: none since 1996     Colonoscopy:  PAP:  Bone density: To be scheduled   No Known Allergies  Current Outpatient Prescriptions  Medication Sig Dispense Refill  . anastrozole (ARIMIDEX) 1 MG tablet Take 1 tablet (1 mg total) by mouth daily. 90 tablet 4  . buPROPion (WELLBUTRIN XL) 150 MG 24 hr tablet Take 150 mg by mouth daily.    . cetirizine (ZYRTEC) 10 MG tablet Take 10 mg by mouth daily. [facility]    . Cholecalciferol (VITAMIN D) 2000 units tablet Take 2,000 Units by mouth daily.    [name] esomeprazole (NEXIUM) 40 MG capsule Take 40 mg by mouth every 36 (thirty-six) hours.    [name] FLUoxetine (PROZAC) 10 MG capsule Take 10 mg by mouth daily with lunch.     . ibuprofen (ADVIL,MOTRIN) 200 MG tablet Take 400-800 mg by mouth every 8 (eight) hours as needed (for pain.).    [name] Multiple Vitamin (MULTIVITAMIN WITH MINERALS) TABS tablet Take 1 tablet by mouth daily.    . polyvinyl alcohol (LIQUIFILM TEARS) 1.4 % ophthalmic solution Place 1 drop into both eyes 3  (three) times daily as needed for dry eyes.    [name] rOPINIRole (REQUIP) 0.25 MG tablet Take 0.25 mg by mouth daily at 8 pm.    . rosuvastatin (CRESTOR) 10 MG tablet Take 10 mg by mouth at bedtime.     No current facility-administered medications for this visit.     OBJECTIVE:Middle-aged white woman in no acute distress  Vitals:   01/09/17 1031  BP: 127/81  Pulse: 93  Resp: 18  Temp: 97.9 F (36.6 C)     Body mass index is 38.37 kg/m.    ECOG FS:0 - Asymptomatic  Sclerae unicteric, pupils round and equal Oropharynx clear and moist No cervical or supraclavicular adenopathy Lungs no rales or rhonchi Heart regular rate and rhythm Abd soft, nontender, positive bowel sounds MSK no focal spinal tenderness, no upper extremity lymphedema Neuro: nonfocal, well oriented, appropriate affect Breasts: The right breast is status post lumpectomy and radiation. There is very minimal skin thickening. There are no findings to suggest disease recurrence. The erythema previously noted has almost completely resolved. The left breast is unremarkable. Both axillae are benign.  LAB RESULTS:  CMP     Component Value Date/Time   NA 142 08/19/2016 0857   K 4.1 08/19/2016 0857   CL 99 01/24/2013 0316   CO2 28 08/19/2016 0857   GLUCOSE 103 08/19/2016 0857   BUN 9.6 08/19/2016 0857   CREATININE 0.8 08/19/2016 0857   CALCIUM 10.4 08/19/2016 0857   PROT 7.0 08/19/2016 0857   ALBUMIN 3.9 08/19/2016 0857   AST 29 08/19/2016 0857   ALT 46 08/19/2016 0857   ALKPHOS 74 08/19/2016 0857   BILITOT 0.31 08/19/2016 0857   GFRNONAA 83 (L) 01/24/2013 0316   GFRAA >90 01/24/2013 0316    INo results found for: SPEP, UPEP  Lab Results  Component Value Date   WBC 6.6 01/09/2017   NEUTROABS 4.2 01/09/2017   HGB 14.5 01/09/2017   HCT 43.8 01/09/2017   MCV 86.4 01/09/2017   PLT 239 01/09/2017      Chemistry      Component Value Date/Time   NA 142 08/19/2016 0857   K 4.1 08/19/2016 0857   CL 99  01/24/2013 0316   CO2 28 08/19/2016 0857   BUN 9.6 08/19/2016 0857   CREATININE 0.8 08/19/2016  0857      Component Value Date/Time   CALCIUM 10.4 08/19/2016 0857   ALKPHOS 74 08/19/2016 0857   AST 29 08/19/2016 0857   ALT 46 08/19/2016 0857   BILITOT 0.31 08/19/2016 0857       No results found for: LABCA2  No components found for: LABCA125  No results for input(s): INR in the last 168 hours.  Urinalysis    Component Value Date/Time   COLORURINE YELLOW 01/24/2013 0327   APPEARANCEUR TURBID (A) 01/24/2013 0327   LABSPEC 1.021 01/24/2013 0327   PHURINE 7.5 01/24/2013 0327   GLUCOSEU NEGATIVE 01/24/2013 0327   HGBUR NEGATIVE 01/24/2013 0327   BILIRUBINUR NEGATIVE 01/24/2013 0327   KETONESUR 15 (A) 01/24/2013 0327   PROTEINUR NEGATIVE 01/24/2013 0327   UROBILINOGEN 0.2 01/24/2013 0327   NITRITE NEGATIVE 01/24/2013 0327   LEUKOCYTESUR NEGATIVE 01/24/2013 0327     STUDIES: No results found.  ELIGIBLE FOR AVAILABLE RESEARCH PROTOCOL: no  ASSESSMENT: 56 y.o. BRCA negative [place] woman status post right breast upper outer quadrant biopsy 04/05/2016 for a clinical T1b N0, stage IA invasive lobular breast cancer, grade 2 or 3, estrogen and progesterone receptor positive, HER-2 not amplified, with an MIB-1 of 20%  (1) genetics testing 04/23/2016 through the [place] offered by [facility] Hope Pigeon, MD)  found no deleterious mutations in  ATM, BARD1, BRCA1, BRCA2, BRIP1, CDH1, CHEK2, FANCC, MLH1, MSH2, MSH6, NBN, PALB2, PMS2, PTEN, RAD51C, RAD51D, TP53, and XRCC2.  This panel also includes deletion/duplication analysis (without sequencing) for one gene, EPCAM  (2) status post right lumpectomy and sentinel lymph node sampling 05/28/2016 for a pT1b pN0, stage IA invasive lobular breast cancer, grade 2, with negative margins  (3) Oncotype Dx score of 22 predicts a 10 year risk of recurrence outside the breast of 14% if the patient's only  systemic therapy is tamoxifen for 5 years. It predicts a benefit from chemotherapy in the 4% range.   Patient opted against adjuvant chemotherapy(a)   (4) adjuvant radiation 07/22/16-09/04/16 Site/dose:   1) Right breast/ 50.4 Gy in 28 fractions                         2) Right breast boost/ 10 Gy in 5 fractions  (5) started anastrozole September 29 2016   (a) bone density pending  PLAN: Dan is tolerating anastrozole remarkably well and the plan will be to continue that for a total of 5 years.  She will be due for repeat mammography in September. I am writing for a bone density to be done at the same time.  Today we reviewed some of the pain, discomfort, and changes that she can expect in the treated breast. These are all benign, and should not be cause for alarm if they do occur.  She is going to see me again in October, then again in April and October of next year at which point I will start seeing her on a once a year basis  She knows to call for any problems that may develop before then. Chauncey Cruel, MD   01/09/2017 10:37 AM Medical Oncology and Hematology [facility] [address] [place], [place] [address] Tel. [phone]    Fax. [phone]

## 2017-01-15 MED FILL — ROSUVASTATIN CALCIUM 10 MG: 10 | 60 days supply | Qty: 60 | Fill #6

## 2017-01-31 MED FILL — BUPROPION HCL XL 150 MG TAB: 150 | 90 days supply | Qty: 90 | Fill #1

## 2017-02-14 MED FILL — rOPINIRole HCL 0.25 MG TABS: 0.25 | 90 days supply | Qty: 90 | Fill #0

## 2017-02-18 DIAGNOSIS — R7303 Prediabetes: Secondary | ICD-10-CM | POA: Diagnosis not present

## 2017-02-18 DIAGNOSIS — G2581 Restless legs syndrome: Secondary | ICD-10-CM | POA: Diagnosis not present

## 2017-02-18 DIAGNOSIS — F419 Anxiety disorder, unspecified: Secondary | ICD-10-CM | POA: Diagnosis not present

## 2017-02-18 DIAGNOSIS — E785 Hyperlipidemia, unspecified: Secondary | ICD-10-CM | POA: Diagnosis not present

## 2017-02-18 DIAGNOSIS — Z6838 Body mass index (BMI) 38.0-38.9, adult: Secondary | ICD-10-CM | POA: Diagnosis not present

## 2017-03-12 NOTE — Addendum Note (Signed)
Addendum  created 03/12/17 1745 by Oleta Mouse, MD   Anesthesia Intra Blocks edited, Sign clinical note

## 2017-03-13 MED FILL — FLUoxetine HCL 10 MG CAPS: 10 | 90 days supply | Qty: 90 | Fill #0

## 2017-03-13 MED FILL — ROSUVASTATIN CALCIUM 10 MG: 10 | 90 days supply | Qty: 90 | Fill #0

## 2017-03-28 ENCOUNTER — Ambulatory Visit
Admission: RE | Admit: 2017-03-28 | Discharge: 2017-03-28 | Disposition: A | Discharge status: Home / Self Care | Payer: 59 | Source: Ambulatory Visit | Attending: Oncology | Admitting: Oncology

## 2017-03-28 DIAGNOSIS — C50411 Malignant neoplasm of upper-outer quadrant of right female breast: Secondary | ICD-10-CM

## 2017-03-28 DIAGNOSIS — E2839 Other primary ovarian failure: Secondary | ICD-10-CM

## 2017-03-28 DIAGNOSIS — Z780 Asymptomatic menopausal state: Secondary | ICD-10-CM | POA: Diagnosis not present

## 2017-03-28 DIAGNOSIS — Z170 Estrogen receptor positive status [ER+]: Principal | ICD-10-CM

## 2017-03-28 DIAGNOSIS — R922 Inconclusive mammogram: Secondary | ICD-10-CM | POA: Diagnosis not present

## 2017-03-31 ENCOUNTER — Telehealth: Payer: Self-pay

## 2017-03-31 NOTE — Telephone Encounter (Signed)
Spoke with patient regarding bone scan results.  Let her know that they are normal.  Patient voiced understanding.

## 2017-04-01 MED FILL — ESOMEPRAZOLE MAG DR 40 MG C: 40 | 90 days supply | Qty: 90 | Fill #0

## 2017-04-02 MED FILL — rOPINIRole HCL 0.5 MG TABS: 0.5 | 90 days supply | Qty: 90 | Fill #0

## 2017-04-08 DIAGNOSIS — Z85828 Personal history of other malignant neoplasm of skin: Secondary | ICD-10-CM | POA: Diagnosis not present

## 2017-04-08 DIAGNOSIS — D2271 Melanocytic nevi of right lower limb, including hip: Secondary | ICD-10-CM | POA: Diagnosis not present

## 2017-04-08 DIAGNOSIS — D2272 Melanocytic nevi of left lower limb, including hip: Secondary | ICD-10-CM | POA: Diagnosis not present

## 2017-04-08 DIAGNOSIS — D2262 Melanocytic nevi of left upper limb, including shoulder: Secondary | ICD-10-CM | POA: Diagnosis not present

## 2017-04-08 DIAGNOSIS — D1801 Hemangioma of skin and subcutaneous tissue: Secondary | ICD-10-CM | POA: Diagnosis not present

## 2017-04-08 DIAGNOSIS — D485 Neoplasm of uncertain behavior of skin: Secondary | ICD-10-CM | POA: Diagnosis not present

## 2017-04-08 DIAGNOSIS — D235 Other benign neoplasm of skin of trunk: Secondary | ICD-10-CM | POA: Diagnosis not present

## 2017-04-08 DIAGNOSIS — D2261 Melanocytic nevi of right upper limb, including shoulder: Secondary | ICD-10-CM | POA: Diagnosis not present

## 2017-04-08 DIAGNOSIS — D225 Melanocytic nevi of trunk: Secondary | ICD-10-CM | POA: Diagnosis not present

## 2017-04-11 MED FILL — PEG-3350 SOLUTION: 420 | 1 days supply | Qty: 4000 | Fill #0

## 2017-04-13 NOTE — Progress Notes (Signed)
[place]  Telephone:([phone]) [phone] Fax:(336) [phone]     ID: Candace [name] DOB: 1960/07/12  MR#: [id number]  JYN#:[id number]  Patient Care Team: [name], MD as PCP - General (Family Medicine) Candace [name], Candace Dad, MD as Consulting Physician (Oncology) [name], MD as Consulting Physician (General Surgery) Leo Grosser Seymour Bars, MD as Consulting Physician (Obstetrics and Gynecology) Ronald Lobo, MD as Consulting Physician (Gastroenterology) Danella Sensing, MD as Consulting Physician (Dermatology) Delice Bison, Charlestine Massed, NP as Nurse Practitioner (Hematology and Oncology)  OTHER MD:  CHIEF COMPLAINT: Estrogen receptor positive breast cancer   CURRENT TREATMENT: Anastrozole   BREAST CANCER HISTORY: From the original intake note:  Candace [name] had routine screening mammography with tomography at the [facility] 03/29/2016. On 04/04/2016 she underwent right diagnostic mammography and tomography with right breast ultrasonography. The breast density was category B. In the upper outer quadrant of the breast there was a mass measuring 0.7 cm, with irregular margins. By ultrasonography this was located at the 10:00 position 9 cm from the nipple measuring 0.6 cm. Right axillary ultrasound was negative.  Biopsy of the right breast mass in question 04/05/2016 showed ([id number]) and invasive lobular carcinoma, grade 2 or 3, E-cadherin negative, estrogen receptor 90% positive, progesterone receptor 5% positive, both with strong staining intensity, with an MIB-1 of 20%, and no HER-2 amplification, the signals ratio being 0.82 and the number per cell 1.60.  Bilateral breast MRI was obtained 04/23/2016. This confirmed a 0.6 cm nodule in the upper-outer quadrant of the right breast with no additional sites of concern in either breast and no abnormal appearing lymph nodes.  Her subsequent history is as detailed below.  INTERVAL HISTORY: Candace [name] returns today for  follow-up and treatment of her estrogen receptor positive breast cancer. She continues on anastrozole, with good tolerance overall. She notes that she feels hot sometimes and often "breaks into a sweat" with increased anxiety. This only occurs twice a week. She denies an increase in vaginal dryness at this time. She recently had a mammogram and notes that her breast is healing at this time.  Since her last visit to the office, she underwent a Bone Density Scan at [facility] on 03/28/2017 with results that showed: T-score of 0.8. Subsequently, she underwent diagnostic bilateral mammography with tomography at [facility] on 03/28/2017.  REVIEW OF SYSTEMS: Candace [name] reports that she exercises at [employer] approximately 4 times a week. She denies unusual headaches, visual changes, nausea, vomiting, or dizziness. There has been no unusual cough, phlegm production, or pleurisy. This been no change in bowel or bladder habits. She denies unexplained fatigue or unexplained weight loss, bleeding, rash, or fever. A detailed review of systems was otherwise negative.     PAST MEDICAL HISTORY: Past Medical History:  Diagnosis Date  . Abnormal Pap smear   . Anovulation   . Breast cancer ([place]) 04/05/2016   right breast  . Cancer ([place]) 2017   right breast cancer  . Complication of anesthesia    slow to wake up  . Depression   . Endometrial hyperplasia   . Endometrial polyp    h/o  . Epidermal cyst    h/o  . GERD (gastroesophageal reflux disease)   . H/O hemorrhoids   . H/O menorrhagia   . High risk HPV infection   . Insomnia   . Oligomenorrhea   . Restless legs syndrome   . Seasonal allergies     PAST SURGICAL HISTORY: Past Surgical History:  Procedure Laterality  Date  . BREAST LUMPECTOMY Right 05/2016   radiation  . BREAST LUMPECTOMY WITH RADIOACTIVE SEED AND SENTINEL LYMPH NODE BIOPSY Right 05/28/2016   Procedure: RADIOACTIVE SEED GUIDED RIGHT BREAST LUMPECTOMY WITH  RIGHT  AXILLARY SENTINEL LYMPH NODE BIOPSY;  Surgeon: [name], MD;  Location: [facility];  Service: General;  Laterality: Right;  . CARPAL TUNNEL RELEASE  03/26/2011   right hand  . CARPAL TUNNEL RELEASE  05/28/2011   Procedure: CARPAL TUNNEL RELEASE;  Surgeon: [name];  Location: [facility];  Service: Orthopedics;  Laterality: Left;  . CHOLECYSTECTOMY N/A 01/24/2013   Procedure: LAPAROSCOPIC CHOLECYSTECTOMY WITH INTRAOPERATIVE CHOLANGIOGRAM;  Surgeon: [name], MD;  Location: [facility];  Service: General;  Laterality: N/A;  . COLONOSCOPY    . HYSTEROSCOPY    . POLYPECTOMY    . TONSILLECTOMY  1967   as child  . trigger fingers  8 yrs. ago   both done months apart  . uterine ablation  06/04/2010  . uterine ablation      FAMILY HISTORY Family History  Problem Relation Age of Onset  . Heart disease Mother   . Hypertension Mother   . Stroke Mother   . Dementia Mother        vascular dementia  . Pulmonary fibrosis Father        d. 32  . Depression Father   . Heart attack Maternal Grandmother        d. [phone]  . Heart disease Maternal Grandmother   . Heart disease Maternal Grandfather        d. 60-63  . Leukemia Paternal Grandmother        d. late 9s  . Breast cancer Sister 47       IDC s/p mastectomy and chemo  Both of the patient's parents were only children. Her father died at age 12 from idiopathic pulmonary fibrosis. The patient's mother died at the age of 75 in the setting of dementia. The patient has one brother and one sister. Her sister was diagnosed at age 35 with breast cancer (a T1b invasive ductal carcinoma just behind the nipple, requiring mastectomy. Her Oncotype was 24, and she received chemotherapy 3. She is now doing well).  GYNECOLOGIC HISTORY:  No LMP recorded. Patient has had an ablation. Menarche age 46, first live birth age ? She is GX P2. She stopped having periods December 2012 when she underwent endometrial ablation. 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 She is divorced and lives at home with her daughters   Candace [name]            and Candace [name] (age 20 and 66 as of November 2017) and 2 dogs. The patient's was brought up as an [place].    ADVANCED DIRECTIVES: In place. The patient's sister Candace [name] is her healthcare part of attorney   HEALTH MAINTENANCE: Social History  Substance Use Topics  . Smoking status: Never Smoker  . Smokeless tobacco: Never Used  . Alcohol use No     Comment: none since 1996     Colonoscopy:  PAP:  Bone density: To be scheduled   No Known Allergies  Current Outpatient Prescriptions  Medication Sig Dispense Refill  . anastrozole (ARIMIDEX) 1 MG tablet Take 1 tablet (1 mg total) by mouth daily. 90 tablet 4  . buPROPion (WELLBUTRIN XL) 150 MG 24 hr tablet  Take 150 mg by mouth daily.    . cetirizine (ZYRTEC) 10 MG tablet Take 10 mg by mouth daily. [facility]    . Cholecalciferol (VITAMIN D) 2000 units tablet Take 2,000 Units by mouth daily.    [name] esomeprazole (NEXIUM) 40 MG capsule Take 40 mg by mouth every 36 (thirty-six) hours.    [name] FLUoxetine (PROZAC) 10 MG capsule Take 10 mg by mouth daily with lunch.     . ibuprofen (ADVIL,MOTRIN) 200 MG tablet Take 400-800 mg by mouth every 8 (eight) hours as needed (for pain.).    [name] Multiple Vitamin (MULTIVITAMIN WITH MINERALS) TABS tablet Take 1 tablet by mouth daily.    . polyvinyl alcohol (LIQUIFILM TEARS) 1.4 % ophthalmic solution Place 1 drop into both eyes 3 (three) times daily as needed for dry eyes.    [name] rOPINIRole (REQUIP) 0.25 MG tablet Take 0.25 mg by mouth daily at 8 pm.    . rosuvastatin (CRESTOR) 10 MG tablet Take 10 mg by mouth at bedtime.     No current facility-administered medications for this visit.     OBJECTIVE:Middle-aged white woman Who appears  well  Vitals:   04/16/17 1430  BP: (!) 143/78  Pulse: 93  Resp: (!) 98  Temp: 98.3 F (36.8 C)  SpO2: 98%     Body mass index is 38.86 kg/m.    ECOG FS:0 - Asymptomatic  Sclerae unicteric, EOMs intact Oropharynx clear and moist No cervical or supraclavicular adenopathy Lungs no rales or rhonchi Heart regular rate and rhythm Abd soft, nontender, positive bowel sounds MSK no focal spinal tenderness, no upper extremity lymphedema Neuro: nonfocal, well oriented, appropriate affect Breasts: The right breast is status post lumpectomy followed by radiation. There is no evidence of local recurrence. There is loss of sensation in the lateral aspect of the breast. The left breast is benign. Both axillae are benign.  LAB RESULTS:  CMP     Component Value Date/Time   NA 139 01/09/2017 1017   K 4.3 01/09/2017 1017   CL 99 01/24/2013 0316   CO2 27 01/09/2017 1017   GLUCOSE 101 01/09/2017 1017   BUN 13.8 01/09/2017 1017   CREATININE 0.9 01/09/2017 1017   CALCIUM 10.3 01/09/2017 1017   PROT 7.1 01/09/2017 1017   ALBUMIN 4.0 01/09/2017 1017   AST 26 01/09/2017 1017   ALT 34 01/09/2017 1017   ALKPHOS 83 01/09/2017 1017   BILITOT 0.40 01/09/2017 1017   GFRNONAA 83 (L) 01/24/2013 0316   GFRAA >90 01/24/2013 0316    INo results found for: SPEP, UPEP  Lab Results  Component Value Date   WBC 5.7 04/16/2017   NEUTROABS 3.0 04/16/2017   HGB 13.7 04/16/2017   HCT 41.2 04/16/2017   MCV 84.4 04/16/2017   PLT 259 04/16/2017      Chemistry      Component Value Date/Time   NA 139 01/09/2017 1017   K 4.3 01/09/2017 1017   CL 99 01/24/2013 0316   CO2 27 01/09/2017 1017   BUN 13.8 01/09/2017 1017   CREATININE 0.9 01/09/2017 1017      Component Value Date/Time   CALCIUM 10.3 01/09/2017 1017   ALKPHOS 83 01/09/2017 1017   AST 26 01/09/2017 1017   ALT 34 01/09/2017 1017   BILITOT 0.40 01/09/2017 1017       No results found for: LABCA2  No components found for:  LABCA125  No results for input(s): INR in the last 168 hours.  Urinalysis  Component Value Date/Time   COLORURINE YELLOW 01/24/2013 0327   APPEARANCEUR TURBID (A) 01/24/2013 0327   LABSPEC 1.021 01/24/2013 0327   PHURINE 7.5 01/24/2013 0327   GLUCOSEU NEGATIVE 01/24/2013 0327   HGBUR NEGATIVE 01/24/2013 0327   BILIRUBINUR NEGATIVE 01/24/2013 0327   KETONESUR 15 (A) 01/24/2013 0327   PROTEINUR NEGATIVE 01/24/2013 0327   UROBILINOGEN 0.2 01/24/2013 0327   NITRITE NEGATIVE 01/24/2013 0327   LEUKOCYTESUR NEGATIVE 01/24/2013 0327     STUDIES: Dg Bone Density  Result Date: 03/28/2017 EXAM: DUAL X-RAY ABSORPTIOMETRY (DXA) FOR BONE MINERAL DENSITY IMPRESSION: Referring Physician:  Chauncey Cruel PATIENT: Name: Candace [name], Candace [name] Patient ID: [id number] Birth Date: Sep 19, 1960 Height: 67.0 in. Sex: Female Measured: 03/28/2017 Weight: 242.9 lbs. Indications: Anastrazole, Breast Cancer History, Caucasian, Estrogen Deficient, Postmenopausal Fractures: None Treatments: Hormone Therapy For Cancer, Vitamin D (E933.5) ASSESSMENT: The BMD measured at Femur Neck Right is 1.150 g/cm2 with a T-score of 0.8. This patient is considered normal according to [facility]) criteria. Site Region Measured Date Measured Age YA BMD Significant CHANGE T-score DualFemur Neck Right 03/28/2017    55.9         0.8     1.150 g/cm2 AP Spine  L1-L4      3/28/2017    55.9         1.4     1.364 g/cm2 World Health Organization [facility]) criteria for post-menopausal, Caucasian Women: Normal       T-score at or above -1 SD Osteopenia   T-score between -1 and -2.5 SD Osteoporosis T-score at or below -2.5 SD RECOMMENDATION: [place] recommends that FDA-approved medical therapies be considered in postmenopausal women and men age 39 or older with a: 1. Hip or vertebral (clinical or morphometric) fracture. 2. T-score of <-2.5 at the spine or hip. 3. Ten-year fracture probability by FRAX of 3% or greater  for hip fracture or 20% or greater for major osteoporotic fracture. All treatment decisions require clinical judgment and consideration of individual patient factors, including patient preferences, co-morbidities, previous drug use, risk factors not captured in the FRAX model (e.g. falls, vitamin D deficiency, increased bone turnover, interval significant decline in bone density) and possible under - or over-estimation of fracture risk by FRAX. All patients should ensure an adequate intake of dietary calcium (1200 mg/d) and vitamin D (800 IU daily) unless contraindicated. FOLLOW-UP: People with diagnosed cases of osteoporosis or at high risk for fracture should have regular bone mineral density tests. For patients eligible for Medicare, routine testing is allowed once every 2 years. The testing frequency can be increased to one year for patients who have rapidly progressing disease, those who are receiving or discontinuing medical therapy to restore bone mass, or have additional risk factors. I have reviewed this report, and agree with the above findings. [facility] Electronically Signed   By: Nolon Nations M.D.   On: 03/28/2017 09:08   Mm Diag Breast Tomo Bilateral  Result Date: 03/28/2017 CLINICAL DATA:  Patient status post right breast lumpectomy. EXAM: 2D DIGITAL DIAGNOSTIC BILATERAL MAMMOGRAM WITH CAD AND ADJUNCT TOMO COMPARISON:  Previous exam(s). ACR Breast Density Category c: The breast tissue is heterogeneously dense, which may obscure small masses. FINDINGS: Interval right lumpectomy changes. No concerning masses, calcifications or nonsurgical distortion identified within either breast. Mammographic images were processed with CAD. IMPRESSION: No mammographic evidence for malignancy. Interval lumpectomy changes right breast. RECOMMENDATION: Bilateral diagnostic mammography 1 year. I have discussed the findings and recommendations with the patient.  Results were also provided in writing at  the conclusion of the visit. If applicable, a reminder letter will be sent to the patient regarding the next appointment. BI-RADS CATEGORY  2: Benign. Electronically Signed   By: Lovey Newcomer M.D.   On: 03/28/2017 09:37    ELIGIBLE FOR AVAILABLE RESEARCH PROTOCOL: no  ASSESSMENT: 56 y.o. BRCA negative [place] woman status post right breast upper outer quadrant biopsy 04/05/2016 for a clinical T1b N0, stage IA invasive lobular breast cancer, grade 2 or 3, estrogen and progesterone receptor positive, HER-2 not amplified, with an MIB-1 of 20%  (1) genetics testing 04/23/2016 through the [facility] offered by [facility] Candace Pigeon, MD)  found no deleterious mutations in  ATM, BARD1, BRCA1, BRCA2, BRIP1, CDH1, CHEK2, FANCC, MLH1, MSH2, MSH6, NBN, PALB2, PMS2, PTEN, RAD51C, RAD51D, TP53, and XRCC2.  This panel also includes deletion/duplication analysis (without sequencing) for one gene, EPCAM  (2) status post right lumpectomy and sentinel lymph node sampling 05/28/2016 for a pT1b pN0, stage IA invasive lobular breast cancer, grade 2, with negative margins  (3) Oncotype Dx score of 22 predicts a 10 year risk of recurrence outside the breast of 14% if the patient's only systemic therapy is tamoxifen for 5 years. It predicts a benefit from chemotherapy in the 4% range.   Patient opted against adjuvant chemotherapy(a)   (4) adjuvant radiation 07/22/16-09/04/16 Site/dose:   1) Right breast/ 50.4 Gy in 28 fractions                         2) Right breast boost/ 10 Gy in 5 fractions  (5) started anastrozole September 29 2016   (a) bone density 03/28/2017 at the [facility] Center showed a T score of -0.8, normal   PLAN: Nikko is now just about a year out from definitive surgery for her breast cancer with no evidence of disease recurrence. This is favorable.  She is tolerating tamoxifen with no significant side effects. The plan will be to continue it for a total of 5  years.  She has a normal baseline bone density. We will repeat that in 2 years. In the meantime I have commended her exercise program, which is excellent. She also plans to start winter tenderness. She did the 5K with HER-2 daughters and enjoyed it immensely  I will see her again in 6 months and then again a year from now after which point we will start seeing her on a once a year basis  She knows to call for any other issues that may develop before the next visit.  Candace [name], Candace Dad, MD  04/16/17 2:49 PM Medical Oncology and Hematology [facility] [address] [place], [place] [address] Tel. [phone]    Fax. [phone]  This document serves as a record of services personally performed by Lurline Del, MD. It was created on her behalf by Steva Colder, a trained medical scribe. The creation of this record is based on the scribe's personal observations and the provider's statements to them. This document has been checked and approved by the attending provider.

## 2017-04-15 ENCOUNTER — Other Ambulatory Visit: Payer: Self-pay

## 2017-04-15 DIAGNOSIS — C50411 Malignant neoplasm of upper-outer quadrant of right female breast: Secondary | ICD-10-CM

## 2017-04-15 DIAGNOSIS — Z170 Estrogen receptor positive status [ER+]: Principal | ICD-10-CM

## 2017-04-16 ENCOUNTER — Ambulatory Visit (HOSPITAL_BASED_OUTPATIENT_CLINIC_OR_DEPARTMENT_OTHER): Payer: 59 | Admitting: Oncology

## 2017-04-16 ENCOUNTER — Other Ambulatory Visit (HOSPITAL_BASED_OUTPATIENT_CLINIC_OR_DEPARTMENT_OTHER): Payer: 59

## 2017-04-16 VITALS — BP 143/78 | HR 93 | Temp 98.3°F | Resp 98 | Ht 67.0 in | Wt 248.1 lb

## 2017-04-16 DIAGNOSIS — C50411 Malignant neoplasm of upper-outer quadrant of right female breast: Secondary | ICD-10-CM

## 2017-04-16 DIAGNOSIS — Z79811 Long term (current) use of aromatase inhibitors: Secondary | ICD-10-CM | POA: Diagnosis not present

## 2017-04-16 DIAGNOSIS — Z170 Estrogen receptor positive status [ER+]: Principal | ICD-10-CM

## 2017-04-16 LAB — CBC WITH DIFFERENTIAL/PLATELET
BASO%: 0.6 % (ref 0.0–2.0)
BASOS ABS: 0 10*3/uL (ref 0.0–0.1)
EOS%: 2.5 % (ref 0.0–7.0)
Eosinophils Absolute: 0.1 10*3/uL (ref 0.0–0.5)
HEMATOCRIT: 41.2 % (ref 34.8–46.6)
HEMOGLOBIN: 13.7 g/dL (ref 11.6–15.9)
LYMPH#: 1.9 10*3/uL (ref 0.9–3.3)
LYMPH%: 33.8 % (ref 14.0–49.7)
MCH: 28.1 pg (ref 25.1–34.0)
MCHC: 33.3 g/dL (ref 31.5–36.0)
MCV: 84.4 fL (ref 79.5–101.0)
MONO#: 0.6 10*3/uL (ref 0.1–0.9)
MONO%: 10.3 % (ref 0.0–14.0)
NEUT%: 52.8 % (ref 38.4–76.8)
NEUTROS ABS: 3 10*3/uL (ref 1.5–6.5)
Platelets: 259 10*3/uL (ref 145–400)
RBC: 4.88 10*6/uL (ref 3.70–5.45)
RDW: 13.2 % (ref 11.2–14.5)
WBC: 5.7 10*3/uL (ref 3.9–10.3)

## 2017-04-16 LAB — COMPREHENSIVE METABOLIC PANEL
ALBUMIN: 4 g/dL (ref 3.5–5.0)
ALK PHOS: 74 U/L (ref 40–150)
ALT: 34 U/L (ref 0–55)
AST: 29 U/L (ref 5–34)
Anion Gap: 8 mEq/L (ref 3–11)
BILIRUBIN TOTAL: 0.4 mg/dL (ref 0.20–1.20)
BUN: 12.4 mg/dL (ref 7.0–26.0)
CALCIUM: 9.6 mg/dL (ref 8.4–10.4)
CO2: 25 mEq/L (ref 22–29)
CREATININE: 0.9 mg/dL (ref 0.6–1.1)
Chloride: 108 mEq/L (ref 98–109)
EGFR: >60 mL/min/{1.73_m2} (ref >60)
GLUCOSE: 134 mg/dL (ref 70–140)
Potassium: 3.8 mEq/L (ref 3.5–5.1)
Sodium: 141 mEq/L (ref 136–145)
TOTAL PROTEIN: 7 g/dL (ref 6.4–8.3)

## 2017-04-24 MED FILL — ANASTROZOLE 1 MG TABLET: 1 | 90 days supply | Qty: 90 | Fill #2

## 2017-04-25 DIAGNOSIS — Z86010 Personal history of colonic polyps: Secondary | ICD-10-CM | POA: Diagnosis not present

## 2017-04-25 DIAGNOSIS — K644 Residual hemorrhoidal skin tags: Secondary | ICD-10-CM | POA: Diagnosis not present

## 2017-04-25 DIAGNOSIS — D126 Benign neoplasm of colon, unspecified: Secondary | ICD-10-CM | POA: Diagnosis not present

## 2017-05-01 DIAGNOSIS — H5213 Myopia, bilateral: Secondary | ICD-10-CM | POA: Diagnosis not present

## 2017-05-29 MED FILL — BUPROPION HCL XL 150 MG TAB: 150 | 90 days supply | Qty: 90 | Fill #0

## 2017-06-12 MED FILL — FLUoxetine HCL 10 MG CAPS: 10 | 90 days supply | Qty: 90 | Fill #1

## 2017-06-12 MED FILL — ROSUVASTATIN CALCIUM 10 MG: 10 | 90 days supply | Qty: 90 | Fill #1

## 2017-06-12 MED FILL — ESOMEPRAZOLE MAG DR 40 MG C: 40 | 90 days supply | Qty: 90 | Fill #1

## 2017-06-13 MED FILL — rOPINIRole HCL 0.5 MG TABS: 0.5 | 90 days supply | Qty: 90 | Fill #1

## 2017-08-06 MED FILL — ANASTROZOLE 1 MG TABLET: 1 | 90 days supply | Qty: 90 | Fill #3

## 2017-09-09 MED FILL — buPROPion HCL ER (XL) 150 M: 150 | 90 days supply | Qty: 90 | Fill #1

## 2017-09-09 MED FILL — ROSUVASTATIN CALCIUM 10 MG: 10 | 90 days supply | Qty: 90 | Fill #0

## 2017-09-11 ENCOUNTER — Encounter (INDEPENDENT_AMBULATORY_CARE_PROVIDER_SITE_OTHER): Payer: Self-pay

## 2017-09-16 DIAGNOSIS — E785 Hyperlipidemia, unspecified: Secondary | ICD-10-CM | POA: Diagnosis not present

## 2017-09-16 DIAGNOSIS — F419 Anxiety disorder, unspecified: Secondary | ICD-10-CM | POA: Diagnosis not present

## 2017-09-16 DIAGNOSIS — C50411 Malignant neoplasm of upper-outer quadrant of right female breast: Secondary | ICD-10-CM | POA: Diagnosis not present

## 2017-09-16 DIAGNOSIS — F3341 Major depressive disorder, recurrent, in partial remission: Secondary | ICD-10-CM | POA: Diagnosis not present

## 2017-09-16 DIAGNOSIS — Z0000 Encounter for general adult medical examination without abnormal findings: Secondary | ICD-10-CM | POA: Diagnosis not present

## 2017-09-16 DIAGNOSIS — G2581 Restless legs syndrome: Secondary | ICD-10-CM | POA: Diagnosis not present

## 2017-09-16 DIAGNOSIS — R7303 Prediabetes: Secondary | ICD-10-CM | POA: Diagnosis not present

## 2017-09-16 DIAGNOSIS — E559 Vitamin D deficiency, unspecified: Secondary | ICD-10-CM | POA: Diagnosis not present

## 2017-09-16 DIAGNOSIS — Z23 Encounter for immunization: Secondary | ICD-10-CM | POA: Diagnosis not present

## 2017-09-17 ENCOUNTER — Encounter (INDEPENDENT_AMBULATORY_CARE_PROVIDER_SITE_OTHER): Payer: Self-pay | Admitting: Family Medicine

## 2017-09-17 ENCOUNTER — Ambulatory Visit (INDEPENDENT_AMBULATORY_CARE_PROVIDER_SITE_OTHER): Payer: 59 | Admitting: Family Medicine

## 2017-09-17 VITALS — BP 151/82 | HR 80 | Ht 67.0 in | Wt 249.0 lb

## 2017-09-17 DIAGNOSIS — E7849 Other hyperlipidemia: Secondary | ICD-10-CM

## 2017-09-17 DIAGNOSIS — Z6839 Body mass index (BMI) 39.0-39.9, adult: Secondary | ICD-10-CM | POA: Diagnosis not present

## 2017-09-17 DIAGNOSIS — Z1331 Encounter for screening for depression: Secondary | ICD-10-CM | POA: Diagnosis not present

## 2017-09-17 DIAGNOSIS — R0602 Shortness of breath: Secondary | ICD-10-CM | POA: Diagnosis not present

## 2017-09-17 DIAGNOSIS — R5383 Other fatigue: Secondary | ICD-10-CM | POA: Diagnosis not present

## 2017-09-17 DIAGNOSIS — Z0289 Encounter for other administrative examinations: Secondary | ICD-10-CM

## 2017-09-17 DIAGNOSIS — Z9189 Other specified personal risk factors, not elsewhere classified: Secondary | ICD-10-CM | POA: Diagnosis not present

## 2017-09-17 DIAGNOSIS — R739 Hyperglycemia, unspecified: Secondary | ICD-10-CM

## 2017-09-18 LAB — HEMOGLOBIN A1C
ESTIMATED AVERAGE GLUCOSE: 131 mg/dL
HEMOGLOBIN A1C: 6.2 % — AB (ref 4.8–5.6)

## 2017-09-18 LAB — FOLATE: Folate: >20 ng/mL (ref >3.0)

## 2017-09-18 LAB — INSULIN, RANDOM: INSULIN: 24.2 u[IU]/mL (ref 2.6–24.9)

## 2017-09-18 LAB — COMPREHENSIVE METABOLIC PANEL
A/G RATIO: 1.9 (ref 1.2–2.2)
ALK PHOS: 91 IU/L (ref 39–117)
ALT: 45 IU/L — ABNORMAL HIGH (ref 0–32)
AST: 35 IU/L (ref 0–40)
Albumin: 4.5 g/dL (ref 3.5–5.5)
BUN/Creatinine Ratio: 14 (ref 9–23)
BUN: 11 mg/dL (ref 6–24)
Bilirubin Total: 0.3 mg/dL (ref 0.0–1.2)
CHLORIDE: 102 mmol/L (ref 96–106)
CO2: 25 mmol/L (ref 20–29)
Calcium: 10 mg/dL (ref 8.7–10.2)
Creatinine, Ser: 0.76 mg/dL (ref 0.57–1.00)
GFR calc Af Amer: 101 mL/min/{1.73_m2} (ref >59)
GFR calc non Af Amer: 88 mL/min/{1.73_m2} (ref >59)
Globulin, Total: 2.4 g/dL (ref 1.5–4.5)
Glucose: 100 mg/dL — ABNORMAL HIGH (ref 65–99)
POTASSIUM: 4.4 mmol/L (ref 3.5–5.2)
SODIUM: 142 mmol/L (ref 134–144)
Total Protein: 6.9 g/dL (ref 6.0–8.5)

## 2017-09-18 LAB — T4, FREE: FREE T4: 1.05 ng/dL (ref 0.82–1.77)

## 2017-09-18 LAB — VITAMIN B12: VITAMIN B 12: 826 pg/mL (ref 232–1245)

## 2017-09-18 LAB — TSH: TSH: 2.39 u[IU]/mL (ref 0.450–4.500)

## 2017-09-18 LAB — T3: T3, Total: 120 ng/dL (ref 71–180)

## 2017-09-22 NOTE — Progress Notes (Signed)
. .  Office: [phone]  /  Fax: [phone]   HPI:   Chief Complaint: OBESITY  Candace [name] (MR# [id number]) is a 57 y.o. female who presents on 09/17/2017 for obesity evaluation and treatment. Current BMI is Body mass index is 39 kg/m.Candace [name] has struggled with obesity for years and has been unsuccessful in either losing weight or maintaining long term weight loss. Candace [name] attended our information session and states she is currently in the action stage of change and ready to dedicate time achieving and maintaining a healthier weight.   Candace [name] was told by a co-worker about our clinic. She was very impacted by our information session. She has a history of a very active lifestyle.  Candace [name] states her family eats meals together she thinks her family will eat healthier with  her her desired weight loss is 89 lbs she started gaining weight within the last 10 years her heaviest weight ever was 250 lbs she has significant food cravings issues  she snacks frequently in the evenings she is frequently drinking liquids with calories she frequently makes poor food choices she frequently eats larger portions than normal  she struggles with emotional eating    Fatigue Candace [name] feels her energy is lower than it should be. This has worsened with weight gain and has not worsened recently. Candace [name] admits to daytime somnolence and  admits to waking up still tired. Patient is at risk for obstructive sleep apnea. Patent has a history of symptoms of daytime fatigue. Patient generally gets 4 hours of sleep per night, and states they generally have difficulty falling back asleep if awakened. Snoring is present. Apneic episodes are not present. Epworth Sleepiness Score is 12.  Dyspnea on exertion Candace [name] notes increasing shortness of breath with exercising and seems to be worsening over time with weight gain. She notes getting out of breath sooner with activity than she used to. This has not gotten worse  recently. EKG-RBBB. Candace [name] denies orthopnea.  Hyperlipidemia Candace [name] has hyperlipidemia and has been trying to improve her cholesterol levels with intensive lifestyle modification including a low saturated fat diet, exercise and weight loss. She denies any chest pain, claudication or myalgias.  At risk for cardiovascular disease Candace [name] is at a higher than average risk for cardiovascular disease due to obesity and hyperlipidemia. She currently denies any chest pain.  Hyperglycemia Candace [name] has a history of some elevated blood glucose readings without a diagnosis of diabetes. She is on statin and she denies polyphagia.  Depression Screen Candace [name] (modified PHQ-9) score was  Depression screen PHQ 2/9 09/17/2017  Decreased Interest 3  Down, Depressed, Hopeless 3  PHQ - 2 Score 6  Altered sleeping 2  Tired, decreased energy 3  Change in appetite 3  Feeling bad or failure about yourself  3  Trouble concentrating 1  Moving slowly or fidgety/restless 0  Suicidal thoughts 0  PHQ-9 Score 18  Difficult doing work/chores Very difficult    ALLERGIES: No Known Allergies  MEDICATIONS: Current Outpatient Medications on File Prior to Visit  Medication Sig Dispense Refill  . anastrozole (ARIMIDEX) 1 MG tablet Take 1 tablet (1 mg total) by mouth daily. 90 tablet 4  . buPROPion (WELLBUTRIN XL) 300 MG 24 hr tablet Take 150 mg by mouth daily.    . cetirizine (ZYRTEC) 10 MG tablet Take 10 mg by mouth daily. [facility]    . cholecalciferol (VITAMIN D) 1000 units tablet Take 1,000 Units by mouth daily.     [name]  esomeprazole (NEXIUM) 40 MG capsule Take 40 mg by mouth every 36 (thirty-six) hours.    [name] FLUoxetine (PROZAC) 10 MG capsule Take 10 mg by mouth daily with lunch.     . Multiple Vitamin (MULTIVITAMIN WITH MINERALS) TABS tablet Take 1 tablet by mouth daily.    [name] rOPINIRole (REQUIP) 0.25 MG tablet Take 0.5 mg by mouth daily at 8 pm.    . rosuvastatin (CRESTOR) 10 MG  tablet Take 10 mg by mouth at bedtime.     No current facility-administered medications on file prior to visit.     PAST MEDICAL HISTORY: Past Medical History:  Diagnosis Date  . Abnormal Pap smear   . Alcohol abuse   . Anovulation   . Anxiety   . Breast cancer ([place]) 04/05/2016   right breast  . Cancer ([place]) 2017   right breast cancer  . Complication of anesthesia    slow to wake up  . Depression   . Endometrial hyperplasia   . Endometrial polyp    h/o  . Epidermal cyst    h/o  . Gallbladder problem   . GERD (gastroesophageal reflux disease)   . H/O hemorrhoids   . H/O menorrhagia   . High risk HPV infection   . Hyperlipidemia   . Insomnia   . Obesity   . Oligomenorrhea   . Prediabetes   . Restless legs syndrome   . Seasonal allergies   . Vitamin D deficiency     PAST SURGICAL HISTORY: Past Surgical History:  Procedure Laterality Date  . BREAST LUMPECTOMY Right 05/2016   radiation  . BREAST LUMPECTOMY WITH RADIOACTIVE SEED AND SENTINEL LYMPH NODE BIOPSY Right 05/28/2016   Procedure: RADIOACTIVE SEED GUIDED RIGHT BREAST LUMPECTOMY WITH  RIGHT AXILLARY SENTINEL LYMPH NODE BIOPSY;  Surgeon: [name], MD;  Location: [facility];  Service: General;  Laterality: Right;  . CARPAL TUNNEL RELEASE  03/26/2011   right hand  . CARPAL TUNNEL RELEASE  05/28/2011   Procedure: CARPAL TUNNEL RELEASE;  Surgeon: [name];  Location: [facility];  Service: Orthopedics;  Laterality: Left;  . CHOLECYSTECTOMY N/A 01/24/2013   Procedure: LAPAROSCOPIC CHOLECYSTECTOMY WITH INTRAOPERATIVE CHOLANGIOGRAM;  Surgeon: [name], MD;  Location[place];  Service: General;  Laterality: N/A;  . COLONOSCOPY    . HYSTEROSCOPY    . POLYPECTOMY    . TONSILLECTOMY  1967   as child  . trigger fingers  8 yrs. ago   both done months apart  . uterine ablation  06/04/2010  . uterine ablation      SOCIAL HISTORY: Social History   Tobacco Use  . Smoking status: Never Smoker  .  Smokeless tobacco: Never Used  Substance Use Topics  . Alcohol use: No    Comment: none since 1996  . Drug use: No    FAMILY HISTORY: Family History  Problem Relation Age of Onset  . Heart disease Mother   . Hypertension Mother   . Stroke Mother   . Dementia Mother        vascular dementia  . Hyperlipidemia Mother   . Depression Mother   . Anxiety disorder Mother   . Alcohol abuse Mother   . Obesity Mother   . Pulmonary fibrosis Father        d. 69  . Depression Father   . Anxiety disorder Father   . Alcohol abuse Father   . Heart attack Maternal Grandmother        d. [phone]  .  Heart disease Maternal Grandmother   . Heart disease Maternal Grandfather        d. 60-63  . Leukemia Paternal Grandmother        d. late 37s  . Breast cancer Sister 44       IDC s/p mastectomy and chemo    ROS: Review of Systems  Constitutional: Positive for malaise/fatigue. Negative for weight loss.       + Trouble sleeping  HENT:       + Decreased hearing + Hay fever  Eyes:       + Wear glasses or contacts  Respiratory: Positive for shortness of breath (with exertion).   Cardiovascular: Negative for chest pain, orthopnea and claudication.  Gastrointestinal: Positive for heartburn.  Musculoskeletal: Negative for myalgias.       + Muscle or joint pain + Muscle stiffness  Endo/Heme/Allergies:       Negative polyphagia  Psychiatric/Behavioral: Positive for depression. Negative for suicidal ideas.       + Stress    PHYSICAL EXAM: Blood pressure (!) 151/82, pulse 80, height 5\' 7"  (1.702 m), weight 249 lb (112.9 kg), SpO2 97 %. Body mass index is 39 kg/m. Physical Exam  Constitutional: She is oriented to person, place, and time. She appears well-developed and well-nourished.  HENT:  Head: Normocephalic and atraumatic.  Nose: Nose normal.  Eyes: EOM are normal. No scleral icterus.  Neck: Normal range of motion. Neck supple. No thyromegaly present.  Cardiovascular: Normal rate and  regular rhythm.  Pulmonary/Chest: Effort normal. No respiratory distress.  Abdominal: Soft. There is no tenderness.  + Obesity  Musculoskeletal:  Range of Motion normal in all 4 extremities Trace edema noted in bilateral lower extremities  Neurological: She is alert and oriented to person, place, and time. Coordination normal.  Skin: Skin is warm and dry.  Psychiatric: She has a normal [name] and affect. Her behavior is normal.  Vitals reviewed.   RECENT LABS AND TESTS: BMET    Component Value Date/Time   NA 142 09/17/2017 1140   NA 141 04/16/2017 1409   K 4.4 09/17/2017 1140   K 3.8 04/16/2017 1409   CL 102 09/17/2017 1140   CO2 25 09/17/2017 1140   CO2 25 04/16/2017 1409   GLUCOSE 100 (H) 09/17/2017 1140   GLUCOSE 134 04/16/2017 1409   BUN 11 09/17/2017 1140   BUN 12.4 04/16/2017 1409   CREATININE 0.76 09/17/2017 1140   CREATININE 0.9 04/16/2017 1409   CALCIUM 10.0 09/17/2017 1140   CALCIUM 9.6 04/16/2017 1409   GFRNONAA 88 09/17/2017 1140   GFRAA 101 09/17/2017 1140   Lab Results  Component Value Date   HGBA1C 6.2 (H) 09/17/2017   Lab Results  Component Value Date   INSULIN 24.2 09/17/2017   CBC    Component Value Date/Time   WBC 5.7 04/16/2017 1409   WBC 7.3 05/22/2016 1411   RBC 4.88 04/16/2017 1409   RBC 4.93 05/22/2016 1411   HGB 13.7 04/16/2017 1409   HCT 41.2 04/16/2017 1409   PLT 259 04/16/2017 1409   MCV 84.4 04/16/2017 1409   MCH 28.1 04/16/2017 1409   MCH 28.8 05/22/2016 1411   MCHC 33.3 04/16/2017 1409   MCHC 33.6 05/22/2016 1411   RDW 13.2 04/16/2017 1409   LYMPHSABS 1.9 04/16/2017 1409   MONOABS 0.6 04/16/2017 1409   EOSABS 0.1 04/16/2017 1409   BASOSABS 0.0 04/16/2017 1409   Iron/TIBC/Ferritin/ %Sat No results found for: IRON, TIBC, FERRITIN, IRONPCTSAT Lipid Panel  No results found for: CHOL, TRIG, HDL, CHOLHDL, VLDL, LDLCALC, LDLDIRECT Hepatic Function Panel     Component Value Date/Time   PROT 6.9 09/17/2017 1140   PROT 7.0  04/16/2017 1409   ALBUMIN 4.5 09/17/2017 1140   ALBUMIN 4.0 04/16/2017 1409   AST 35 09/17/2017 1140   AST 29 04/16/2017 1409   ALT 45 (H) 09/17/2017 1140   ALT 34 04/16/2017 1409   ALKPHOS 91 09/17/2017 1140   ALKPHOS 74 04/16/2017 1409   BILITOT 0.3 09/17/2017 1140   BILITOT 0.40 04/16/2017 1409      Component Value Date/Time   TSH 2.390 09/17/2017 1140   Vitamin D No recent labs  ECG  shows NSR with a rate of 81 BPM INDIRECT CALORIMETER done today shows a VO2 of 318 and a REE of 2216. Her calculated basal metabolic rate is 4098 thus her basal metabolic rate is better than expected.    ASSESSMENT AND PLAN: Other fatigue - Plan: EKG 12-Lead, Comprehensive metabolic panel, Vitamin J19, Folate, T3, T4, free, TSH  Shortness of breath on exertion  Other hyperlipidemia - Plan: Comprehensive metabolic panel  Hyperglycemia - Plan: Hemoglobin A1c, Insulin, random  Screening for depression  At risk for heart disease  Class 2 severe obesity with serious comorbidity and body mass index (BMI) of 39.0 to 39.9 in adult, unspecified obesity type (HCC)  PLAN:  Fatigue Fleta was informed that her fatigue may be related to obesity, depression or many other causes. Labs will be ordered, and in the meanwhile Monchel has agreed to work on diet, exercise and weight loss to help with fatigue. Proper sleep hygiene was discussed including the need for 7-8 hours of quality sleep each night. A sleep study was not ordered based on symptoms and Epworth score.  Dyspnea on exertion Yamile's shortness of breath appears to be obesity related and exercise induced. She has agreed to work on weight loss and gradually increase exercise to treat her exercise induced shortness of breath. If Candace [name] follows our instructions and loses weight without improvement of her shortness of breath, we will plan to refer to pulmonology. We will monitor this condition regularly. Collin agrees to this  plan.  Hyperlipidemia Candace [name] was informed of the American Heart Association Guidelines emphasizing intensive lifestyle modifications as the first line treatment for hyperlipidemia. We discussed many lifestyle modifications today in depth, and Candace [name] will continue to work on decreasing saturated fats such as fatty red meat, butter and many fried foods. She will also increase vegetables and lean protein in her diet and continue to work on exercise and weight loss efforts. Candace [name] had FLP done yesterday, she is to bring labs with her at next visit. Candace [name] agrees to follow up with our clinic in 2 weeks.  Cardiovascular risk counselling Candace [name] was given extended (15 minutes) coronary artery disease prevention counseling today. She is 57 y.o. female and has risk factors for heart disease including obesity and hyperlipidemia. We discussed intensive lifestyle modifications today with an emphasis on specific weight loss instructions and strategies. Pt was also informed of the importance of increasing exercise and decreasing saturated fats to help prevent heart disease.  Hyperglycemia Fasting labs will be obtained and results with be discussed with Candace [name] in 2 weeks at her follow up visit. In the meanwhile Candace [name] was started on a lower simple carbohydrate diet and will work on weight loss efforts. Candace [name] agrees to follow up with our clinic in 2 weeks.  Depression Screen Candace [name] had a strongle positive depression  screening. Depression is commonly associated with obesity and often results in emotional eating behaviors. We will monitor this closely and work on CBT to help improve the non-hunger eating patterns. Referral to Psychology may be required if no improvement is seen as she continues in our clinic.  Obesity Candace [name] is currently in the action stage of change and her goal is to continue with weight loss efforts She has agreed to follow the Category 4 plan Candace [name] has been instructed to work up to a goal of 150  minutes of combined cardio and strengthening exercise per week for weight loss and overall health benefits. We discussed the following Behavioral Modification Strategies today: increasing lean protein intake, decrease eating out, work on meal planning and easy cooking plans, and keeping healthy foods in the home.  Candace [name] has agreed to follow up with our clinic in 2 weeks. She was informed of the importance of frequent follow up visits to maximize her success with intensive lifestyle modifications for her multiple health conditions. She was informed we would discuss her lab results at her next visit unless there is a critical issue that needs to be addressed sooner. Candace [name] agreed to keep her next visit at the agreed upon time to discuss these results.    OBESITY BEHAVIORAL INTERVENTION VISIT  Today's visit was # 1 out of 22.  Starting weight: 249 lbs Starting date: 09/17/17 Today's weight : 249 lbs  Today's date: 09/17/2017 Total lbs lost to date: 0 (Patients must lose 7 lbs in the first 6 months to continue with counseling)   ASK: We discussed the diagnosis of obesity with Candace [name] today and Aleeyah agreed to give [name] permission to discuss obesity behavioral modification therapy today.  ASSESS: Coralee has the diagnosis of obesity and her BMI today is 38.99 Jenness is in the action stage of change   ADVISE: Sueko was educated on the multiple health risks of obesity as well as the benefit of weight loss to improve her health. She was advised of the need for long term treatment and the importance of lifestyle modifications.  AGREE: Multiple dietary modification options and treatment options were discussed and  Romayne agreed to the above obesity treatment plan.   I, Trixie Dredge, am acting as transcriptionist for Ilene Qua, MD   I have reviewed the above documentation for accuracy and completeness, and I agree with the above. - Ilene Qua, MD

## 2017-09-25 MED FILL — rOPINIRole HCL 0.5 MG TABS: 0.5 | 90 days supply | Qty: 90 | Fill #2

## 2017-10-02 ENCOUNTER — Ambulatory Visit (INDEPENDENT_AMBULATORY_CARE_PROVIDER_SITE_OTHER): Payer: 59 | Admitting: Family Medicine

## 2017-10-02 VITALS — BP 147/78 | HR 76 | Ht 67.0 in | Wt 239.0 lb

## 2017-10-02 DIAGNOSIS — Z9189 Other specified personal risk factors, not elsewhere classified: Secondary | ICD-10-CM | POA: Diagnosis not present

## 2017-10-02 DIAGNOSIS — Z6837 Body mass index (BMI) 37.0-37.9, adult: Secondary | ICD-10-CM

## 2017-10-02 DIAGNOSIS — E559 Vitamin D deficiency, unspecified: Secondary | ICD-10-CM | POA: Diagnosis not present

## 2017-10-02 DIAGNOSIS — R7303 Prediabetes: Secondary | ICD-10-CM | POA: Diagnosis not present

## 2017-10-02 DIAGNOSIS — R030 Elevated blood-pressure reading, without diagnosis of hypertension: Secondary | ICD-10-CM

## 2017-10-02 MED ORDER — VITAMIN D (ERGOCALCIFEROL) 1.25 MG (50000 UNIT) PO CAPS: 50000.0000 [IU] | ORAL_CAPSULE | ORAL | 0 refills | Status: DC

## 2017-10-02 MED ORDER — METFORMIN HCL 500 MG PO TABS: 500.0000 mg | ORAL_TABLET | Freq: Every day | ORAL | 0 refills | Status: DC

## 2017-10-02 MED FILL — metFORMIN HCL 500 MG TABS: 500 | 30 days supply | Qty: 30 | Fill #0

## 2017-10-02 MED FILL — VIT D2 1.25 MG (50,000 UNIT: 1.25 MG | 28 days supply | Qty: 4 | Fill #0

## 2017-10-02 NOTE — Progress Notes (Signed)
Office: [phone]  /  Fax: [phone]   HPI:   Chief Complaint: OBESITY Candace [name] is here to discuss her progress with her obesity treatment plan. She is on the Category 4 plan and is following her eating plan approximately 98 % of the time. She states she is exercising 0 minutes 0 times per week. Candace [name] went to a soccer tournament for her daughter last weekend, but she packed most of her food. She found eating food at work to be easy. She is really enjoying her snacks and she is only hungry 1 or 2 times a week. Her weight is 239 lb (108.4 kg) today and has had a weight loss of 10 pounds over a period of 2 weeks since her last visit. She has lost 10 lbs since starting treatment with [name].  Vitamin D deficiency Candace [name] has a diagnosis of vitamin D deficiency. She is currently taking OTC vit D 2,000 IU daily and denies nausea, vomiting or muscle weakness.  Pre-Diabetes Candace [name] has a diagnosis of pre-diabetes based on her elevated Hgb A1c of 6.2 and fasting insulin of 24.2 and was informed this puts her at greater risk of developing diabetes. She is not taking metformin currently and continues to work on diet and exercise to decrease risk of diabetes. She denies nausea or hypoglycemia.  At risk for diabetes Candace [name] is at higher than average risk for developing diabetes due to her obesity and pre-diabetes. She currently denies polyuria or polydipsia.  Elevated Blood Pressure Candace [name] is a 57 y.o. female with last blood pressure elevated at previous visit. She recently increased wellbutrin.  Candace [name] denies chest pain, chest pressure or headache. She is working weight loss to help control her blood pressure with the goal of decreasing her risk of heart attack and stroke.   ALLERGIES: No Known Allergies  MEDICATIONS: Current Outpatient Medications on File Prior to Visit  Medication Sig Dispense Refill  . anastrozole (ARIMIDEX) 1 MG tablet Take 1 tablet (1 mg total) by mouth daily. 90  tablet 4  . buPROPion (WELLBUTRIN XL) 300 MG 24 hr tablet Take 150 mg by mouth daily.    . cetirizine (ZYRTEC) 10 MG tablet Take 10 mg by mouth daily. [facility]    . esomeprazole (NEXIUM) 40 MG capsule Take 40 mg by mouth every 36 (thirty-six) hours.    [name] FLUoxetine (PROZAC) 10 MG capsule Take 10 mg by mouth daily with lunch.     . Multiple Vitamin (MULTIVITAMIN WITH MINERALS) TABS tablet Take 1 tablet by mouth daily.    [name] rOPINIRole (REQUIP) 0.25 MG tablet Take 0.5 mg by mouth daily at 8 pm.    . rosuvastatin (CRESTOR) 10 MG tablet Take 10 mg by mouth at bedtime.     No current facility-administered medications on file prior to visit.     PAST MEDICAL HISTORY: Past Medical History:  Diagnosis Date  . Abnormal Pap smear   . Alcohol abuse   . Anovulation   . Anxiety   . Breast cancer ([place]) 04/05/2016   right breast  . Cancer ([place]) 2017   right breast cancer  . Complication of anesthesia    slow to wake up  . Depression   . Endometrial hyperplasia   . Endometrial polyp    h/o  . Epidermal cyst    h/o  . Gallbladder problem   . GERD (gastroesophageal reflux disease)   . H/O hemorrhoids   . H/O menorrhagia   . High risk  HPV infection   . Hyperlipidemia   . Insomnia   . Obesity   . Oligomenorrhea   . Prediabetes   . Restless legs syndrome   . Seasonal allergies   . Vitamin D deficiency     PAST SURGICAL HISTORY: Past Surgical History:  Procedure Laterality Date  . BREAST LUMPECTOMY Right 05/2016   radiation  . BREAST LUMPECTOMY WITH RADIOACTIVE SEED AND SENTINEL LYMPH NODE BIOPSY Right 05/28/2016   Procedure: RADIOACTIVE SEED GUIDED RIGHT BREAST LUMPECTOMY WITH  RIGHT AXILLARY SENTINEL LYMPH NODE BIOPSY;  Surgeon: [name], MD;  Location: [facility];  Service: General;  Laterality: Right;  . CARPAL TUNNEL RELEASE  03/26/2011   right hand  . CARPAL TUNNEL RELEASE  05/28/2011   Procedure: CARPAL TUNNEL RELEASE;  Surgeon: [name];  Location: [facility];  Service: Orthopedics;  Laterality: Left;  . CHOLECYSTECTOMY N/A 01/24/2013   Procedure: LAPAROSCOPIC CHOLECYSTECTOMY WITH INTRAOPERATIVE CHOLANGIOGRAM;  Surgeon: [name], MD;  Location: [facility];  Service: General;  Laterality: N/A;  . COLONOSCOPY    . HYSTEROSCOPY    . POLYPECTOMY    . TONSILLECTOMY  1967   as child  . trigger fingers  8 yrs. ago   both done months apart  . uterine ablation  06/04/2010  . uterine ablation      SOCIAL HISTORY: Social History   Tobacco Use  . Smoking status: Never Smoker  . Smokeless tobacco: Never Used  Substance Use Topics  . Alcohol use: No    Comment: none since 1996  . Drug use: No    FAMILY HISTORY: Family History  Problem Relation Age of Onset  . Heart disease Mother   . Hypertension Mother   . Stroke Mother   . Dementia Mother        vascular dementia  . Hyperlipidemia Mother   . Depression Mother   . Anxiety disorder Mother   . Alcohol abuse Mother   . Obesity Mother   . Pulmonary fibrosis Father        d. 12  . Depression Father   . Anxiety disorder Father   . Alcohol abuse Father   . Heart attack Maternal Grandmother        d. [phone]  . Heart disease Maternal Grandmother   . Heart disease Maternal Grandfather        d. 60-63  . Leukemia Paternal Grandmother        d. late 21s  . Breast cancer Sister 83       IDC s/p mastectomy and chemo    ROS: Review of Systems  Constitutional: Positive for weight loss.  Gastrointestinal: Negative for nausea and vomiting.  Genitourinary: Negative for frequency.  Musculoskeletal:       Negative for muscle weakness  Endo/Heme/Allergies: Negative for polydipsia.    PHYSICAL EXAM: Blood pressure (!) 147/78, pulse 76, height 5\' 7"  (1.702 m), weight 239 lb (108.4 kg), SpO2 98 %. Body mass index is 37.43 kg/m. Physical Exam  Constitutional: She is oriented to person, place, and time. She appears well-developed and well-nourished.    Cardiovascular: Normal rate.  Pulmonary/Chest: Effort normal.  Musculoskeletal: Normal range of motion.  Neurological: She is oriented to person, place, and time.  Skin: Skin is warm and dry.  Psychiatric: She has a normal mood and affect. Her behavior is normal.  Vitals reviewed.   RECENT LABS AND TESTS: BMET    Component Value Date/Time   NA 142 09/17/2017 1140   NA  141 04/16/2017 1409   K 4.4 09/17/2017 1140   K 3.8 04/16/2017 1409   CL 102 09/17/2017 1140   CO2 25 09/17/2017 1140   CO2 25 04/16/2017 1409   GLUCOSE 100 (H) 09/17/2017 1140   GLUCOSE 134 04/16/2017 1409   BUN 11 09/17/2017 1140   BUN 12.4 04/16/2017 1409   CREATININE 0.76 09/17/2017 1140   CREATININE 0.9 04/16/2017 1409   CALCIUM 10.0 09/17/2017 1140   CALCIUM 9.6 04/16/2017 1409   GFRNONAA 88 09/17/2017 1140   GFRAA 101 09/17/2017 1140   Lab Results  Component Value Date   HGBA1C 6.2 (H) 09/17/2017   Lab Results  Component Value Date   INSULIN 24.2 09/17/2017   CBC    Component Value Date/Time   WBC 5.7 04/16/2017 1409   WBC 7.3 05/22/2016 1411   RBC 4.88 04/16/2017 1409   RBC 4.93 05/22/2016 1411   HGB 13.7 04/16/2017 1409   HCT 41.2 04/16/2017 1409   PLT 259 04/16/2017 1409   MCV 84.4 04/16/2017 1409   MCH 28.1 04/16/2017 1409   MCH 28.8 05/22/2016 1411   MCHC 33.3 04/16/2017 1409   MCHC 33.6 05/22/2016 1411   RDW 13.2 04/16/2017 1409   LYMPHSABS 1.9 04/16/2017 1409   MONOABS 0.6 04/16/2017 1409   EOSABS 0.1 04/16/2017 1409   BASOSABS 0.0 04/16/2017 1409   Iron/TIBC/Ferritin/ %Sat No results found for: IRON, TIBC, FERRITIN, IRONPCTSAT Lipid Panel  No results found for: CHOL, TRIG, HDL, CHOLHDL, VLDL, LDLCALC, LDLDIRECT Hepatic Function Panel     Component Value Date/Time   PROT 6.9 09/17/2017 1140   PROT 7.0 04/16/2017 1409   ALBUMIN 4.5 09/17/2017 1140   ALBUMIN 4.0 04/16/2017 1409   AST 35 09/17/2017 1140   AST 29 04/16/2017 1409   ALT 45 (H) 09/17/2017 1140   ALT 34  04/16/2017 1409   ALKPHOS 91 09/17/2017 1140   ALKPHOS 74 04/16/2017 1409   BILITOT 0.3 09/17/2017 1140   BILITOT 0.40 04/16/2017 1409      Component Value Date/Time   TSH 2.390 09/17/2017 1140    ASSESSMENT AND PLAN: Vitamin D deficiency - Plan: Vitamin D, Ergocalciferol, (DRISDOL) 50000 units CAPS capsule  Prediabetes - Plan: metFORMIN (GLUCOPHAGE) 500 MG tablet  Elevated BP without diagnosis of hypertension  At risk for diabetes mellitus  Class 2 severe obesity with serious comorbidity and body mass index (BMI) of 37.0 to 37.9 in adult, unspecified obesity type ([place])  PLAN:  Vitamin D Deficiency Candace [name] was informed that low vitamin D levels contributes to fatigue and are associated with obesity, breast, and colon cancer. She agrees to start to take prescription Vit D @50 ,000 IU every week #4 with no refills and stop OTC Vit D daily. She will follow up for routine testing of vitamin D, at least 2-3 times per year. She was informed of the risk of over-replacement of vitamin D and agrees to not increase her dose unless she discusses this with [name] first.  Pre-Diabetes Candace [name] will continue to work on weight loss, exercise, and decreasing simple carbohydrates in her diet to help decrease the risk of diabetes. We dicussed metformin including benefits and risks. She was informed that eating too many simple carbohydrates or too many calories at one sitting increases the likelihood of GI side effects. Candace [name] agrees to start metformin 500 mg by mouth qAM #30 with no refills and follow up with [name] as directed to monitor her progress.  Diabetes risk counseling Candace [name] was given extended (30 minutes)  diabetes prevention counseling today. She is 57 y.o. female and has risk factors for diabetes including obesity and pre-diabetes. We discussed intensive lifestyle modifications today with an emphasis on weight loss as well as increasing exercise and decreasing simple carbohydrates in her  diet.  Elevated Blood Pressure We discussed sodium restriction, working on healthy weight loss, and a regular exercise program as the means to achieve improved blood pressure control. Candace [name] agreed with this plan and agreed to follow up as directed. We will continue to monitor her blood pressure as well as her progress with the above lifestyle modifications.   Obesity Candace [name] is currently in the action stage of change. As such, her goal is to continue with weight loss efforts She has agreed to follow the Category 4 plan Candace [name] has been instructed to work up to a goal of 150 minutes of combined cardio and strengthening exercise per week for weight loss and overall health benefits. We discussed the following Behavioral Modification Strategies today: keeping healthy foods in the home, better snacking choices, travel eating strategies, planning for success, increasing lean protein intake and work on meal planning and easy cooking plans  Candace [name] has agreed to follow up with our clinic in 2 weeks. She was informed of the importance of frequent follow up visits to maximize her success with intensive lifestyle modifications for her multiple health conditions.   OBESITY BEHAVIORAL INTERVENTION VISIT  Today's visit was # 2 out of 22.  Starting weight: 249 lbs Starting date: 09/17/17 Today's weight :239 lbs  Today's date: 10/02/2017 Total lbs lost to date: 10 (Patients must lose 7 lbs in the first 6 months to continue with counseling)   ASK: We discussed the diagnosis of obesity with Candace [name] today and Candace [name] agreed to give [name] permission to discuss obesity behavioral modification therapy today.  ASSESS: Candace [name] has the diagnosis of obesity and her BMI today is 37.42 Candace [name] is in the action stage of change   ADVISE: Candace [name] was educated on the multiple health risks of obesity as well as the benefit of weight loss to improve her health. She was advised of the need for long term treatment and the  importance of lifestyle modifications.  AGREE: Multiple dietary modification options and treatment options were discussed and  Candace [name] agreed to the above obesity treatment plan.  I, Doreene Nest, am acting as transcriptionist for Eber Jones, MD  I have reviewed the above documentation for accuracy and completeness, and I agree with the above. - Ilene Qua, MD

## 2017-10-09 ENCOUNTER — Encounter (INDEPENDENT_AMBULATORY_CARE_PROVIDER_SITE_OTHER): Payer: Self-pay | Admitting: Family Medicine

## 2017-10-13 ENCOUNTER — Ambulatory Visit: Payer: 59 | Admitting: Oncology

## 2017-10-13 ENCOUNTER — Other Ambulatory Visit: Payer: 59

## 2017-10-14 MED FILL — FLUoxetine HCL 10 MG CAPS: 10 | 90 days supply | Qty: 90 | Fill #0

## 2017-10-15 DIAGNOSIS — Z6837 Body mass index (BMI) 37.0-37.9, adult: Secondary | ICD-10-CM | POA: Diagnosis not present

## 2017-10-15 DIAGNOSIS — Z01419 Encounter for gynecological examination (general) (routine) without abnormal findings: Secondary | ICD-10-CM | POA: Diagnosis not present

## 2017-10-16 ENCOUNTER — Ambulatory Visit (INDEPENDENT_AMBULATORY_CARE_PROVIDER_SITE_OTHER): Payer: 59 | Admitting: Family Medicine

## 2017-10-16 VITALS — BP 137/85 | HR 86 | Temp 98.4°F | Ht 67.0 in | Wt 232.0 lb

## 2017-10-16 DIAGNOSIS — Z9189 Other specified personal risk factors, not elsewhere classified: Secondary | ICD-10-CM | POA: Diagnosis not present

## 2017-10-16 DIAGNOSIS — Z6836 Body mass index (BMI) 36.0-36.9, adult: Secondary | ICD-10-CM | POA: Diagnosis not present

## 2017-10-16 DIAGNOSIS — R7303 Prediabetes: Secondary | ICD-10-CM | POA: Diagnosis not present

## 2017-10-16 DIAGNOSIS — E559 Vitamin D deficiency, unspecified: Secondary | ICD-10-CM

## 2017-10-16 MED ORDER — METFORMIN HCL 500 MG PO TABS: 500.0000 mg | ORAL_TABLET | Freq: Every day | ORAL | 0 refills | Status: DC

## 2017-10-16 MED ORDER — VITAMIN D (ERGOCALCIFEROL) 1.25 MG (50000 UNIT) PO CAPS: 50000.0000 [IU] | ORAL_CAPSULE | ORAL | 0 refills | Status: DC

## 2017-10-19 NOTE — Progress Notes (Signed)
Office: [phone]  /  Fax: [phone]   HPI:   Chief Complaint: OBESITY Candace [name] is here to discuss her progress with her obesity treatment plan. She is on the Category 4 plan and is following her eating plan approximately 95 % of the time. She states she is walking the dog for 30 minutes 7 times per week. Candace [name] continues to do well with weight loss. Her hunger is mostly controlled and she is doing better with meal planning. Her daughter is developing anorexia so her stress is increased and meal planning is difficult.  Her weight is 232 lb (105.2 kg) today and has had a weight loss of 7 pounds over a period of 2 weeks since her last visit. She has lost 17 lbs since starting treatment with [name].  Pre-Diabetes Candace [name] has a diagnosis of pre-diabetes based on her elevated Hgb A1c and was informed this puts her at greater risk of developing diabetes. She started metformin and she denies nausea, vomiting, or hypoglycemia. She is doing well on diet prescription and continues to work on exercise to decrease risk of diabetes.  At risk for diabetes Candace [name] is at higher than average risk for developing diabetes due to her obesity and pre-diabetes. She currently denies polyuria or polydipsia.  Vitamin D Deficiency Candace [name] has a diagnosis of vitamin D deficiency. She is stable on prescription Vit D, not yet at goal. She denies nausea, vomiting or muscle weakness.  ALLERGIES: No Known Allergies  MEDICATIONS: Current Outpatient Medications on File Prior to Visit  Medication Sig Dispense Refill  . anastrozole (ARIMIDEX) 1 MG tablet Take 1 tablet (1 mg total) by mouth daily. 90 tablet 4  . buPROPion (WELLBUTRIN XL) 300 MG 24 hr tablet Take 150 mg by mouth daily.    . cetirizine (ZYRTEC) 10 MG tablet Take 10 mg by mouth daily. [facility]    . esomeprazole (NEXIUM) 40 MG capsule Take 40 mg by mouth every 36 (thirty-six) hours.    [name] FLUoxetine (PROZAC) 10 MG capsule Take 10 mg by  mouth daily with lunch.     . Multiple Vitamin (MULTIVITAMIN WITH MINERALS) TABS tablet Take 1 tablet by mouth daily.    [name] rOPINIRole (REQUIP) 0.25 MG tablet Take 0.5 mg by mouth daily at 8 pm.    . rosuvastatin (CRESTOR) 10 MG tablet Take 10 mg by mouth at bedtime.     No current facility-administered medications on file prior to visit.     PAST MEDICAL HISTORY: Past Medical History:  Diagnosis Date  . Abnormal Pap smear   . Alcohol abuse   . Anovulation   . Anxiety   . Breast cancer ([place]) 04/05/2016   right breast  . Cancer ([place]) 2017   right breast cancer  . Complication of anesthesia    slow to wake up  . Depression   . Endometrial hyperplasia   . Endometrial polyp    h/o  . Epidermal cyst    h/o  . Gallbladder problem   . GERD (gastroesophageal reflux disease)   . H/O hemorrhoids   . H/O menorrhagia   . High risk HPV infection   . Hyperlipidemia   . Insomnia   . Obesity   . Oligomenorrhea   . Prediabetes   . Restless legs syndrome   . Seasonal allergies   . Vitamin D deficiency     PAST SURGICAL HISTORY: Past Surgical History:  Procedure Laterality Date  . BREAST LUMPECTOMY Right 05/2016   radiation  . BREAST  LUMPECTOMY WITH RADIOACTIVE SEED AND SENTINEL LYMPH NODE BIOPSY Right 05/28/2016   Procedure: RADIOACTIVE SEED GUIDED RIGHT BREAST LUMPECTOMY WITH  RIGHT AXILLARY SENTINEL LYMPH NODE BIOPSY;  Surgeon: [name], MD;  Location: [facility];  Service: General;  Laterality: Right;  . CARPAL TUNNEL RELEASE  03/26/2011   right hand  . CARPAL TUNNEL RELEASE  05/28/2011   Procedure: CARPAL TUNNEL RELEASE;  Surgeon: [name];  [facility];  Service: Orthopedics;  Laterality: Left;  . CHOLECYSTECTOMY N/A 01/24/2013   Procedure: LAPAROSCOPIC CHOLECYSTECTOMY WITH INTRAOPERATIVE CHOLANGIOGRAM;  Surgeon: [name], MD;  Location: [facility];  Service: General;  Laterality: N/A;  . COLONOSCOPY    . HYSTEROSCOPY    . POLYPECTOMY    .  TONSILLECTOMY  1967   as child  . trigger fingers  8 yrs. ago   both done months apart  . uterine ablation  06/04/2010  . uterine ablation      SOCIAL HISTORY: Social History   Tobacco Use  . Smoking status: Never Smoker  . Smokeless tobacco: Never Used  Substance Use Topics  . Alcohol use: No    Comment: none since 1996  . Drug use: No    FAMILY HISTORY: Family History  Problem Relation Age of Onset  . Heart disease Mother   . Hypertension Mother   . Stroke Mother   . Dementia Mother        vascular dementia  . Hyperlipidemia Mother   . Depression Mother   . Anxiety disorder Mother   . Alcohol abuse Mother   . Obesity Mother   . Pulmonary fibrosis Father        d. 33  . Depression Father   . Anxiety disorder Father   . Alcohol abuse Father   . Heart attack Maternal Grandmother        d. [phone]  . Heart disease Maternal Grandmother   . Heart disease Maternal Grandfather        d. 60-63  . Leukemia Paternal Grandmother        d. late 27s  . Breast cancer Sister 62       IDC s/p mastectomy and chemo    ROS: Review of Systems  Constitutional: Positive for weight loss.  Gastrointestinal: Negative for nausea and vomiting.  Genitourinary: Negative for frequency.  Musculoskeletal:       Negative muscle weakness  Endo/Heme/Allergies: Negative for polydipsia.       Negative hypoglycemia    PHYSICAL EXAM: Blood pressure 137/85, pulse 86, temperature 98.4 F (36.9 C), temperature source Oral, height 5\' 7"  (1.702 m), weight 232 lb (105.2 kg), SpO2 97 %. Body mass index is 36.34 kg/m. Physical Exam  Constitutional: She is oriented to person, place, and time. She appears well-developed and well-nourished.  Cardiovascular: Normal rate.  Pulmonary/Chest: Effort normal.  Musculoskeletal: Normal range of motion.  Neurological: She is oriented to person, place, and time.  Skin: Skin is warm and dry.  Psychiatric: She has a normal mood and affect. Her behavior is  normal.  Vitals reviewed.   RECENT LABS AND TESTS: BMET    Component Value Date/Time   NA 142 09/17/2017 1140   NA 141 04/16/2017 1409   K 4.4 09/17/2017 1140   K 3.8 04/16/2017 1409   CL 102 09/17/2017 1140   CO2 25 09/17/2017 1140   CO2 25 04/16/2017 1409   GLUCOSE 100 (H) 09/17/2017 1140   GLUCOSE 134 04/16/2017 1409   BUN 11 09/17/2017 1140  BUN 12.4 04/16/2017 1409   CREATININE 0.76 09/17/2017 1140   CREATININE 0.9 04/16/2017 1409   CALCIUM 10.0 09/17/2017 1140   CALCIUM 9.6 04/16/2017 1409   GFRNONAA 88 09/17/2017 1140   GFRAA 101 09/17/2017 1140   Lab Results  Component Value Date   HGBA1C 6.2 (H) 09/17/2017   Lab Results  Component Value Date   INSULIN 24.2 09/17/2017   CBC    Component Value Date/Time   WBC 5.7 04/16/2017 1409   WBC 7.3 05/22/2016 1411   RBC 4.88 04/16/2017 1409   RBC 4.93 05/22/2016 1411   HGB 13.7 04/16/2017 1409   HCT 41.2 04/16/2017 1409   PLT 259 04/16/2017 1409   MCV 84.4 04/16/2017 1409   MCH 28.1 04/16/2017 1409   MCH 28.8 05/22/2016 1411   MCHC 33.3 04/16/2017 1409   MCHC 33.6 05/22/2016 1411   RDW 13.2 04/16/2017 1409   LYMPHSABS 1.9 04/16/2017 1409   MONOABS 0.6 04/16/2017 1409   EOSABS 0.1 04/16/2017 1409   BASOSABS 0.0 04/16/2017 1409   Iron/TIBC/Ferritin/ %Sat No results found for: IRON, TIBC, FERRITIN, IRONPCTSAT Lipid Panel  No results found for: CHOL, TRIG, HDL, CHOLHDL, VLDL, LDLCALC, LDLDIRECT Hepatic Function Panel     Component Value Date/Time   PROT 6.9 09/17/2017 1140   PROT 7.0 04/16/2017 1409   ALBUMIN 4.5 09/17/2017 1140   ALBUMIN 4.0 04/16/2017 1409   AST 35 09/17/2017 1140   AST 29 04/16/2017 1409   ALT 45 (H) 09/17/2017 1140   ALT 34 04/16/2017 1409   ALKPHOS 91 09/17/2017 1140   ALKPHOS 74 04/16/2017 1409   BILITOT 0.3 09/17/2017 1140   BILITOT 0.40 04/16/2017 1409      Component Value Date/Time   TSH 2.390 09/17/2017 1140    ASSESSMENT AND PLAN: Prediabetes - Plan: metFORMIN  (GLUCOPHAGE) 500 MG tablet  Vitamin D deficiency - Plan: Vitamin D, Ergocalciferol, (DRISDOL) 50000 units CAPS capsule  At risk for diabetes mellitus  Class 2 severe obesity with serious comorbidity and body mass index (BMI) of 36.0 to 36.9 in adult, unspecified obesity type ([place])  PLAN:  Pre-Diabetes Candace [name] will continue to work on weight loss, exercise, and decreasing simple carbohydrates in her diet to help decrease the risk of diabetes. We dicussed metformin including benefits and risks. She was informed that eating too many simple carbohydrates or too many calories at one sitting increases the likelihood of GI side effects. Candace [name] agrees to continue taking metformin 500 mg q AM #30 and we will refill for 1 month. Candace [name] agrees to follow up with our clinic in 2 to 3 weeks as directed to monitor her progress.  Diabetes risk counselling Candace [name] was given extended (15 minutes) diabetes prevention counseling today and pre-diabetes. She is 57 y.o. female and has risk factors for diabetes including obesity. We discussed intensive lifestyle modifications today with an emphasis on weight loss as well as increasing exercise and decreasing simple carbohydrates in her diet.  Vitamin D Deficiency Candace [name] was informed that low vitamin D levels contributes to fatigue and are associated with obesity, breast, and colon cancer. Candace [name] agrees to continue taking prescription Vit D @50 ,000 IU every week #4 and we will refill for 1 month. She will follow up for routine testing of vitamin D, at least 2-3 times per year. She was informed of the risk of over-replacement of vitamin D and agrees to not increase her dose unless she discusses this with [name] first. Candace [name] agrees to follow up with our clinic in  2 to 3 weeks.  Obesity Candace [name] is currently in the action stage of change. As such, her goal is to continue with weight loss efforts She has agreed to follow the Category 4 plan Candace [name] has been instructed to work up  to a goal of 150 minutes of combined cardio and strengthening exercise per week for weight loss and overall health benefits. We discussed the following Behavioral Modification Strategies today: increasing lean protein intake, decreasing simple carbohydrates, dealing with family or coworker sabotage, no skipping meals, and holiday eating strategies   Kurt has agreed to follow up with our clinic in 2 to 3 weeks. She was informed of the importance of frequent follow up visits to maximize her success with intensive lifestyle modifications for her multiple health conditions.   OBESITY BEHAVIORAL INTERVENTION VISIT  Today's visit was # 3 out of 22.  Starting weight: 249 lbs Starting date: 09/17/17 Today's weight : 232 lbs  Today's date: 10/16/2017 Total lbs lost to date: 17 (Patients must lose 7 lbs in the first 6 months to continue with counseling)   ASK: We discussed the diagnosis of obesity with Leland Johns today and Lanyia agreed to give [name] permission to discuss obesity behavioral modification therapy today.  ASSESS: Journiee has the diagnosis of obesity and her BMI today is 36.33 Lorren is in the action stage of change   ADVISE: Lenoir was educated on the multiple health risks of obesity as well as the benefit of weight loss to improve her health. She was advised of the need for long term treatment and the importance of lifestyle modifications.  AGREE: Multiple dietary modification options and treatment options were discussed and  Saul agreed to the above obesity treatment plan.  I, Trixie Dredge, am acting as transcriptionist for Dennard Nip, MD  I have reviewed the above documentation for accuracy and completeness, and I agree with the above. -Dennard Nip, MD

## 2017-10-28 MED FILL — BUPROPION HCL XL 300 MG TAB: 300 | 90 days supply | Qty: 90 | Fill #0

## 2017-10-28 MED FILL — ESOMEPRAZOLE MAG DR 40 MG C: 40 | 90 days supply | Qty: 90 | Fill #0

## 2017-10-28 MED FILL — VIT D2 1.25 MG (50,000 UNIT: 1.25 MG | 28 days supply | Qty: 4 | Fill #0

## 2017-10-28 MED FILL — metFORMIN HCL 500 MG TABS: 500 | 30 days supply | Qty: 30 | Fill #0

## 2017-10-29 DIAGNOSIS — F3340 Major depressive disorder, recurrent, in remission, unspecified: Secondary | ICD-10-CM | POA: Diagnosis not present

## 2017-10-29 DIAGNOSIS — E669 Obesity, unspecified: Secondary | ICD-10-CM | POA: Diagnosis not present

## 2017-11-04 ENCOUNTER — Ambulatory Visit (INDEPENDENT_AMBULATORY_CARE_PROVIDER_SITE_OTHER): Payer: 59 | Admitting: Family Medicine

## 2017-11-04 VITALS — BP 141/87 | HR 67 | Temp 97.9°F | Ht 67.0 in | Wt 228.0 lb

## 2017-11-04 DIAGNOSIS — E559 Vitamin D deficiency, unspecified: Secondary | ICD-10-CM

## 2017-11-04 DIAGNOSIS — F3289 Other specified depressive episodes: Secondary | ICD-10-CM

## 2017-11-04 DIAGNOSIS — R7303 Prediabetes: Secondary | ICD-10-CM

## 2017-11-04 DIAGNOSIS — Z6835 Body mass index (BMI) 35.0-35.9, adult: Secondary | ICD-10-CM

## 2017-11-04 NOTE — Progress Notes (Signed)
[facility]  [phone] [phone]     ID: BRADY PLANT DOB: Aug 19, 1960  MR#: [id number]  UGQ#:[id number]  Patient Care Team: [name], MD as PCP - General (Family Medicine) Magrinat, Virgie Dad, MD as Consulting Physician (Oncology) [name], MD as Consulting Physician (General Surgery) Leo Grosser Seymour Bars, MD as Consulting Physician (Obstetrics and Gynecology) Ronald Lobo, MD as Consulting Physician (Gastroenterology) Danella Sensing, MD as Consulting Physician (Dermatology) Delice Bison, Charlestine Massed, NP as Nurse Practitioner (Hematology and Oncology)  OTHER MD:  CHIEF COMPLAINT: Estrogen receptor positive breast cancer   CURRENT TREATMENT: Anastrozole   BREAST CANCER HISTORY: From the original intake note:  Feige had routine screening mammography with tomography at the [facility] 03/29/2016. On 04/04/2016 she underwent right diagnostic mammography and tomography with right breast ultrasonography. The breast density was category B. In the upper outer quadrant of the breast there was a mass measuring 0.7 cm, with irregular margins. By ultrasonography this was located at the 10:00 position 9 cm from the nipple measuring 0.6 cm. Right axillary ultrasound was negative.  Biopsy of the right breast mass in question 04/05/2016 showed ([id number]) and invasive lobular carcinoma, grade 2 or 3, E-cadherin negative, estrogen receptor 90% positive, progesterone receptor 5% positive, both with strong staining intensity, with an MIB-1 of 20%, and no HER-2 amplification, the signals ratio being 0.82 and the number per cell 1.60.  Bilateral breast MRI was obtained 04/23/2016. This confirmed a 0.6 cm nodule in the upper-outer quadrant of the right breast with no additional sites of concern in either breast and no abnormal appearing lymph nodes.  Her subsequent history is as detailed below.  INTERVAL HISTORY: Kash returns today for  follow-up and treatment of her estrogen receptor positive breast cancer. She continues on anastrozole, with good tolerance. She has occasional night sweats, and she sleeps with a fan to be comfortable. She denies issues with vaginal dryness.    REVIEW OF SYSTEMS: Heela reports that she started a healthy weight and wellness program in March 2019. She has lost over 20 lbs with exercise, diet, and life changes. She lost 10 lbs in the first 2 weeks. She felt very encouraged and positive. She walks 30 minutes with her dog. Her daughter are now 17 and 12, and they went on vacation to [place] during spring break. She is still working as a Marine scientist. She denies unusual headaches, visual changes, nausea, vomiting, or dizziness. There has been no unusual cough, phlegm production, or pleurisy. This been no change in bowel or bladder habits. She denies unexplained fatigue or unexplained weight loss, bleeding, rash, or fever. A detailed review of systems was otherwise stable.    PAST MEDICAL HISTORY: Past Medical History:  Diagnosis Date  . Abnormal Pap smear   . Alcohol abuse   . Anovulation   . Anxiety   . Breast cancer ([place]) 04/05/2016   right breast  . Cancer ([place]) 2017   right breast cancer  . Complication of anesthesia    slow to wake up  . Depression   . Endometrial hyperplasia   . Endometrial polyp    h/o  . Epidermal cyst    h/o  . Gallbladder problem   . GERD (gastroesophageal reflux disease)   . H/O hemorrhoids   . H/O menorrhagia   . High risk HPV infection   . Hyperlipidemia   . Insomnia   . Obesity   . Oligomenorrhea   . Prediabetes   . Restless legs syndrome   .  Seasonal allergies   . Vitamin D deficiency     PAST SURGICAL HISTORY: Past Surgical History:  Procedure Laterality Date  . BREAST LUMPECTOMY Right 05/2016   radiation  . BREAST LUMPECTOMY WITH RADIOACTIVE SEED AND SENTINEL LYMPH NODE BIOPSY Right 05/28/2016   Procedure: RADIOACTIVE SEED GUIDED RIGHT BREAST  LUMPECTOMY WITH  RIGHT AXILLARY SENTINEL LYMPH NODE BIOPSY;  Surgeon: [name], MD;  Location: [place];  Service: General;  Laterality: Right;  . CARPAL TUNNEL RELEASE  03/26/2011   right hand  . CARPAL TUNNEL RELEASE  05/28/2011   Procedure: CARPAL TUNNEL RELEASE;  Surgeon: [name];  Location: [facility];  Service: Orthopedics;  Laterality: Left;  . CHOLECYSTECTOMY N/A 01/24/2013   Procedure: LAPAROSCOPIC CHOLECYSTECTOMY WITH INTRAOPERATIVE CHOLANGIOGRAM;  Surgeon: [name], MD;  Location: [facility];  Service: General;  Laterality: N/A;  . COLONOSCOPY    . HYSTEROSCOPY    . POLYPECTOMY    . TONSILLECTOMY  1967   as child  . trigger fingers  8 yrs. ago   both done months apart  . uterine ablation  06/04/2010  . uterine ablation      FAMILY HISTORY Family History  Problem Relation Age of Onset  . Heart disease Mother   . Hypertension Mother   . Stroke Mother   . Dementia Mother        vascular dementia  . Hyperlipidemia Mother   . Depression Mother   . Anxiety disorder Mother   . Alcohol abuse Mother   . Obesity Mother   . Pulmonary fibrosis Father        d. 68  . Depression Father   . Anxiety disorder Father   . Alcohol abuse Father   . Heart attack Maternal Grandmother        d. [phone]  . Heart disease Maternal Grandmother   . Heart disease Maternal Grandfather        d. 60-63  . Leukemia Paternal Grandmother        d. late 66s  . Breast cancer Sister 15       IDC s/p mastectomy and chemo  Both of the patient's parents were only children. Her father died at age 48 from idiopathic pulmonary fibrosis. The patient's mother died at the age of 25 in the setting of dementia. The patient has one brother and one sister. Her sister was diagnosed at age 69 with breast cancer (a T1b invasive ductal carcinoma just behind the nipple, requiring mastectomy. Her Oncotype was 24, and she received chemotherapy 3. She is now doing well).  GYNECOLOGIC HISTORY:  No  LMP recorded. Patient has had an ablation. Menarche age 7, first live birth age ? She is GX P2. She stopped having periods December 2012 when she underwent endometrial ablation. 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 She is divorced and lives at home with her daughters Janace Hoard and [name] (age 77 and 52 as of May 2019) and 2 dogs. The patient's was brought up as an [place].    ADVANCED DIRECTIVES: In place. The patient's sister Halford Decamp is her healthcare part of attorney   HEALTH MAINTENANCE: Social History   Tobacco Use  . Smoking status: Never Smoker  . Smokeless tobacco: Never Used  Substance Use Topics  . Alcohol use: No    Comment: none since 1996  . Drug use: No  Colonoscopy:  PAP:  Bone density: 03/28/2017 showed a T score of 0.8 normal   No Known Allergies  Current Outpatient Medications  Medication Sig Dispense Refill  . anastrozole (ARIMIDEX) 1 MG tablet Take 1 tablet (1 mg total) by mouth daily. 90 tablet 4  . buPROPion (WELLBUTRIN XL) 300 MG 24 hr tablet Take 300 mg by mouth daily.     . cetirizine (ZYRTEC) 10 MG tablet Take 10 mg by mouth daily. [facility]    . esomeprazole (NEXIUM) 40 MG capsule Take 40 mg by mouth every 36 (thirty-six) hours.    [name] FLUoxetine (PROZAC) 10 MG capsule Take 10 mg by mouth daily with lunch.     . metFORMIN (GLUCOPHAGE) 500 MG tablet Take 1 tablet (500 mg total) by mouth daily with breakfast. 30 tablet 0  . Multiple Vitamin (MULTIVITAMIN WITH MINERALS) TABS tablet Take 1 tablet by mouth daily.    [name] rOPINIRole (REQUIP) 0.25 MG tablet Take 0.5 mg by mouth daily at 8 pm.    . rosuvastatin (CRESTOR) 10 MG tablet Take 10 mg by mouth at bedtime.    . Vitamin D, Ergocalciferol, (DRISDOL) 50000 units CAPS capsule Take 1 capsule (50,000 Units total) by mouth every  7 (seven) days. 4 capsule 0   No current facility-administered medications for this visit.     OBJECTIVE:Middle-aged white woman in no acute distress  Vitals:   11/11/17 1140  BP: 123/81  Pulse: 75  Resp: 18  Temp: 98.7 F (37.1 C)  SpO2: 96%     Body mass index is 35.88 kg/m.    ECOG FS:0 - Asymptomatic  Sclerae unicteric, pupils round and equal No cervical or supraclavicular adenopathy Lungs no rales or rhonchi Heart regular rate and rhythm Abd soft, nontender, positive bowel sounds MSK no focal spinal tenderness, no upper extremity lymphedema Neuro: nonfocal, well oriented, appropriate affect Breasts: On the right she is status post lumpectomy and radiation.  There is no evidence of local recurrence.  On the left breast is benign.  Both axillae are benign.   LAB RESULTS:  CMP     Component Value Date/Time   NA 142 09/17/2017 1140   NA 141 04/16/2017 1409   K 4.4 09/17/2017 1140   K 3.8 04/16/2017 1409   CL 102 09/17/2017 1140   CO2 25 09/17/2017 1140   CO2 25 04/16/2017 1409   GLUCOSE 100 (H) 09/17/2017 1140   GLUCOSE 134 04/16/2017 1409   BUN 11 09/17/2017 1140   BUN 12.4 04/16/2017 1409   CREATININE 0.76 09/17/2017 1140   CREATININE 0.9 04/16/2017 1409   CALCIUM 10.0 09/17/2017 1140   CALCIUM 9.6 04/16/2017 1409   PROT 6.9 09/17/2017 1140   PROT 7.0 04/16/2017 1409   ALBUMIN 4.5 09/17/2017 1140   ALBUMIN 4.0 04/16/2017 1409   AST 35 09/17/2017 1140   AST 29 04/16/2017 1409   ALT 45 (H) 09/17/2017 1140   ALT 34 04/16/2017 1409   ALKPHOS 91 09/17/2017 1140   ALKPHOS 74 04/16/2017 1409   BILITOT 0.3 09/17/2017 1140   BILITOT 0.40 04/16/2017 1409   GFRNONAA 88 09/17/2017 1140   GFRAA 101 09/17/2017 1140    INo results found for: SPEP, UPEP  Lab Results  Component Value Date   WBC 6.8 11/11/2017   NEUTROABS 3.9 11/11/2017   HGB 14.2 11/11/2017   HCT 42.2 11/11/2017   MCV 84.1 11/11/2017   PLT 248 11/11/2017      Chemistry      Component  Value Date/Time   NA 142 09/17/2017 1140   NA 141 04/16/2017 1409   K 4.4 09/17/2017 1140   K 3.8 04/16/2017 1409   CL 102 09/17/2017 1140   CO2 25 09/17/2017 1140   CO2 25 04/16/2017 1409   BUN 11 09/17/2017 1140   BUN 12.4 04/16/2017 1409   CREATININE 0.76 09/17/2017 1140   CREATININE 0.9 04/16/2017 1409      Component Value Date/Time   CALCIUM 10.0 09/17/2017 1140   CALCIUM 9.6 04/16/2017 1409   ALKPHOS 91 09/17/2017 1140   ALKPHOS 74 04/16/2017 1409   AST 35 09/17/2017 1140   AST 29 04/16/2017 1409   ALT 45 (H) 09/17/2017 1140   ALT 34 04/16/2017 1409   BILITOT 0.3 09/17/2017 1140   BILITOT 0.40 04/16/2017 1409       No results found for: LABCA2  No components found for: LABCA125  No results for input(s): INR in the last 168 hours.  Urinalysis    Component Value Date/Time   COLORURINE YELLOW 01/24/2013 0327   APPEARANCEUR TURBID (A) 01/24/2013 0327   LABSPEC 1.021 01/24/2013 0327   PHURINE 7.5 01/24/2013 0327   GLUCOSEU NEGATIVE 01/24/2013 0327   HGBUR NEGATIVE 01/24/2013 0327   BILIRUBINUR NEGATIVE 01/24/2013 0327   KETONESUR 15 (A) 01/24/2013 0327   PROTEINUR NEGATIVE 01/24/2013 0327   UROBILINOGEN 0.2 01/24/2013 0327   NITRITE NEGATIVE 01/24/2013 0327   LEUKOCYTESUR NEGATIVE 01/24/2013 0327     STUDIES: Mammographic density discussed: It is favorable  ELIGIBLE FOR AVAILABLE RESEARCH PROTOCOL: no  ASSESSMENT: 57 y.o. BRCA negative [place] woman status post right breast upper outer quadrant biopsy 04/05/2016 for a clinical T1b N0, stage IA invasive lobular breast cancer, grade 2 or 3, estrogen and progesterone receptor positive, HER-2 not amplified, with an MIB-1 of 20%  (1) genetics testing 04/23/2016 through the [facility] offered by [facility] Hope Pigeon, MD)  found no deleterious mutations in  ATM, BARD1, BRCA1, BRCA2, BRIP1, CDH1, CHEK2, FANCC, MLH1, MSH2, MSH6, NBN, PALB2, PMS2, PTEN, RAD51C, RAD51D, TP53, and  XRCC2.  This panel also includes deletion/duplication analysis (without sequencing) for one gene, EPCAM  (2) status post right lumpectomy and sentinel lymph node sampling 05/28/2016 for a pT1b pN0, stage IA invasive lobular breast cancer, grade 2, with negative margins  (3) Oncotype Dx score of 22 predicts a 10 year risk of recurrence outside the breast of 14% if the patient's only systemic therapy is tamoxifen for 5 years. It predicts a benefit from chemotherapy in the 4% range.   Patient opted against adjuvant chemotherapy(a)   (4) adjuvant radiation 07/22/16-09/04/16 Site/dose:   1) Right breast/ 50.4 Gy in 28 fractions                         2) Right breast boost/ 10 Gy in 5 fractions  (5) started anastrozole September 29 2016   (a) bone density 03/28/2017 [facility] showed a T score of 0.8, normal   PLAN: Jodye is now a year and a half out from definitive surgery for breast cancer with no evidence of disease recurrence.  This is favorable.  She is tolerating anastrozole well and the plan will be to continue that for a total of 5 years.  Will be due for bone density September 2020  I gave her information on our tai chi class which I think she would benefit from  At this point I feel comfortable seeing her on  a once a year basis.  She knows to call for any problems that may develop before the next visit.  Magrinat, Virgie Dad, MD  11/11/17 12:11 PM Medical Oncology and Hematology [facility] [address] [place], [place] [address] Tel. [phone]    Fax. [phone]  This document serves as a record of services personally performed by Lurline Del, MD. It was created on his behalf by Sheron Nightingale, a trained medical scribe. The creation of this record is based on the scribe's personal observations and the provider's statements to them.   I have reviewed the above documentation for accuracy and completeness, and I agree with the above.

## 2017-11-05 NOTE — Progress Notes (Signed)
Office: [phone]  /  Fax: [phone]   HPI:   Chief Complaint: OBESITY Candace [name] is here to discuss her progress with her obesity treatment plan. She is on the Category 4 plan and is following her eating plan approximately 50 % of the time. She states she is walking for 20 to 30 minutes 4 times per week. Mylan indulged yesterday. She went to [place] and indulged, even though she planned to bring her breakfast and lunch. Her weight is 228 lb (103.4 kg) today and has had a weight loss of 4 pounds over a period of 2 to 3 weeks since her last visit. She has lost 21 lbs since starting treatment with [name].  Vitamin D deficiency Candace [name] has a diagnosis of vitamin D deficiency. She is currently taking vit D and denies nausea, vomiting or muscle weakness.  Pre-Diabetes Candace [name] has a diagnosis of pre-diabetes based on her elevated Hgb A1c and was informed this puts her at greater risk of developing diabetes. She is taking metformin currently and continues to work on diet and exercise to decrease risk of diabetes. She denies any GI symptoms, even with indulgences.  Depression with emotional eating behaviors Candace [name]'s blood pressure is slightly elevated and patient is agitated. Candace [name] struggles with emotional eating and using food for comfort to the extent that it is negatively impacting her health. She often snacks when she is not hungry. Candace [name] sometimes feels she is out of control and then feels guilty that she made poor food choices. She has been working on behavior modification techniques to help reduce her emotional eating and has been somewhat successful. She shows no sign of suicidal or homicidal ideations.  Depression screen [facility] 2/9 09/17/2017 10/08/2016  Decreased Interest 3 0  Down, Depressed, Hopeless 3 1  PHQ - 2 Score 6 1  Altered sleeping 2 -  Tired, decreased energy 3 -  Change in appetite 3 -  Feeling bad or failure about yourself  3 -  Trouble concentrating 1 -  Moving slowly or  fidgety/restless 0 -  Suicidal thoughts 0 -  PHQ-9 Score 18 -  Difficult doing work/chores Very difficult -     ALLERGIES: No Known Allergies  MEDICATIONS: Current Outpatient Medications on File Prior to Visit  Medication Sig Dispense Refill  . anastrozole (ARIMIDEX) 1 MG tablet Take 1 tablet (1 mg total) by mouth daily. 90 tablet 4  . buPROPion (WELLBUTRIN XL) 300 MG 24 hr tablet Take 300 mg by mouth daily.     . cetirizine (ZYRTEC) 10 MG tablet Take 10 mg by mouth daily. [facility]    . esomeprazole (NEXIUM) 40 MG capsule Take 40 mg by mouth every 36 (thirty-six) hours.    [name] FLUoxetine (PROZAC) 10 MG capsule Take 10 mg by mouth daily with lunch.     . metFORMIN (GLUCOPHAGE) 500 MG tablet Take 1 tablet (500 mg total) by mouth daily with breakfast. 30 tablet 0  . Multiple Vitamin (MULTIVITAMIN WITH MINERALS) TABS tablet Take 1 tablet by mouth daily.    [name] rOPINIRole (REQUIP) 0.25 MG tablet Take 0.5 mg by mouth daily at 8 pm.    . rosuvastatin (CRESTOR) 10 MG tablet Take 10 mg by mouth at bedtime.    . Vitamin D, Ergocalciferol, (DRISDOL) 50000 units CAPS capsule Take 1 capsule (50,000 Units total) by mouth every 7 (seven) days. 4 capsule 0   No current facility-administered medications on file prior to visit.     PAST MEDICAL HISTORY: Past Medical  History:  Diagnosis Date  . Abnormal Pap smear   . Alcohol abuse   . Anovulation   . Anxiety   . Breast cancer ([place]) 04/05/2016   right breast  . Cancer ([place]) 2017   right breast cancer  . Complication of anesthesia    slow to wake up  . Depression   . Endometrial hyperplasia   . Endometrial polyp    h/o  . Epidermal cyst    h/o  . Gallbladder problem   . GERD (gastroesophageal reflux disease)   . H/O hemorrhoids   . H/O menorrhagia   . High risk HPV infection   . Hyperlipidemia   . Insomnia   . Obesity   . Oligomenorrhea   . Prediabetes   . Restless legs syndrome   . Seasonal allergies   .  Vitamin D deficiency     PAST SURGICAL HISTORY: Past Surgical History:  Procedure Laterality Date  . BREAST LUMPECTOMY Right 05/2016   radiation  . BREAST LUMPECTOMY WITH RADIOACTIVE SEED AND SENTINEL LYMPH NODE BIOPSY Right 05/28/2016   Procedure: RADIOACTIVE SEED GUIDED RIGHT BREAST LUMPECTOMY WITH  RIGHT AXILLARY SENTINEL LYMPH NODE BIOPSY;  Surgeon: [name], MD;  Location: [place];  Service: General;  Laterality: Right;  . CARPAL TUNNEL RELEASE  03/26/2011   right hand  . CARPAL TUNNEL RELEASE  05/28/2011   Procedure: CARPAL TUNNEL RELEASE;  Surgeon: [name];  Location: [facility];  Service: Orthopedics;  Laterality: Left;  . CHOLECYSTECTOMY N/A 01/24/2013   Procedure: LAPAROSCOPIC CHOLECYSTECTOMY WITH INTRAOPERATIVE CHOLANGIOGRAM;  Surgeon: [name], MD;  Location: [place];  Service: General;  Laterality: N/A;  . COLONOSCOPY    . HYSTEROSCOPY    . POLYPECTOMY    . TONSILLECTOMY  1967   as child  . trigger fingers  8 yrs. ago   both done months apart  . uterine ablation  06/04/2010  . uterine ablation      SOCIAL HISTORY: Social History   Tobacco Use  . Smoking status: Never Smoker  . Smokeless tobacco: Never Used  Substance Use Topics  . Alcohol use: No    Comment: none since 1996  . Drug use: No    FAMILY HISTORY: Family History  Problem Relation Age of Onset  . Heart disease Mother   . Hypertension Mother   . Stroke Mother   . Dementia Mother        vascular dementia  . Hyperlipidemia Mother   . Depression Mother   . Anxiety disorder Mother   . Alcohol abuse Mother   . Obesity Mother   . Pulmonary fibrosis Father        d. 86  . Depression Father   . Anxiety disorder Father   . Alcohol abuse Father   . Heart attack Maternal Grandmother        d. [phone]  . Heart disease Maternal Grandmother   . Heart disease Maternal Grandfather        d. 60-63  . Leukemia Paternal Grandmother        d. late 22s  . Breast cancer Sister 94         IDC s/p mastectomy and chemo    ROS: Review of Systems  Constitutional: Positive for weight loss.  Gastrointestinal: Negative for diarrhea, nausea and vomiting.  Musculoskeletal:       Negative for muscle weakness  Psychiatric/Behavioral: Positive for depression. Negative for suicidal ideas.    PHYSICAL EXAM: Blood pressure (!) 141/87, pulse  67, temperature 97.9 F (36.6 C), temperature source Oral, height 5\' 7"  (1.702 m), weight 228 lb (103.4 kg), SpO2 98 %. Body mass index is 35.71 kg/m. Physical Exam  Constitutional: She is oriented to person, place, and time. She appears well-developed and well-nourished.  Cardiovascular: Normal rate.  Pulmonary/Chest: Effort normal.  Musculoskeletal: Normal range of motion.  Neurological: She is oriented to person, place, and time.  Skin: Skin is warm and dry.  Psychiatric: She is agitated.  Vitals reviewed.   RECENT LABS AND TESTS: BMET    Component Value Date/Time   NA 142 09/17/2017 1140   NA 141 04/16/2017 1409   K 4.4 09/17/2017 1140   K 3.8 04/16/2017 1409   CL 102 09/17/2017 1140   CO2 25 09/17/2017 1140   CO2 25 04/16/2017 1409   GLUCOSE 100 (H) 09/17/2017 1140   GLUCOSE 134 04/16/2017 1409   BUN 11 09/17/2017 1140   BUN 12.4 04/16/2017 1409   CREATININE 0.76 09/17/2017 1140   CREATININE 0.9 04/16/2017 1409   CALCIUM 10.0 09/17/2017 1140   CALCIUM 9.6 04/16/2017 1409   GFRNONAA 88 09/17/2017 1140   GFRAA 101 09/17/2017 1140   Lab Results  Component Value Date   HGBA1C 6.2 (H) 09/17/2017   Lab Results  Component Value Date   INSULIN 24.2 09/17/2017   CBC    Component Value Date/Time   WBC 5.7 04/16/2017 1409   WBC 7.3 05/22/2016 1411   RBC 4.88 04/16/2017 1409   RBC 4.93 05/22/2016 1411   HGB 13.7 04/16/2017 1409   HCT 41.2 04/16/2017 1409   PLT 259 04/16/2017 1409   MCV 84.4 04/16/2017 1409   MCH 28.1 04/16/2017 1409   MCH 28.8 05/22/2016 1411   MCHC 33.3 04/16/2017 1409   MCHC 33.6  05/22/2016 1411   RDW 13.2 04/16/2017 1409   LYMPHSABS 1.9 04/16/2017 1409   MONOABS 0.6 04/16/2017 1409   EOSABS 0.1 04/16/2017 1409   BASOSABS 0.0 04/16/2017 1409   Iron/TIBC/Ferritin/ %Sat No results found for: IRON, TIBC, FERRITIN, IRONPCTSAT Lipid Panel  No results found for: CHOL, TRIG, HDL, CHOLHDL, VLDL, LDLCALC, LDLDIRECT Hepatic Function Panel     Component Value Date/Time   PROT 6.9 09/17/2017 1140   PROT 7.0 04/16/2017 1409   ALBUMIN 4.5 09/17/2017 1140   ALBUMIN 4.0 04/16/2017 1409   AST 35 09/17/2017 1140   AST 29 04/16/2017 1409   ALT 45 (H) 09/17/2017 1140   ALT 34 04/16/2017 1409   ALKPHOS 91 09/17/2017 1140   ALKPHOS 74 04/16/2017 1409   BILITOT 0.3 09/17/2017 1140   BILITOT 0.40 04/16/2017 1409      Component Value Date/Time   TSH 2.390 09/17/2017 1140    ASSESSMENT AND PLAN: Vitamin D deficiency  Prediabetes  Other depression - with emotional eating   Class 2 severe obesity with serious comorbidity and body mass index (BMI) of 35.0 to 35.9 in adult, unspecified obesity type (HCC)  PLAN:  Vitamin D Deficiency Candace [name] was informed that low vitamin D levels contributes to fatigue and are associated with obesity, breast, and colon cancer. She agrees to continue to take prescription Vit D @50 ,000 IU every week (no refill needed) and will follow up for routine testing of vitamin D, at least 2-3 times per year. She was informed of the risk of over-replacement of vitamin D and agrees to not increase her dose unless she discusses this with [name] first.  Pre-Diabetes Candace [name] will continue to work on weight loss, exercise, and decreasing  simple carbohydrates in her diet to help decrease the risk of diabetes. We dicussed metformin including benefits and risks. She was informed that eating too many simple carbohydrates or too many calories at one sitting increases the likelihood of GI side effects. Irys agrees to continue metformin 500 mg daily and follow up with [name]  as directed to monitor her progress.  Depression with Emotional Eating Behaviors We discussed behavior modification techniques today to help Candace [name] deal with her emotional eating and depression. She will continue Wellbutrin as prescribed and we will follow up blood pressure at the next visit.   Obesity Candace [name] is currently in the action stage of change. As such, her goal is to continue with weight loss efforts She has agreed to follow the keep a food journal with 500 to 650 calories and 40 grams of protein at supper daily and follow the Category 4 plan Candace [name] has been instructed to work up to a goal of 150 minutes of combined cardio and strengthening exercise per week for weight loss and overall health benefits. We discussed the following Behavioral Modification Strategies today: planning for success, increasing lean protein intake, increasing vegetables and work on meal planning and easy cooking plans  Candace [name] has agreed to follow up with our clinic in 2 weeks. She was informed of the importance of frequent follow up visits to maximize her success with intensive lifestyle modifications for her multiple health conditions.   OBESITY BEHAVIORAL INTERVENTION VISIT  Today's visit was # 4 out of 22.  Starting weight: 249 lbs Starting date: 09/17/17 Today's weight : 228 lbs Today's date: 11/04/2017 Total lbs lost to date:  (Patients must lose 7 lbs in the first 6 months to continue with counseling)   ASK: We discussed the diagnosis of obesity with Candace [name] today and Candace [name] agreed to give [name] permission to discuss obesity behavioral modification therapy today.  ASSESS: Candace [name] has the diagnosis of obesity and her BMI today is 35.7 Candace [name] is in the action stage of change   ADVISE: Candace [name] was educated on the multiple health risks of obesity as well as the benefit of weight loss to improve her health. She was advised of the need for long term treatment and the importance of lifestyle  modifications.  AGREE: Multiple dietary modification options and treatment options were discussed and  Candace [name] agreed to the above obesity treatment plan.  I, Doreene Nest, am acting as transcriptionist for Candace Jones, MD  I have reviewed the above documentation for accuracy and completeness, and I agree with the above. - Candace Qua, MD

## 2017-11-11 ENCOUNTER — Inpatient Hospital Stay: Payer: 59 | Attending: Oncology

## 2017-11-11 ENCOUNTER — Telehealth: Payer: Self-pay | Admitting: Oncology

## 2017-11-11 ENCOUNTER — Inpatient Hospital Stay (HOSPITAL_BASED_OUTPATIENT_CLINIC_OR_DEPARTMENT_OTHER): Payer: 59 | Admitting: Oncology

## 2017-11-11 VITALS — BP 123/81 | HR 75 | Temp 98.7°F | Resp 18 | Ht 67.0 in | Wt 229.1 lb

## 2017-11-11 DIAGNOSIS — C50411 Malignant neoplasm of upper-outer quadrant of right female breast: Secondary | ICD-10-CM | POA: Diagnosis not present

## 2017-11-11 DIAGNOSIS — Z79811 Long term (current) use of aromatase inhibitors: Secondary | ICD-10-CM

## 2017-11-11 DIAGNOSIS — Z170 Estrogen receptor positive status [ER+]: Secondary | ICD-10-CM | POA: Insufficient documentation

## 2017-11-11 DIAGNOSIS — Z923 Personal history of irradiation: Secondary | ICD-10-CM

## 2017-11-11 DIAGNOSIS — R634 Abnormal weight loss: Secondary | ICD-10-CM

## 2017-11-11 DIAGNOSIS — F329 Major depressive disorder, single episode, unspecified: Secondary | ICD-10-CM | POA: Insufficient documentation

## 2017-11-11 LAB — CBC WITH DIFFERENTIAL/PLATELET
Basophils Absolute: 0 10*3/uL (ref 0.0–0.1)
Basophils Relative: 1 %
EOS ABS: 0.3 10*3/uL (ref 0.0–0.5)
EOS PCT: 4 %
HCT: 42.2 % (ref 34.8–46.6)
Hemoglobin: 14.2 g/dL (ref 11.6–15.9)
LYMPHS ABS: 1.7 10*3/uL (ref 0.9–3.3)
LYMPHS PCT: 26 %
MCH: 28.2 pg (ref 25.1–34.0)
MCHC: 33.5 g/dL (ref 31.5–36.0)
MCV: 84.1 fL (ref 79.5–101.0)
Monocytes Absolute: 0.8 10*3/uL (ref 0.1–0.9)
Monocytes Relative: 11 %
Neutro Abs: 3.9 10*3/uL (ref 1.5–6.5)
Neutrophils Relative %: 58 %
PLATELETS: 248 10*3/uL (ref 145–400)
RBC: 5.02 MIL/uL (ref 3.70–5.45)
RDW: 13.1 % (ref 11.2–14.5)
WBC: 6.8 10*3/uL (ref 3.9–10.3)

## 2017-11-11 LAB — COMPREHENSIVE METABOLIC PANEL
ALBUMIN: 4.2 g/dL (ref 3.5–5.0)
ALT: 21 U/L (ref 0–55)
AST: 17 U/L (ref 5–34)
Alkaline Phosphatase: 104 U/L (ref 40–150)
Anion gap: 7 (ref 3–11)
BILIRUBIN TOTAL: 0.4 mg/dL (ref 0.2–1.2)
BUN: 19 mg/dL (ref 7–26)
CHLORIDE: 104 mmol/L (ref 98–109)
CO2: 28 mmol/L (ref 22–29)
CREATININE: 0.84 mg/dL (ref 0.60–1.10)
Calcium: 10.2 mg/dL (ref 8.4–10.4)
GFR calc Af Amer: >60 mL/min (ref >60)
GLUCOSE: 79 mg/dL (ref 70–140)
POTASSIUM: 3.9 mmol/L (ref 3.5–5.1)
Sodium: 139 mmol/L (ref 136–145)
TOTAL PROTEIN: 7.2 g/dL (ref 6.4–8.3)

## 2017-11-11 NOTE — Telephone Encounter (Signed)
Scheduled appts per 5/14 los - pt did not want avs and calendar.

## 2017-11-12 ENCOUNTER — Other Ambulatory Visit: Payer: Self-pay | Admitting: Oncology

## 2017-11-12 MED FILL — ANASTROZOLE 1 MG TABLET: 1 | 90 days supply | Qty: 90 | Fill #0

## 2017-11-19 ENCOUNTER — Ambulatory Visit (INDEPENDENT_AMBULATORY_CARE_PROVIDER_SITE_OTHER): Payer: 59 | Admitting: Family Medicine

## 2017-11-19 VITALS — BP 144/84 | HR 76 | Temp 97.8°F | Ht 67.0 in | Wt 225.0 lb

## 2017-11-19 DIAGNOSIS — Z6835 Body mass index (BMI) 35.0-35.9, adult: Secondary | ICD-10-CM

## 2017-11-19 DIAGNOSIS — E559 Vitamin D deficiency, unspecified: Secondary | ICD-10-CM | POA: Diagnosis not present

## 2017-11-19 DIAGNOSIS — Z9189 Other specified personal risk factors, not elsewhere classified: Secondary | ICD-10-CM

## 2017-11-19 DIAGNOSIS — R7303 Prediabetes: Secondary | ICD-10-CM

## 2017-11-19 MED ORDER — METFORMIN HCL 500 MG PO TABS: 500.0000 mg | ORAL_TABLET | Freq: Every day | ORAL | 0 refills | Status: DC

## 2017-11-19 MED ORDER — VITAMIN D (ERGOCALCIFEROL) 1.25 MG (50000 UNIT) PO CAPS: 50000.0000 [IU] | ORAL_CAPSULE | ORAL | 0 refills | Status: DC

## 2017-11-19 NOTE — Progress Notes (Signed)
Office: [phone]  /  Fax: [phone]   HPI:   Chief Complaint: OBESITY Candace [name] is here to discuss her progress with her obesity treatment plan. She is on the keep a food journal with 500-650 calories and 40 grams of protein at supper daily and follow the Category 4 plan and is following her eating plan approximately 85 % of the time. She states she is walking for 30 minutes 4 times per week. Amel enjoyed making recipes. Liked journaling. Finds protein heavy snacks to decrease hunger at night. This weekend, going to [place] for soccer tournament.  Her weight is 225 lb (102.1 kg) today and has had a weight loss of 3 pounds over a period of 2 weeks since her last visit. She has lost 24 lbs since starting treatment with [name].  Vitamin D Deficiency Candace [name] has a diagnosis of vitamin D deficiency. She is currently taking prescription Vit D. She notes fatigue and denies nausea, vomiting or muscle weakness.  Pre-Diabetes Candace [name] has a diagnosis of pre-diabetes based on her elevated Hgb A1c and was informed this puts her at greater risk of developing diabetes. She is taking metformin currently and continues to work on diet and exercise to decrease risk of diabetes. She denies nausea or hypoglycemia.  At risk for diabetes Candace [name] is at higher than average risk for developing diabetes due to her obesity and pre-diabetes. She currently denies polyuria or polydipsia.  ALLERGIES: No Known Allergies  MEDICATIONS: Current Outpatient Medications on File Prior to Visit  Medication Sig Dispense Refill  . anastrozole (ARIMIDEX) 1 MG tablet TAKE 1 TABLET (1 MG TOTAL) BY MOUTH DAILY. 90 tablet 4  . buPROPion (WELLBUTRIN XL) 300 MG 24 hr tablet Take 300 mg by mouth daily.     . cetirizine (ZYRTEC) 10 MG tablet Take 10 mg by mouth daily. [facility]    . esomeprazole (NEXIUM) 40 MG capsule Take 40 mg by mouth every 36 (thirty-six) hours.    [name] FLUoxetine (PROZAC) 10 MG capsule Take  10 mg by mouth daily with lunch.     . metFORMIN (GLUCOPHAGE) 500 MG tablet Take 1 tablet (500 mg total) by mouth daily with breakfast. 30 tablet 0  . Multiple Vitamin (MULTIVITAMIN WITH MINERALS) TABS tablet Take 1 tablet by mouth daily.    [name] rOPINIRole (REQUIP) 0.25 MG tablet Take 0.5 mg by mouth daily at 8 pm.    . rosuvastatin (CRESTOR) 10 MG tablet Take 10 mg by mouth at bedtime.    . Vitamin D, Ergocalciferol, (DRISDOL) 50000 units CAPS capsule Take 1 capsule (50,000 Units total) by mouth every 7 (seven) days. 4 capsule 0   No current facility-administered medications on file prior to visit.     PAST MEDICAL HISTORY: Past Medical History:  Diagnosis Date  . Abnormal Pap smear   . Alcohol abuse   . Anovulation   . Anxiety   . Breast cancer ([place]) 04/05/2016   right breast  . Cancer ([place]) 2017   right breast cancer  . Complication of anesthesia    slow to wake up  . Depression   . Endometrial hyperplasia   . Endometrial polyp    h/o  . Epidermal cyst    h/o  . Gallbladder problem   . GERD (gastroesophageal reflux disease)   . H/O hemorrhoids   . H/O menorrhagia   . High risk HPV infection   . Hyperlipidemia   . Insomnia   . Obesity   . Oligomenorrhea   .  Prediabetes   . Restless legs syndrome   . Seasonal allergies   . Vitamin D deficiency     PAST SURGICAL HISTORY: Past Surgical History:  Procedure Laterality Date  . BREAST LUMPECTOMY Right 05/2016   radiation  . BREAST LUMPECTOMY WITH RADIOACTIVE SEED AND SENTINEL LYMPH NODE BIOPSY Right 05/28/2016   Procedure: RADIOACTIVE SEED GUIDED RIGHT BREAST LUMPECTOMY WITH  RIGHT AXILLARY SENTINEL LYMPH NODE BIOPSY;  Surgeon: [name], MD;  Location: [facility];  Service: General;  Laterality: Right;  . CARPAL TUNNEL RELEASE  03/26/2011   right hand  . CARPAL TUNNEL RELEASE  05/28/2011   Procedure: CARPAL TUNNEL RELEASE;  Surgeon: [name];  Location: [facility];  Service: Orthopedics;  Laterality: Left;   . CHOLECYSTECTOMY N/A 01/24/2013   Procedure: LAPAROSCOPIC CHOLECYSTECTOMY WITH INTRAOPERATIVE CHOLANGIOGRAM;  Surgeon: [name], MD;  Location: [place];  Service: General;  Laterality: N/A;  . COLONOSCOPY    . HYSTEROSCOPY    . POLYPECTOMY    . TONSILLECTOMY  1967   as child  . trigger fingers  8 yrs. ago   both done months apart  . uterine ablation  06/04/2010  . uterine ablation      SOCIAL HISTORY: Social History   Tobacco Use  . Smoking status: Never Smoker  . Smokeless tobacco: Never Used  Substance Use Topics  . Alcohol use: No    Comment: none since 1996  . Drug use: No    FAMILY HISTORY: Family History  Problem Relation Age of Onset  . Heart disease Mother   . Hypertension Mother   . Stroke Mother   . Dementia Mother        vascular dementia  . Hyperlipidemia Mother   . Depression Mother   . Anxiety disorder Mother   . Alcohol abuse Mother   . Obesity Mother   . Pulmonary fibrosis Father        d. 25  . Depression Father   . Anxiety disorder Father   . Alcohol abuse Father   . Heart attack Maternal Grandmother        d. [phone]  . Heart disease Maternal Grandmother   . Heart disease Maternal Grandfather        d. 60-63  . Leukemia Paternal Grandmother        d. late 90s  . Breast cancer Sister 8       IDC s/p mastectomy and chemo    ROS: Review of Systems  Constitutional: Positive for malaise/fatigue and weight loss.  Gastrointestinal: Negative for nausea and vomiting.  Genitourinary: Negative for frequency.  Musculoskeletal:       Negative muscle weakness  Endo/Heme/Allergies: Negative for polydipsia.       Negative hypoglycemia    PHYSICAL EXAM: Blood pressure (!) 144/84, pulse 76, temperature 97.8 F (36.6 C), temperature source Oral, height 5\' 7"  (1.702 m), weight 225 lb (102.1 kg), SpO2 98 %. Body mass index is 35.24 kg/m. Physical Exam  Constitutional: She is oriented to person, place, and time. She appears well-developed  and well-nourished.  Cardiovascular: Normal rate.  Pulmonary/Chest: Effort normal.  Musculoskeletal: Normal range of motion.  Neurological: She is oriented to person, place, and time.  Skin: Skin is warm and dry.  Psychiatric: She has a normal mood and affect. Her behavior is normal.  Vitals reviewed.   RECENT LABS AND TESTS: BMET    Component Value Date/Time   NA 139 11/11/2017 1127   NA 142 09/17/2017 1140  NA 141 04/16/2017 1409   K 3.9 11/11/2017 1127   K 3.8 04/16/2017 1409   CL 104 11/11/2017 1127   CO2 28 11/11/2017 1127   CO2 25 04/16/2017 1409   GLUCOSE 79 11/11/2017 1127   GLUCOSE 134 04/16/2017 1409   BUN 19 11/11/2017 1127   BUN 11 09/17/2017 1140   BUN 12.4 04/16/2017 1409   CREATININE 0.84 11/11/2017 1127   CREATININE 0.9 04/16/2017 1409   CALCIUM 10.2 11/11/2017 1127   CALCIUM 9.6 04/16/2017 1409   GFRNONAA >60 11/11/2017 1127   GFRAA >60 11/11/2017 1127   Lab Results  Component Value Date   HGBA1C 6.2 (H) 09/17/2017   Lab Results  Component Value Date   INSULIN 24.2 09/17/2017   CBC    Component Value Date/Time   WBC 6.8 11/11/2017 1127   RBC 5.02 11/11/2017 1127   HGB 14.2 11/11/2017 1127   HGB 13.7 04/16/2017 1409   HCT 42.2 11/11/2017 1127   HCT 41.2 04/16/2017 1409   PLT 248 11/11/2017 1127   PLT 259 04/16/2017 1409   MCV 84.1 11/11/2017 1127   MCV 84.4 04/16/2017 1409   MCH 28.2 11/11/2017 1127   MCHC 33.5 11/11/2017 1127   RDW 13.1 11/11/2017 1127   RDW 13.2 04/16/2017 1409   LYMPHSABS 1.7 11/11/2017 1127   LYMPHSABS 1.9 04/16/2017 1409   MONOABS 0.8 11/11/2017 1127   MONOABS 0.6 04/16/2017 1409   EOSABS 0.3 11/11/2017 1127   EOSABS 0.1 04/16/2017 1409   BASOSABS 0.0 11/11/2017 1127   BASOSABS 0.0 04/16/2017 1409   Iron/TIBC/Ferritin/ %Sat No results found for: IRON, TIBC, FERRITIN, IRONPCTSAT Lipid Panel  No results found for: CHOL, TRIG, HDL, CHOLHDL, VLDL, LDLCALC, LDLDIRECT Hepatic Function Panel     Component Value  Date/Time   PROT 7.2 11/11/2017 1127   PROT 6.9 09/17/2017 1140   PROT 7.0 04/16/2017 1409   ALBUMIN 4.2 11/11/2017 1127   ALBUMIN 4.5 09/17/2017 1140   ALBUMIN 4.0 04/16/2017 1409   AST 17 11/11/2017 1127   AST 29 04/16/2017 1409   ALT 21 11/11/2017 1127   ALT 34 04/16/2017 1409   ALKPHOS 104 11/11/2017 1127   ALKPHOS 74 04/16/2017 1409   BILITOT 0.4 11/11/2017 1127   BILITOT 0.3 09/17/2017 1140   BILITOT 0.40 04/16/2017 1409      Component Value Date/Time   TSH 2.390 09/17/2017 1140    ASSESSMENT AND PLAN: Vitamin D deficiency - Plan: Vitamin D, Ergocalciferol, (DRISDOL) 50000 units CAPS capsule  Prediabetes - Plan: metFORMIN (GLUCOPHAGE) 500 MG tablet  At risk for diabetes mellitus  Class 2 severe obesity with serious comorbidity and body mass index (BMI) of 35.0 to 35.9 in adult, unspecified obesity type (HCC)  PLAN:  Vitamin D Deficiency Kiyona was informed that low vitamin D levels contributes to fatigue and are associated with obesity, breast, and colon cancer. Candace [name] agrees to continue taking prescription Vit D @50 ,000 IU every week #4 and we will refill for 1 month. She will follow up for routine testing of vitamin D, at least 2-3 times per year. She was informed of the risk of over-replacement of vitamin D and agrees to not increase her dose unless she discusses this with [name] first. Candace [name] agrees to follow up with our clinic in 2 weeks.  Pre-Diabetes Candace [name] will continue to work on weight loss, exercise, and decreasing simple carbohydrates in her diet to help decrease the risk of diabetes. We dicussed metformin including benefits and risks. She was informed that  eating too many simple carbohydrates or too many calories at one sitting increases the likelihood of GI side effects. Candace [name] agrees to continue taking metformin 500 mg PO q AM #30 and we will refill for 1 month. Candace [name] agrees to follow up with our clinic in 2 weeks as directed to monitor her progress.  Diabetes  risk counselling Candace [name] was given extended (15 minutes) diabetes prevention counseling today. She is 57 y.o. female and has risk factors for diabetes including obesity and pre-diabetes. We discussed intensive lifestyle modifications today with an emphasis on weight loss as well as increasing exercise and decreasing simple carbohydrates in her diet.  Obesity Candace [name] is currently in the action stage of change. As such, her goal is to continue with weight loss efforts She has agreed to keep a food journal with 500-650 calories and 40 grams of protein at supper daily and follow the Category 4 plan Advika has been instructed to work up to a goal of 150 minutes of combined cardio and strengthening exercise per week for weight loss and overall health benefits. We discussed the following Behavioral Modification Strategies today: increasing lean protein intake, increasing vegetables, work on meal planning and easy cooking plans, better snacking choices, and planning for success   Candace [name] has agreed to follow up with our clinic in 2 weeks. She was informed of the importance of frequent follow up visits to maximize her success with intensive lifestyle modifications for her multiple health conditions.   OBESITY BEHAVIORAL INTERVENTION VISIT  Today's visit was # 5 out of 22.  Starting weight: 249 lbs Starting date: 09/17/17 Today's weight : 225 lbs Today's date: 11/19/2017 Total lbs lost to date: 24 (Patients must lose 7 lbs in the first 6 months to continue with counseling)   ASK: We discussed the diagnosis of obesity with Candace [name] today and Candace [name] agreed to give [name] permission to discuss obesity behavioral modification therapy today.  ASSESS: Evalie has the diagnosis of obesity and her BMI today is 35.23 Jailynn is in the action stage of change   ADVISE: Candace [name] was educated on the multiple health risks of obesity as well as the benefit of weight loss to improve her health. She was advised of the  need for long term treatment and the importance of lifestyle modifications.  AGREE: Multiple dietary modification options and treatment options were discussed and  Candace [name] agreed to the above obesity treatment plan.  I, Trixie Dredge, am acting as transcriptionist for Ilene Qua, MD  I have reviewed the above documentation for accuracy and completeness, and I agree with the above. - Ilene Qua, MD

## 2017-11-29 ENCOUNTER — Encounter (INDEPENDENT_AMBULATORY_CARE_PROVIDER_SITE_OTHER): Payer: Self-pay | Admitting: Family Medicine

## 2017-12-04 MED FILL — ROSUVASTATIN CALCIUM 10 MG: 10 | 90 days supply | Qty: 90 | Fill #0

## 2017-12-04 MED FILL — metFORMIN HCL 500 MG TABS: 500 | 30 days supply | Qty: 30 | Fill #0

## 2017-12-04 MED FILL — VIT D2 1.25 MG (50,000 UNIT: 1.25 MG | 28 days supply | Qty: 4 | Fill #0

## 2017-12-11 ENCOUNTER — Encounter (INDEPENDENT_AMBULATORY_CARE_PROVIDER_SITE_OTHER): Payer: Self-pay

## 2017-12-11 ENCOUNTER — Ambulatory Visit (INDEPENDENT_AMBULATORY_CARE_PROVIDER_SITE_OTHER): Payer: 59 | Admitting: Family Medicine

## 2017-12-17 ENCOUNTER — Ambulatory Visit (INDEPENDENT_AMBULATORY_CARE_PROVIDER_SITE_OTHER): Payer: 59 | Admitting: Physician Assistant

## 2017-12-17 VITALS — BP 144/90 | HR 88 | Temp 98.6°F | Ht 67.0 in | Wt 221.0 lb

## 2017-12-17 DIAGNOSIS — Z6834 Body mass index (BMI) 34.0-34.9, adult: Secondary | ICD-10-CM

## 2017-12-17 DIAGNOSIS — I10 Essential (primary) hypertension: Secondary | ICD-10-CM

## 2017-12-17 DIAGNOSIS — E669 Obesity, unspecified: Secondary | ICD-10-CM

## 2017-12-17 NOTE — Progress Notes (Signed)
Office: [phone]  /  Fax: [phone]   HPI:   Chief Complaint: OBESITY Candace [name] is here to discuss her progress with her obesity treatment plan. She is on the keep a food journal with 500-650 calories and 40 grams of protein at supper daily and follow the Category 4 plan and is following her eating plan approximately 75 % of the time. She states she is walking for 30 minutes 4 times per week. Candace [name] continues to do well with weight loss. She has had increase in cravings. Also, will be going out of town and would like travel eating strategies.  Her weight is 221 lb (100.2 kg) today and has had a weight loss of 4 pounds over a period of 4 weeks since her last visit. She has lost 28 lbs since starting treatment with [name].  Hypertension Candace [name] is a 57 y.o. female with hypertension. Candace [name]'s blood pressure is elevated, has been elevated on multiple office visits. She is not on medications, declines any medications today. She denies chest pain or shortness of breath. She is working weight loss to help control her blood pressure with the goal of decreasing her risk of heart attack and stroke. Candace [name]'s blood pressure is not currently controlled.  ALLERGIES: No Known Allergies  MEDICATIONS: Current Outpatient Medications on File Prior to Visit  Medication Sig Dispense Refill  . anastrozole (ARIMIDEX) 1 MG tablet TAKE 1 TABLET (1 MG TOTAL) BY MOUTH DAILY. 90 tablet 4  . buPROPion (WELLBUTRIN XL) 300 MG 24 hr tablet Take 300 mg by mouth daily.     . cetirizine (ZYRTEC) 10 MG tablet Take 10 mg by mouth daily. [facility]    . esomeprazole (NEXIUM) 40 MG capsule Take 40 mg by mouth every 36 (thirty-six) hours.    [name] FLUoxetine (PROZAC) 10 MG capsule Take 10 mg by mouth daily with lunch.     . metFORMIN (GLUCOPHAGE) 500 MG tablet Take 1 tablet (500 mg total) by mouth daily with breakfast. 30 tablet 0  . Multiple Vitamin (MULTIVITAMIN WITH MINERALS) TABS tablet Take 1 tablet  by mouth daily.    [name] rOPINIRole (REQUIP) 0.25 MG tablet Take 0.5 mg by mouth daily at 8 pm.    . rosuvastatin (CRESTOR) 10 MG tablet Take 10 mg by mouth at bedtime.    . Vitamin D, Ergocalciferol, (DRISDOL) 50000 units CAPS capsule Take 1 capsule (50,000 Units total) by mouth every 7 (seven) days. 4 capsule 0   No current facility-administered medications on file prior to visit.     PAST MEDICAL HISTORY: Past Medical History:  Diagnosis Date  . Abnormal Pap smear   . Alcohol abuse   . Anovulation   . Anxiety   . Breast cancer ([place]) 04/05/2016   right breast  . Cancer ([place]) 2017   right breast cancer  . Complication of anesthesia    slow to wake up  . Depression   . Endometrial hyperplasia   . Endometrial polyp    h/o  . Epidermal cyst    h/o  . Gallbladder problem   . GERD (gastroesophageal reflux disease)   . H/O hemorrhoids   . H/O menorrhagia   . High risk HPV infection   . Hyperlipidemia   . Insomnia   . Obesity   . Oligomenorrhea   . Prediabetes   . Restless legs syndrome   . Seasonal allergies   . Vitamin D deficiency     PAST SURGICAL HISTORY: Past Surgical History:  Procedure  Laterality Date  . BREAST LUMPECTOMY Right 05/2016   radiation  . BREAST LUMPECTOMY WITH RADIOACTIVE SEED AND SENTINEL LYMPH NODE BIOPSY Right 05/28/2016   Procedure: RADIOACTIVE SEED GUIDED RIGHT BREAST LUMPECTOMY WITH  RIGHT AXILLARY SENTINEL LYMPH NODE BIOPSY;  Surgeon: [name], MD;  Location: [facility];  Service: General;  Laterality: Right;  . CARPAL TUNNEL RELEASE  03/26/2011   right hand  . CARPAL TUNNEL RELEASE  05/28/2011   Procedure: CARPAL TUNNEL RELEASE;  Surgeon: [name];  Location[facility];  Service: Orthopedics;  Laterality: Left;  . CHOLECYSTECTOMY N/A 01/24/2013   Procedure: LAPAROSCOPIC CHOLECYSTECTOMY WITH INTRAOPERATIVE CHOLANGIOGRAM;  Surgeon: [name], MD;  Location: [facility];  Service: General;  Laterality: N/A;  . COLONOSCOPY    .  HYSTEROSCOPY    . POLYPECTOMY    . TONSILLECTOMY  1967   as child  . trigger fingers  8 yrs. ago   both done months apart  . uterine ablation  06/04/2010  . uterine ablation      SOCIAL HISTORY: Social History   Tobacco Use  . Smoking status: Never Smoker  . Smokeless tobacco: Never Used  Substance Use Topics  . Alcohol use: No    Comment: none since 1996  . Drug use: No    FAMILY HISTORY: Family History  Problem Relation Age of Onset  . Heart disease Mother   . Hypertension Mother   . Stroke Mother   . Dementia Mother        vascular dementia  . Hyperlipidemia Mother   . Depression Mother   . Anxiety disorder Mother   . Alcohol abuse Mother   . Obesity Mother   . Pulmonary fibrosis Father        d. 36  . Depression Father   . Anxiety disorder Father   . Alcohol abuse Father   . Heart attack Maternal Grandmother        d. [phone]  . Heart disease Maternal Grandmother   . Heart disease Maternal Grandfather        d. 60-63  . Leukemia Paternal Grandmother        d. late 47s  . Breast cancer Sister 63       IDC s/p mastectomy and chemo    ROS: Review of Systems  Constitutional: Positive for weight loss.  Respiratory: Negative for shortness of breath.   Cardiovascular: Negative for chest pain.    PHYSICAL EXAM: Blood pressure (!) 144/90, pulse 88, temperature 98.6 F (37 C), temperature source Oral, height 5\' 7"  (1.702 m), weight 221 lb (100.2 kg), SpO2 96 %. Body mass index is 34.61 kg/m. Physical Exam  Constitutional: She is oriented to person, place, and time. She appears well-developed and well-nourished.  Cardiovascular: Normal rate.  Pulmonary/Chest: Effort normal.  Musculoskeletal: Normal range of motion.  Neurological: She is oriented to person, place, and time.  Skin: Skin is warm and dry.  Psychiatric: She has a normal mood and affect. Her behavior is normal.  Vitals reviewed.   RECENT LABS AND TESTS: BMET    Component Value Date/Time     NA 139 11/11/2017 1127   NA 142 09/17/2017 1140   NA 141 04/16/2017 1409   K 3.9 11/11/2017 1127   K 3.8 04/16/2017 1409   CL 104 11/11/2017 1127   CO2 28 11/11/2017 1127   CO2 25 04/16/2017 1409   GLUCOSE 79 11/11/2017 1127   GLUCOSE 134 04/16/2017 1409   BUN 19 11/11/2017 1127  BUN 11 09/17/2017 1140   BUN 12.4 04/16/2017 1409   CREATININE 0.84 11/11/2017 1127   CREATININE 0.9 04/16/2017 1409   CALCIUM 10.2 11/11/2017 1127   CALCIUM 9.6 04/16/2017 1409   GFRNONAA >60 11/11/2017 1127   GFRAA >60 11/11/2017 1127   Lab Results  Component Value Date   HGBA1C 6.2 (H) 09/17/2017   Lab Results  Component Value Date   INSULIN 24.2 09/17/2017   CBC    Component Value Date/Time   WBC 6.8 11/11/2017 1127   RBC 5.02 11/11/2017 1127   HGB 14.2 11/11/2017 1127   HGB 13.7 04/16/2017 1409   HCT 42.2 11/11/2017 1127   HCT 41.2 04/16/2017 1409   PLT 248 11/11/2017 1127   PLT 259 04/16/2017 1409   MCV 84.1 11/11/2017 1127   MCV 84.4 04/16/2017 1409   MCH 28.2 11/11/2017 1127   MCHC 33.5 11/11/2017 1127   RDW 13.1 11/11/2017 1127   RDW 13.2 04/16/2017 1409   LYMPHSABS 1.7 11/11/2017 1127   LYMPHSABS 1.9 04/16/2017 1409   MONOABS 0.8 11/11/2017 1127   MONOABS 0.6 04/16/2017 1409   EOSABS 0.3 11/11/2017 1127   EOSABS 0.1 04/16/2017 1409   BASOSABS 0.0 11/11/2017 1127   BASOSABS 0.0 04/16/2017 1409   Iron/TIBC/Ferritin/ %Sat No results found for: IRON, TIBC, FERRITIN, IRONPCTSAT Lipid Panel  No results found for: CHOL, TRIG, HDL, CHOLHDL, VLDL, LDLCALC, LDLDIRECT Hepatic Function Panel     Component Value Date/Time   PROT 7.2 11/11/2017 1127   PROT 6.9 09/17/2017 1140   PROT 7.0 04/16/2017 1409   ALBUMIN 4.2 11/11/2017 1127   ALBUMIN 4.5 09/17/2017 1140   ALBUMIN 4.0 04/16/2017 1409   AST 17 11/11/2017 1127   AST 29 04/16/2017 1409   ALT 21 11/11/2017 1127   ALT 34 04/16/2017 1409   ALKPHOS 104 11/11/2017 1127   ALKPHOS 74 04/16/2017 1409   BILITOT 0.4  11/11/2017 1127   BILITOT 0.3 09/17/2017 1140   BILITOT 0.40 04/16/2017 1409      Component Value Date/Time   TSH 2.390 09/17/2017 1140    ASSESSMENT AND PLAN: Essential hypertension  Class 1 obesity with serious comorbidity and body mass index (BMI) of 34.0 to 34.9 in adult, unspecified obesity type  PLAN:  Hypertension We discussed sodium restriction, working on healthy weight loss, and a regular exercise program as the means to achieve improved blood pressure control. Timira agreed with this plan and agreed to follow up as directed. We will continue to monitor her blood pressure as well as her progress with the above lifestyle modifications. She will continue diet and exercise and will watch for signs of hypotension as she continues her lifestyle modifications. Candace [name] agrees to follow up with our clinic in 3 weeks.  We spent > than 50% of the 15 minute visit on the counseling as documented in the note.  Obesity Candace [name] is currently in the action stage of change. As such, her goal is to continue with weight loss efforts She has agreed to portion control better and make smarter food choices, such as increase vegetables and decrease simple carbohydrates  Candace [name] has been instructed to work up to a goal of 150 minutes of combined cardio and strengthening exercise per week for weight loss and overall health benefits. We discussed the following Behavioral Modification Strategies today: increasing lean protein intake and travel eating strategies    Candace [name] has agreed to follow up with our clinic in 3 weeks. She was informed of the importance of frequent  follow up visits to maximize her success with intensive lifestyle modifications for her multiple health conditions.   OBESITY BEHAVIORAL INTERVENTION VISIT  Today's visit was # 6 out of 22.  Starting weight: 249 lbs Starting date: 09/17/17 Today's weight : 221 lbs Today's date: 12/17/2017 Total lbs lost to date: 55 (Patients must lose  7 lbs in the first 6 months to continue with counseling)   ASK: We discussed the diagnosis of obesity with Candace [name] today and Candace [name] agreed to give [name] permission to discuss obesity behavioral modification therapy today.  ASSESS: Candace [name] has the diagnosis of obesity and her BMI today is 34.61 Candace [name] is in the action stage of change   ADVISE: Candace [name] was educated on the multiple health risks of obesity as well as the benefit of weight loss to improve her health. She was advised of the need for long term treatment and the importance of lifestyle modifications.  AGREE: Multiple dietary modification options and treatment options were discussed and  Candace [name] agreed to the above obesity treatment plan.   Candace [name], am acting as transcriptionist for Lacy Duverney, PA-C I, Lacy Duverney [facility], have reviewed this note and agree with its content

## 2018-01-02 MED FILL — rOPINIRole HCL 0.5 MG TABS: 0.5 | 90 days supply | Qty: 90 | Fill #3

## 2018-01-13 ENCOUNTER — Ambulatory Visit (INDEPENDENT_AMBULATORY_CARE_PROVIDER_SITE_OTHER): Payer: 59 | Admitting: Family Medicine

## 2018-01-13 VITALS — BP 116/80 | HR 97 | Temp 98.0°F | Ht 67.0 in | Wt 223.0 lb

## 2018-01-13 DIAGNOSIS — R7303 Prediabetes: Secondary | ICD-10-CM

## 2018-01-13 DIAGNOSIS — Z9189 Other specified personal risk factors, not elsewhere classified: Secondary | ICD-10-CM | POA: Diagnosis not present

## 2018-01-13 DIAGNOSIS — E559 Vitamin D deficiency, unspecified: Secondary | ICD-10-CM | POA: Diagnosis not present

## 2018-01-13 DIAGNOSIS — Z6834 Body mass index (BMI) 34.0-34.9, adult: Secondary | ICD-10-CM

## 2018-01-13 DIAGNOSIS — E669 Obesity, unspecified: Secondary | ICD-10-CM

## 2018-01-13 MED ORDER — VITAMIN D (ERGOCALCIFEROL) 1.25 MG (50000 UNIT) PO CAPS: 50000.0000 [IU] | ORAL_CAPSULE | ORAL | 0 refills | Status: DC

## 2018-01-13 MED ORDER — METFORMIN HCL 500 MG PO TABS: 500.0000 mg | ORAL_TABLET | Freq: Every day | ORAL | 0 refills | Status: DC

## 2018-01-13 MED FILL — metFORMIN HCL 500 MG TABS: 500 | 30 days supply | Qty: 30 | Fill #0

## 2018-01-13 MED FILL — VIT D2 1.25 MG (50,000 UNIT: 1.25 MG | 28 days supply | Qty: 4 | Fill #0

## 2018-01-13 NOTE — Progress Notes (Signed)
Office: [phone]  /  Fax: [phone]   HPI:   Chief Complaint: OBESITY Candace [name] is here to discuss her progress with her obesity treatment plan. She is on the Category 4 plan and is following her eating plan approximately 45 % of the time. She states she is doing cardio and strength exercises for 60 minutes 3 times per week. Candace [name] has had a few trips out of town and has had increasing stress with daughters.  Her weight is 223 lb (101.2 kg) today and has had a weight gain of 2 pounds over a period of 4 weeks since her last visit. She has lost 26 lbs since starting treatment with [name].  Pre-Diabetes Candace [name] has a diagnosis of prediabetes based on her elevated Hgb A1c and was informed this puts her at greater risk of developing diabetes. Her last labs were on 09/17/17. She is taking metformin currently and continues to work on diet and exercise to decrease risk of diabetes. She denies nausea or hypoglycemia.  At risk for diabetes Candace [name] is at higher than average risk for developing diabetes due to her obesity and prediabetes. She currently denies polyuria or polydipsia.  Vitamin D deficiency Candace [name] has a diagnosis of vitamin D deficiency. She is currently taking vit D. Candace [name] admits to fatigue and denies nausea, vomiting or muscle weakness.  ALLERGIES: No Known Allergies  MEDICATIONS: Current Outpatient Medications on File Prior to Visit  Medication Sig Dispense Refill  . anastrozole (ARIMIDEX) 1 MG tablet TAKE 1 TABLET (1 MG TOTAL) BY MOUTH DAILY. 90 tablet 4  . buPROPion (WELLBUTRIN XL) 300 MG 24 hr tablet Take 300 mg by mouth daily.     . cetirizine (ZYRTEC) 10 MG tablet Take 10 mg by mouth daily. Candace [name] [facility]Outdoor Allergy    . esomeprazole (NEXIUM) 40 MG capsule Take 40 mg by mouth every 36 (thirty-six) hours.    Candace [name] Kitchen FLUoxetine (PROZAC) 10 MG capsule Take 10 mg by mouth daily with lunch.     . metFORMIN (GLUCOPHAGE) 500 MG tablet Take 1 tablet (500 mg total) by mouth daily with  breakfast. 30 tablet 0  . Multiple Vitamin (MULTIVITAMIN WITH MINERALS) TABS tablet Take 1 tablet by mouth daily.    Candace [name] Kitchen rOPINIRole (REQUIP) 0.25 MG tablet Take 0.5 mg by mouth daily at 8 pm.    . rosuvastatin (CRESTOR) 10 MG tablet Take 10 mg by mouth at bedtime.    . Vitamin D, Ergocalciferol, (DRISDOL) 50000 units CAPS capsule Take 1 capsule (50,000 Units total) by mouth every 7 (seven) days. 4 capsule 0   No current facility-administered medications on file prior to visit.     PAST MEDICAL HISTORY: Past Medical History:  Diagnosis Date  . Abnormal Pap smear   . Alcohol abuse   . Anovulation   . Anxiety   . Breast cancer ([place]) 04/05/2016   right breast  . Cancer ([place]) 2017   right breast cancer  . Complication of anesthesia    slow to wake up  . Depression   . Endometrial hyperplasia   . Endometrial polyp    h/o  . Epidermal cyst    h/o  . Gallbladder problem   . GERD (gastroesophageal reflux disease)   . H/O hemorrhoids   . H/O menorrhagia   . High risk HPV infection   . Hyperlipidemia   . Insomnia   . Obesity   . Oligomenorrhea   . Prediabetes   . Restless legs syndrome   . Seasonal allergies   .  Vitamin D deficiency     PAST SURGICAL HISTORY: Past Surgical History:  Procedure Laterality Date  . BREAST LUMPECTOMY Right 05/2016   radiation  . BREAST LUMPECTOMY WITH RADIOACTIVE SEED AND SENTINEL LYMPH NODE BIOPSY Right 05/28/2016   Procedure: RADIOACTIVE SEED GUIDED RIGHT BREAST LUMPECTOMY WITH  RIGHT AXILLARY SENTINEL LYMPH NODE BIOPSY;  Surgeon: [name], MD;  Location: [facility];  Service: General;  Laterality: Right;  . CARPAL TUNNEL RELEASE  03/26/2011   right hand  . CARPAL TUNNEL RELEASE  05/28/2011   Procedure: CARPAL TUNNEL RELEASE;  Surgeon: [name];  Location: [facility];  Service: Orthopedics;  Laterality: Left;  . CHOLECYSTECTOMY N/A 01/24/2013   Procedure: LAPAROSCOPIC CHOLECYSTECTOMY WITH INTRAOPERATIVE CHOLANGIOGRAM;  Surgeon:  [name], MD;  Location: [facility];  Service: General;  Laterality: N/A;  . COLONOSCOPY    . HYSTEROSCOPY    . POLYPECTOMY    . TONSILLECTOMY  1967   as child  . trigger fingers  8 yrs. ago   both done months apart  . uterine ablation  06/04/2010  . uterine ablation      SOCIAL HISTORY: Social History   Tobacco Use  . Smoking status: Never Smoker  . Smokeless tobacco: Never Used  Substance Use Topics  . Alcohol use: No    Comment: none since 1996  . Drug use: No    FAMILY HISTORY: Family History  Problem Relation Age of Onset  . Heart disease Mother   . Hypertension Mother   . Stroke Mother   . Dementia Mother        vascular dementia  . Hyperlipidemia Mother   . Depression Mother   . Anxiety disorder Mother   . Alcohol abuse Mother   . Obesity Mother   . Pulmonary fibrosis Father        d. 36  . Depression Father   . Anxiety disorder Father   . Alcohol abuse Father   . Heart attack Maternal Grandmother        d. [phone]  . Heart disease Maternal Grandmother   . Heart disease Maternal Grandfather        d. 60-63  . Leukemia Paternal Grandmother        d. late 33s  . Breast cancer Sister 49       IDC s/p mastectomy and chemo    ROS: Review of Systems  Constitutional: Positive for malaise/fatigue. Negative for weight loss.  Gastrointestinal: Negative for nausea and vomiting.  Genitourinary: Negative for frequency.  Musculoskeletal:       Negative for muscle weakness  Endo/Heme/Allergies: Negative for polydipsia.       Negative for hypoglycemia    PHYSICAL EXAM: Blood pressure 116/80, pulse 97, temperature 98 F (36.7 C), temperature source Oral, height 5\' 7"  (1.702 m), weight 223 lb (101.2 kg), SpO2 96 %. Body mass index is 34.93 kg/m. Physical Exam  Constitutional: She is oriented to person, place, and time. She appears well-developed and well-nourished.  Cardiovascular: Normal rate.  Pulmonary/Chest: Effort normal.  Musculoskeletal: Normal  range of motion.  Neurological: She is oriented to person, place, and time.  Skin: Skin is warm and dry.  Psychiatric: She has a normal mood and affect. Her behavior is normal.    RECENT LABS AND TESTS: BMET    Component Value Date/Time   NA 139 11/11/2017 1127   NA 142 09/17/2017 1140   NA 141 04/16/2017 1409   K 3.9 11/11/2017 1127   K 3.8 04/16/2017  1409   CL 104 11/11/2017 1127   CO2 28 11/11/2017 1127   CO2 25 04/16/2017 1409   GLUCOSE 79 11/11/2017 1127   GLUCOSE 134 04/16/2017 1409   BUN 19 11/11/2017 1127   BUN 11 09/17/2017 1140   BUN 12.4 04/16/2017 1409   CREATININE 0.84 11/11/2017 1127   CREATININE 0.9 04/16/2017 1409   CALCIUM 10.2 11/11/2017 1127   CALCIUM 9.6 04/16/2017 1409   GFRNONAA >60 11/11/2017 1127   GFRAA >60 11/11/2017 1127   Lab Results  Component Value Date   HGBA1C 6.2 (H) 09/17/2017   Lab Results  Component Value Date   INSULIN 24.2 09/17/2017   CBC    Component Value Date/Time   WBC 6.8 11/11/2017 1127   RBC 5.02 11/11/2017 1127   HGB 14.2 11/11/2017 1127   HGB 13.7 04/16/2017 1409   HCT 42.2 11/11/2017 1127   HCT 41.2 04/16/2017 1409   PLT 248 11/11/2017 1127   PLT 259 04/16/2017 1409   MCV 84.1 11/11/2017 1127   MCV 84.4 04/16/2017 1409   MCH 28.2 11/11/2017 1127   MCHC 33.5 11/11/2017 1127   RDW 13.1 11/11/2017 1127   RDW 13.2 04/16/2017 1409   LYMPHSABS 1.7 11/11/2017 1127   LYMPHSABS 1.9 04/16/2017 1409   MONOABS 0.8 11/11/2017 1127   MONOABS 0.6 04/16/2017 1409   EOSABS 0.3 11/11/2017 1127   EOSABS 0.1 04/16/2017 1409   BASOSABS 0.0 11/11/2017 1127   BASOSABS 0.0 04/16/2017 1409   Iron/TIBC/Ferritin/ %Sat No results found for: IRON, TIBC, FERRITIN, IRONPCTSAT Lipid Panel  No results found for: CHOL, TRIG, HDL, CHOLHDL, VLDL, LDLCALC, LDLDIRECT Hepatic Function Panel     Component Value Date/Time   PROT 7.2 11/11/2017 1127   PROT 6.9 09/17/2017 1140   PROT 7.0 04/16/2017 1409   ALBUMIN 4.2 11/11/2017 1127    ALBUMIN 4.5 09/17/2017 1140   ALBUMIN 4.0 04/16/2017 1409   AST 17 11/11/2017 1127   AST 29 04/16/2017 1409   ALT 21 11/11/2017 1127   ALT 34 04/16/2017 1409   ALKPHOS 104 11/11/2017 1127   ALKPHOS 74 04/16/2017 1409   BILITOT 0.4 11/11/2017 1127   BILITOT 0.3 09/17/2017 1140   BILITOT 0.40 04/16/2017 1409      Component Value Date/Time   TSH 2.390 09/17/2017 1140    Vitamin D drawn 10/28/17 - 30.5  ASSESSMENT AND PLAN: Prediabetes - Plan: metFORMIN (GLUCOPHAGE) 500 MG tablet  Vitamin D deficiency - Plan: Vitamin D, Ergocalciferol, (DRISDOL) 50000 units CAPS capsule  At risk for diabetes mellitus  Class 1 obesity with serious comorbidity and body mass index (BMI) of 34.0 to 34.9 in adult, unspecified obesity type  PLAN:  Pre-Diabetes Candace [name] will continue to work on weight loss, exercise, and decreasing simple carbohydrates in her diet to help decrease the risk of diabetes. We dicussed metformin including benefits and risks. She was informed that eating too many simple carbohydrates or too many calories at one sitting increases the likelihood of GI side effects. Candace [name] requested metformin for now and a prescription was written today for Metformin 500mg  PO qAM #30 with no refills. Candace [name] agreed to follow up with [name] as directed to monitor her progress. We will repeat labs at the next office visit.  Diabetes risk counseling Candace [name] was given extended (15 minutes) diabetes prevention counseling today. She is 57 y.o. female and has risk factors for diabetes including obesity and pre-diabetes. We discussed intensive lifestyle modifications today with an emphasis on weight loss as well as increasing  exercise and decreasing simple carbohydrates in her diet.  Vitamin D Deficiency Candace [name] was informed that low vitamin D levels contribute to fatigue and are associated with obesity, breast, and colon cancer. She agrees to continue to take prescription Vit D @50 ,000 IU every week #4 with no  refills. We will check vitamin D level at the next visit and she will follow up for routine testing of vitamin D, at least 2-3 times per year. She was informed of the risk of over-replacement of vitamin D and agrees to not increase her dose unless she discusses this with [name] first. Candace [name] agrees to follow up as directed.  Obesity Candace [name] is currently in the action stage of change. As such, her goal is to continue with weight loss efforts and going to try to commit to making 3 dinners at home for the next week. She has agreed to follow the Category 4 plan. Candace [name] has been instructed to work up to a goal of 150 minutes of combined cardio and strengthening exercise per week for weight loss and overall health benefits. We discussed the following Behavioral Modification Strategies today: increasing lean protein intake, increasing vegetables, work on meal planning and easy cooking plans, and planning for success.  Candace [name] has agreed to follow up with our clinic in 2 weeks. She was informed of the importance of frequent follow up visits to maximize her success with intensive lifestyle modifications for her multiple health conditions.   OBESITY BEHAVIORAL INTERVENTION VISIT  Today's visit was # 7 out of 22.  Starting weight: 249 Starting date: 09/17/17 Today's weight : Weight: 223 lb (101.2 kg)  Today's date: 01/13/2018 Total lbs lost to date: 43    ASK: We discussed the diagnosis of obesity with Candace [name] today and Candace [name] agreed to give [name] permission to discuss obesity behavioral modification therapy today.  ASSESS: Candace [name] has the diagnosis of obesity and her BMI today is 34.92 Candace [name] is in the action stage of change   ADVISE: Candace [name] was educated on the multiple health risks of obesity as well as the benefit of weight loss to improve her health. She was advised of the need for long term treatment and the importance of lifestyle modifications.  AGREE: Multiple dietary modification options  and treatment options were discussed and Candace [name] agreed to the above obesity treatment plan.  I, Candace [name], am acting as Location manager for Eber Jones, MD  I have reviewed the above documentation for accuracy and completeness, and I agree with the above. - Candace Qua, MD

## 2018-01-23 MED FILL — FLUoxetine HCL 10 MG CAPS: 10 | 90 days supply | Qty: 90 | Fill #1

## 2018-01-28 ENCOUNTER — Ambulatory Visit (INDEPENDENT_AMBULATORY_CARE_PROVIDER_SITE_OTHER): Payer: 59 | Admitting: Physician Assistant

## 2018-01-28 VITALS — BP 136/82 | HR 83 | Temp 98.0°F | Ht 67.0 in | Wt 223.0 lb

## 2018-01-28 DIAGNOSIS — R7303 Prediabetes: Secondary | ICD-10-CM | POA: Diagnosis not present

## 2018-01-28 DIAGNOSIS — Z6835 Body mass index (BMI) 35.0-35.9, adult: Secondary | ICD-10-CM | POA: Diagnosis not present

## 2018-01-28 NOTE — Progress Notes (Signed)
Office: [phone]  /  Fax: [phone]   HPI:   Chief Complaint: OBESITY Candace [name] is here to discuss her progress with her obesity treatment plan. She is on the Category 4 plan and is following her eating plan approximately 60 % of the time. She states she is walking for 30 minutes 3-4 times per week. Candace [name] did a lot of traveling over the last few weeks and did a good job maintaining her weight. She reports that she skipped meals due to lack of planning. She is ready to get back on track.  Her weight is 223 lb (101.2 kg) today and has not lost weight since her last visit. She has lost 26 lbs since starting treatment with [name].  Pre-Diabetes Candace [name] has a diagnosis of pre-diabetes based on her elevated Hgb A1c and was informed this puts her at greater risk of developing diabetes. She is on metformin and denies polyphagia, nausea, vomiting, or diarrhea. She continues to work on diet and exercise to decrease risk of diabetes.   ALLERGIES: No Known Allergies  MEDICATIONS: Current Outpatient Medications on File Prior to Visit  Medication Sig Dispense Refill  . anastrozole (ARIMIDEX) 1 MG tablet TAKE 1 TABLET (1 MG TOTAL) BY MOUTH DAILY. 90 tablet 4  . buPROPion (WELLBUTRIN XL) 300 MG 24 hr tablet Take 300 mg by mouth daily.     . cetirizine (ZYRTEC) 10 MG tablet Take 10 mg by mouth daily. [facility]    . esomeprazole (NEXIUM) 40 MG capsule Take 40 mg by mouth every 36 (thirty-six) hours.    [name] FLUoxetine (PROZAC) 10 MG capsule Take 10 mg by mouth daily with lunch.     . metFORMIN (GLUCOPHAGE) 500 MG tablet Take 1 tablet (500 mg total) by mouth daily with breakfast. 30 tablet 0  . Multiple Vitamin (MULTIVITAMIN WITH MINERALS) TABS tablet Take 1 tablet by mouth daily.    [name] rOPINIRole (REQUIP) 0.25 MG tablet Take 0.5 mg by mouth daily at 8 pm.    . rosuvastatin (CRESTOR) 10 MG tablet Take 10 mg by mouth at bedtime.    . Vitamin D, Ergocalciferol, (DRISDOL) 50000 units CAPS  capsule Take 1 capsule (50,000 Units total) by mouth every 7 (seven) days. 4 capsule 0   No current facility-administered medications on file prior to visit.     PAST MEDICAL HISTORY: Past Medical History:  Diagnosis Date  . Abnormal Pap smear   . Alcohol abuse   . Anovulation   . Anxiety   . Breast cancer ([place]) 04/05/2016   right breast  . Cancer ([place]) 2017   right breast cancer  . Complication of anesthesia    slow to wake up  . Depression   . Endometrial hyperplasia   . Endometrial polyp    h/o  . Epidermal cyst    h/o  . Gallbladder problem   . GERD (gastroesophageal reflux disease)   . H/O hemorrhoids   . H/O menorrhagia   . High risk HPV infection   . Hyperlipidemia   . Insomnia   . Obesity   . Oligomenorrhea   . Prediabetes   . Restless legs syndrome   . Seasonal allergies   . Vitamin D deficiency     PAST SURGICAL HISTORY: Past Surgical History:  Procedure Laterality Date  . BREAST LUMPECTOMY Right 05/2016   radiation  . BREAST LUMPECTOMY WITH RADIOACTIVE SEED AND SENTINEL LYMPH NODE BIOPSY Right 05/28/2016   Procedure: RADIOACTIVE SEED GUIDED RIGHT BREAST LUMPECTOMY WITH  RIGHT  AXILLARY SENTINEL LYMPH NODE BIOPSY;  Surgeon: [name], MD;  Location: [facility];  Service: General;  Laterality: Right;  . CARPAL TUNNEL RELEASE  03/26/2011   right hand  . CARPAL TUNNEL RELEASE  05/28/2011   Procedure: CARPAL TUNNEL RELEASE;  Surgeon: [name];  [facility];  Service: Orthopedics;  Laterality: Left;  . CHOLECYSTECTOMY N/A 01/24/2013   Procedure: LAPAROSCOPIC CHOLECYSTECTOMY WITH INTRAOPERATIVE CHOLANGIOGRAM;  Surgeon: [name], MD;  Location: [facility];  Service: General;  Laterality: N/A;  . COLONOSCOPY    . HYSTEROSCOPY    . POLYPECTOMY    . TONSILLECTOMY  1967   as child  . trigger fingers  8 yrs. ago   both done months apart  . uterine ablation  06/04/2010  . uterine ablation      SOCIAL HISTORY: Social History    Tobacco Use  . Smoking status: Never Smoker  . Smokeless tobacco: Never Used  Substance Use Topics  . Alcohol use: No    Comment: none since 1996  . Drug use: No    FAMILY HISTORY: Family History  Problem Relation Age of Onset  . Heart disease Mother   . Hypertension Mother   . Stroke Mother   . Dementia Mother        vascular dementia  . Hyperlipidemia Mother   . Depression Mother   . Anxiety disorder Mother   . Alcohol abuse Mother   . Obesity Mother   . Pulmonary fibrosis Father        d. 34  . Depression Father   . Anxiety disorder Father   . Alcohol abuse Father   . Heart attack Maternal Grandmother        d. [phone]  . Heart disease Maternal Grandmother   . Heart disease Maternal Grandfather        d. 60-63  . Leukemia Paternal Grandmother        d. late 33s  . Breast cancer Sister 22       IDC s/p mastectomy and chemo    ROS: Review of Systems  Constitutional: Negative for weight loss.  Gastrointestinal: Negative for diarrhea, nausea and vomiting.  Endo/Heme/Allergies:       Negative polyphagia     PHYSICAL EXAM: Blood pressure 136/82, pulse 83, temperature 98 F (36.7 C), temperature source Oral, height 5\' 7"  (1.702 m), weight 223 lb (101.2 kg), SpO2 95 %. Body mass index is 34.93 kg/m. Physical Exam  Constitutional: She is oriented to person, place, and time. She appears well-developed and well-nourished.  Cardiovascular: Normal rate.  Pulmonary/Chest: Effort normal.  Musculoskeletal: Normal range of motion.  Neurological: She is oriented to person, place, and time.  Skin: Skin is warm and dry.  Psychiatric: She has a normal mood and affect. Her behavior is normal.  Vitals reviewed.   RECENT LABS AND TESTS: BMET    Component Value Date/Time   NA 139 11/11/2017 1127   NA 142 09/17/2017 1140   NA 141 04/16/2017 1409   K 3.9 11/11/2017 1127   K 3.8 04/16/2017 1409   CL 104 11/11/2017 1127   CO2 28 11/11/2017 1127   CO2 25 04/16/2017  1409   GLUCOSE 79 11/11/2017 1127   GLUCOSE 134 04/16/2017 1409   BUN 19 11/11/2017 1127   BUN 11 09/17/2017 1140   BUN 12.4 04/16/2017 1409   CREATININE 0.84 11/11/2017 1127   CREATININE 0.9 04/16/2017 1409   CALCIUM 10.2 11/11/2017 1127   CALCIUM 9.6 04/16/2017  [place] 11/11/2017 1127   GFRAA >60 11/11/2017 1127   Lab Results  Component Value Date   HGBA1C 6.2 (H) 09/17/2017   Lab Results  Component Value Date   INSULIN 24.2 09/17/2017   CBC    Component Value Date/Time   WBC 6.8 11/11/2017 1127   RBC 5.02 11/11/2017 1127   HGB 14.2 11/11/2017 1127   HGB 13.7 04/16/2017 1409   HCT 42.2 11/11/2017 1127   HCT 41.2 04/16/2017 1409   PLT 248 11/11/2017 1127   PLT 259 04/16/2017 1409   MCV 84.1 11/11/2017 1127   MCV 84.4 04/16/2017 1409   MCH 28.2 11/11/2017 1127   MCHC 33.5 11/11/2017 1127   RDW 13.1 11/11/2017 1127   RDW 13.2 04/16/2017 1409   LYMPHSABS 1.7 11/11/2017 1127   LYMPHSABS 1.9 04/16/2017 1409   MONOABS 0.8 11/11/2017 1127   MONOABS 0.6 04/16/2017 1409   EOSABS 0.3 11/11/2017 1127   EOSABS 0.1 04/16/2017 1409   BASOSABS 0.0 11/11/2017 1127   BASOSABS 0.0 04/16/2017 1409   Iron/TIBC/Ferritin/ %Sat No results found for: IRON, TIBC, FERRITIN, IRONPCTSAT Lipid Panel  No results found for: CHOL, TRIG, HDL, CHOLHDL, VLDL, LDLCALC, LDLDIRECT Hepatic Function Panel     Component Value Date/Time   PROT 7.2 11/11/2017 1127   PROT 6.9 09/17/2017 1140   PROT 7.0 04/16/2017 1409   ALBUMIN 4.2 11/11/2017 1127   ALBUMIN 4.5 09/17/2017 1140   ALBUMIN 4.0 04/16/2017 1409   AST 17 11/11/2017 1127   AST 29 04/16/2017 1409   ALT 21 11/11/2017 1127   ALT 34 04/16/2017 1409   ALKPHOS 104 11/11/2017 1127   ALKPHOS 74 04/16/2017 1409   BILITOT 0.4 11/11/2017 1127   BILITOT 0.3 09/17/2017 1140   BILITOT 0.40 04/16/2017 1409      Component Value Date/Time   TSH 2.390 09/17/2017 1140    ASSESSMENT AND PLAN: Prediabetes  Class 2 severe obesity  with serious comorbidity and body mass index (BMI) of 35.0 to 35.9 in adult, unspecified obesity type ([place])  PLAN:  Pre-Diabetes Candace [name] will continue to work on weight loss, diet, exercise, and decreasing simple carbohydrates in her diet to help decrease the risk of diabetes. We dicussed metformin including benefits and risks. She was informed that eating too many simple carbohydrates or too many calories at one sitting increases the likelihood of GI side effects. Candace [name] agrees to continue taking metformin and she agrees to follow up with our clinic in 2 to 3 weeks as directed to monitor her progress.  We spent > than 50% of the 15 minute visit on the counseling as documented in the note.  Obesity Candace [name] is currently in the action stage of change. As such, her goal is to continue with weight loss efforts She has agreed to follow the Category 4 plan Candace [name] has been instructed to work up to a goal of 150 minutes of combined cardio and strengthening exercise per week for weight loss and overall health benefits. We discussed the following Behavioral Modification Strategies today: increasing lean protein intake and no skipping meals   Candace [name] has agreed to follow up with our clinic in 2 to 3 weeks. She was informed of the importance of frequent follow up visits to maximize her success with intensive lifestyle modifications for her multiple health conditions.   OBESITY BEHAVIORAL INTERVENTION VISIT  Today's visit was # 8 out of 22.  Starting weight: 249 lbs Starting date: 09/17/17 Today's weight : 223 lbs  Today's  date: 01/28/2018 Total lbs lost to date: 55    ASK: We discussed the diagnosis of obesity with Candace [name] today and Candace [name] agreed to give [name] permission to discuss obesity behavioral modification therapy today.  ASSESS: Candace [name] has the diagnosis of obesity and her BMI today is 34.92 Candace [name] is in the action stage of change   ADVISE: Candace [name] was educated on the multiple health  risks of obesity as well as the benefit of weight loss to improve her health. She was advised of the need for long term treatment and the importance of lifestyle modifications.  AGREE: Multiple dietary modification options and treatment options were discussed and  Candace [name] agreed to the above obesity treatment plan.  Candace [name], am acting as transcriptionist for [name], PA-C I, [name], PA-C have reviewed above note and agree with its content

## 2018-02-11 ENCOUNTER — Other Ambulatory Visit (INDEPENDENT_AMBULATORY_CARE_PROVIDER_SITE_OTHER): Payer: Self-pay | Admitting: Family Medicine

## 2018-02-11 DIAGNOSIS — E559 Vitamin D deficiency, unspecified: Secondary | ICD-10-CM

## 2018-02-11 MED FILL — BUPROPION HCL XL 300 MG TAB: 300 | 90 days supply | Qty: 90 | Fill #1

## 2018-02-18 ENCOUNTER — Ambulatory Visit (INDEPENDENT_AMBULATORY_CARE_PROVIDER_SITE_OTHER): Payer: 59 | Admitting: Physician Assistant

## 2018-02-18 VITALS — BP 142/81 | HR 95 | Temp 97.6°F | Ht 67.0 in | Wt 225.0 lb

## 2018-02-18 DIAGNOSIS — Z9189 Other specified personal risk factors, not elsewhere classified: Secondary | ICD-10-CM | POA: Diagnosis not present

## 2018-02-18 DIAGNOSIS — Z6835 Body mass index (BMI) 35.0-35.9, adult: Secondary | ICD-10-CM

## 2018-02-18 DIAGNOSIS — R7303 Prediabetes: Secondary | ICD-10-CM

## 2018-02-18 DIAGNOSIS — E559 Vitamin D deficiency, unspecified: Secondary | ICD-10-CM

## 2018-02-18 DIAGNOSIS — E7849 Other hyperlipidemia: Secondary | ICD-10-CM | POA: Diagnosis not present

## 2018-02-18 DIAGNOSIS — E66812 Obesity, class 2: Secondary | ICD-10-CM

## 2018-02-18 MED ORDER — METFORMIN HCL 500 MG PO TABS: 500.0000 mg | ORAL_TABLET | Freq: Every day | ORAL | 0 refills | Status: DC

## 2018-02-18 MED ORDER — VITAMIN D (ERGOCALCIFEROL) 1.25 MG (50000 UNIT) PO CAPS: 50000.0000 [IU] | ORAL_CAPSULE | ORAL | 0 refills | Status: DC

## 2018-02-18 MED FILL — metFORMIN HCL 500 MG TABS: 500 | 30 days supply | Qty: 30 | Fill #0

## 2018-02-18 MED FILL — VIT D2 1.25 MG (50,000 UNIT: 1.25 MG | 28 days supply | Qty: 4 | Fill #0

## 2018-02-18 NOTE — Progress Notes (Signed)
Office: [phone]  /  Fax: [phone]   HPI:   Chief Complaint: OBESITY Candace [name] is here to discuss her progress with her obesity treatment plan. She is on the Category 4 plan and is following her eating plan approximately 60 % of the time. She states she is exercising 0 minutes 0 times per week. [name] struggled to follow the plan due to an increase in simple carbohydrate cravings. She also states that she has not been meal planning as much. She is ready to get back on track. Her weight is 225 lb (102.1 kg) today and has not lost weight since her last visit. She has lost 24 lbs since starting treatment with [name].  Vitamin D deficiency [name] has a diagnosis of vitamin D deficiency. [name] is on prescription vit D and she denies nausea, vomiting or muscle weakness.  Pre-Diabetes [name] has a diagnosis of prediabetes based on her elevated Hgb A1c and was informed this puts her at greater risk of developing diabetes. She is taking metformin currently and continues to work on diet and exercise to decrease risk of diabetes. She denies polyphagia or hypoglycemia.  At risk for diabetes [name] is at higher than average risk for developing diabetes due to her obesity and prediabetes. She currently denies polyuria or polydipsia.  Hyperlipidemia Georga has hyperlipidemia and is on rosuvastatin. She has been trying to improve her cholesterol levels with intensive lifestyle modification including a low saturated fat diet, exercise and weight loss. She denies any chest pain, claudication or myalgias.  ALLERGIES: No Known Allergies  MEDICATIONS: Current Outpatient Medications on File Prior to Visit  Medication Sig Dispense Refill  . anastrozole (ARIMIDEX) 1 MG tablet TAKE 1 TABLET (1 MG TOTAL) BY MOUTH DAILY. 90 tablet 4  . buPROPion (WELLBUTRIN XL) 300 MG 24 hr tablet Take 300 mg by mouth daily.     . cetirizine (ZYRTEC) 10 MG tablet Take 10 mg by mouth daily. [facility]      . esomeprazole (NEXIUM) 40 MG capsule Take 40 mg by mouth every 36 (thirty-six) hours.    [name] FLUoxetine (PROZAC) 10 MG capsule Take 10 mg by mouth daily with lunch.     . metFORMIN (GLUCOPHAGE) 500 MG tablet Take 1 tablet (500 mg total) by mouth daily with breakfast. 30 tablet 0  . Multiple Vitamin (MULTIVITAMIN WITH MINERALS) TABS tablet Take 1 tablet by mouth daily.    [name] rOPINIRole (REQUIP) 0.25 MG tablet Take 0.5 mg by mouth daily at 8 pm.    . rosuvastatin (CRESTOR) 10 MG tablet Take 10 mg by mouth at bedtime.    . Vitamin D, Ergocalciferol, (DRISDOL) 50000 units CAPS capsule Take 1 capsule (50,000 Units total) by mouth every 7 (seven) days. 4 capsule 0   No current facility-administered medications on file prior to visit.     PAST MEDICAL HISTORY: Past Medical History:  Diagnosis Date  . Abnormal Pap smear   . Alcohol abuse   . Anovulation   . Anxiety   . Breast cancer ([place]) 04/05/2016   right breast  . Cancer ([place]) 2017   right breast cancer  . Complication of anesthesia    slow to wake up  . Depression   . Endometrial hyperplasia   . Endometrial polyp    h/o  . Epidermal cyst    h/o  . Gallbladder problem   . GERD (gastroesophageal reflux disease)   . H/O hemorrhoids   . H/O menorrhagia   . High risk HPV infection   .  Hyperlipidemia   . Insomnia   . Obesity   . Oligomenorrhea   . Prediabetes   . Restless legs syndrome   . Seasonal allergies   . Vitamin D deficiency     PAST SURGICAL HISTORY: Past Surgical History:  Procedure Laterality Date  . BREAST LUMPECTOMY Right 05/2016   radiation  . BREAST LUMPECTOMY WITH RADIOACTIVE SEED AND SENTINEL LYMPH NODE BIOPSY Right 05/28/2016   Procedure: RADIOACTIVE SEED GUIDED RIGHT BREAST LUMPECTOMY WITH  RIGHT AXILLARY SENTINEL LYMPH NODE BIOPSY;  Surgeon: [name], MD;  Location: [facility];  Service: General;  Laterality: Right;  . CARPAL TUNNEL RELEASE  03/26/2011   right hand  . CARPAL TUNNEL RELEASE   05/28/2011   Procedure: CARPAL TUNNEL RELEASE;  Surgeon: [name];  Location: [facility];  Service: Orthopedics;  Laterality: Left;  . CHOLECYSTECTOMY N/A 01/24/2013   Procedure: LAPAROSCOPIC CHOLECYSTECTOMY WITH INTRAOPERATIVE CHOLANGIOGRAM;  Surgeon: [name], MD;  Location: [facility];  Service: General;  Laterality: N/A;  . COLONOSCOPY    . HYSTEROSCOPY    . POLYPECTOMY    . TONSILLECTOMY  1967   as child  . trigger fingers  8 yrs. ago   both done months apart  . uterine ablation  06/04/2010  . uterine ablation      SOCIAL HISTORY: Social History   Tobacco Use  . Smoking status: Never Smoker  . Smokeless tobacco: Never Used  Substance Use Topics  . Alcohol use: No    Comment: none since 1996  . Drug use: No    FAMILY HISTORY: Family History  Problem Relation Age of Onset  . Heart disease Mother   . Hypertension Mother   . Stroke Mother   . Dementia Mother        vascular dementia  . Hyperlipidemia Mother   . Depression Mother   . Anxiety disorder Mother   . Alcohol abuse Mother   . Obesity Mother   . Pulmonary fibrosis Father        d. 42  . Depression Father   . Anxiety disorder Father   . Alcohol abuse Father   . Heart attack Maternal Grandmother        d. [phone]  . Heart disease Maternal Grandmother   . Heart disease Maternal Grandfather        d. 60-63  . Leukemia Paternal Grandmother        d. late 68s  . Breast cancer Sister 55       IDC s/p mastectomy and chemo    ROS: Review of Systems  Constitutional: Negative for weight loss.  Cardiovascular: Negative for chest pain and claudication.  Gastrointestinal: Negative for nausea and vomiting.  Genitourinary: Negative for frequency.  Musculoskeletal: Negative for myalgias.       Negative for muscle weakness  Endo/Heme/Allergies: Negative for polydipsia.       Negative for polyphagia Negative for hypoglycemia    PHYSICAL EXAM: Blood pressure (!) 142/81, pulse 95, temperature 97.6  F (36.4 C), temperature source Oral, height 5\' 7"  (1.702 m), weight 225 lb (102.1 kg), SpO2 98 %. Body mass index is 35.24 kg/m. Physical Exam  Constitutional: She is oriented to person, place, and time. She appears well-developed and well-nourished.  Cardiovascular: Normal rate.  Pulmonary/Chest: Effort normal.  Musculoskeletal: Normal range of motion.  Neurological: She is oriented to person, place, and time.  Skin: Skin is warm and dry.  Psychiatric: She has a normal mood and affect. Her behavior is normal.  Vitals reviewed.   RECENT LABS AND TESTS: BMET    Component Value Date/Time   NA 139 11/11/2017 1127   NA 142 09/17/2017 1140   NA 141 04/16/2017 1409   K 3.9 11/11/2017 1127   K 3.8 04/16/2017 1409   CL 104 11/11/2017 1127   CO2 28 11/11/2017 1127   CO2 25 04/16/2017 1409   GLUCOSE 79 11/11/2017 1127   GLUCOSE 134 04/16/2017 1409   BUN 19 11/11/2017 1127   BUN 11 09/17/2017 1140   BUN 12.4 04/16/2017 1409   CREATININE 0.84 11/11/2017 1127   CREATININE 0.9 04/16/2017 1409   CALCIUM 10.2 11/11/2017 1127   CALCIUM 9.6 04/16/2017 1409   GFRNONAA >60 11/11/2017 1127   GFRAA >60 11/11/2017 1127   Lab Results  Component Value Date   HGBA1C 6.2 (H) 09/17/2017   Lab Results  Component Value Date   INSULIN 24.2 09/17/2017   CBC    Component Value Date/Time   WBC 6.8 11/11/2017 1127   RBC 5.02 11/11/2017 1127   HGB 14.2 11/11/2017 1127   HGB 13.7 04/16/2017 1409   HCT 42.2 11/11/2017 1127   HCT 41.2 04/16/2017 1409   PLT 248 11/11/2017 1127   PLT 259 04/16/2017 1409   MCV 84.1 11/11/2017 1127   MCV 84.4 04/16/2017 1409   MCH 28.2 11/11/2017 1127   MCHC 33.5 11/11/2017 1127   RDW 13.1 11/11/2017 1127   RDW 13.2 04/16/2017 1409   LYMPHSABS 1.7 11/11/2017 1127   LYMPHSABS 1.9 04/16/2017 1409   MONOABS 0.8 11/11/2017 1127   MONOABS 0.6 04/16/2017 1409   EOSABS 0.3 11/11/2017 1127   EOSABS 0.1 04/16/2017 1409   BASOSABS 0.0 11/11/2017 1127   BASOSABS  0.0 04/16/2017 1409   Iron/TIBC/Ferritin/ %Sat No results found for: IRON, TIBC, FERRITIN, IRONPCTSAT Lipid Panel  No results found for: CHOL, TRIG, HDL, CHOLHDL, VLDL, LDLCALC, LDLDIRECT Hepatic Function Panel     Component Value Date/Time   PROT 7.2 11/11/2017 1127   PROT 6.9 09/17/2017 1140   PROT 7.0 04/16/2017 1409   ALBUMIN 4.2 11/11/2017 1127   ALBUMIN 4.5 09/17/2017 1140   ALBUMIN 4.0 04/16/2017 1409   AST 17 11/11/2017 1127   AST 29 04/16/2017 1409   ALT 21 11/11/2017 1127   ALT 34 04/16/2017 1409   ALKPHOS 104 11/11/2017 1127   ALKPHOS 74 04/16/2017 1409   BILITOT 0.4 11/11/2017 1127   BILITOT 0.3 09/17/2017 1140   BILITOT 0.40 04/16/2017 1409      Component Value Date/Time   TSH 2.390 09/17/2017 1140   Vitamin D There are no recent lab results  ASSESSMENT AND PLAN: Vitamin D deficiency - Plan: VITAMIN D 25 Hydroxy (Vit-D Deficiency, Fractures), Vitamin D, Ergocalciferol, (DRISDOL) 50000 units CAPS capsule  Prediabetes - Plan: Comprehensive metabolic panel, Hemoglobin A1c, Insulin, random, metFORMIN (GLUCOPHAGE) 500 MG tablet  Other hyperlipidemia - Plan: Lipid Panel With LDL/HDL Ratio  At risk for diabetes mellitus  Class 2 severe obesity with serious comorbidity and body mass index (BMI) of 35.0 to 35.9 in adult, unspecified obesity type (HCC)  PLAN:  Vitamin D Deficiency Trystyn was informed that low vitamin D levels contributes to fatigue and are associated with obesity, breast, and colon cancer. She agrees to continue to take prescription Vit D @50 ,000 IU every week #4 with no refills. We will check labs and  will follow up for routine testing of vitamin D, at least 2-3 times per year. She was informed of the risk of over-replacement of  vitamin D and agrees to not increase her dose unless she discusses this with [name] first. Uchechi agrees to follow up as directed.  Pre-Diabetes Jackalyn will continue to work on weight loss, exercise, and decreasing simple  carbohydrates in her diet to help decrease the risk of diabetes. We dicussed metformin including benefits and risks. She was informed that eating too many simple carbohydrates or too many calories at one sitting increases the likelihood of GI side effects. Beryle requested metformin for now and a prescription was written today for 1 month refill. We will check labs and Karleigh agreed to follow up with [name] as directed to monitor her progress.  Diabetes risk counseling [name] was given extended (15 minutes) diabetes prevention counseling today. She is 57 y.o. female and has risk factors for diabetes including obesity and prediabetes. We discussed intensive lifestyle modifications today with an emphasis on weight loss as well as increasing exercise and decreasing simple carbohydrates in her diet.  Obesity Cynda is currently in the action stage of change. As such, her goal is to continue with weight loss efforts She has agreed to follow the Category 4 plan Chelise has been instructed to work up to a goal of 150 minutes of combined cardio and strengthening exercise per week for weight loss and overall health benefits. We discussed the following Behavioral Modification Strategies today: better snacking choices and work on meal planning and easy cooking plans  Tracie has agreed to follow up with our clinic in 2 weeks. She was informed of the importance of frequent follow up visits to maximize her success with intensive lifestyle modifications for her multiple health conditions.   OBESITY BEHAVIORAL INTERVENTION VISIT  Today's visit was # 9   Starting weight: 249 lbs Starting date: 09/17/17 Today's weight : 225 lbs  Today's date: 02/18/2018 Total lbs lost to date: 24 At least 15 minutes were spent on discussing the following behavioral intervention visit.   ASK: We discussed the diagnosis of obesity with Leland Johns today and Onie agreed to give [name] permission to discuss obesity behavioral  modification therapy today.  ASSESS: Fleda has the diagnosis of obesity and her BMI today is 35.23 Lashannon is in the action stage of change   ADVISE: Keslie was educated on the multiple health risks of obesity as well as the benefit of weight loss to improve her health. She was advised of the need for long term treatment and the importance of lifestyle modifications to improve her current health and to decrease her risk of future health problems.  AGREE: Multiple dietary modification options and treatment options were discussed and  Zahli agreed to follow the recommendations documented in the above note.  ARRANGE: Jolin was educated on the importance of frequent visits to treat obesity as outlined per CMS and USPSTF guidelines and agreed to schedule her next follow up appointment today.  Corey Skains, am acting as transcriptionist for [name], PA-C I, [name], PA-C have reviewed above note and agree with its content

## 2018-02-19 LAB — COMPREHENSIVE METABOLIC PANEL
ALBUMIN: 4.5 g/dL (ref 3.5–5.5)
ALK PHOS: 94 IU/L (ref 39–117)
ALT: 20 IU/L (ref 0–32)
AST: 15 IU/L (ref 0–40)
Albumin/Globulin Ratio: 1.7 (ref 1.2–2.2)
BUN / CREAT RATIO: 19 (ref 9–23)
BUN: 15 mg/dL (ref 6–24)
Bilirubin Total: 0.3 mg/dL (ref 0.0–1.2)
CO2: 26 mmol/L (ref 20–29)
CREATININE: 0.77 mg/dL (ref 0.57–1.00)
Calcium: 9.9 mg/dL (ref 8.7–10.2)
Chloride: 100 mmol/L (ref 96–106)
GFR calc Af Amer: 100 mL/min/{1.73_m2} (ref >59)
GFR calc non Af Amer: 87 mL/min/{1.73_m2} (ref >59)
GLUCOSE: 105 mg/dL — AB (ref 65–99)
Globulin, Total: 2.7 g/dL (ref 1.5–4.5)
Potassium: 4.1 mmol/L (ref 3.5–5.2)
Sodium: 139 mmol/L (ref 134–144)
Total Protein: 7.2 g/dL (ref 6.0–8.5)

## 2018-02-19 LAB — LIPID PANEL WITH LDL/HDL RATIO
CHOLESTEROL TOTAL: 181 mg/dL (ref 100–199)
HDL: 49 mg/dL (ref >39)
LDL CALC: 88 mg/dL (ref 0–99)
LDl/HDL Ratio: 1.8 ratio (ref 0.0–3.2)
TRIGLYCERIDES: 222 mg/dL — AB (ref 0–149)
VLDL Cholesterol Cal: 44 mg/dL — ABNORMAL HIGH (ref 5–40)

## 2018-02-19 LAB — VITAMIN D 25 HYDROXY (VIT D DEFICIENCY, FRACTURES): Vit D, 25-Hydroxy: 43.2 ng/mL (ref 30.0–100.0)

## 2018-02-19 LAB — HEMOGLOBIN A1C
ESTIMATED AVERAGE GLUCOSE: 123 mg/dL
HEMOGLOBIN A1C: 5.9 % — AB (ref 4.8–5.6)

## 2018-02-19 LAB — INSULIN, RANDOM: INSULIN: 15.2 u[IU]/mL (ref 2.6–24.9)

## 2018-02-24 MED FILL — ESOMEPRAZOLE MAG DR 40 MG C: 40 | 90 days supply | Qty: 90 | Fill #1

## 2018-02-24 MED FILL — ANASTROZOLE 1 MG TABLET: 1 | 90 days supply | Qty: 90 | Fill #1

## 2018-03-05 ENCOUNTER — Ambulatory Visit (INDEPENDENT_AMBULATORY_CARE_PROVIDER_SITE_OTHER): Payer: 59 | Admitting: Physician Assistant

## 2018-03-05 ENCOUNTER — Encounter (INDEPENDENT_AMBULATORY_CARE_PROVIDER_SITE_OTHER): Payer: Self-pay | Admitting: Physician Assistant

## 2018-03-05 VITALS — BP 117/78 | HR 83 | Temp 98.1°F | Ht 67.0 in | Wt 229.0 lb

## 2018-03-05 DIAGNOSIS — Z9189 Other specified personal risk factors, not elsewhere classified: Secondary | ICD-10-CM

## 2018-03-05 DIAGNOSIS — Z6836 Body mass index (BMI) 36.0-36.9, adult: Secondary | ICD-10-CM | POA: Diagnosis not present

## 2018-03-05 DIAGNOSIS — E559 Vitamin D deficiency, unspecified: Secondary | ICD-10-CM

## 2018-03-05 DIAGNOSIS — R7303 Prediabetes: Secondary | ICD-10-CM | POA: Diagnosis not present

## 2018-03-05 MED ORDER — VITAMIN D (ERGOCALCIFEROL) 1.25 MG (50000 UNIT) PO CAPS: 50000.0000 [IU] | ORAL_CAPSULE | ORAL | 0 refills | Status: DC

## 2018-03-05 NOTE — Progress Notes (Signed)
Office: [phone]  /  Fax: [phone]   HPI:   Chief Complaint: OBESITY Candace [name] is here to discuss her progress with her obesity treatment plan. She is on the Category 4 plan and is following her eating plan approximately 33 % of the time. She states she is walking for 15 minutes 4 times per week. Candace [name] reports that she struggled to follow Category 4 plan. She states that she is ready for a change.  Her weight is 229 lb (103.9 kg) today and has gained 4 pounds since her last visit. She has lost 20 lbs since starting treatment with [name].  Vitamin D Deficiency Candace [name] has a diagnosis of vitamin D deficiency. She is on prescription Vit D and last level at goal. She denies nausea, vomiting or muscle weakness.  Pre-Diabetes Candace [name] has a diagnosis of pre-diabetes based on her elevated Hgb A1c and was informed this puts her at greater risk of developing diabetes. She is on metformin and denies nausea, vomiting, diarrhea, or hypoglycemia. Last A1c was 5.9 (improved). She continues to work on diet and exercise to decrease risk of diabetes.  At risk for diabetes Candace [name] is at higher than average risk for developing diabetes due to her obesity and pre-diabetes. She currently denies polyuria or polydipsia.  ALLERGIES: No Known Allergies  MEDICATIONS: Current Outpatient Medications on File Prior to Visit  Medication Sig Dispense Refill  . anastrozole (ARIMIDEX) 1 MG tablet TAKE 1 TABLET (1 MG TOTAL) BY MOUTH DAILY. 90 tablet 4  . buPROPion (WELLBUTRIN XL) 300 MG 24 hr tablet Take 300 mg by mouth daily.     . cetirizine (ZYRTEC) 10 MG tablet Take 10 mg by mouth daily. [facility]    . esomeprazole (NEXIUM) 40 MG capsule Take 40 mg by mouth every 36 (thirty-six) hours.    [name] FLUoxetine (PROZAC) 10 MG capsule Take 10 mg by mouth daily with lunch.     . metFORMIN (GLUCOPHAGE) 500 MG tablet Take 1 tablet (500 mg total) by mouth daily with breakfast. 30 tablet 0  . Multiple  Vitamin (MULTIVITAMIN WITH MINERALS) TABS tablet Take 1 tablet by mouth daily.    [name] rOPINIRole (REQUIP) 0.25 MG tablet Take 0.5 mg by mouth daily at 8 pm.    . rosuvastatin (CRESTOR) 10 MG tablet Take 10 mg by mouth at bedtime.     No current facility-administered medications on file prior to visit.     PAST MEDICAL HISTORY: Past Medical History:  Diagnosis Date  . Abnormal Pap smear   . Alcohol abuse   . Anovulation   . Anxiety   . Breast cancer ([place]) 04/05/2016   right breast  . Cancer ([place]) 2017   right breast cancer  . Complication of anesthesia    slow to wake up  . Depression   . Endometrial hyperplasia   . Endometrial polyp    h/o  . Epidermal cyst    h/o  . Gallbladder problem   . GERD (gastroesophageal reflux disease)   . H/O hemorrhoids   . H/O menorrhagia   . High risk HPV infection   . Hyperlipidemia   . Insomnia   . Obesity   . Oligomenorrhea   . Prediabetes   . Restless legs syndrome   . Seasonal allergies   . Vitamin D deficiency     PAST SURGICAL HISTORY: Past Surgical History:  Procedure Laterality Date  . BREAST LUMPECTOMY Right 05/2016   radiation  . BREAST LUMPECTOMY WITH RADIOACTIVE SEED AND SENTINEL  LYMPH NODE BIOPSY Right 05/28/2016   Procedure: RADIOACTIVE SEED GUIDED RIGHT BREAST LUMPECTOMY WITH  RIGHT AXILLARY SENTINEL LYMPH NODE BIOPSY;  Surgeon: [name], MD;  Location: [facility];  Service: General;  Laterality: Right;  . CARPAL TUNNEL RELEASE  03/26/2011   right hand  . CARPAL TUNNEL RELEASE  05/28/2011   Procedure: CARPAL TUNNEL RELEASE;  Surgeon: [name];  Location: [facility];  Service: Orthopedics;  Laterality: Left;  . CHOLECYSTECTOMY N/A 01/24/2013   Procedure: LAPAROSCOPIC CHOLECYSTECTOMY WITH INTRAOPERATIVE CHOLANGIOGRAM;  Surgeon: [name], MD;  Location: [facility];  Service: General;  Laterality: N/A;  . COLONOSCOPY    . HYSTEROSCOPY    . POLYPECTOMY    . TONSILLECTOMY  1967   as child  . trigger  fingers  8 yrs. ago   both done months apart  . uterine ablation  06/04/2010  . uterine ablation      SOCIAL HISTORY: Social History   Tobacco Use  . Smoking status: Never Smoker  . Smokeless tobacco: Never Used  Substance Use Topics  . Alcohol use: No    Comment: none since 1996  . Drug use: No    FAMILY HISTORY: Family History  Problem Relation Age of Onset  . Heart disease Mother   . Hypertension Mother   . Stroke Mother   . Dementia Mother        vascular dementia  . Hyperlipidemia Mother   . Depression Mother   . Anxiety disorder Mother   . Alcohol abuse Mother   . Obesity Mother   . Pulmonary fibrosis Father        d. 82  . Depression Father   . Anxiety disorder Father   . Alcohol abuse Father   . Heart attack Maternal Grandmother        d. [phone]  . Heart disease Maternal Grandmother   . Heart disease Maternal Grandfather        d. 60-63  . Leukemia Paternal Grandmother        d. late 71s  . Breast cancer Sister 54       IDC s/p mastectomy and chemo    ROS: Review of Systems  Constitutional: Negative for weight loss.  Gastrointestinal: Negative for diarrhea, nausea and vomiting.  Genitourinary: Negative for frequency.  Musculoskeletal:       Negative muscle weakness  Endo/Heme/Allergies: Negative for polydipsia.       Negative hypoglycemia    PHYSICAL EXAM: Blood pressure 117/78, pulse 83, temperature 98.1 F (36.7 C), temperature source Oral, height 5\' 7"  (1.702 m), weight 229 lb (103.9 kg), SpO2 97 %. Body mass index is 35.87 kg/m. Physical Exam  Constitutional: She is oriented to person, place, and time. She appears well-developed and well-nourished.  Cardiovascular: Normal rate.  Pulmonary/Chest: Effort normal.  Musculoskeletal: Normal range of motion.  Neurological: She is oriented to person, place, and time.  Skin: Skin is warm and dry.  Psychiatric: She has a normal mood and affect. Her behavior is normal.  Vitals  reviewed.   RECENT LABS AND TESTS: BMET    Component Value Date/Time   NA 139 02/18/2018 1011   NA 141 04/16/2017 1409   K 4.1 02/18/2018 1011   K 3.8 04/16/2017 1409   CL 100 02/18/2018 1011   CO2 26 02/18/2018 1011   CO2 25 04/16/2017 1409   GLUCOSE 105 (H) 02/18/2018 1011   GLUCOSE 79 11/11/2017 1127   GLUCOSE 134 04/16/2017 1409   BUN 15 02/18/2018  1011   BUN 12.4 04/16/2017 1409   CREATININE 0.77 02/18/2018 1011   CREATININE 0.9 04/16/2017 1409   CALCIUM 9.9 02/18/2018 1011   CALCIUM 9.6 04/16/2017 1409   GFRNONAA 87 02/18/2018 1011   GFRAA 100 02/18/2018 1011   Lab Results  Component Value Date   HGBA1C 5.9 (H) 02/18/2018   HGBA1C 6.2 (H) 09/17/2017   Lab Results  Component Value Date   INSULIN 15.2 02/18/2018   INSULIN 24.2 09/17/2017   CBC    Component Value Date/Time   WBC 6.8 11/11/2017 1127   RBC 5.02 11/11/2017 1127   HGB 14.2 11/11/2017 1127   HGB 13.7 04/16/2017 1409   HCT 42.2 11/11/2017 1127   HCT 41.2 04/16/2017 1409   PLT 248 11/11/2017 1127   PLT 259 04/16/2017 1409   MCV 84.1 11/11/2017 1127   MCV 84.4 04/16/2017 1409   MCH 28.2 11/11/2017 1127   MCHC 33.5 11/11/2017 1127   RDW 13.1 11/11/2017 1127   RDW 13.2 04/16/2017 1409   LYMPHSABS 1.7 11/11/2017 1127   LYMPHSABS 1.9 04/16/2017 1409   MONOABS 0.8 11/11/2017 1127   MONOABS 0.6 04/16/2017 1409   EOSABS 0.3 11/11/2017 1127   EOSABS 0.1 04/16/2017 1409   BASOSABS 0.0 11/11/2017 1127   BASOSABS 0.0 04/16/2017 1409   Iron/TIBC/Ferritin/ %Sat No results found for: IRON, TIBC, FERRITIN, IRONPCTSAT Lipid Panel     Component Value Date/Time   CHOL 181 02/18/2018 1011   TRIG 222 (H) 02/18/2018 1011   HDL 49 02/18/2018 1011   LDLCALC 88 02/18/2018 1011   Hepatic Function Panel     Component Value Date/Time   PROT 7.2 02/18/2018 1011   PROT 7.0 04/16/2017 1409   ALBUMIN 4.5 02/18/2018 1011   ALBUMIN 4.0 04/16/2017 1409   AST 15 02/18/2018 1011   AST 29 04/16/2017 1409   ALT  20 02/18/2018 1011   ALT 34 04/16/2017 1409   ALKPHOS 94 02/18/2018 1011   ALKPHOS 74 04/16/2017 1409   BILITOT 0.3 02/18/2018 1011   BILITOT 0.40 04/16/2017 1409      Component Value Date/Time   TSH 2.390 09/17/2017 1140  Results for TREINA, ARSCOTT (MRN [id number]) as of 03/05/2018 15:56  Ref. Range 02/18/2018 10:11  Vitamin D, 25-Hydroxy Latest Ref Range: 30.0 - 100.0 ng/mL 43.2    ASSESSMENT AND PLAN: Vitamin D deficiency - Plan: Vitamin D, Ergocalciferol, (DRISDOL) 50000 units CAPS capsule  Prediabetes  At risk for diabetes mellitus  Class 2 severe obesity with serious comorbidity and body mass index (BMI) of 36.0 to 36.9 in adult, unspecified obesity type ([place])  PLAN:  Vitamin D Deficiency Candace [name] was informed that low vitamin D levels contributes to fatigue and are associated with obesity, breast, and colon cancer. Setareh agrees to continue taking prescription Vit D @50 ,000 IU every week #4 and we will refill for 1 month. She will follow up for routine testing of vitamin D, at least 2-3 times per year. She was informed of the risk of over-replacement of vitamin D and agrees to not increase her dose unless she discusses this with [name] first. Lacretia agrees to follow up with our clinic in 2 to 3 weeks.  Pre-Diabetes Candace [name] will continue to work on weight loss, diet, exercise, and decreasing simple carbohydrates in her diet to help decrease the risk of diabetes. We dicussed metformin including benefits and risks. She was informed that eating too many simple carbohydrates or too many calories at one sitting increases the likelihood of GI  side effects. Candace [name] agrees to continue taking metformin and she agrees to follow up with our clinic in 2 to 3 weeks as directed to monitor her progress.  Diabetes risk counselling Candace [name] was given extended (15 minutes) diabetes prevention counseling today. She is 57 y.o. female and has risk factors for diabetes including obesity and pre-diabetes. We  discussed intensive lifestyle modifications today with an emphasis on weight loss as well as increasing exercise and decreasing simple carbohydrates in her diet.  Obesity Candace [name] is currently in the action stage of change. As such, her goal is to continue with weight loss efforts She has agreed to keep a food journal with 1700-1800 calories and 115 grams of protein daily Candace [name] has been instructed to work up to a goal of 150 minutes of combined cardio and strengthening exercise per week for weight loss and overall health benefits. We discussed the following Behavioral Modification Strategies today: work on meal planning and easy cooking plans and keeping healthy foods in the home Recipes and eating out information was given.  Candace [name] has agreed to follow up with our clinic in 2 to 3 weeks. She was informed of the importance of frequent follow up visits to maximize her success with intensive lifestyle modifications for her multiple health conditions.   OBESITY BEHAVIORAL INTERVENTION VISIT  Today's visit was # 10 out of 22.  Starting weight: 249 lbs Starting date: 09/17/17 Today's weight : 229 lbs  Today's date: 03/05/2018 Total lbs lost to date: 24    ASK: We discussed the diagnosis of obesity with Leland Johns today and Greenly agreed to give [name] permission to discuss obesity behavioral modification therapy today.  ASSESS: Candace [name] has the diagnosis of obesity and her BMI today is 35.86 Candace [name] is in the action stage of change   ADVISE: Candace [name] was educated on the multiple health risks of obesity as well as the benefit of weight loss to improve her health. She was advised of the need for long term treatment and the importance of lifestyle modifications.  AGREE: Multiple dietary modification options and treatment options were discussed and  Candace [name] agreed to the above obesity treatment plan.  Wilhemena Durie, am acting as transcriptionist for [name], PA-C I, [name], PA-C  have reviewed above note and agree with its content

## 2018-03-06 MED FILL — ROSUVASTATIN CALCIUM 10 MG: 10 | 90 days supply | Qty: 90 | Fill #1

## 2018-03-24 ENCOUNTER — Ambulatory Visit (INDEPENDENT_AMBULATORY_CARE_PROVIDER_SITE_OTHER): Payer: 59 | Admitting: Physician Assistant

## 2018-03-24 VITALS — BP 143/81 | HR 82 | Temp 97.7°F | Ht 67.0 in | Wt 228.0 lb

## 2018-03-24 DIAGNOSIS — Z6835 Body mass index (BMI) 35.0-35.9, adult: Secondary | ICD-10-CM | POA: Diagnosis not present

## 2018-03-24 DIAGNOSIS — Z9189 Other specified personal risk factors, not elsewhere classified: Secondary | ICD-10-CM | POA: Diagnosis not present

## 2018-03-24 DIAGNOSIS — E66812 Obesity, class 2: Secondary | ICD-10-CM

## 2018-03-24 DIAGNOSIS — E559 Vitamin D deficiency, unspecified: Secondary | ICD-10-CM

## 2018-03-24 DIAGNOSIS — R7303 Prediabetes: Secondary | ICD-10-CM | POA: Diagnosis not present

## 2018-03-24 MED ORDER — METFORMIN HCL 500 MG PO TABS: 500.0000 mg | ORAL_TABLET | Freq: Every day | ORAL | 0 refills | Status: DC

## 2018-03-24 MED ORDER — VITAMIN D (ERGOCALCIFEROL) 1.25 MG (50000 UNIT) PO CAPS: 50000.0000 [IU] | ORAL_CAPSULE | ORAL | 0 refills | Status: DC

## 2018-03-24 MED FILL — VIT D2 1.25 MG (50,000 UNIT: 1.25 MG | 28 days supply | Qty: 4 | Fill #0

## 2018-03-24 MED FILL — metFORMIN HCL 500 MG TABS: 500 | 30 days supply | Qty: 30 | Fill #0

## 2018-03-24 NOTE — Progress Notes (Signed)
Office: [phone]  /  Fax: [phone]   HPI:   Chief Complaint: OBESITY Candace [name] is here to discuss her progress with her obesity treatment plan. She is on the  keep a food journal with 1700-1800  calories and 100 g of  protein  and is following her eating plan approximately 50 % of the time. She states she is walking 1 mile per day 5 days per week.  Candace [name] was on vacation the past week and did well with weight loss. She reports struggling with meal planning.  Her weight is 228 lb (103.4 kg) today and has had a weight loss of 1 pounds over a period of 2 weeks since her last visit. She has lost 21 lbs since starting treatment with [name].  Vitamin D deficiency [name] has a diagnosis of vitamin D deficiency. She is currently taking vit D and denies nausea, vomiting or muscle weakness.  Pre-Diabetes Candace [name] has a diagnosis of prediabetes based on her elevated HgA1c and was informed this puts her at greater risk of developing diabetes. She is taking metformin currently and continues to work on diet and exercise to decrease risk of diabetes. She denies nausea, polyphagia or hypoglycemia.  At risk for osteopenia and osteoporosis Candace [name] is at higher risk of osteopenia and osteoporosis due to vitamin D deficiency.    ALLERGIES: No Known Allergies  MEDICATIONS: Current Outpatient Medications on File Prior to Visit  Medication Sig Dispense Refill  . anastrozole (ARIMIDEX) 1 MG tablet TAKE 1 TABLET (1 MG TOTAL) BY MOUTH DAILY. 90 tablet 4  . buPROPion (WELLBUTRIN XL) 300 MG 24 hr tablet Take 300 mg by mouth daily.     . cetirizine (ZYRTEC) 10 MG tablet Take 10 mg by mouth daily. [facility]    . esomeprazole (NEXIUM) 40 MG capsule Take 40 mg by mouth every 36 (thirty-six) hours.    [name] FLUoxetine (PROZAC) 10 MG capsule Take 10 mg by mouth daily with lunch.     . Multiple Vitamin (MULTIVITAMIN WITH MINERALS) TABS tablet Take 1 tablet by mouth daily.    [name] rOPINIRole (REQUIP)  0.25 MG tablet Take 0.5 mg by mouth daily at 8 pm.    . rosuvastatin (CRESTOR) 10 MG tablet Take 10 mg by mouth at bedtime.     No current facility-administered medications on file prior to visit.     PAST MEDICAL HISTORY: Past Medical History:  Diagnosis Date  . Abnormal Pap smear   . Alcohol abuse   . Anovulation   . Anxiety   . Breast cancer ([place]) 04/05/2016   right breast  . Cancer ([place]) 2017   right breast cancer  . Complication of anesthesia    slow to wake up  . Depression   . Endometrial hyperplasia   . Endometrial polyp    h/o  . Epidermal cyst    h/o  . Gallbladder problem   . GERD (gastroesophageal reflux disease)   . H/O hemorrhoids   . H/O menorrhagia   . High risk HPV infection   . Hyperlipidemia   . Insomnia   . Obesity   . Oligomenorrhea   . Prediabetes   . Restless legs syndrome   . Seasonal allergies   . Vitamin D deficiency     PAST SURGICAL HISTORY: Past Surgical History:  Procedure Laterality Date  . BREAST LUMPECTOMY Right 05/2016   radiation  . BREAST LUMPECTOMY WITH RADIOACTIVE SEED AND SENTINEL LYMPH NODE BIOPSY Right 05/28/2016   Procedure: RADIOACTIVE SEED GUIDED  RIGHT BREAST LUMPECTOMY WITH  RIGHT AXILLARY SENTINEL LYMPH NODE BIOPSY;  Surgeon: Candace Bookbinder, MD;  Location: [facility];  Service: General;  Laterality: Right;  . CARPAL TUNNEL RELEASE  03/26/2011   right hand  . CARPAL TUNNEL RELEASE  05/28/2011   Procedure: CARPAL TUNNEL RELEASE;  Surgeon: Candace [name];  Location: [facility];  Service: Orthopedics;  Laterality: Left;  . CHOLECYSTECTOMY N/A 01/24/2013   Procedure: LAPAROSCOPIC CHOLECYSTECTOMY WITH INTRAOPERATIVE CHOLANGIOGRAM;  Surgeon: Candace [name]. Candace Dover, MD;  Location: [facility];  Service: General;  Laterality: N/A;  . COLONOSCOPY    . HYSTEROSCOPY    . POLYPECTOMY    . TONSILLECTOMY  1967   as child  . trigger fingers  8 yrs. ago   both done months apart  . uterine ablation  06/04/2010  . uterine ablation       SOCIAL HISTORY: Social History   Tobacco Use  . Smoking status: Never Smoker  . Smokeless tobacco: Never Used  Substance Use Topics  . Alcohol use: No    Comment: none since 1996  . Drug use: No    FAMILY HISTORY: Family History  Problem Relation Age of Onset  . Heart disease Mother   . Hypertension Mother   . Stroke Mother   . Dementia Mother        vascular dementia  . Hyperlipidemia Mother   . Depression Mother   . Anxiety disorder Mother   . Alcohol abuse Mother   . Obesity Mother   . Pulmonary fibrosis Father        d. 77  . Depression Father   . Anxiety disorder Father   . Alcohol abuse Father   . Heart attack Maternal Grandmother        d. [phone]  . Heart disease Maternal Grandmother   . Heart disease Maternal Grandfather        d. 60-63  . Leukemia Paternal Grandmother        d. late 37s  . Breast cancer Sister 9       IDC s/p mastectomy and chemo    ROS: Review of Systems  Constitutional: Positive for weight loss.  Gastrointestinal: Negative for nausea and vomiting.  Musculoskeletal:       Negative for muscle weakness  Endo/Heme/Allergies:       Negative for polyphagia Negative for hypoglycemia    PHYSICAL EXAM: Blood pressure (!) 143/81, pulse 82, temperature 97.7 F (36.5 C), temperature source Oral, height 5\' 7"  (1.702 m), weight 228 lb (103.4 kg), SpO2 96 %. Body mass index is 35.71 kg/m. Physical Exam  Constitutional: She is oriented to person, place, and time. She appears well-developed and well-nourished.  HENT:  Head: Normocephalic.  Cardiovascular: Normal rate.  Pulmonary/Chest: Effort normal.  Musculoskeletal: Normal range of motion.  Neurological: She is oriented to person, place, and time.  Skin: Skin is warm and dry.  Psychiatric: She has a normal mood and affect. Her behavior is normal.  Vitals reviewed.   RECENT LABS AND TESTS: BMET    Component Value Date/Time   NA 139 02/18/2018 1011   NA 141 04/16/2017 1409    K 4.1 02/18/2018 1011   K 3.8 04/16/2017 1409   CL 100 02/18/2018 1011   CO2 26 02/18/2018 1011   CO2 25 04/16/2017 1409   GLUCOSE 105 (H) 02/18/2018 1011   GLUCOSE 79 11/11/2017 1127   GLUCOSE 134 04/16/2017 1409   BUN 15 02/18/2018 1011   BUN 12.4 04/16/2017 1409  CREATININE 0.77 02/18/2018 1011   CREATININE 0.9 04/16/2017 1409   CALCIUM 9.9 02/18/2018 1011   CALCIUM 9.6 04/16/2017 1409   GFRNONAA 87 02/18/2018 1011   GFRAA 100 02/18/2018 1011   Lab Results  Component Value Date   HGBA1C 5.9 (H) 02/18/2018   HGBA1C 6.2 (H) 09/17/2017   Lab Results  Component Value Date   INSULIN 15.2 02/18/2018   INSULIN 24.2 09/17/2017   CBC    Component Value Date/Time   WBC 6.8 11/11/2017 1127   RBC 5.02 11/11/2017 1127   HGB 14.2 11/11/2017 1127   HGB 13.7 04/16/2017 1409   HCT 42.2 11/11/2017 1127   HCT 41.2 04/16/2017 1409   PLT 248 11/11/2017 1127   PLT 259 04/16/2017 1409   MCV 84.1 11/11/2017 1127   MCV 84.4 04/16/2017 1409   MCH 28.2 11/11/2017 1127   MCHC 33.5 11/11/2017 1127   RDW 13.1 11/11/2017 1127   RDW 13.2 04/16/2017 1409   LYMPHSABS 1.7 11/11/2017 1127   LYMPHSABS 1.9 04/16/2017 1409   MONOABS 0.8 11/11/2017 1127   MONOABS 0.6 04/16/2017 1409   EOSABS 0.3 11/11/2017 1127   EOSABS 0.1 04/16/2017 1409   BASOSABS 0.0 11/11/2017 1127   BASOSABS 0.0 04/16/2017 1409   Iron/TIBC/Ferritin/ %Sat No results found for: IRON, TIBC, FERRITIN, IRONPCTSAT Lipid Panel     Component Value Date/Time   CHOL 181 02/18/2018 1011   TRIG 222 (H) 02/18/2018 1011   HDL 49 02/18/2018 1011   LDLCALC 88 02/18/2018 1011   Hepatic Function Panel     Component Value Date/Time   PROT 7.2 02/18/2018 1011   PROT 7.0 04/16/2017 1409   ALBUMIN 4.5 02/18/2018 1011   ALBUMIN 4.0 04/16/2017 1409   AST 15 02/18/2018 1011   AST 29 04/16/2017 1409   ALT 20 02/18/2018 1011   ALT 34 04/16/2017 1409   ALKPHOS 94 02/18/2018 1011   ALKPHOS 74 04/16/2017 1409   BILITOT 0.3  02/18/2018 1011   BILITOT 0.40 04/16/2017 1409      Component Value Date/Time   TSH 2.390 09/17/2017 1140    ASSESSMENT AND PLAN: Vitamin D deficiency - Plan: Vitamin D, Ergocalciferol, (DRISDOL) 50000 units CAPS capsule  Prediabetes - Plan: metFORMIN (GLUCOPHAGE) 500 MG tablet  At risk for osteoporosis  Class 2 severe obesity with serious comorbidity and body mass index (BMI) of 35.0 to 35.9 in adult, unspecified obesity type ([place])  PLAN: Vitamin D Deficiency Buena was informed that low vitamin D levels contributes to fatigue and are associated with obesity, breast, and colon cancer. She agrees to continue to take prescription Vit D @50 ,000 IU every week #4 with no refills  and will follow up for routine testing of vitamin D, at least 2-3 times per year. She was informed of the risk of over-replacement of vitamin D and agrees to not increase her dose unless she discusses this with [name] first. She agrees to follow up in our clinic as directed.   Pre-Diabetes Florena will continue to work on weight loss, exercise, and decreasing simple carbohydrates in her diet to help decrease the risk of diabetes. We dicussed metformin including benefits and risks. She was informed that eating too many simple carbohydrates or too many calories at one sitting increases the likelihood of GI side effects. Etty agrees to continue metformin 500 mg daily #30 with no refills. Gianne agreed to follow up with [name] as directed to monitor her progress.  At risk for osteopenia and osteoporosis Bailea was given extended  (  15 minutes) osteoporosis prevention counseling today. Jimmye is at risk for osteopenia and osteoporosis due to her vitamin D deficiency. She was encouraged to take her vitamin D and follow her higher calcium diet and increase strengthening exercise to help strengthen her bones and decrease her risk of osteopenia and osteoporosis.  Obesity Gaynell is currently in the action stage of change. As such,  her goal is to continue with weight loss efforts She has agreed to keep a food journal with 1700-1800 calories and 100 g of  protein  Brihanna has been instructed to work up to a goal of 150 minutes of combined cardio and strengthening exercise per week for weight loss and overall health benefits. We discussed the following Behavioral Modification Strategies today: work on meal planning and easy cooking plans and keeping healthy foods in the home.    Sherran has agreed to follow up with our clinic in 3 weeks. She was informed of the importance of frequent follow up visits to maximize her success with intensive lifestyle modifications for her multiple health conditions.   OBESITY BEHAVIORAL INTERVENTION VISIT  Today's visit was # 11   Starting weight: 249 lb Starting date: 09/17/17 Today's weight : 228 lb Today's date: 03/24/2018 Total lbs lost to date: 21 lb    ASK: We discussed the diagnosis of obesity with Leland Johns today and Kaityln agreed to give [name] permission to discuss obesity behavioral modification therapy today.  ASSESS: Amylia has the diagnosis of obesity and her BMI today is 35.7 Sanskriti is in the action stage of change   ADVISE: Lovelee was educated on the multiple health risks of obesity as well as the benefit of weight loss to improve her health. She was advised of the need for long term treatment and the importance of lifestyle modifications to improve her current health and to decrease her risk of future health problems.  AGREE: Multiple dietary modification options and treatment options were discussed and  Markela agreed to follow the recommendations documented in the above note.  ARRANGE: Rasheka was educated on the importance of frequent visits to treat obesity as outlined per CMS and USPSTF guidelines and agreed to schedule her next follow up appointment today.  Leary Roca, am acting as transcriptionist for [name], PA-C I, [name], PA-C have  reviewed above note and agree with its content

## 2018-03-26 MED FILL — rOPINIRole HCL 0.5 MG TABS: 0.5 | 90 days supply | Qty: 90 | Fill #0

## 2018-04-14 ENCOUNTER — Ambulatory Visit (INDEPENDENT_AMBULATORY_CARE_PROVIDER_SITE_OTHER): Payer: 59 | Admitting: Physician Assistant

## 2018-04-14 ENCOUNTER — Encounter (INDEPENDENT_AMBULATORY_CARE_PROVIDER_SITE_OTHER): Payer: Self-pay | Admitting: Physician Assistant

## 2018-04-14 VITALS — BP 152/86 | HR 84 | Temp 97.6°F | Ht 67.0 in | Wt 233.0 lb

## 2018-04-14 DIAGNOSIS — Z9189 Other specified personal risk factors, not elsewhere classified: Secondary | ICD-10-CM | POA: Diagnosis not present

## 2018-04-14 DIAGNOSIS — E559 Vitamin D deficiency, unspecified: Secondary | ICD-10-CM | POA: Diagnosis not present

## 2018-04-14 DIAGNOSIS — R7303 Prediabetes: Secondary | ICD-10-CM | POA: Diagnosis not present

## 2018-04-14 DIAGNOSIS — Z6836 Body mass index (BMI) 36.0-36.9, adult: Secondary | ICD-10-CM | POA: Diagnosis not present

## 2018-04-14 MED ORDER — VITAMIN D (ERGOCALCIFEROL) 1.25 MG (50000 UNIT) PO CAPS: 50000.0000 [IU] | ORAL_CAPSULE | ORAL | 0 refills | Status: DC

## 2018-04-14 MED ORDER — METFORMIN HCL 500 MG PO TABS: 500.0000 mg | ORAL_TABLET | Freq: Every day | ORAL | 0 refills | Status: DC

## 2018-04-15 NOTE — Progress Notes (Signed)
Office: [phone]  /  Fax: [phone]   HPI:   Chief Complaint: OBESITY Candace [name] is here to discuss her progress with her obesity treatment plan. She is keeping a food journal with 1700 to 1800 calories and 100 grams of protein and is following her eating plan approximately 50 % of the time. She states she is walking 20 minutes 4 times per week. Candace [name] struggled to follow her plan due to craving simple carbs after eating muffins and other simple carbs. She reports a decrease in her energy and lack of motivation. She is ready to get back on track.  Her weight is 233 lb (105.7 kg) today and has had a weight gain of 5 pounds since her last visit. She has lost 16 lbs since starting treatment with [name].  Vitamin D deficiency Candace [name] has a diagnosis of vitamin D deficiency. She is currently taking prescription vit D and denies nausea, vomiting or muscle weakness.   Pre-Diabetes Candace [name] has a diagnosis of pre-diabetes based on her elevated Hgb A1c and was informed this puts her at greater risk of developing diabetes. She is taking metformin currently and continues to work on diet and exercise to decrease risk of diabetes. She denies polyphagia or hypoglycemia. She also denies nausea, vomiting, and diarrhea.  At risk for diabetes Candace [name] is at higher than average risk for developing diabetes due to her pre-diabetes and obesity.   ALLERGIES: No Known Allergies  MEDICATIONS: Current Outpatient Medications on File Prior to Visit  Medication Sig Dispense Refill  . anastrozole (ARIMIDEX) 1 MG tablet TAKE 1 TABLET (1 MG TOTAL) BY MOUTH DAILY. 90 tablet 4  . buPROPion (WELLBUTRIN XL) 300 MG 24 hr tablet Take 300 mg by mouth daily.     . cetirizine (ZYRTEC) 10 MG tablet Take 10 mg by mouth daily. [facility]    . esomeprazole (NEXIUM) 40 MG capsule Take 40 mg by mouth every 36 (thirty-six) hours.    [name] FLUoxetine (PROZAC) 10 MG capsule Take 10 mg by mouth daily with lunch.     .  Multiple Vitamin (MULTIVITAMIN WITH MINERALS) TABS tablet Take 1 tablet by mouth daily.    [name] rOPINIRole (REQUIP) 0.25 MG tablet Take 0.5 mg by mouth daily at 8 pm.    . rosuvastatin (CRESTOR) 10 MG tablet Take 10 mg by mouth at bedtime.     No current facility-administered medications on file prior to visit.     PAST MEDICAL HISTORY: Past Medical History:  Diagnosis Date  . Abnormal Pap smear   . Alcohol abuse   . Anovulation   . Anxiety   . Breast cancer ([place]) 04/05/2016   right breast  . Cancer ([place]) 2017   right breast cancer  . Complication of anesthesia    slow to wake up  . Depression   . Endometrial hyperplasia   . Endometrial polyp    h/o  . Epidermal cyst    h/o  . Gallbladder problem   . GERD (gastroesophageal reflux disease)   . H/O hemorrhoids   . H/O menorrhagia   . High risk HPV infection   . Hyperlipidemia   . Insomnia   . Obesity   . Oligomenorrhea   . Prediabetes   . Restless legs syndrome   . Seasonal allergies   . Vitamin D deficiency     PAST SURGICAL HISTORY: Past Surgical History:  Procedure Laterality Date  . BREAST LUMPECTOMY Right 05/2016   radiation  . BREAST LUMPECTOMY WITH RADIOACTIVE SEED  AND SENTINEL LYMPH NODE BIOPSY Right 05/28/2016   Procedure: RADIOACTIVE SEED GUIDED RIGHT BREAST LUMPECTOMY WITH  RIGHT AXILLARY SENTINEL LYMPH NODE BIOPSY;  Surgeon: [name], MD;  Location: [place];  Service: General;  Laterality: Right;  . CARPAL TUNNEL RELEASE  03/26/2011   right hand  . CARPAL TUNNEL RELEASE  05/28/2011   Procedure: CARPAL TUNNEL RELEASE;  Surgeon: [name];  Location: [facility];  Service: Orthopedics;  Laterality: Left;  . CHOLECYSTECTOMY N/A 01/24/2013   Procedure: LAPAROSCOPIC CHOLECYSTECTOMY WITH INTRAOPERATIVE CHOLANGIOGRAM;  Surgeon: [name], MD;  Location: [facility];  Service: General;  Laterality: N/A;  . COLONOSCOPY    . HYSTEROSCOPY    . POLYPECTOMY    . TONSILLECTOMY  1967   as child  .  trigger fingers  8 yrs. ago   both done months apart  . uterine ablation  06/04/2010  . uterine ablation      SOCIAL HISTORY: Social History   Tobacco Use  . Smoking status: Never Smoker  . Smokeless tobacco: Never Used  Substance Use Topics  . Alcohol use: No    Comment: none since 1996  . Drug use: No    FAMILY HISTORY: Family History  Problem Relation Age of Onset  . Heart disease Mother   . Hypertension Mother   . Stroke Mother   . Dementia Mother        vascular dementia  . Hyperlipidemia Mother   . Depression Mother   . Anxiety disorder Mother   . Alcohol abuse Mother   . Obesity Mother   . Pulmonary fibrosis Father        d. 20  . Depression Father   . Anxiety disorder Father   . Alcohol abuse Father   . Heart attack Maternal Grandmother        d. [phone]  . Heart disease Maternal Grandmother   . Heart disease Maternal Grandfather        d. 60-63  . Leukemia Paternal Grandmother        d. late 5s  . Breast cancer Sister 66       IDC s/p mastectomy and chemo    ROS: Review of Systems  Constitutional: Negative for weight loss.  Gastrointestinal: Negative for nausea and vomiting.  Musculoskeletal:       Negative for muscle weakness.  Endo/Heme/Allergies:       Negative for polyphagia. Negative for hypoglycemia.    PHYSICAL EXAM: Blood pressure (!) 152/86, pulse 84, temperature 97.6 F (36.4 C), temperature source Oral, height 5\' 7"  (1.702 m), weight 233 lb (105.7 kg), SpO2 95 %. Body mass index is 36.49 kg/m. Physical Exam  Constitutional: She is oriented to person, place, and time. She appears well-developed and well-nourished.  Cardiovascular: Normal rate.  Pulmonary/Chest: Effort normal.  Musculoskeletal: Normal range of motion.  Neurological: She is oriented to person, place, and time.  Skin: Skin is warm and dry.  Psychiatric: She has a normal mood and affect. Her behavior is normal.  Vitals reviewed.   RECENT LABS AND TESTS: BMET     Component Value Date/Time   NA 139 02/18/2018 1011   NA 141 04/16/2017 1409   K 4.1 02/18/2018 1011   K 3.8 04/16/2017 1409   CL 100 02/18/2018 1011   CO2 26 02/18/2018 1011   CO2 25 04/16/2017 1409   GLUCOSE 105 (H) 02/18/2018 1011   GLUCOSE 79 11/11/2017 1127   GLUCOSE 134 04/16/2017 1409   BUN 15 02/18/2018 1011  BUN 12.4 04/16/2017 1409   CREATININE 0.77 02/18/2018 1011   CREATININE 0.9 04/16/2017 1409   CALCIUM 9.9 02/18/2018 1011   CALCIUM 9.6 04/16/2017 1409   GFRNONAA 87 02/18/2018 1011   GFRAA 100 02/18/2018 1011   Lab Results  Component Value Date   HGBA1C 5.9 (H) 02/18/2018   HGBA1C 6.2 (H) 09/17/2017   Lab Results  Component Value Date   INSULIN 15.2 02/18/2018   INSULIN 24.2 09/17/2017   CBC    Component Value Date/Time   WBC 6.8 11/11/2017 1127   RBC 5.02 11/11/2017 1127   HGB 14.2 11/11/2017 1127   HGB 13.7 04/16/2017 1409   HCT 42.2 11/11/2017 1127   HCT 41.2 04/16/2017 1409   PLT 248 11/11/2017 1127   PLT 259 04/16/2017 1409   MCV 84.1 11/11/2017 1127   MCV 84.4 04/16/2017 1409   MCH 28.2 11/11/2017 1127   MCHC 33.5 11/11/2017 1127   RDW 13.1 11/11/2017 1127   RDW 13.2 04/16/2017 1409   LYMPHSABS 1.7 11/11/2017 1127   LYMPHSABS 1.9 04/16/2017 1409   MONOABS 0.8 11/11/2017 1127   MONOABS 0.6 04/16/2017 1409   EOSABS 0.3 11/11/2017 1127   EOSABS 0.1 04/16/2017 1409   BASOSABS 0.0 11/11/2017 1127   BASOSABS 0.0 04/16/2017 1409   Iron/TIBC/Ferritin/ %Sat No results found for: IRON, TIBC, FERRITIN, IRONPCTSAT Lipid Panel     Component Value Date/Time   CHOL 181 02/18/2018 1011   TRIG 222 (H) 02/18/2018 1011   HDL 49 02/18/2018 1011   LDLCALC 88 02/18/2018 1011   Hepatic Function Panel     Component Value Date/Time   PROT 7.2 02/18/2018 1011   PROT 7.0 04/16/2017 1409   ALBUMIN 4.5 02/18/2018 1011   ALBUMIN 4.0 04/16/2017 1409   AST 15 02/18/2018 1011   AST 29 04/16/2017 1409   ALT 20 02/18/2018 1011   ALT 34 04/16/2017 1409    ALKPHOS 94 02/18/2018 1011   ALKPHOS 74 04/16/2017 1409   BILITOT 0.3 02/18/2018 1011   BILITOT 0.40 04/16/2017 1409      Component Value Date/Time   TSH 2.390 09/17/2017 1140   Results for MAILLE, HALLIWELL (MRN [id number]) as of 04/15/2018 13:34  Ref. Range 02/18/2018 10:11  Vitamin D, 25-Hydroxy Latest Ref Range: 30.0 - 100.0 ng/mL 43.2   ASSESSMENT AND PLAN: Vitamin D deficiency - Plan: Vitamin D, Ergocalciferol, (DRISDOL) 50000 units CAPS capsule  Prediabetes - Plan: metFORMIN (GLUCOPHAGE) 500 MG tablet  At risk for diabetes mellitus  Class 2 severe obesity with serious comorbidity and body mass index (BMI) of 36.0 to 36.9 in adult, unspecified obesity type (Candace [name])  PLAN:  Vitamin D Deficiency Candace [name] was informed that low vitamin D levels contributes to fatigue and are associated with obesity, breast, and colon cancer. She agrees to continue to take prescription Vit D @50 ,000 IU every week #4 with no refills and will follow up for routine testing of vitamin D, at least 2-3 times per year. She was informed of the risk of over-replacement of vitamin D and agrees to not increase her dose unless she discusses this with [name] first. Candace [name] agrees to follow up in 2 weeks.  Pre-Diabetes Candace [name] will continue to work on weight loss, exercise, and decreasing simple carbohydrates in her diet to help decrease the risk of diabetes. She was informed that eating too many simple carbohydrates or too many calories at one sitting increases the likelihood of GI side effects. Candace [name] agrees to continue taking metformin 500mg  #30 with no  refills and a prescription was written today. Candace [name] agreed to follow up with [name] as directed to monitor her progress.  Diabetes risk counseling Candace [name] was given extended (15 minutes) diabetes prevention counseling today. She is 57 y.o. female and has risk factors for diabetes including pre-diabetes and obesity. We discussed intensive lifestyle modifications today with an emphasis  on weight loss as well as increasing exercise and decreasing simple carbohydrates in her diet.  Obesity Candace [name] is currently in the action stage of change. As such, her goal is to continue with weight loss efforts. She has agreed to change to the Category 4 plan. Candace [name] has been instructed to work up to a goal of 150 minutes of combined cardio and strengthening exercise per week for weight loss and overall health benefits. We discussed the following Behavioral Modification Strategies today: increasing lean protein intake, decreasing simple carbohydrates, work on meal planning and easy cooking plans, and keeping healthy foods in the home.  Candace [name] has agreed to follow up with our clinic in 2 weeks. She was informed of the importance of frequent follow up visits to maximize her success with intensive lifestyle modifications for her multiple health conditions.   OBESITY BEHAVIORAL INTERVENTION VISIT  Today's visit was # 12   Starting weight: 249 lbs Starting date: 09/17/17 Today's weight : Weight: 233 lb (105.7 kg)  Today's date: 04/14/2018 Total lbs lost to date: 16  ASK: We discussed the diagnosis of obesity with Candace [name] today and Candace [name] agreed to give [name] permission to discuss obesity behavioral modification therapy today.  ASSESS: Candace [name] has the diagnosis of obesity and her BMI today is 36.48. Candace [name] is in the action stage of change.   ADVISE: Candace [name] was educated on the multiple health risks of obesity as well as the benefit of weight loss to improve her health. She was advised of the need for long term treatment and the importance of lifestyle modifications to improve her current health and to decrease her risk of future health problems.  AGREE: Multiple dietary modification options and treatment options were discussed and Natalye agreed to follow the recommendations documented in the above note.  ARRANGE: Talisha was educated on the importance of frequent visits to treat obesity  as outlined per CMS and USPSTF guidelines and agreed to schedule her next follow up appointment today.  Lenward Chancellor, am acting as transcriptionist for [name], PA-C I, [name], PA-C have reviewed above note and agree with its content

## 2018-04-29 ENCOUNTER — Ambulatory Visit (INDEPENDENT_AMBULATORY_CARE_PROVIDER_SITE_OTHER): Payer: 59 | Admitting: Physician Assistant

## 2018-04-29 ENCOUNTER — Encounter (INDEPENDENT_AMBULATORY_CARE_PROVIDER_SITE_OTHER): Payer: Self-pay | Admitting: Physician Assistant

## 2018-04-29 VITALS — BP 140/83 | HR 94 | Temp 98.3°F | Ht 67.0 in | Wt 232.0 lb

## 2018-04-29 DIAGNOSIS — E559 Vitamin D deficiency, unspecified: Secondary | ICD-10-CM

## 2018-04-29 DIAGNOSIS — Z6836 Body mass index (BMI) 36.0-36.9, adult: Secondary | ICD-10-CM

## 2018-04-29 NOTE — Progress Notes (Signed)
Office: [phone]  /  Fax: [phone]   HPI:   Chief Complaint: OBESITY Candace [name] is here to discuss her progress with her obesity treatment plan. She is on the  follow the Category 4 plan and is following her eating plan approximately 70 % of the time. She states she is exercising 0 minutes 0 times per week. Candace [name] did well with weight loss. She reports that she is still lacking some motivation and not getting all her food in.   Her weight is 232 lb (105.2 kg) today and has had a weight loss of 1 pounds over a period of 2 weeks since her last visit. She has lost 17 lbs since starting treatment with [name].  Vitamin D deficiency Candace [name] has a diagnosis of vitamin D deficiency. She is currently taking vit D and denies nausea, vomiting or muscle weakness.   ALLERGIES: No Known Allergies  MEDICATIONS: Current Outpatient Medications on File Prior to Visit  Medication Sig Dispense Refill  . anastrozole (ARIMIDEX) 1 MG tablet TAKE 1 TABLET (1 MG TOTAL) BY MOUTH DAILY. 90 tablet 4  . buPROPion (WELLBUTRIN XL) 300 MG 24 hr tablet Take 300 mg by mouth daily.     . cetirizine (ZYRTEC) 10 MG tablet Take 10 mg by mouth daily. [facility]    . esomeprazole (NEXIUM) 40 MG capsule Take 40 mg by mouth every 36 (thirty-six) hours.    [name] FLUoxetine (PROZAC) 10 MG capsule Take 10 mg by mouth daily with lunch.     . metFORMIN (GLUCOPHAGE) 500 MG tablet Take 1 tablet (500 mg total) by mouth daily with breakfast. 30 tablet 0  . Multiple Vitamin (MULTIVITAMIN WITH MINERALS) TABS tablet Take 1 tablet by mouth daily.    [name] rOPINIRole (REQUIP) 0.25 MG tablet Take 0.5 mg by mouth daily at 8 pm.    . rosuvastatin (CRESTOR) 10 MG tablet Take 10 mg by mouth at bedtime.    . Vitamin D, Ergocalciferol, (DRISDOL) 50000 units CAPS capsule Take 1 capsule (50,000 Units total) by mouth every 7 (seven) days. 4 capsule 0   No current facility-administered medications on file prior to visit.     PAST  MEDICAL HISTORY: Past Medical History:  Diagnosis Date  . Abnormal Pap smear   . Alcohol abuse   . Anovulation   . Anxiety   . Breast cancer ([place]) 04/05/2016   right breast  . Cancer ([place]) 2017   right breast cancer  . Complication of anesthesia    slow to wake up  . Depression   . Endometrial hyperplasia   . Endometrial polyp    h/o  . Epidermal cyst    h/o  . Gallbladder problem   . GERD (gastroesophageal reflux disease)   . H/O hemorrhoids   . H/O menorrhagia   . High risk HPV infection   . Hyperlipidemia   . Insomnia   . Obesity   . Oligomenorrhea   . Prediabetes   . Restless legs syndrome   . Seasonal allergies   . Vitamin D deficiency     PAST SURGICAL HISTORY: Past Surgical History:  Procedure Laterality Date  . BREAST LUMPECTOMY Right 05/2016   radiation  . BREAST LUMPECTOMY WITH RADIOACTIVE SEED AND SENTINEL LYMPH NODE BIOPSY Right 05/28/2016   Procedure: RADIOACTIVE SEED GUIDED RIGHT BREAST LUMPECTOMY WITH  RIGHT AXILLARY SENTINEL LYMPH NODE BIOPSY;  Surgeon: [name], MD;  Location: [facility];  Service: General;  Laterality: Right;  . CARPAL TUNNEL RELEASE  03/26/2011  right hand  . CARPAL TUNNEL RELEASE  05/28/2011   Procedure: CARPAL TUNNEL RELEASE;  Surgeon: [name];  Location: [facility];  Service: Orthopedics;  Laterality: Left;  . CHOLECYSTECTOMY N/A 01/24/2013   Procedure: LAPAROSCOPIC CHOLECYSTECTOMY WITH INTRAOPERATIVE CHOLANGIOGRAM;  Surgeon: [name], MD;  Location: [facility];  Service: General;  Laterality: N/A;  . COLONOSCOPY    . HYSTEROSCOPY    . POLYPECTOMY    . TONSILLECTOMY  1967   as child  . trigger fingers  8 yrs. ago   both done months apart  . uterine ablation  06/04/2010  . uterine ablation      SOCIAL HISTORY: Social History   Tobacco Use  . Smoking status: Never Smoker  . Smokeless tobacco: Never Used  Substance Use Topics  . Alcohol use: No    Comment: none since 1996  . Drug use: No     FAMILY HISTORY: Family History  Problem Relation Age of Onset  . Heart disease Mother   . Hypertension Mother   . Stroke Mother   . Dementia Mother        vascular dementia  . Hyperlipidemia Mother   . Depression Mother   . Anxiety disorder Mother   . Alcohol abuse Mother   . Obesity Mother   . Pulmonary fibrosis Father        d. 5  . Depression Father   . Anxiety disorder Father   . Alcohol abuse Father   . Heart attack Maternal Grandmother        d. [phone]  . Heart disease Maternal Grandmother   . Heart disease Maternal Grandfather        d. 60-63  . Leukemia Paternal Grandmother        d. late 46s  . Breast cancer Sister 39       IDC s/p mastectomy and chemo    ROS: Review of Systems  Constitutional: Positive for weight loss.  All other systems reviewed and are negative.   PHYSICAL EXAM: Blood pressure 140/83, pulse 94, temperature 98.3 F (36.8 C), temperature source Oral, height 5\' 7"  (1.702 m), weight 232 lb (105.2 kg), SpO2 90 %. Body mass index is 36.34 kg/m. Physical Exam  Constitutional: She is oriented to person, place, and time. She appears well-developed and well-nourished.  HENT:  Head: Normocephalic.  Eyes: Pupils are equal, round, and reactive to light.  Neck: Normal range of motion.  Pulmonary/Chest: Effort normal.  Neurological: She is alert and oriented to person, place, and time.  Skin: Skin is warm and dry.  Psychiatric: She has a normal mood and affect.    RECENT LABS AND TESTS: BMET    Component Value Date/Time   NA 139 02/18/2018 1011   NA 141 04/16/2017 1409   K 4.1 02/18/2018 1011   K 3.8 04/16/2017 1409   CL 100 02/18/2018 1011   CO2 26 02/18/2018 1011   CO2 25 04/16/2017 1409   GLUCOSE 105 (H) 02/18/2018 1011   GLUCOSE 79 11/11/2017 1127   GLUCOSE 134 04/16/2017 1409   BUN 15 02/18/2018 1011   BUN 12.4 04/16/2017 1409   CREATININE 0.77 02/18/2018 1011   CREATININE 0.9 04/16/2017 1409   CALCIUM 9.9 02/18/2018 1011    CALCIUM 9.6 04/16/2017 1409   GFRNONAA 87 02/18/2018 1011   GFRAA 100 02/18/2018 1011   Lab Results  Component Value Date   HGBA1C 5.9 (H) 02/18/2018   HGBA1C 6.2 (H) 09/17/2017   Lab Results  Component  Value Date   INSULIN 15.2 02/18/2018   INSULIN 24.2 09/17/2017   CBC    Component Value Date/Time   WBC 6.8 11/11/2017 1127   RBC 5.02 11/11/2017 1127   HGB 14.2 11/11/2017 1127   HGB 13.7 04/16/2017 1409   HCT 42.2 11/11/2017 1127   HCT 41.2 04/16/2017 1409   PLT 248 11/11/2017 1127   PLT 259 04/16/2017 1409   MCV 84.1 11/11/2017 1127   MCV 84.4 04/16/2017 1409   MCH 28.2 11/11/2017 1127   MCHC 33.5 11/11/2017 1127   RDW 13.1 11/11/2017 1127   RDW 13.2 04/16/2017 1409   LYMPHSABS 1.7 11/11/2017 1127   LYMPHSABS 1.9 04/16/2017 1409   MONOABS 0.8 11/11/2017 1127   MONOABS 0.6 04/16/2017 1409   EOSABS 0.3 11/11/2017 1127   EOSABS 0.1 04/16/2017 1409   BASOSABS 0.0 11/11/2017 1127   BASOSABS 0.0 04/16/2017 1409   Iron/TIBC/Ferritin/ %Sat No results found for: IRON, TIBC, FERRITIN, IRONPCTSAT Lipid Panel     Component Value Date/Time   CHOL 181 02/18/2018 1011   TRIG 222 (H) 02/18/2018 1011   HDL 49 02/18/2018 1011   LDLCALC 88 02/18/2018 1011   Hepatic Function Panel     Component Value Date/Time   PROT 7.2 02/18/2018 1011   PROT 7.0 04/16/2017 1409   ALBUMIN 4.5 02/18/2018 1011   ALBUMIN 4.0 04/16/2017 1409   AST 15 02/18/2018 1011   AST 29 04/16/2017 1409   ALT 20 02/18/2018 1011   ALT 34 04/16/2017 1409   ALKPHOS 94 02/18/2018 1011   ALKPHOS 74 04/16/2017 1409   BILITOT 0.3 02/18/2018 1011   BILITOT 0.40 04/16/2017 1409      Component Value Date/Time   TSH 2.390 09/17/2017 1140    ASSESSMENT AND PLAN: Vitamin D deficiency  Class 2 severe obesity with serious comorbidity and body mass index (BMI) of 36.0 to 36.9 in adult, unspecified obesity type ([facility])  PLAN: Vitamin D Deficiency Khadeeja was informed that low vitamin D levels contributes  to fatigue and are associated with obesity, breast, and colon cancer. She agrees to continue to take prescription Vit D @50 ,000 IU every week and will follow up for routine testing of vitamin D, at least 2-3 times per year. She was informed of the risk of over-replacement of vitamin D and agrees to not increase her dose unless she discusses this with [name] first.  Obesity Kristianne is currently in the action stage of change. As such, her goal is to continue with weight loss efforts She has agreed to follow the Category 2 plan daily.  Kimiyo has been instructed to work up to a goal of 150 minutes of combined cardio and strengthening exercise per week for weight loss and overall health benefits. We discussed the following Behavioral Modification Stratagies today: work on meal planning and easy cooking plans and decrease snacking   I spent > than 50% of the 15 minute visit on counseling as documented in the note.  Aricka has agreed to follow up with our clinic in 2 weeks. She was informed of the importance of frequent follow up visits to maximize her success with intensive lifestyle modifications for her multiple health conditions.   OBESITY BEHAVIORAL INTERVENTION VISIT  Today's visit was # 13   Starting weight: 249 lbs Starting date: 09/17/17 Today's weight : Weight: 232 lb (105.2 kg)  Today's date: 04/29/2018 Total lbs lost to date: 28    ASK: We discussed the diagnosis of obesity with Leland Johns today and  Regana agreed to give [name] permission to discuss obesity behavioral modification therapy today.  ASSESS: Nanetta has the diagnosis of obesity and her BMI today is 36.4 Orva is in the action stage of change   ADVISE: Cherie was educated on the multiple health risks of obesity as well as the benefit of weight loss to improve her health. She was advised of the need for long term treatment and the importance of lifestyle modifications to improve her current health and to decrease her risk  of future health problems.  AGREE: Multiple dietary modification options and treatment options were discussed and  Gwendolynn agreed to follow the recommendations documented in the above note.  ARRANGE: Malia was educated on the importance of frequent visits to treat obesity as outlined per CMS and USPSTF guidelines and agreed to schedule her next follow up appointment today.  I, April Moore, am acting as Location manager for [employer] PA-C

## 2018-05-01 DIAGNOSIS — F3341 Major depressive disorder, recurrent, in partial remission: Secondary | ICD-10-CM | POA: Diagnosis not present

## 2018-05-01 DIAGNOSIS — E785 Hyperlipidemia, unspecified: Secondary | ICD-10-CM | POA: Diagnosis not present

## 2018-05-01 DIAGNOSIS — R7303 Prediabetes: Secondary | ICD-10-CM | POA: Diagnosis not present

## 2018-05-01 DIAGNOSIS — R030 Elevated blood-pressure reading, without diagnosis of hypertension: Secondary | ICD-10-CM | POA: Diagnosis not present

## 2018-05-01 MED FILL — FLUoxetine HCL 10 MG CAPS: 10 | 90 days supply | Qty: 90 | Fill #0

## 2018-05-01 MED FILL — buPROPion HCL ER (XL) 150 M: 150 | 90 days supply | Qty: 90 | Fill #0

## 2018-05-08 MED FILL — metFORMIN HCL 500 MG TABS: 500 | 30 days supply | Qty: 30 | Fill #0

## 2018-05-08 MED FILL — VIT D2 1.25 MG (50,000 UNIT: 1.25 MG | 28 days supply | Qty: 4 | Fill #0

## 2018-05-13 ENCOUNTER — Encounter (INDEPENDENT_AMBULATORY_CARE_PROVIDER_SITE_OTHER): Payer: Self-pay | Admitting: Physician Assistant

## 2018-05-13 ENCOUNTER — Ambulatory Visit (INDEPENDENT_AMBULATORY_CARE_PROVIDER_SITE_OTHER): Payer: 59 | Admitting: Physician Assistant

## 2018-05-13 VITALS — BP 164/88 | HR 76 | Temp 97.9°F | Ht 67.0 in | Wt 232.0 lb

## 2018-05-13 DIAGNOSIS — E7849 Other hyperlipidemia: Secondary | ICD-10-CM

## 2018-05-13 DIAGNOSIS — Z6836 Body mass index (BMI) 36.0-36.9, adult: Secondary | ICD-10-CM | POA: Diagnosis not present

## 2018-05-13 NOTE — Progress Notes (Signed)
Office: [phone]  /  Fax: [phone]   HPI:   Chief Complaint: OBESITY Lesta is here to discuss her progress with her obesity treatment plan. She is on the Category 4 plan and is following her eating plan approximately 80 % of the time. She states she is doing cardio and exercising with a personal trainer for 60 minutes 1 time per week. Preciosa did well with weight maintenance. She reports that she has been cooking at home more. She has also started working out with a Clinical research associate.  Her weight is 232 lb (105.2 kg) today and has not lost weight since her last visit. She has lost 17 lbs since starting treatment with [name].  Hyperlipidemia Rodina has hyperlipidemia and has been trying to improve her cholesterol levels with intensive lifestyle modification including a low saturated fat diet, exercise and weight loss. She is on Crestor and denies any chest pain, claudication or myalgias.  ALLERGIES: No Known Allergies  MEDICATIONS: Current Outpatient Medications on File Prior to Visit  Medication Sig Dispense Refill  . anastrozole (ARIMIDEX) 1 MG tablet TAKE 1 TABLET (1 MG TOTAL) BY MOUTH DAILY. 90 tablet 4  . buPROPion (WELLBUTRIN XL) 300 MG 24 hr tablet Take 300 mg by mouth daily.     . cetirizine (ZYRTEC) 10 MG tablet Take 10 mg by mouth daily. [facility]    . esomeprazole (NEXIUM) 40 MG capsule Take 40 mg by mouth every 36 (thirty-six) hours.    [name] FLUoxetine (PROZAC) 10 MG capsule Take 10 mg by mouth daily with lunch.     . metFORMIN (GLUCOPHAGE) 500 MG tablet Take 1 tablet (500 mg total) by mouth daily with breakfast. 30 tablet 0  . Multiple Vitamin (MULTIVITAMIN WITH MINERALS) TABS tablet Take 1 tablet by mouth daily.    [name] rOPINIRole (REQUIP) 0.25 MG tablet Take 0.5 mg by mouth daily at 8 pm.    . rosuvastatin (CRESTOR) 10 MG tablet Take 10 mg by mouth at bedtime.    . Vitamin D, Ergocalciferol, (DRISDOL) 50000 units CAPS capsule Take 1 capsule (50,000 Units total)  by mouth every 7 (seven) days. 4 capsule 0   No current facility-administered medications on file prior to visit.     PAST MEDICAL HISTORY: Past Medical History:  Diagnosis Date  . Abnormal Pap smear   . Alcohol abuse   . Anovulation   . Anxiety   . Breast cancer ([place]) 04/05/2016   right breast  . Cancer ([name]) 2017   right breast cancer  . Complication of anesthesia    slow to wake up  . Depression   . Endometrial hyperplasia   . Endometrial polyp    h/o  . Epidermal cyst    h/o  . Gallbladder problem   . GERD (gastroesophageal reflux disease)   . H/O hemorrhoids   . H/O menorrhagia   . High risk HPV infection   . Hyperlipidemia   . Insomnia   . Obesity   . Oligomenorrhea   . Prediabetes   . Restless legs syndrome   . Seasonal allergies   . Vitamin D deficiency     PAST SURGICAL HISTORY: Past Surgical History:  Procedure Laterality Date  . BREAST LUMPECTOMY Right 05/2016   radiation  . BREAST LUMPECTOMY WITH RADIOACTIVE SEED AND SENTINEL LYMPH NODE BIOPSY Right 05/28/2016   Procedure: RADIOACTIVE SEED GUIDED RIGHT BREAST LUMPECTOMY WITH  RIGHT AXILLARY SENTINEL LYMPH NODE BIOPSY;  Surgeon: [name], MD;  Location: [place];  Service: General;  Laterality: Right;  . CARPAL TUNNEL RELEASE  03/26/2011   right hand  . CARPAL TUNNEL RELEASE  05/28/2011   Procedure: CARPAL TUNNEL RELEASE;  Surgeon: [name];  Location: [facility];  Service: Orthopedics;  Laterality: Left;  . CHOLECYSTECTOMY N/A 01/24/2013   Procedure: LAPAROSCOPIC CHOLECYSTECTOMY WITH INTRAOPERATIVE CHOLANGIOGRAM;  Surgeon: [name], MD;  Location: [place];  Service: General;  Laterality: N/A;  . COLONOSCOPY    . HYSTEROSCOPY    . POLYPECTOMY    . TONSILLECTOMY  1967   as child  . trigger fingers  8 yrs. ago   both done months apart  . uterine ablation  06/04/2010  . uterine ablation      SOCIAL HISTORY: Social History   Tobacco Use  . Smoking status: Never Smoker  .  Smokeless tobacco: Never Used  Substance Use Topics  . Alcohol use: No    Comment: none since 1996  . Drug use: No    FAMILY HISTORY: Family History  Problem Relation Age of Onset  . Heart disease Mother   . Hypertension Mother   . Stroke Mother   . Dementia Mother        vascular dementia  . Hyperlipidemia Mother   . Depression Mother   . Anxiety disorder Mother   . Alcohol abuse Mother   . Obesity Mother   . Pulmonary fibrosis Father        d. 26  . Depression Father   . Anxiety disorder Father   . Alcohol abuse Father   . Heart attack Maternal Grandmother        d. [phone]  . Heart disease Maternal Grandmother   . Heart disease Maternal Grandfather        d. 60-63  . Leukemia Paternal Grandmother        d. late 76s  . Breast cancer Sister 42       IDC s/p mastectomy and chemo    ROS: Review of Systems  Constitutional: Negative for weight loss.  Cardiovascular: Negative for chest pain and claudication.  Musculoskeletal: Negative for myalgias.    PHYSICAL EXAM: Blood pressure (!) 164/88, pulse 76, temperature 97.9 F (36.6 C), temperature source Oral, height 5\' 7"  (1.702 m), weight 232 lb (105.2 kg), SpO2 96 %. Body mass index is 36.34 kg/m. Physical Exam  Constitutional: She is oriented to person, place, and time. She appears well-developed and well-nourished.  Cardiovascular: Normal rate.  Pulmonary/Chest: Effort normal.  Musculoskeletal: Normal range of motion.  Neurological: She is oriented to person, place, and time.  Skin: Skin is warm and dry.  Psychiatric: She has a normal mood and affect. Her behavior is normal.  Vitals reviewed.   RECENT LABS AND TESTS: BMET    Component Value Date/Time   NA 139 02/18/2018 1011   NA 141 04/16/2017 1409   K 4.1 02/18/2018 1011   K 3.8 04/16/2017 1409   CL 100 02/18/2018 1011   CO2 26 02/18/2018 1011   CO2 25 04/16/2017 1409   GLUCOSE 105 (H) 02/18/2018 1011   GLUCOSE 79 11/11/2017 1127   GLUCOSE 134  04/16/2017 1409   BUN 15 02/18/2018 1011   BUN 12.4 04/16/2017 1409   CREATININE 0.77 02/18/2018 1011   CREATININE 0.9 04/16/2017 1409   CALCIUM 9.9 02/18/2018 1011   CALCIUM 9.6 04/16/2017 1409   GFRNONAA 87 02/18/2018 1011   GFRAA 100 02/18/2018 1011   Lab Results  Component Value Date   HGBA1C 5.9 (H) 02/18/2018  HGBA1C 6.2 (H) 09/17/2017   Lab Results  Component Value Date   INSULIN 15.2 02/18/2018   INSULIN 24.2 09/17/2017   CBC    Component Value Date/Time   WBC 6.8 11/11/2017 1127   RBC 5.02 11/11/2017 1127   HGB 14.2 11/11/2017 1127   HGB 13.7 04/16/2017 1409   HCT 42.2 11/11/2017 1127   HCT 41.2 04/16/2017 1409   PLT 248 11/11/2017 1127   PLT 259 04/16/2017 1409   MCV 84.1 11/11/2017 1127   MCV 84.4 04/16/2017 1409   MCH 28.2 11/11/2017 1127   MCHC 33.5 11/11/2017 1127   RDW 13.1 11/11/2017 1127   RDW 13.2 04/16/2017 1409   LYMPHSABS 1.7 11/11/2017 1127   LYMPHSABS 1.9 04/16/2017 1409   MONOABS 0.8 11/11/2017 1127   MONOABS 0.6 04/16/2017 1409   EOSABS 0.3 11/11/2017 1127   EOSABS 0.1 04/16/2017 1409   BASOSABS 0.0 11/11/2017 1127   BASOSABS 0.0 04/16/2017 1409   Iron/TIBC/Ferritin/ %Sat No results found for: IRON, TIBC, FERRITIN, IRONPCTSAT Lipid Panel     Component Value Date/Time   CHOL 181 02/18/2018 1011   TRIG 222 (H) 02/18/2018 1011   HDL 49 02/18/2018 1011   LDLCALC 88 02/18/2018 1011   Hepatic Function Panel     Component Value Date/Time   PROT 7.2 02/18/2018 1011   PROT 7.0 04/16/2017 1409   ALBUMIN 4.5 02/18/2018 1011   ALBUMIN 4.0 04/16/2017 1409   AST 15 02/18/2018 1011   AST 29 04/16/2017 1409   ALT 20 02/18/2018 1011   ALT 34 04/16/2017 1409   ALKPHOS 94 02/18/2018 1011   ALKPHOS 74 04/16/2017 1409   BILITOT 0.3 02/18/2018 1011   BILITOT 0.40 04/16/2017 1409      Component Value Date/Time   TSH 2.390 09/17/2017 1140    ASSESSMENT AND PLAN: Other hyperlipidemia  Class 2 severe obesity with serious comorbidity and  body mass index (BMI) of 36.0 to 36.9 in adult, unspecified obesity type (HCC)  PLAN:  Hyperlipidemia Kammie was informed of the American Heart Association Guidelines emphasizing intensive lifestyle modifications as the first line treatment for hyperlipidemia. We discussed many lifestyle modifications today in depth, and Aneisha will continue to work on decreasing saturated fats such as fatty red meat, butter and many fried foods. Winona agrees to continue taking Crestor, and she will also increase vegetables and lean protein in her diet and continue to work on diet, exercise, and weight loss efforts. Jinger agrees to follow up with our clinic in 3 weeks.  I spent > than 50% of the 15 minute visit on counseling as documented in the note.  Obesity Jennea is currently in the action stage of change. As such, her goal is to continue with weight loss efforts She has agreed to follow the Category 4 plan Khamia has been instructed to work up to a goal of 150 minutes of combined cardio and strengthening exercise per week for weight loss and overall health benefits. We discussed the following Behavioral Modification Strategies today: increasing lean protein intake and work on meal planning and easy cooking plans   Kimesha has agreed to follow up with our clinic in 3 weeks. She was informed of the importance of frequent follow up visits to maximize her success with intensive lifestyle modifications for her multiple health conditions.   OBESITY BEHAVIORAL INTERVENTION VISIT  Today's visit was # 14   Starting weight: 249 lbs Starting date: 09/17/17 Today's weight : 232 lbs  Today's date: 05/13/2018 Total lbs lost  to date: 42    ASK: We discussed the diagnosis of obesity with Leland Johns today and Teryl agreed to give [name] permission to discuss obesity behavioral modification therapy today.  ASSESS: Tymesha has the diagnosis of obesity and her BMI today is 36.33 Cierah is in the action stage of  change   ADVISE: Maryellen was educated on the multiple health risks of obesity as well as the benefit of weight loss to improve her health. She was advised of the need for long term treatment and the importance of lifestyle modifications.  AGREE: Multiple dietary modification options and treatment options were discussed and  Hadlei agreed to the above obesity treatment plan.  Wilhemena Durie, am acting as transcriptionist for [name], PA-C I, [name], PA-C have reviewed above note and agree with its content

## 2018-06-02 MED FILL — ESOMEPRAZOLE MAG DR 40 MG C: 40 | 90 days supply | Qty: 90 | Fill #2

## 2018-06-03 ENCOUNTER — Encounter (INDEPENDENT_AMBULATORY_CARE_PROVIDER_SITE_OTHER): Payer: Self-pay

## 2018-06-03 ENCOUNTER — Ambulatory Visit (INDEPENDENT_AMBULATORY_CARE_PROVIDER_SITE_OTHER): Payer: 59 | Admitting: Family Medicine

## 2018-06-04 ENCOUNTER — Ambulatory Visit (INDEPENDENT_AMBULATORY_CARE_PROVIDER_SITE_OTHER): Payer: 59 | Admitting: Physician Assistant

## 2018-06-04 ENCOUNTER — Encounter (INDEPENDENT_AMBULATORY_CARE_PROVIDER_SITE_OTHER): Payer: Self-pay

## 2018-06-08 ENCOUNTER — Ambulatory Visit (INDEPENDENT_AMBULATORY_CARE_PROVIDER_SITE_OTHER): Payer: 59 | Admitting: Bariatrics

## 2018-06-08 ENCOUNTER — Encounter (INDEPENDENT_AMBULATORY_CARE_PROVIDER_SITE_OTHER): Payer: Self-pay | Admitting: Bariatrics

## 2018-06-08 VITALS — BP 148/85 | HR 80 | Temp 97.7°F | Ht 67.0 in | Wt 233.0 lb

## 2018-06-08 DIAGNOSIS — E559 Vitamin D deficiency, unspecified: Secondary | ICD-10-CM

## 2018-06-08 DIAGNOSIS — Z6836 Body mass index (BMI) 36.0-36.9, adult: Secondary | ICD-10-CM

## 2018-06-08 DIAGNOSIS — Z9189 Other specified personal risk factors, not elsewhere classified: Secondary | ICD-10-CM

## 2018-06-08 DIAGNOSIS — R7303 Prediabetes: Secondary | ICD-10-CM

## 2018-06-08 DIAGNOSIS — F3289 Other specified depressive episodes: Secondary | ICD-10-CM | POA: Diagnosis not present

## 2018-06-08 MED ORDER — VITAMIN D (ERGOCALCIFEROL) 1.25 MG (50000 UNIT) PO CAPS: 50000.0000 [IU] | ORAL_CAPSULE | ORAL | 0 refills | Status: DC

## 2018-06-08 MED ORDER — METFORMIN HCL 500 MG PO TABS: 500.0000 mg | ORAL_TABLET | Freq: Every day | ORAL | 0 refills | Status: DC

## 2018-06-08 MED FILL — VIT D2 1.25 MG (50,000 UNIT: 1.25 MG | 28 days supply | Qty: 4 | Fill #0

## 2018-06-08 MED FILL — metFORMIN HCL 500 MG TABS: 500 | 30 days supply | Qty: 30 | Fill #0

## 2018-06-09 NOTE — Progress Notes (Signed)
Office: [phone]  /  Fax: [phone]   HPI:   Chief Complaint: OBESITY Candace [name] is here to discuss her progress with her obesity treatment plan. She is on the Category 4 plan and is following her eating plan approximately 60 % of the time. She states she is doing cardio, walking and exercising with a personal trainer 20 minutes 3 times per week. [name] struggled some over the holidays with increased stress and disruptions to her schedule. [name] is less hungry. Her weight is 233 lb (105.7 kg) today and has had a weight loss of 1 pound over a period of 3 to 4 weeks since her last visit. She has lost 16 lbs since starting treatment with [name].  Pre-Diabetes [name] has a diagnosis of prediabetes based on her elevated Hgb A1c and was informed this puts her at greater risk of developing diabetes. She is taking metformin currently and continues to work on diet and exercise to decrease risk of diabetes. She denies  hypoglycemia.  At risk for diabetes [name] is at higher than average risk for developing diabetes due to her obesity and prediabetes. She currently denies polyuria or polydipsia.  Vitamin D deficiency [name] has a diagnosis of vitamin D deficiency. She is currently taking vit D and denies nausea, vomiting or muscle weakness.  Depression with emotional eating behaviors [name] struggles with emotional eating and using food for comfort to the extent that it is negatively impacting her health. She often snacks when she is not hungry. [name] sometimes feels she is out of control and then feels guilty that she made poor food choices. She has been working on behavior modification techniques to help reduce her emotional eating and has been somewhat successful. [name] is taking Bupropion per another provider and was decreased from 300 mg to 150 mg. She shows no sign of suicidal or homicidal ideations.  Depression screen [name] 2/9 09/17/2017 10/08/2016  Decreased Interest 3 0  Down, Depressed,  Hopeless 3 1  PHQ - 2 Score 6 1  Altered sleeping 2 -  Tired, decreased energy 3 -  Change in appetite 3 -  Feeling bad or failure about yourself  3 -  Trouble concentrating 1 -  Moving slowly or fidgety/restless 0 -  Suicidal thoughts 0 -  PHQ-9 Score 18 -  Difficult doing work/chores Very difficult -     ALLERGIES: No Known Allergies  MEDICATIONS: Current Outpatient Medications on File Prior to Visit  Medication Sig Dispense Refill  . anastrozole (ARIMIDEX) 1 MG tablet TAKE 1 TABLET (1 MG TOTAL) BY MOUTH DAILY. 90 tablet 4  . buPROPion (WELLBUTRIN XL) 150 MG 24 hr tablet Take 150 mg by mouth daily.    . cetirizine (ZYRTEC) 10 MG tablet Take 10 mg by mouth daily. [facility]    . esomeprazole (NEXIUM) 40 MG capsule Take 40 mg by mouth every 36 (thirty-six) hours.    [name] FLUoxetine (PROZAC) 10 MG capsule Take 10 mg by mouth daily with lunch.     . Multiple Vitamin (MULTIVITAMIN WITH MINERALS) TABS tablet Take 1 tablet by mouth daily.    [name] rOPINIRole (REQUIP) 0.25 MG tablet Take 0.5 mg by mouth daily at 8 pm.    . rosuvastatin (CRESTOR) 10 MG tablet Take 10 mg by mouth at bedtime.    [name] buPROPion (WELLBUTRIN XL) 300 MG 24 hr tablet Take 300 mg by mouth daily.      No current facility-administered medications on file prior to visit.  PAST MEDICAL HISTORY: Past Medical History:  Diagnosis Date  . Abnormal Pap smear   . Alcohol abuse   . Anovulation   . Anxiety   . Breast cancer ([place]) 04/05/2016   right breast  . Cancer ([place]) 2017   right breast cancer  . Complication of anesthesia    slow to wake up  . Depression   . Endometrial hyperplasia   . Endometrial polyp    h/o  . Epidermal cyst    h/o  . Gallbladder problem   . GERD (gastroesophageal reflux disease)   . H/O hemorrhoids   . H/O menorrhagia   . High risk HPV infection   . Hyperlipidemia   . Insomnia   . Obesity   . Oligomenorrhea   . Prediabetes   . Restless legs syndrome   .  Seasonal allergies   . Vitamin D deficiency     PAST SURGICAL HISTORY: Past Surgical History:  Procedure Laterality Date  . BREAST LUMPECTOMY Right 05/2016   radiation  . BREAST LUMPECTOMY WITH RADIOACTIVE SEED AND SENTINEL LYMPH NODE BIOPSY Right 05/28/2016   Procedure: RADIOACTIVE SEED GUIDED RIGHT BREAST LUMPECTOMY WITH  RIGHT AXILLARY SENTINEL LYMPH NODE BIOPSY;  Surgeon: [name], MD;  Location: [place];  Service: General;  Laterality: Right;  . CARPAL TUNNEL RELEASE  03/26/2011   right hand  . CARPAL TUNNEL RELEASE  05/28/2011   Procedure: CARPAL TUNNEL RELEASE;  Surgeon: [name];  Location: [facility];  Service: Orthopedics;  Laterality: Left;  . CHOLECYSTECTOMY N/A 01/24/2013   Procedure: LAPAROSCOPIC CHOLECYSTECTOMY WITH INTRAOPERATIVE CHOLANGIOGRAM;  Surgeon: [name], MD;  Location: [place];  Service: General;  Laterality: N/A;  . COLONOSCOPY    . HYSTEROSCOPY    . POLYPECTOMY    . TONSILLECTOMY  1967   as child  . trigger fingers  8 yrs. ago   both done months apart  . uterine ablation  06/04/2010  . uterine ablation      SOCIAL HISTORY: Social History   Tobacco Use  . Smoking status: Never Smoker  . Smokeless tobacco: Never Used  Substance Use Topics  . Alcohol use: No    Comment: none since 1996  . Drug use: No    FAMILY HISTORY: Family History  Problem Relation Age of Onset  . Heart disease Mother   . Hypertension Mother   . Stroke Mother   . Dementia Mother        vascular dementia  . Hyperlipidemia Mother   . Depression Mother   . Anxiety disorder Mother   . Alcohol abuse Mother   . Obesity Mother   . Pulmonary fibrosis Father        d. 58  . Depression Father   . Anxiety disorder Father   . Alcohol abuse Father   . Heart attack Maternal Grandmother        d. [phone]  . Heart disease Maternal Grandmother   . Heart disease Maternal Grandfather        d. 60-63  . Leukemia Paternal Grandmother        d. late 50s  .  Breast cancer Sister 29       IDC s/p mastectomy and chemo    ROS: Review of Systems  Constitutional: Negative for weight loss.  Gastrointestinal: Negative for nausea and vomiting.  Genitourinary: Negative for frequency.  Musculoskeletal:       Negative for muscle weakness  Endo/Heme/Allergies: Negative for polydipsia.  Negative for hypoglycemia  Psychiatric/Behavioral: Positive for depression. Negative for suicidal ideas.    PHYSICAL EXAM: Blood pressure (!) 148/85, pulse 80, temperature 97.7 F (36.5 C), temperature source Oral, height 5\' 7"  (1.702 m), weight 233 lb (105.7 kg), SpO2 95 %. Body mass index is 36.49 kg/m. Physical Exam  Constitutional: She is oriented to person, place, and time. She appears well-developed and well-nourished.  Cardiovascular: Normal rate.  Pulmonary/Chest: Effort normal.  Musculoskeletal: Normal range of motion.  Neurological: She is oriented to person, place, and time.  Skin: Skin is warm and dry.  Psychiatric: She has a normal mood and affect. Her behavior is normal. She expresses no homicidal and no suicidal ideation.  Vitals reviewed.   RECENT LABS AND TESTS: BMET    Component Value Date/Time   NA 139 02/18/2018 1011   NA 141 04/16/2017 1409   K 4.1 02/18/2018 1011   K 3.8 04/16/2017 1409   CL 100 02/18/2018 1011   CO2 26 02/18/2018 1011   CO2 25 04/16/2017 1409   GLUCOSE 105 (H) 02/18/2018 1011   GLUCOSE 79 11/11/2017 1127   GLUCOSE 134 04/16/2017 1409   BUN 15 02/18/2018 1011   BUN 12.4 04/16/2017 1409   CREATININE 0.77 02/18/2018 1011   CREATININE 0.9 04/16/2017 1409   CALCIUM 9.9 02/18/2018 1011   CALCIUM 9.6 04/16/2017 1409   GFRNONAA 87 02/18/2018 1011   GFRAA 100 02/18/2018 1011   Lab Results  Component Value Date   HGBA1C 5.9 (H) 02/18/2018   HGBA1C 6.2 (H) 09/17/2017   Lab Results  Component Value Date   INSULIN 15.2 02/18/2018   INSULIN 24.2 09/17/2017   CBC    Component Value Date/Time   WBC 6.8  11/11/2017 1127   RBC 5.02 11/11/2017 1127   HGB 14.2 11/11/2017 1127   HGB 13.7 04/16/2017 1409   HCT 42.2 11/11/2017 1127   HCT 41.2 04/16/2017 1409   PLT 248 11/11/2017 1127   PLT 259 04/16/2017 1409   MCV 84.1 11/11/2017 1127   MCV 84.4 04/16/2017 1409   MCH 28.2 11/11/2017 1127   MCHC 33.5 11/11/2017 1127   RDW 13.1 11/11/2017 1127   RDW 13.2 04/16/2017 1409   LYMPHSABS 1.7 11/11/2017 1127   LYMPHSABS 1.9 04/16/2017 1409   MONOABS 0.8 11/11/2017 1127   MONOABS 0.6 04/16/2017 1409   EOSABS 0.3 11/11/2017 1127   EOSABS 0.1 04/16/2017 1409   BASOSABS 0.0 11/11/2017 1127   BASOSABS 0.0 04/16/2017 1409   Iron/TIBC/Ferritin/ %Sat No results found for: IRON, TIBC, FERRITIN, IRONPCTSAT Lipid Panel     Component Value Date/Time   CHOL 181 02/18/2018 1011   TRIG 222 (H) 02/18/2018 1011   HDL 49 02/18/2018 1011   LDLCALC 88 02/18/2018 1011   Hepatic Function Panel     Component Value Date/Time   PROT 7.2 02/18/2018 1011   PROT 7.0 04/16/2017 1409   ALBUMIN 4.5 02/18/2018 1011   ALBUMIN 4.0 04/16/2017 1409   AST 15 02/18/2018 1011   AST 29 04/16/2017 1409   ALT 20 02/18/2018 1011   ALT 34 04/16/2017 1409   ALKPHOS 94 02/18/2018 1011   ALKPHOS 74 04/16/2017 1409   BILITOT 0.3 02/18/2018 1011   BILITOT 0.40 04/16/2017 1409      Component Value Date/Time   TSH 2.390 09/17/2017 1140    Ref. Range 02/18/2018 10:11  Vitamin D, 25-Hydroxy Latest Ref Range: 30.0 - 100.0 ng/mL 43.2   ASSESSMENT AND PLAN: Prediabetes - Plan: metFORMIN (GLUCOPHAGE) 500 MG tablet  Vitamin D deficiency - Plan: Vitamin D, Ergocalciferol, (DRISDOL) 1.25 MG (50000 UT) CAPS capsule  Other depression - with emotional eating  At risk for diabetes mellitus  Class 2 severe obesity with serious comorbidity and body mass index (BMI) of 36.0 to 36.9 in adult, unspecified obesity type [facility])  PLAN:  Pre-Diabetes Abigael will continue to work on weight loss, exercise, and decreasing simple  carbohydrates in her diet to help decrease the risk of diabetes. We dicussed metformin including benefits and risks. She was informed that eating too many simple carbohydrates or too many calories at one sitting increases the likelihood of GI side effects. Odesser agreed to continue metformin 500 mg 1 tablet with breakfast #30 with no refills and follow up with [name] as directed to monitor her progress.  Diabetes risk counseling Aritha was given extended (15 minutes) diabetes prevention counseling today. She is 57 y.o. female and has risk factors for diabetes including obesity and prediabetes. We discussed intensive lifestyle modifications today with an emphasis on weight loss as well as increasing exercise and decreasing simple carbohydrates in her diet.  Vitamin D Deficiency Marleigh was informed that low vitamin D levels contributes to fatigue and are associated with obesity, breast, and colon cancer. She agrees to continue to take prescription Vit D @50 ,000 IU every week #4 with no refills and will follow up for routine testing of vitamin D, at least 2-3 times per year. She was informed of the risk of over-replacement of vitamin D and agrees to not increase her dose unless she discusses this with [name] first. Mikia agrees to follow up as directed.  Depression with Emotional Eating Behaviors We discussed behavior modification techniques today to help Nyomie deal with her emotional eating and depression. She will continue Bupropion (Wellbutrin SR) 150 mg qd and follow up as directed.  Obesity Ivionna is currently in the action stage of change. As such, her goal is to continue with weight loss efforts She will stick with her Category 4 plan Navika has been instructed to work up to a goal of 150 minutes of combined cardio and strengthening exercise per week for weight loss and overall health benefits. We discussed the following Behavioral Modification Strategies today: increase H2O intake, keeping healthy  foods in the home, better snacking choices, increasing lean protein intake, decreasing simple carbohydrates, increasing vegetables and work on meal planning and easy cooking plans  Dorothye has agreed to follow up with our clinic in 2 weeks fasting. She was informed of the importance of frequent follow up visits to maximize her success with intensive lifestyle modifications for her multiple health conditions.   OBESITY BEHAVIORAL INTERVENTION VISIT  Today's visit was # 15   Starting weight: 249 lbs Starting date: 09/17/2017 Today's weight : 233 lbs Today's date: 06/08/2018 Total lbs lost to date: 71   ASK: We discussed the diagnosis of obesity with Leland Johns today and Clelia agreed to give [name] permission to discuss obesity behavioral modification therapy today.  ASSESS: Alexa has the diagnosis of obesity and her BMI today is 36.48 Amahia is in the action stage of change   ADVISE: Tkeyah was educated on the multiple health risks of obesity as well as the benefit of weight loss to improve her health. She was advised of the need for long term treatment and the importance of lifestyle modifications to improve her current health and to decrease her risk of future health problems.  AGREE: Multiple dietary modification options and treatment options were discussed and  Hebe agreed to follow the recommendations documented in the above note.  ARRANGE: Alletta was educated on the importance of frequent visits to treat obesity as outlined per CMS and USPSTF guidelines and agreed to schedule her next follow up appointment today.  Corey Skains, am acting as Location manager for [employer]. Owens Shark, DO  I have reviewed the above documentation for accuracy and completeness, and I agree with the above. -Jearld Lesch, DO

## 2018-06-11 MED FILL — ANASTROZOLE 1 MG TABLET: 1 | 90 days supply | Qty: 90 | Fill #2

## 2018-06-18 MED FILL — ROSUVASTATIN CALCIUM 10 MG: 10 | 90 days supply | Qty: 90 | Fill #2

## 2018-07-02 ENCOUNTER — Ambulatory Visit (INDEPENDENT_AMBULATORY_CARE_PROVIDER_SITE_OTHER): Payer: 59 | Admitting: Physician Assistant

## 2018-07-02 ENCOUNTER — Encounter (INDEPENDENT_AMBULATORY_CARE_PROVIDER_SITE_OTHER): Payer: Self-pay | Admitting: Physician Assistant

## 2018-07-02 VITALS — BP 144/87 | HR 83 | Temp 98.1°F | Ht 67.0 in | Wt 239.0 lb

## 2018-07-02 DIAGNOSIS — E559 Vitamin D deficiency, unspecified: Secondary | ICD-10-CM

## 2018-07-02 DIAGNOSIS — Z6837 Body mass index (BMI) 37.0-37.9, adult: Secondary | ICD-10-CM

## 2018-07-02 DIAGNOSIS — R7303 Prediabetes: Secondary | ICD-10-CM

## 2018-07-02 DIAGNOSIS — Z9189 Other specified personal risk factors, not elsewhere classified: Secondary | ICD-10-CM | POA: Diagnosis not present

## 2018-07-02 MED ORDER — VITAMIN D (ERGOCALCIFEROL) 1.25 MG (50000 UNIT) PO CAPS: 50000.0000 [IU] | ORAL_CAPSULE | ORAL | 0 refills | Status: DC

## 2018-07-02 MED ORDER — METFORMIN HCL 500 MG PO TABS: 500.0000 mg | ORAL_TABLET | Freq: Every day | ORAL | 0 refills | Status: DC

## 2018-07-02 MED FILL — metFORMIN HCL 500 MG TABS: 500 | 30 days supply | Qty: 30 | Fill #0

## 2018-07-02 MED FILL — VIT D2 1.25 MG (50,000 UNIT: 1.25 MG | 28 days supply | Qty: 4 | Fill #0

## 2018-07-02 NOTE — Progress Notes (Signed)
Office: [phone]  /  Fax: [phone]   HPI:   Chief Complaint: OBESITY Candace [name] is here to discuss her progress with her obesity treatment plan. She is on the Category 4 plan and is following her eating plan approximately 25 % of the time. She states she is walking and doing strengthening exercise for 30 minutes 3 times per week. Candace [name] struggled to follow the plan over the holidays. She is not feeling well due to eating off the plan. She is ready to get back on track.  Her weight is 239 lb (108.4 kg) today and has gained 6 pounds since her last visit. She has lost 10 lbs since starting treatment with [name].  Pre-Diabetes Candace [name] has a diagnosis of pre-diabetes based on her elevated Hgb A1c and was informed this puts her at greater risk of developing diabetes. She denies nausea, vomiting, or diarrhea on metformin. She continues to work on diet and exercise to decrease risk of diabetes. She denies polyphagia or hypoglycemia.  At risk for diabetes Candace [name] is at higher than average risk for developing diabetes due to her obesity and pre-diabetes. She currently denies polyuria or polydipsia.  Vitamin D Deficiency Candace [name] has a diagnosis of vitamin D deficiency. She is currently taking prescription Vit D and denies nausea, vomiting or muscle weakness.  ALLERGIES: No Known Allergies  MEDICATIONS: Current Outpatient Medications on File Prior to Visit  Medication Sig Dispense Refill  . anastrozole (ARIMIDEX) 1 MG tablet TAKE 1 TABLET (1 MG TOTAL) BY MOUTH DAILY. 90 tablet 4  . buPROPion (WELLBUTRIN XL) 150 MG 24 hr tablet Take 150 mg by mouth daily.    [name] buPROPion (WELLBUTRIN XL) 300 MG 24 hr tablet Take 300 mg by mouth daily.     . cetirizine (ZYRTEC) 10 MG tablet Take 10 mg by mouth daily. [facility]    . esomeprazole (NEXIUM) 40 MG capsule Take 40 mg by mouth every 36 (thirty-six) hours.    [name] FLUoxetine (PROZAC) 10 MG capsule Take 10 mg by mouth daily with lunch.       . Multiple Vitamin (MULTIVITAMIN WITH MINERALS) TABS tablet Take 1 tablet by mouth daily.    [name] rOPINIRole (REQUIP) 0.25 MG tablet Take 0.5 mg by mouth daily at 8 pm.    . rosuvastatin (CRESTOR) 10 MG tablet Take 10 mg by mouth at bedtime.     No current facility-administered medications on file prior to visit.     PAST MEDICAL HISTORY: Past Medical History:  Diagnosis Date  . Abnormal Pap smear   . Alcohol abuse   . Anovulation   . Anxiety   . Breast cancer ([place]) 04/05/2016   right breast  . Cancer ([place]) 2017   right breast cancer  . Complication of anesthesia    slow to wake up  . Depression   . Endometrial hyperplasia   . Endometrial polyp    h/o  . Epidermal cyst    h/o  . Gallbladder problem   . GERD (gastroesophageal reflux disease)   . H/O hemorrhoids   . H/O menorrhagia   . High risk HPV infection   . Hyperlipidemia   . Insomnia   . Obesity   . Oligomenorrhea   . Prediabetes   . Restless legs syndrome   . Seasonal allergies   . Vitamin D deficiency     PAST SURGICAL HISTORY: Past Surgical History:  Procedure Laterality Date  . BREAST LUMPECTOMY Right 05/2016   radiation  . BREAST LUMPECTOMY WITH  RADIOACTIVE SEED AND SENTINEL LYMPH NODE BIOPSY Right 05/28/2016   Procedure: RADIOACTIVE SEED GUIDED RIGHT BREAST LUMPECTOMY WITH  RIGHT AXILLARY SENTINEL LYMPH NODE BIOPSY;  Surgeon: [name], MD;  Location: [facility];  Service: General;  Laterality: Right;  . CARPAL TUNNEL RELEASE  03/26/2011   right hand  . CARPAL TUNNEL RELEASE  05/28/2011   Procedure: CARPAL TUNNEL RELEASE;  Surgeon: [name];  Location: [facility];  Service: Orthopedics;  Laterality: Left;  . CHOLECYSTECTOMY N/A 01/24/2013   Procedure: LAPAROSCOPIC CHOLECYSTECTOMY WITH INTRAOPERATIVE CHOLANGIOGRAM;  Surgeon: [name], MD;  Location: [facility];  Service: General;  Laterality: N/A;  . COLONOSCOPY    . HYSTEROSCOPY    . POLYPECTOMY    . TONSILLECTOMY  1967   as child  .  trigger fingers  8 yrs. ago   both done months apart  . uterine ablation  06/04/2010  . uterine ablation      SOCIAL HISTORY: Social History   Tobacco Use  . Smoking status: Never Smoker  . Smokeless tobacco: Never Used  Substance Use Topics  . Alcohol use: No    Comment: none since 1996  . Drug use: No    FAMILY HISTORY: Family History  Problem Relation Age of Onset  . Heart disease Mother   . Hypertension Mother   . Stroke Mother   . Dementia Mother        vascular dementia  . Hyperlipidemia Mother   . Depression Mother   . Anxiety disorder Mother   . Alcohol abuse Mother   . Obesity Mother   . Pulmonary fibrosis Father        d. 2  . Depression Father   . Anxiety disorder Father   . Alcohol abuse Father   . Heart attack Maternal Grandmother        d. [phone]  . Heart disease Maternal Grandmother   . Heart disease Maternal Grandfather        d. 60-63  . Leukemia Paternal Grandmother        d. late 48s  . Breast cancer Sister 6       IDC s/p mastectomy and chemo    ROS: Review of Systems  Constitutional: Negative for weight loss.  Gastrointestinal: Negative for diarrhea, nausea and vomiting.  Genitourinary: Negative for frequency.  Musculoskeletal:       Negative muscle weakness  Endo/Heme/Allergies: Negative for polydipsia.       Negative polyphagia Negative hypoglycemia    PHYSICAL EXAM: Blood pressure (!) 144/87, pulse 83, temperature 98.1 F (36.7 C), temperature source Oral, height 5\' 7"  (1.702 m), weight 239 lb (108.4 kg), SpO2 96 %. Body mass index is 37.43 kg/m. Physical Exam Vitals signs reviewed.  Constitutional:      Appearance: Normal appearance. She is obese.  Cardiovascular:     Rate and Rhythm: Normal rate.     Pulses: Normal pulses.  Pulmonary:     Effort: Pulmonary effort is normal.  Musculoskeletal: Normal range of motion.  Skin:    General: Skin is warm and dry.  Neurological:     Mental Status: She is alert and  oriented to person, place, and time.  Psychiatric:        Mood and Affect: Mood normal.        Behavior: Behavior normal.     RECENT LABS AND TESTS: BMET    Component Value Date/Time   NA 139 02/18/2018 1011   NA 141 04/16/2017 1409   K  4.1 02/18/2018 1011   K 3.8 04/16/2017 1409   CL 100 02/18/2018 1011   CO2 26 02/18/2018 1011   CO2 25 04/16/2017 1409   GLUCOSE 105 (H) 02/18/2018 1011   GLUCOSE 79 11/11/2017 1127   GLUCOSE 134 04/16/2017 1409   BUN 15 02/18/2018 1011   BUN 12.4 04/16/2017 1409   CREATININE 0.77 02/18/2018 1011   CREATININE 0.9 04/16/2017 1409   CALCIUM 9.9 02/18/2018 1011   CALCIUM 9.6 04/16/2017 1409   GFRNONAA 87 02/18/2018 1011   GFRAA 100 02/18/2018 1011   Lab Results  Component Value Date   HGBA1C 5.9 (H) 02/18/2018   HGBA1C 6.2 (H) 09/17/2017   Lab Results  Component Value Date   INSULIN 15.2 02/18/2018   INSULIN 24.2 09/17/2017   CBC    Component Value Date/Time   WBC 6.8 11/11/2017 1127   RBC 5.02 11/11/2017 1127   HGB 14.2 11/11/2017 1127   HGB 13.7 04/16/2017 1409   HCT 42.2 11/11/2017 1127   HCT 41.2 04/16/2017 1409   PLT 248 11/11/2017 1127   PLT 259 04/16/2017 1409   MCV 84.1 11/11/2017 1127   MCV 84.4 04/16/2017 1409   MCH 28.2 11/11/2017 1127   MCHC 33.5 11/11/2017 1127   RDW 13.1 11/11/2017 1127   RDW 13.2 04/16/2017 1409   LYMPHSABS 1.7 11/11/2017 1127   LYMPHSABS 1.9 04/16/2017 1409   MONOABS 0.8 11/11/2017 1127   MONOABS 0.6 04/16/2017 1409   EOSABS 0.3 11/11/2017 1127   EOSABS 0.1 04/16/2017 1409   BASOSABS 0.0 11/11/2017 1127   BASOSABS 0.0 04/16/2017 1409   Iron/TIBC/Ferritin/ %Sat No results found for: IRON, TIBC, FERRITIN, IRONPCTSAT Lipid Panel     Component Value Date/Time   CHOL 181 02/18/2018 1011   TRIG 222 (H) 02/18/2018 1011   HDL 49 02/18/2018 1011   LDLCALC 88 02/18/2018 1011   Hepatic Function Panel     Component Value Date/Time   PROT 7.2 02/18/2018 1011   PROT 7.0 04/16/2017 1409    ALBUMIN 4.5 02/18/2018 1011   ALBUMIN 4.0 04/16/2017 1409   AST 15 02/18/2018 1011   AST 29 04/16/2017 1409   ALT 20 02/18/2018 1011   ALT 34 04/16/2017 1409   ALKPHOS 94 02/18/2018 1011   ALKPHOS 74 04/16/2017 1409   BILITOT 0.3 02/18/2018 1011   BILITOT 0.40 04/16/2017 1409      Component Value Date/Time   TSH 2.390 09/17/2017 1140    ASSESSMENT AND PLAN: Prediabetes - Plan: metFORMIN (GLUCOPHAGE) 500 MG tablet  Vitamin D deficiency - Plan: Vitamin D, Ergocalciferol, (DRISDOL) 1.25 MG (50000 UT) CAPS capsule  At risk for diabetes mellitus  Class 2 severe obesity with serious comorbidity and body mass index (BMI) of 37.0 to 37.9 in adult, unspecified obesity type ([place])  PLAN:  Pre-Diabetes Candace [name] will continue to work on weight loss, exercise, and decreasing simple carbohydrates in her diet to help decrease the risk of diabetes. We dicussed metformin including benefits and risks. She was informed that eating too many simple carbohydrates or too many calories at one sitting increases the likelihood of GI side effects. Zadaya agrees to continue taking metformin 500 mg q AM #30 and we will refill for 1 month. Ubah agrees to follow up with our clinic in 2 weeks as directed to monitor her progress.  Diabetes risk counselling Candace [name] was given extended (15 minutes) diabetes prevention counseling today. She is 58 y.o. female and has risk factors for diabetes including obesity and pre-diabetes. We discussed  intensive lifestyle modifications today with an emphasis on weight loss as well as increasing exercise and decreasing simple carbohydrates in her diet.  Vitamin D Deficiency Candace [name] was informed that low vitamin D levels contributes to fatigue and are associated with obesity, breast, and colon cancer. Candace [name] agrees to continue taking prescription Vit D @50 ,000 IU every week #4 and we will refill for 1 month. She will follow up for routine testing of vitamin D, at least 2-3 times per  year. She was informed of the risk of over-replacement of vitamin D and agrees to not increase her dose unless she discusses this with [name] first. Candace [name] agrees to follow up with our clinic in 2 weeks.  Obesity Candace [name] is currently in the action stage of change. As such, her goal is to continue with weight loss efforts She has agreed to follow the Category 4 plan Candace [name] has been instructed to work up to a goal of 150 minutes of combined cardio and strengthening exercise per week for weight loss and overall health benefits. We discussed the following Behavioral Modification Strategies today: work on meal planning and easy cooking plans and planning for success   Candace [name] has agreed to follow up with our clinic in 2 weeks. She was informed of the importance of frequent follow up visits to maximize her success with intensive lifestyle modifications for her multiple health conditions.   OBESITY BEHAVIORAL INTERVENTION VISIT  Today's visit was # 16   Starting weight: 249 lbs Starting date: 09/17/17 Today's weight : 239 lbs Today's date: 07/02/2018 Total lbs lost to date: 10    ASK: We discussed the diagnosis of obesity with Candace [name] today and Candace [name] agreed to give [name] permission to discuss obesity behavioral modification therapy today.  ASSESS: Candace [name] has the diagnosis of obesity and her BMI today is 37.42 Candace [name] is in the action stage of change   ADVISE: Candace [name] was educated on the multiple health risks of obesity as well as the benefit of weight loss to improve her health. She was advised of the need for long term treatment and the importance of lifestyle modifications.  AGREE: Multiple dietary modification options and treatment options were discussed and  Candace [name] agreed to the above obesity treatment plan.  Candace [name] Durie, am acting as transcriptionist for [name], PA-C I, [name], PA-C have reviewed above note and agree with its content

## 2018-07-08 MED FILL — rOPINIRole HCL 0.5 MG TABS: 0.5 | 90 days supply | Qty: 90 | Fill #1

## 2018-07-15 ENCOUNTER — Encounter (INDEPENDENT_AMBULATORY_CARE_PROVIDER_SITE_OTHER): Payer: Self-pay | Admitting: Physician Assistant

## 2018-07-15 ENCOUNTER — Ambulatory Visit (INDEPENDENT_AMBULATORY_CARE_PROVIDER_SITE_OTHER): Payer: 59 | Admitting: Physician Assistant

## 2018-07-15 VITALS — BP 145/8 | HR 95 | Temp 97.5°F | Ht 67.0 in | Wt 239.0 lb

## 2018-07-15 DIAGNOSIS — R7303 Prediabetes: Secondary | ICD-10-CM | POA: Diagnosis not present

## 2018-07-15 DIAGNOSIS — Z9189 Other specified personal risk factors, not elsewhere classified: Secondary | ICD-10-CM | POA: Diagnosis not present

## 2018-07-15 DIAGNOSIS — Z6837 Body mass index (BMI) 37.0-37.9, adult: Secondary | ICD-10-CM

## 2018-07-15 DIAGNOSIS — E559 Vitamin D deficiency, unspecified: Secondary | ICD-10-CM

## 2018-07-15 DIAGNOSIS — E66812 Obesity, class 2: Secondary | ICD-10-CM

## 2018-07-15 MED ORDER — VITAMIN D (ERGOCALCIFEROL) 1.25 MG (50000 UNIT) PO CAPS: 50000.0000 [IU] | ORAL_CAPSULE | ORAL | 0 refills | Status: DC

## 2018-07-15 MED ORDER — METFORMIN HCL 500 MG PO TABS: 500.0000 mg | ORAL_TABLET | Freq: Every day | ORAL | 0 refills | Status: DC

## 2018-07-15 NOTE — Progress Notes (Signed)
Office: [phone]  /  Fax: [phone]   HPI:   Chief Complaint: OBESITY Candace [name] is here to discuss her progress with her obesity treatment plan. She is on the Category 4 plan and is following her eating plan approximately 50 % of the time. She states she is exercising at the gym for 60 minutes 3 times per week. Candace [name] reports struggling with the plan. She is trying to get back on track with her eating. She has also started a new workout program. Her weight is 239 lb (108.4 kg) today and has lost 0 lbs since her last visit. She has lost 10 lbs since starting treatment with [name].  Pre-Diabetes Candace [name] has a diagnosis of prediabetes based on her elevated Hgb A1c and was informed this puts her at greater risk of developing diabetes. She is taking metformin currently and continues to work on diet and exercise to decrease risk of diabetes. She denies nausea or hypoglycemia. She denies polyphagia.  Vitamin D deficiency Candace [name] has a diagnosis of vitamin D deficiency. She is currently taking prescription Vit D and denies nausea, vomiting or muscle weakness.  At risk for diabetes Candace [name] is at higher than average risk for developing diabetes due to her obesity. She currently denies polyuria or polydipsia.   ASSESSMENT AND PLAN:  Prediabetes - Plan: metFORMIN (GLUCOPHAGE) 500 MG tablet  Vitamin D deficiency - Plan: Vitamin D, Ergocalciferol, (DRISDOL) 1.25 MG (50000 UT) CAPS capsule  At risk for diabetes mellitus  Class 2 severe obesity with serious comorbidity and body mass index (BMI) of 37.0 to 37.9 in adult, unspecified obesity type [facility])  PLAN:   Pre-Diabetes Candace [name] will continue to work on weight loss, exercise, and decreasing simple carbohydrates in her diet to help decrease the risk of diabetes. We dicussed metformin including benefits and risks. She was informed that eating too many simple carbohydrates or too many calories at one sitting increases the likelihood of GI side  effects. Elle agrees to continue taking metformin 500 mg qd with breakfast with no refills for now and a prescription was written today. [name] agrees to follow up with our clinic in 2 weeks.  Vitamin D Deficiency Candace [name] was informed that low vitamin D levels contributes to fatigue and are associated with obesity, breast, and colon cancer. She agrees to continue to take prescription Vit D @50 ,000 IU every week #4 with no refills and will follow up for routine testing of vitamin D, at least 2-3 times per year. She was informed of the risk of over-replacement of vitamin D and agrees to not increase her dose unless she discusses this with [name] first.  Candace [name] agrees to follow up with our clinic in 2 weeks.   Diabetes risk counseling Candace [name] was given extended (15 minutes) diabetes prevention counseling today. She is 58 y.o. female and has risk factors for diabetes including obesity. We discussed intensive lifestyle modifications today with an emphasis on weight loss as well as increasing exercise and decreasing simple carbohydrates in her diet.  Obesity Candace [name] is currently in the action stage of change. As such, her goal is to continue with weight loss efforts She has agreed to follow the Category 4 plan Candace [name] has been instructed to work up to a goal of 150 minutes of combined cardio and strengthening exercise per week for weight loss and overall health benefits. We discussed the following Behavioral Modification Strategies today: work on meal planning and easy cooking plans and keeping healthy foods in the home  Candace [name] has agreed  to follow up with our clinic in 2 weeks. She was informed of the importance of frequent follow up visits to maximize her success with intensive lifestyle modifications for her multiple health conditions.  ALLERGIES: No Known Allergies  MEDICATIONS: Current Outpatient Medications on File Prior to Visit  Medication Sig Dispense Refill  . anastrozole (ARIMIDEX) 1 MG tablet  TAKE 1 TABLET (1 MG TOTAL) BY MOUTH DAILY. 90 tablet 4  . buPROPion (WELLBUTRIN XL) 150 MG 24 hr tablet Take 150 mg by mouth daily.    [name] buPROPion (WELLBUTRIN XL) 300 MG 24 hr tablet Take 300 mg by mouth daily.     . cetirizine (ZYRTEC) 10 MG tablet Take 10 mg by mouth daily. [facility]    . esomeprazole (NEXIUM) 40 MG capsule Take 40 mg by mouth every 36 (thirty-six) hours.    [name] FLUoxetine (PROZAC) 10 MG capsule Take 10 mg by mouth daily with lunch.     . Multiple Vitamin (MULTIVITAMIN WITH MINERALS) TABS tablet Take 1 tablet by mouth daily.    [name] rOPINIRole (REQUIP) 0.25 MG tablet Take 0.5 mg by mouth daily at 8 pm.    . rosuvastatin (CRESTOR) 10 MG tablet Take 10 mg by mouth at bedtime.     No current facility-administered medications on file prior to visit.     PAST MEDICAL HISTORY: Past Medical History:  Diagnosis Date  . Abnormal Pap smear   . Alcohol abuse   . Anovulation   . Anxiety   . Breast cancer ([place]) 04/05/2016   right breast  . Cancer ([place]) 2017   right breast cancer  . Complication of anesthesia    slow to wake up  . Depression   . Endometrial hyperplasia   . Endometrial polyp    h/o  . Epidermal cyst    h/o  . Gallbladder problem   . GERD (gastroesophageal reflux disease)   . H/O hemorrhoids   . H/O menorrhagia   . High risk HPV infection   . Hyperlipidemia   . Insomnia   . Obesity   . Oligomenorrhea   . Prediabetes   . Restless legs syndrome   . Seasonal allergies   . Vitamin D deficiency     PAST SURGICAL HISTORY: Past Surgical History:  Procedure Laterality Date  . BREAST LUMPECTOMY Right 05/2016   radiation  . BREAST LUMPECTOMY WITH RADIOACTIVE SEED AND SENTINEL LYMPH NODE BIOPSY Right 05/28/2016   Procedure: RADIOACTIVE SEED GUIDED RIGHT BREAST LUMPECTOMY WITH  RIGHT AXILLARY SENTINEL LYMPH NODE BIOPSY;  Surgeon: [name], MD;  Location: [facility];  Service: General;  Laterality: Right;  . CARPAL TUNNEL RELEASE   03/26/2011   right hand  . CARPAL TUNNEL RELEASE  05/28/2011   Procedure: CARPAL TUNNEL RELEASE;  Surgeon: [name];  Location: [facility];  Service: Orthopedics;  Laterality: Left;  . CHOLECYSTECTOMY N/A 01/24/2013   Procedure: LAPAROSCOPIC CHOLECYSTECTOMY WITH INTRAOPERATIVE CHOLANGIOGRAM;  Surgeon: [name], MD;  Location: [facility];  Service: General;  Laterality: N/A;  . COLONOSCOPY    . HYSTEROSCOPY    . POLYPECTOMY    . TONSILLECTOMY  1967   as child  . trigger fingers  8 yrs. ago   both done months apart  . uterine ablation  06/04/2010  . uterine ablation      SOCIAL HISTORY: Social History   Tobacco Use  . Smoking status: Never Smoker  . Smokeless tobacco: Never Used  Substance Use Topics  . Alcohol use: No  Comment: none since 1996  . Drug use: No    FAMILY HISTORY: Family History  Problem Relation Age of Onset  . Heart disease Mother   . Hypertension Mother   . Stroke Mother   . Dementia Mother        vascular dementia  . Hyperlipidemia Mother   . Depression Mother   . Anxiety disorder Mother   . Alcohol abuse Mother   . Obesity Mother   . Pulmonary fibrosis Father        d. 27  . Depression Father   . Anxiety disorder Father   . Alcohol abuse Father   . Heart attack Maternal Grandmother        d. [phone]  . Heart disease Maternal Grandmother   . Heart disease Maternal Grandfather        d. 60-63  . Leukemia Paternal Grandmother        d. late 13s  . Breast cancer Sister 59       IDC s/p mastectomy and chemo    ROS: Review of Systems  Constitutional: Negative for weight loss.  Gastrointestinal: Negative for nausea and vomiting.  Genitourinary: Negative for frequency.  Musculoskeletal:       Negative for muscle weakness  Endo/Heme/Allergies: Negative for polydipsia.       Negative for hypoglycemia Negative for polyphagia    PHYSICAL EXAM: Blood pressure (!) 145/8, pulse 95, temperature (!) 97.5 F (36.4 C), temperature  source Oral, height 5\' 7"  (1.702 m), weight 239 lb (108.4 kg), SpO2 96 %. Body mass index is 37.43 kg/m. Physical Exam Vitals signs reviewed.  Constitutional:      Appearance: Normal appearance. She is obese.  Cardiovascular:     Rate and Rhythm: Normal rate.     Pulses: Normal pulses.  Pulmonary:     Effort: Pulmonary effort is normal.  Musculoskeletal: Normal range of motion.  Skin:    General: Skin is warm and dry.  Neurological:     Mental Status: She is alert and oriented to person, place, and time.  Psychiatric:        Mood and Affect: Mood normal.        Behavior: Behavior normal.     RECENT LABS AND TESTS: BMET    Component Value Date/Time   NA 139 02/18/2018 1011   NA 141 04/16/2017 1409   K 4.1 02/18/2018 1011   K 3.8 04/16/2017 1409   CL 100 02/18/2018 1011   CO2 26 02/18/2018 1011   CO2 25 04/16/2017 1409   GLUCOSE 105 (H) 02/18/2018 1011   GLUCOSE 79 11/11/2017 1127   GLUCOSE 134 04/16/2017 1409   BUN 15 02/18/2018 1011   BUN 12.4 04/16/2017 1409   CREATININE 0.77 02/18/2018 1011   CREATININE 0.9 04/16/2017 1409   CALCIUM 9.9 02/18/2018 1011   CALCIUM 9.6 04/16/2017 1409   GFRNONAA 87 02/18/2018 1011   GFRAA 100 02/18/2018 1011   Lab Results  Component Value Date   HGBA1C 5.9 (H) 02/18/2018   HGBA1C 6.2 (H) 09/17/2017   Lab Results  Component Value Date   INSULIN 15.2 02/18/2018   INSULIN 24.2 09/17/2017   CBC    Component Value Date/Time   WBC 6.8 11/11/2017 1127   RBC 5.02 11/11/2017 1127   HGB 14.2 11/11/2017 1127   HGB 13.7 04/16/2017 1409   HCT 42.2 11/11/2017 1127   HCT 41.2 04/16/2017 1409   PLT 248 11/11/2017 1127   PLT 259 04/16/2017 1409   MCV  84.1 11/11/2017 1127   MCV 84.4 04/16/2017 1409   MCH 28.2 11/11/2017 1127   MCHC 33.5 11/11/2017 1127   RDW 13.1 11/11/2017 1127   RDW 13.2 04/16/2017 1409   LYMPHSABS 1.7 11/11/2017 1127   LYMPHSABS 1.9 04/16/2017 1409   MONOABS 0.8 11/11/2017 1127   MONOABS 0.6 04/16/2017 1409    EOSABS 0.3 11/11/2017 1127   EOSABS 0.1 04/16/2017 1409   BASOSABS 0.0 11/11/2017 1127   BASOSABS 0.0 04/16/2017 1409   Iron/TIBC/Ferritin/ %Sat No results found for: IRON, TIBC, FERRITIN, IRONPCTSAT Lipid Panel     Component Value Date/Time   CHOL 181 02/18/2018 1011   TRIG 222 (H) 02/18/2018 1011   HDL 49 02/18/2018 1011   LDLCALC 88 02/18/2018 1011   Hepatic Function Panel     Component Value Date/Time   PROT 7.2 02/18/2018 1011   PROT 7.0 04/16/2017 1409   ALBUMIN 4.5 02/18/2018 1011   ALBUMIN 4.0 04/16/2017 1409   AST 15 02/18/2018 1011   AST 29 04/16/2017 1409   ALT 20 02/18/2018 1011   ALT 34 04/16/2017 1409   ALKPHOS 94 02/18/2018 1011   ALKPHOS 74 04/16/2017 1409   BILITOT 0.3 02/18/2018 1011   BILITOT 0.40 04/16/2017 1409      Component Value Date/Time   TSH 2.390 09/17/2017 1140    Ref. Range 02/18/2018 10:11  Vitamin D, 25-Hydroxy Latest Ref Range: 30.0 - 100.0 ng/mL 43.2      OBESITY BEHAVIORAL INTERVENTION VISIT  Today's visit was # 17   Starting weight: 249 lbs Starting date: 09/17/2017 Today's weight :: 239 lb  Today's date: 07/15/2018 Total lbs lost to date: 10   ASK: We discussed the diagnosis of obesity with Candace [name] today and Candace [name] agreed to give [name] permission to discuss obesity behavioral modification therapy today.  ASSESS: Candace [name] has the diagnosis of obesity and her BMI today is 37.42 Candace [name] is in the action stage of change   ADVISE: Candace [name] was educated on the multiple health risks of obesity as well as the benefit of weight loss to improve her health. She was advised of the need for long term treatment and the importance of lifestyle modifications to improve her current health and to decrease her risk of future health problems.  AGREE: Multiple dietary modification options and treatment options were discussed and  Candace [name] agreed to follow the recommendations documented in the above note.  ARRANGE: Candace [name] was educated on the  importance of frequent visits to treat obesity as outlined per CMS and USPSTF guidelines and agreed to schedule her next follow up appointment today.  I, Tammy. Wysor, am acting as Location manager for [employer], PA-C I, [name], PA-C have reviewed above note and agree with its content

## 2018-08-06 ENCOUNTER — Ambulatory Visit (INDEPENDENT_AMBULATORY_CARE_PROVIDER_SITE_OTHER): Payer: 59 | Admitting: Physician Assistant

## 2018-08-06 ENCOUNTER — Encounter (INDEPENDENT_AMBULATORY_CARE_PROVIDER_SITE_OTHER): Payer: Self-pay | Admitting: Physician Assistant

## 2018-08-06 VITALS — BP 138/83 | HR 86 | Temp 98.7°F | Ht 67.0 in | Wt 236.0 lb

## 2018-08-06 DIAGNOSIS — Z9189 Other specified personal risk factors, not elsewhere classified: Secondary | ICD-10-CM

## 2018-08-06 DIAGNOSIS — Z6837 Body mass index (BMI) 37.0-37.9, adult: Secondary | ICD-10-CM

## 2018-08-06 DIAGNOSIS — E7849 Other hyperlipidemia: Secondary | ICD-10-CM

## 2018-08-06 DIAGNOSIS — E559 Vitamin D deficiency, unspecified: Secondary | ICD-10-CM | POA: Diagnosis not present

## 2018-08-06 DIAGNOSIS — R7303 Prediabetes: Secondary | ICD-10-CM

## 2018-08-06 MED ORDER — VITAMIN D (ERGOCALCIFEROL) 1.25 MG (50000 UNIT) PO CAPS: 50000.0000 [IU] | ORAL_CAPSULE | ORAL | 0 refills | Status: DC

## 2018-08-06 MED ORDER — METFORMIN HCL 500 MG PO TABS: 500.0000 mg | ORAL_TABLET | Freq: Every day | ORAL | 0 refills | Status: DC

## 2018-08-06 MED FILL — VIT D2 1.25 MG (50,000 UNIT: 1.25 MG | 28 days supply | Qty: 4 | Fill #0

## 2018-08-06 MED FILL — metFORMIN HCL 500 MG TABS: 500 | 30 days supply | Qty: 30 | Fill #0

## 2018-08-06 NOTE — Progress Notes (Signed)
Office: [phone]  /  Fax: [phone]   HPI:   Chief Complaint: OBESITY Candace [name] is here to discuss her progress with her obesity treatment plan. She is on the Category 4 plan and is following her eating plan approximately 65% of the time. She states she is exercising 0 minutes 0 times per week. Candace [name] did well with weight loss. She is getting all of her food in on her plan. She didn't work out last week due to being sick.  Her weight is 236 lb (107 kg) today and has had a weight loss of 3 pounds over a period of 3 weeks since her last visit. She has lost 13 lbs since starting treatment with [name].  Pre-Diabetes Candace [name] has a diagnosis of prediabetes based on her elevated HgA1c and was informed this puts her at greater risk of developing diabetes. She is taking metformin currently with no nausea, vomiting, or diarrhea and continues to work on diet and exercise to decrease risk of diabetes.   Vitamin D deficiency Candace [name] has a diagnosis of Vitamin D deficiency. She is currently taking prescription Vit D and denies nausea, vomiting or muscle weakness.  Hyperlipidemia Candace [name] has hyperlipidemia and has been trying to improve her cholesterol levels with intensive lifestyle modification including a low saturated fat diet, exercise and weight loss. Candace [name] is on Crestor. She denies any chest pain.  At risk for osteopenia and osteoporosis Candace [name] is at higher risk of osteopenia and osteoporosis due to vitamin D deficiency.   ASSESSMENT AND PLAN:  Prediabetes - Plan: Comprehensive metabolic panel, Hemoglobin A1c, metFORMIN (GLUCOPHAGE) 500 MG tablet  Vitamin D deficiency - Plan: VITAMIN D 25 Hydroxy (Vit-D Deficiency, Fractures), Vitamin D, Ergocalciferol, (DRISDOL) 1.25 MG (50000 UT) CAPS capsule  Other hyperlipidemia - Plan: Lipid Panel With LDL/HDL Ratio  At risk for osteoporosis  Class 2 severe obesity with serious comorbidity and body mass index (BMI) of 37.0 to 37.9 in adult, unspecified  obesity type (Candace [name])  PLAN:  Pre-Diabetes Candace [name] will continue to work on weight loss, exercise, and decreasing simple carbohydrates in her diet to help decrease the risk of diabetes. We dicussed metformin including benefits and risks. She was informed that eating too many simple carbohydrates or too many calories at one sitting increases the likelihood of GI side effects. Megham will continue metformin for now and a prescription was written today for #30 with no refills. We will check labs today. Candace [name] agrees to follow-up with our clinic in 3 weeks.   Vitamin D Deficiency Candace [name] was informed that low Vitamin D levels contributes to fatigue and are associated with obesity, breast, and colon cancer. She agrees to continue to take prescription Vit D @ 50,000 IU every week #4 with no refills and will have labs drawn today. She was informed of the risk of over-replacement of Vitamin D and agrees to not increase her dose unless she discusses this with [name] first. Candace [name] agrees to follow-up with our clinic in 3 weeks.   Hyperlipidemia Candace [name] was informed of the American Heart Association Guidelines emphasizing intensive lifestyle modifications as the first line treatment for hyperlipidemia. We discussed many lifestyle modifications today in depth, and Ahlana will continue to work on decreasing saturated fats such as fatty red meat, butter and many fried foods. She will also increase vegetables and lean protein in her diet and continue to work on exercise and weight loss efforts. Nakshatra will continue Crestor and agrees to follow-up with our clinic in 3 weeks.   At risk  for osteopenia and osteoporosis Candace [name] was given extended  (15 minutes) osteoporosis prevention counseling today. Candace [name] is at risk for osteopenia and osteoporsis due to her vitamin D deficiency. She was encouraged to take her vitamin D and follow her higher calcium diet and increase strengthening exercise to help strengthen her bones and  decrease her risk of osteopenia and osteoporosis.  Obesity Candace [name] is currently in the action stage of change. As such, her goal is to continue with weight loss efforts. She has agreed to follow the Category 4 plan. Candace [name] has been instructed to work up to a goal of 150 minutes of combined cardio and strengthening exercise per week for weight loss and overall health benefits. We discussed the following Behavioral Modification Strategies today: keeping healthy foods in the home and ways to avoid boredom eating.  Candace [name] has agreed to follow up with our clinic in 3 weeks. She was informed of the importance of frequent follow up visits to maximize her success with intensive lifestyle modifications for her multiple health conditions.  ALLERGIES: No Known Allergies  MEDICATIONS: Current Outpatient Medications on File Prior to Visit  Medication Sig Dispense Refill  . anastrozole (ARIMIDEX) 1 MG tablet TAKE 1 TABLET (1 MG TOTAL) BY MOUTH DAILY. 90 tablet 4  . buPROPion (WELLBUTRIN XL) 150 MG 24 hr tablet Take 150 mg by mouth daily.    [name] buPROPion (WELLBUTRIN XL) 300 MG 24 hr tablet Take 300 mg by mouth daily.     . cetirizine (ZYRTEC) 10 MG tablet Take 10 mg by mouth daily. [facility]    . esomeprazole (NEXIUM) 40 MG capsule Take 40 mg by mouth every 36 (thirty-six) hours.    [name] FLUoxetine (PROZAC) 10 MG capsule Take 10 mg by mouth daily with lunch.     . Multiple Vitamin (MULTIVITAMIN WITH MINERALS) TABS tablet Take 1 tablet by mouth daily.    [name] rOPINIRole (REQUIP) 0.25 MG tablet Take 0.5 mg by mouth daily at 8 pm.    . rosuvastatin (CRESTOR) 10 MG tablet Take 10 mg by mouth at bedtime.     No current facility-administered medications on file prior to visit.     PAST MEDICAL HISTORY: Past Medical History:  Diagnosis Date  . Abnormal Pap smear   . Alcohol abuse   . Anovulation   . Anxiety   . Breast cancer ([place]) 04/05/2016   right breast  . Cancer ([place]) 2017   right  breast cancer  . Complication of anesthesia    slow to wake up  . Depression   . Endometrial hyperplasia   . Endometrial polyp    h/o  . Epidermal cyst    h/o  . Gallbladder problem   . GERD (gastroesophageal reflux disease)   . H/O hemorrhoids   . H/O menorrhagia   . High risk HPV infection   . Hyperlipidemia   . Insomnia   . Obesity   . Oligomenorrhea   . Prediabetes   . Restless legs syndrome   . Seasonal allergies   . Vitamin D deficiency     PAST SURGICAL HISTORY: Past Surgical History:  Procedure Laterality Date  . BREAST LUMPECTOMY Right 05/2016   radiation  . BREAST LUMPECTOMY WITH RADIOACTIVE SEED AND SENTINEL LYMPH NODE BIOPSY Right 05/28/2016   Procedure: RADIOACTIVE SEED GUIDED RIGHT BREAST LUMPECTOMY WITH  RIGHT AXILLARY SENTINEL LYMPH NODE BIOPSY;  Surgeon: [name], MD;  Location: [facility];  Service: General;  Laterality: Right;  . CARPAL TUNNEL RELEASE  03/26/2011  right hand  . CARPAL TUNNEL RELEASE  05/28/2011   Procedure: CARPAL TUNNEL RELEASE;  Surgeon: [name];  Location: [facility];  Service: Orthopedics;  Laterality: Left;  . CHOLECYSTECTOMY N/A 01/24/2013   Procedure: LAPAROSCOPIC CHOLECYSTECTOMY WITH INTRAOPERATIVE CHOLANGIOGRAM;  Surgeon: [name], MD;  Location: [facility];  Service: General;  Laterality: N/A;  . COLONOSCOPY    . HYSTEROSCOPY    . POLYPECTOMY    . TONSILLECTOMY  1967   as child  . trigger fingers  8 yrs. ago   both done months apart  . uterine ablation  06/04/2010  . uterine ablation      SOCIAL HISTORY: Social History   Tobacco Use  . Smoking status: Never Smoker  . Smokeless tobacco: Never Used  Substance Use Topics  . Alcohol use: No    Comment: none since 1996  . Drug use: No    FAMILY HISTORY: Family History  Problem Relation Age of Onset  . Heart disease Mother   . Hypertension Mother   . Stroke Mother   . Dementia Mother        vascular dementia  . Hyperlipidemia Mother   .  Depression Mother   . Anxiety disorder Mother   . Alcohol abuse Mother   . Obesity Mother   . Pulmonary fibrosis Father        d. 63  . Depression Father   . Anxiety disorder Father   . Alcohol abuse Father   . Heart attack Maternal Grandmother        d. [phone]  . Heart disease Maternal Grandmother   . Heart disease Maternal Grandfather        d. 60-63  . Leukemia Paternal Grandmother        d. late 77s  . Breast cancer Sister 46       IDC s/p mastectomy and chemo   ROS: Review of Systems  Constitutional: Positive for weight loss.  Cardiovascular: Negative for chest pain.  Gastrointestinal: Negative for diarrhea, nausea and vomiting.  Musculoskeletal:       Negative muscle weakness  Endo/Heme/Allergies:       Negative polyphagia   PHYSICAL EXAM: Blood pressure 138/83, pulse 86, temperature 98.7 F (37.1 C), temperature source Oral, height 5\' 7"  (1.702 m), weight 236 lb (107 kg), SpO2 97 %. Body mass index is 36.96 kg/m. Physical Exam Vitals signs reviewed.  Constitutional:      Appearance: Normal appearance. She is obese.  Cardiovascular:     Rate and Rhythm: Normal rate.     Pulses: Normal pulses.  Pulmonary:     Effort: Pulmonary effort is normal.     Breath sounds: Normal breath sounds.  Musculoskeletal: Normal range of motion.  Skin:    General: Skin is warm and dry.  Neurological:     Mental Status: She is alert and oriented to person, place, and time.  Psychiatric:        Mood and Affect: Mood normal.        Behavior: Behavior normal.   RECENT LABS AND TESTS: BMET    Component Value Date/Time   NA 139 02/18/2018 1011   NA 141 04/16/2017 1409   K 4.1 02/18/2018 1011   K 3.8 04/16/2017 1409   CL 100 02/18/2018 1011   CO2 26 02/18/2018 1011   CO2 25 04/16/2017 1409   GLUCOSE 105 (H) 02/18/2018 1011   GLUCOSE 79 11/11/2017 1127   GLUCOSE 134 04/16/2017 1409   BUN 15 02/18/2018  1011   BUN 12.4 04/16/2017 1409   CREATININE 0.77 02/18/2018 1011    CREATININE 0.9 04/16/2017 1409   CALCIUM 9.9 02/18/2018 1011   CALCIUM 9.6 04/16/2017 1409   GFRNONAA 87 02/18/2018 1011   GFRAA 100 02/18/2018 1011   Lab Results  Component Value Date   HGBA1C 5.9 (H) 02/18/2018   HGBA1C 6.2 (H) 09/17/2017   Lab Results  Component Value Date   INSULIN 15.2 02/18/2018   INSULIN 24.2 09/17/2017   CBC    Component Value Date/Time   WBC 6.8 11/11/2017 1127   RBC 5.02 11/11/2017 1127   HGB 14.2 11/11/2017 1127   HGB 13.7 04/16/2017 1409   HCT 42.2 11/11/2017 1127   HCT 41.2 04/16/2017 1409   PLT 248 11/11/2017 1127   PLT 259 04/16/2017 1409   MCV 84.1 11/11/2017 1127   MCV 84.4 04/16/2017 1409   MCH 28.2 11/11/2017 1127   MCHC 33.5 11/11/2017 1127   RDW 13.1 11/11/2017 1127   RDW 13.2 04/16/2017 1409   LYMPHSABS 1.7 11/11/2017 1127   LYMPHSABS 1.9 04/16/2017 1409   MONOABS 0.8 11/11/2017 1127   MONOABS 0.6 04/16/2017 1409   EOSABS 0.3 11/11/2017 1127   EOSABS 0.1 04/16/2017 1409   BASOSABS 0.0 11/11/2017 1127   BASOSABS 0.0 04/16/2017 1409   Iron/TIBC/Ferritin/ %Sat No results found for: IRON, TIBC, FERRITIN, IRONPCTSAT Lipid Panel     Component Value Date/Time   CHOL 181 02/18/2018 1011   TRIG 222 (H) 02/18/2018 1011   HDL 49 02/18/2018 1011   LDLCALC 88 02/18/2018 1011   Hepatic Function Panel     Component Value Date/Time   PROT 7.2 02/18/2018 1011   PROT 7.0 04/16/2017 1409   ALBUMIN 4.5 02/18/2018 1011   ALBUMIN 4.0 04/16/2017 1409   AST 15 02/18/2018 1011   AST 29 04/16/2017 1409   ALT 20 02/18/2018 1011   ALT 34 04/16/2017 1409   ALKPHOS 94 02/18/2018 1011   ALKPHOS 74 04/16/2017 1409   BILITOT 0.3 02/18/2018 1011   BILITOT 0.40 04/16/2017 1409      Component Value Date/Time   TSH 2.390 09/17/2017 1140   Results for NAISHA, WISDOM (MRN [id number]) as of 08/06/2018 10:27  Ref. Range 02/18/2018 10:11  Vitamin D, 25-Hydroxy Latest Ref Range: 30.0 - 100.0 ng/mL 43.2   OBESITY BEHAVIORAL INTERVENTION  VISIT  Today's visit was #18  Starting weight: 249 lbs Starting date: 09/17/2017 Today's weight: 236 lbs Today's date: 08/06/2018 Total lbs lost to date:13  ASK: We discussed the diagnosis of obesity with Leland Johns today and [place] agreed to give [name] permission to discuss obesity behavioral modification therapy today.  ASSESS: Ethelreda has the diagnosis of obesity and her BMI today is @ 36.96. Dezire is in the action stage of change.   ADVISE: Carmie was educated on the multiple health risks of obesity as well as the benefit of weight loss to improve her health. She was advised of the need for long term treatment and the importance of lifestyle modifications to improve her current health and to decrease her risk of future health problems.  AGREE: Multiple dietary modification options and treatment options were discussed and  Milcah agreed to follow the recommendations documented in the above note.  ARRANGE: Somer was educated on the importance of frequent visits to treat obesity as outlined per CMS and USPSTF guidelines and agreed to schedule her next follow up appointment today.  IMichaelene Song, am acting as Location manager for [employer],  PA-C

## 2018-08-07 LAB — COMPREHENSIVE METABOLIC PANEL
A/G RATIO: 2.2 (ref 1.2–2.2)
ALT: 17 IU/L (ref 0–32)
AST: 15 IU/L (ref 0–40)
Albumin: 4.6 g/dL (ref 3.8–4.9)
Alkaline Phosphatase: 93 IU/L (ref 39–117)
BUN / CREAT RATIO: 19 (ref 9–23)
BUN: 15 mg/dL (ref 6–24)
Bilirubin Total: 0.3 mg/dL (ref 0.0–1.2)
CALCIUM: 10 mg/dL (ref 8.7–10.2)
CO2: 24 mmol/L (ref 20–29)
Chloride: 103 mmol/L (ref 96–106)
Creatinine, Ser: 0.78 mg/dL (ref 0.57–1.00)
GFR, EST AFRICAN AMERICAN: 98 mL/min/{1.73_m2} (ref >59)
GFR, EST NON AFRICAN AMERICAN: 85 mL/min/{1.73_m2} (ref >59)
GLOBULIN, TOTAL: 2.1 g/dL (ref 1.5–4.5)
Glucose: 103 mg/dL — ABNORMAL HIGH (ref 65–99)
POTASSIUM: 4.5 mmol/L (ref 3.5–5.2)
Sodium: 144 mmol/L (ref 134–144)
TOTAL PROTEIN: 6.7 g/dL (ref 6.0–8.5)

## 2018-08-07 LAB — LIPID PANEL WITH LDL/HDL RATIO
Cholesterol, Total: 160 mg/dL (ref 100–199)
HDL: 47 mg/dL (ref >39)
LDL Calculated: 72 mg/dL (ref 0–99)
LDl/HDL Ratio: 1.5 ratio (ref 0.0–3.2)
Triglycerides: 205 mg/dL — ABNORMAL HIGH (ref 0–149)
VLDL Cholesterol Cal: 41 mg/dL — ABNORMAL HIGH (ref 5–40)

## 2018-08-07 LAB — HEMOGLOBIN A1C
Est. average glucose Bld gHb Est-mCnc: 123 mg/dL
Hgb A1c MFr Bld: 5.9 % — ABNORMAL HIGH (ref 4.8–5.6)

## 2018-08-07 LAB — VITAMIN D 25 HYDROXY (VIT D DEFICIENCY, FRACTURES): Vit D, 25-Hydroxy: 49.5 ng/mL (ref 30.0–100.0)

## 2018-08-07 LAB — INSULIN, RANDOM: INSULIN: 20.6 u[IU]/mL (ref 2.6–24.9)

## 2018-08-11 MED FILL — FLUoxetine HCL 10 MG CAPS: 10 | 90 days supply | Qty: 90 | Fill #1

## 2018-08-27 ENCOUNTER — Ambulatory Visit (INDEPENDENT_AMBULATORY_CARE_PROVIDER_SITE_OTHER): Payer: 59 | Admitting: Physician Assistant

## 2018-08-27 ENCOUNTER — Encounter (INDEPENDENT_AMBULATORY_CARE_PROVIDER_SITE_OTHER): Payer: Self-pay | Admitting: Physician Assistant

## 2018-08-27 ENCOUNTER — Ambulatory Visit (INDEPENDENT_AMBULATORY_CARE_PROVIDER_SITE_OTHER): Payer: 59 | Admitting: Orthopaedic Surgery

## 2018-08-27 ENCOUNTER — Encounter (INDEPENDENT_AMBULATORY_CARE_PROVIDER_SITE_OTHER): Payer: Self-pay | Admitting: Orthopaedic Surgery

## 2018-08-27 VITALS — BP 152/86 | HR 84 | Temp 98.0°F | Ht 67.0 in | Wt 240.0 lb

## 2018-08-27 DIAGNOSIS — M65341 Trigger finger, right ring finger: Secondary | ICD-10-CM

## 2018-08-27 DIAGNOSIS — E559 Vitamin D deficiency, unspecified: Secondary | ICD-10-CM

## 2018-08-27 DIAGNOSIS — R7303 Prediabetes: Secondary | ICD-10-CM | POA: Diagnosis not present

## 2018-08-27 DIAGNOSIS — M65331 Trigger finger, right middle finger: Secondary | ICD-10-CM

## 2018-08-27 DIAGNOSIS — Z6837 Body mass index (BMI) 37.0-37.9, adult: Secondary | ICD-10-CM

## 2018-08-27 DIAGNOSIS — Z9189 Other specified personal risk factors, not elsewhere classified: Secondary | ICD-10-CM

## 2018-08-27 MED ORDER — METHYLPREDNISOLONE ACETATE 40 MG/ML IJ SUSP
20.0000 mg | INTRAMUSCULAR | Status: AC | PRN
  Administered 2018-08-27: 20 mg

## 2018-08-27 MED ORDER — METFORMIN HCL 500 MG PO TABS: 500.0000 mg | ORAL_TABLET | Freq: Every day | ORAL | 0 refills | Status: DC

## 2018-08-27 MED ORDER — LIDOCAINE HCL 1 % IJ SOLN
0.5000 mL | INTRAMUSCULAR | Status: AC | PRN
  Administered 2018-08-27: .5 mL

## 2018-08-27 MED ORDER — VITAMIN D-3 125 MCG (5000 UT) PO TABS: 5000.0000 [IU] | ORAL_TABLET | Freq: Every day | ORAL | Status: DC

## 2018-08-27 NOTE — Progress Notes (Signed)
Office: [phone]  /  Fax: [phone]   HPI:   Chief Complaint: OBESITY Candace [name] is here to discuss her progress with her obesity treatment plan. She is on the Category 4 plan and is following her eating plan approximately 60 % of the time. She states she is exercising 0 minutes 0 times per week. Candace [name] reports struggling to follow the plan due to family stress as well as food temptations at home. She is ready to get back on track.  Her weight is 240 lb (108.9 kg) today and has had a weight gain of 4 pounds over a period of 3 weeks since her last visit. She has lost 9 lbs since starting treatment with [name].  Vitamin D deficiency Candace [name] has a diagnosis of vitamin D deficiency. She is currently taking vit D and denies nausea, vomiting, or muscle weakness.  At risk for osteopenia and osteoporosis Candace [name] is at higher risk of osteopenia and osteoporosis due to vitamin D deficiency.   Pre-Diabetes Candace [name] has a diagnosis of pre-diabetes based on her elevated Hgb A1c of 5.9 on 08/06/18 and was informed this puts her at greater risk of developing diabetes. She is taking metformin currently and continues to work on diet and exercise to decrease risk of diabetes. She denies polyphagia, nausea, vomiting, or diarrhea.  ASSESSMENT AND PLAN:  Prediabetes - Plan: metFORMIN (GLUCOPHAGE) 500 MG tablet  Vitamin D deficiency - Plan: Cholecalciferol (VITAMIN D-3) 125 MCG (5000 UT) TABS  At risk for osteoporosis  Class 2 severe obesity with serious comorbidity and body mass index (BMI) of 37.0 to 37.9 in adult, unspecified obesity type ([place])  PLAN:  Vitamin D Deficiency Candace [name] was informed that low vitamin D levels contributes to fatigue and are associated with obesity, breast, and colon cancer. Candace [name] agrees to change to take OTC Vit D @5 ,000 IU every day and will follow up for routine testing of vitamin D, at least 2-3 times per year. She was informed of the risk of over-replacement of vitamin D and  agrees to not increase her dose unless she discusses this with [name] first. Candace [name] agrees to follow up in 2 weeks as directed.  At risk for osteopenia and osteoporosis Candace [name] was given extended (15 minutes) osteoporosis prevention counseling today. Candace [name] is at risk for osteopenia and osteoporosis due to her vitamin D deficiency. She was encouraged to take her vitamin D and follow her higher calcium diet and increase strengthening exercise to help strengthen her bones and decrease her risk of osteopenia and osteoporosis.  Pre-Diabetes Candace [name] will continue to work on weight loss, exercise, and decreasing simple carbohydrates in her diet to help decrease the risk of diabetes. She was informed that eating too many simple carbohydrates or too many calories at one sitting increases the likelihood of GI side effects. Candace [name] agreed to continue metformin 500mg  qAM #30 with no refills and a prescription was written today. Shayle agreed to follow up with [name] as directed to monitor her progress.  Obesity Candace [name] is currently in the action stage of change. As such, her goal is to continue with weight loss efforts. She has agreed to follow the Category 4 plan. Candace [name] has been instructed to work up to a goal of 150 minutes of combined cardio and strengthening exercise per week for weight loss and overall health benefits. We discussed the following Behavioral Modification Strategies today: work on meal planning and easy cooking plans and keeping healthy foods in the home.  Candace [name] has agreed to follow up with  our clinic in 2 weeks. She was informed of the importance of frequent follow up visits to maximize her success with intensive lifestyle modifications for her multiple health conditions.  ALLERGIES: No Known Allergies  MEDICATIONS: Current Outpatient Medications on File Prior to Visit  Medication Sig Dispense Refill  . anastrozole (ARIMIDEX) 1 MG tablet TAKE 1 TABLET (1 MG TOTAL) BY MOUTH DAILY. 90 tablet 4    . buPROPion (WELLBUTRIN XL) 150 MG 24 hr tablet Take 150 mg by mouth daily.    [name] buPROPion (WELLBUTRIN XL) 300 MG 24 hr tablet Take 300 mg by mouth daily.     . cetirizine (ZYRTEC) 10 MG tablet Take 10 mg by mouth daily. [facility]    . esomeprazole (NEXIUM) 40 MG capsule Take 40 mg by mouth every 36 (thirty-six) hours.    [name] FLUoxetine (PROZAC) 10 MG capsule Take 10 mg by mouth daily with lunch.     . Multiple Vitamin (MULTIVITAMIN WITH MINERALS) TABS tablet Take 1 tablet by mouth daily.    [name] rOPINIRole (REQUIP) 0.25 MG tablet Take 0.5 mg by mouth daily at 8 pm.    . rosuvastatin (CRESTOR) 10 MG tablet Take 10 mg by mouth at bedtime.     No current facility-administered medications on file prior to visit.     PAST MEDICAL HISTORY: Past Medical History:  Diagnosis Date  . Abnormal Pap smear   . Alcohol abuse   . Anovulation   . Anxiety   . Breast cancer ([place]) 04/05/2016   right breast  . Cancer ([place]) 2017   right breast cancer  . Complication of anesthesia    slow to wake up  . Depression   . Endometrial hyperplasia   . Endometrial polyp    h/o  . Epidermal cyst    h/o  . Gallbladder problem   . GERD (gastroesophageal reflux disease)   . H/O hemorrhoids   . H/O menorrhagia   . High risk HPV infection   . Hyperlipidemia   . Insomnia   . Obesity   . Oligomenorrhea   . Prediabetes   . Restless legs syndrome   . Seasonal allergies   . Vitamin D deficiency     PAST SURGICAL HISTORY: Past Surgical History:  Procedure Laterality Date  . BREAST LUMPECTOMY Right 05/2016   radiation  . BREAST LUMPECTOMY WITH RADIOACTIVE SEED AND SENTINEL LYMPH NODE BIOPSY Right 05/28/2016   Procedure: RADIOACTIVE SEED GUIDED RIGHT BREAST LUMPECTOMY WITH  RIGHT AXILLARY SENTINEL LYMPH NODE BIOPSY;  Surgeon: [name], MD;  Location: [facility];  Service: General;  Laterality: Right;  . CARPAL TUNNEL RELEASE  03/26/2011   right hand  . CARPAL TUNNEL RELEASE   05/28/2011   Procedure: CARPAL TUNNEL RELEASE;  Surgeon: [name];  Location: [facility];  Service: Orthopedics;  Laterality: Left;  . CHOLECYSTECTOMY N/A 01/24/2013   Procedure: LAPAROSCOPIC CHOLECYSTECTOMY WITH INTRAOPERATIVE CHOLANGIOGRAM;  Surgeon: [name], MD;  Location: [facility];  Service: General;  Laterality: N/A;  . COLONOSCOPY    . HYSTEROSCOPY    . POLYPECTOMY    . TONSILLECTOMY  1967   as child  . trigger fingers  8 yrs. ago   both done months apart  . uterine ablation  06/04/2010  . uterine ablation      SOCIAL HISTORY: Social History   Tobacco Use  . Smoking status: Never Smoker  . Smokeless tobacco: Never Used  Substance Use Topics  . Alcohol use: No  Comment: none since 1996  . Drug use: No    FAMILY HISTORY: Family History  Problem Relation Age of Onset  . Heart disease Mother   . Hypertension Mother   . Stroke Mother   . Dementia Mother        vascular dementia  . Hyperlipidemia Mother   . Depression Mother   . Anxiety disorder Mother   . Alcohol abuse Mother   . Obesity Mother   . Pulmonary fibrosis Father        d. 60  . Depression Father   . Anxiety disorder Father   . Alcohol abuse Father   . Heart attack Maternal Grandmother        d. [phone]  . Heart disease Maternal Grandmother   . Heart disease Maternal Grandfather        d. 60-63  . Leukemia Paternal Grandmother        d. late 34s  . Breast cancer Sister 18       IDC s/p mastectomy and chemo    ROS: Review of Systems  Constitutional: Negative for weight loss.  Gastrointestinal: Negative for diarrhea, nausea and vomiting.  Musculoskeletal:       Negative for muscle weakness.  Endo/Heme/Allergies:       Negative for polyphagia.    PHYSICAL EXAM: Blood pressure (!) 152/86, pulse 84, temperature 98 F (36.7 C), height 5\' 7"  (1.702 m), weight 240 lb (108.9 kg), SpO2 94 %. Body mass index is 37.59 kg/m. Physical Exam Vitals signs reviewed.  Constitutional:        Appearance: Normal appearance. She is obese.  Cardiovascular:     Rate and Rhythm: Normal rate.  Pulmonary:     Effort: Pulmonary effort is normal.  Musculoskeletal: Normal range of motion.  Skin:    General: Skin is warm and dry.  Neurological:     Mental Status: She is alert and oriented to person, place, and time.  Psychiatric:        Mood and Affect: Mood normal.        Behavior: Behavior normal.     RECENT LABS AND TESTS: BMET    Component Value Date/Time   NA 144 08/06/2018 0929   NA 141 04/16/2017 1409   K 4.5 08/06/2018 0929   K 3.8 04/16/2017 1409   CL 103 08/06/2018 0929   CO2 24 08/06/2018 0929   CO2 25 04/16/2017 1409   GLUCOSE 103 (H) 08/06/2018 0929   GLUCOSE 79 11/11/2017 1127   GLUCOSE 134 04/16/2017 1409   BUN 15 08/06/2018 0929   BUN 12.4 04/16/2017 1409   CREATININE 0.78 08/06/2018 0929   CREATININE 0.9 04/16/2017 1409   CALCIUM 10.0 08/06/2018 0929   CALCIUM 9.6 04/16/2017 1409   GFRNONAA 85 08/06/2018 0929   GFRAA 98 08/06/2018 0929   Lab Results  Component Value Date   HGBA1C 5.9 (H) 08/06/2018   HGBA1C 5.9 (H) 02/18/2018   HGBA1C 6.2 (H) 09/17/2017   Lab Results  Component Value Date   INSULIN 20.6 08/06/2018   INSULIN 15.2 02/18/2018   INSULIN 24.2 09/17/2017   CBC    Component Value Date/Time   WBC 6.8 11/11/2017 1127   RBC 5.02 11/11/2017 1127   HGB 14.2 11/11/2017 1127   HGB 13.7 04/16/2017 1409   HCT 42.2 11/11/2017 1127   HCT 41.2 04/16/2017 1409   PLT 248 11/11/2017 1127   PLT 259 04/16/2017 1409   MCV 84.1 11/11/2017 1127   MCV 84.4 04/16/2017 1409  MCH 28.2 11/11/2017 1127   MCHC 33.5 11/11/2017 1127   RDW 13.1 11/11/2017 1127   RDW 13.2 04/16/2017 1409   LYMPHSABS 1.7 11/11/2017 1127   LYMPHSABS 1.9 04/16/2017 1409   MONOABS 0.8 11/11/2017 1127   MONOABS 0.6 04/16/2017 1409   EOSABS 0.3 11/11/2017 1127   EOSABS 0.1 04/16/2017 1409   BASOSABS 0.0 11/11/2017 1127   BASOSABS 0.0 04/16/2017 1409    Iron/TIBC/Ferritin/ %Sat No results found for: IRON, TIBC, FERRITIN, IRONPCTSAT Lipid Panel     Component Value Date/Time   CHOL 160 08/06/2018 0929   TRIG 205 (H) 08/06/2018 0929   HDL 47 08/06/2018 0929   LDLCALC 72 08/06/2018 0929   Hepatic Function Panel     Component Value Date/Time   PROT 6.7 08/06/2018 0929   PROT 7.0 04/16/2017 1409   ALBUMIN 4.6 08/06/2018 0929   ALBUMIN 4.0 04/16/2017 1409   AST 15 08/06/2018 0929   AST 29 04/16/2017 1409   ALT 17 08/06/2018 0929   ALT 34 04/16/2017 1409   ALKPHOS 93 08/06/2018 0929   ALKPHOS 74 04/16/2017 1409   BILITOT 0.3 08/06/2018 0929   BILITOT 0.40 04/16/2017 1409      Component Value Date/Time   TSH 2.390 09/17/2017 1140   Results for PEJA, ALLENDER (MRN [id number]) as of 08/27/2018 13:00  Ref. Range 08/06/2018 09:29  Vitamin D, 25-Hydroxy Latest Ref Range: 30.0 - 100.0 ng/mL 49.5   OBESITY BEHAVIORAL INTERVENTION VISIT  Today's visit was # 19  Starting weight: 249 lbs Starting date: 09/17/17 Today's weight : Weight: 240 lb (108.9 kg)  Today's date: 08/27/2018 Total lbs lost to date: 9    08/27/2018  Height 5\' 7"  (1.702 m)  Weight 240 lb (108.9 kg)  BMI (Calculated) 37.58  BLOOD PRESSURE - SYSTOLIC 409  BLOOD PRESSURE - DIASTOLIC 86   Body Fat % 81.1 %  Total Body Water (lbs) 90.4 lbs   ASK: We discussed the diagnosis of obesity with Leland Johns today and Katalyn agreed to give [name] permission to discuss obesity behavioral modification therapy today.  ASSESS: Telitha has the diagnosis of obesity and her BMI today is 37.58. Kynedi is in the action stage of change.   ADVISE: Kayden was educated on the multiple health risks of obesity as well as the benefit of weight loss to improve her health. She was advised of the need for long term treatment and the importance of lifestyle modifications to improve her current health and to decrease her risk of future health problems.  AGREE: Multiple dietary modification  options and treatment options were discussed and Lilou agreed to follow the recommendations documented in the above note.  ARRANGE: Sarh was educated on the importance of frequent visits to treat obesity as outlined per CMS and USPSTF guidelines and agreed to schedule her next follow up appointment today.  Lenward Chancellor, CMA, am acting as transcriptionist for [name], PA-C I, [name], PA-C have reviewed above note and agree with its content

## 2018-08-27 NOTE — Progress Notes (Signed)
Office Visit Note   Patient: Candace [name]           Date of Birth: 1960-10-16           MRN: [id number] Visit Date: 08/27/2018              Requested by: [name], MD [address], [place] [address] PCP: [name], MD   Assessment & Plan: Visit Diagnoses:  1. Trigger finger, right middle finger   2. Trigger finger, right ring finger     Plan: Per her request I did place steroid injections in the A1 pulley of the right ring and middle fingers which she tolerated well.  She understands we can repeat these again if there is a recurrence.  All question concerns were answered and addressed.  Follow-up as otherwise as needed.  Follow-Up Instructions: Return if symptoms worsen or fail to improve.   Orders:  Orders Placed This Encounter  Procedures  . Hand/UE Inj: R long A1  . Hand/UE Inj: L thumb A1   No orders of the defined types were placed in this encounter.     Procedures: Hand/UE Inj: R long A1 for trigger finger on 08/27/2018 1:48 PM Medications: 0.5 mL lidocaine 1 %; 20 mg methylPREDNISolone acetate 40 MG/ML  Hand/UE Inj: L thumb A1 for trigger finger on 08/27/2018 1:49 PM Medications: 0.5 mL lidocaine 1 %      Clinical Data: No additional findings.   Subjective: Chief Complaint  Patient presents with  . Right Ring Finger - Pain  Anniyah is well-known to me.  She comes in with right middle and ring finger triggering.  She is actually had her A1 pulley released on both thumbs by Dr. Fredna Dow remotely.  She said the middle finger is been certainly triggering but the ring finger is been just more painful on her right hand.  She is interested in injections.  She has not had them in these areas.  She is not a diabetic.  She denies any active medical issues currently.  HPI  Review of Systems She currently denies any headache, chest pain, shortness of breath, fever, chills, nausea, vomiting  Objective: Vital Signs: There were no vitals  taken for this visit.  Physical Exam She is alert and orient x3 and in no acute distress Ortho Exam Examination of her right hand shows pain over the A1 pulley of the middle and ring fingers.  There is active triggering of the middle finger. Specialty Comments:  No specialty comments available.  Imaging: No results found.   PMFS History: Patient Active Problem List   Diagnosis Date Noted  . Genetic testing 05/13/2016  . Malignant neoplasm of upper-outer quadrant of right breast in female, estrogen receptor positive ([place]) 05/02/2016  . Family history of breast cancer in sister 04/23/2016  . Depression   . Endometrial hyperplasia   . Oligomenorrhea   . Insomnia   . H/O hemorrhoids   . Epidermal cyst   . High risk HPV infection   . Carpal tunnel syndrome on left 05/28/2011   Past Medical History:  Diagnosis Date  . Abnormal Pap smear   . Alcohol abuse   . Anovulation   . Anxiety   . Breast cancer ([place]) 04/05/2016   right breast  . Cancer ([place]) 2017   right breast cancer  . Complication of anesthesia    slow to wake up  . Depression   . Endometrial hyperplasia   . Endometrial polyp  h/o  . Epidermal cyst    h/o  . Gallbladder problem   . GERD (gastroesophageal reflux disease)   . H/O hemorrhoids   . H/O menorrhagia   . High risk HPV infection   . Hyperlipidemia   . Insomnia   . Obesity   . Oligomenorrhea   . Prediabetes   . Restless legs syndrome   . Seasonal allergies   . Vitamin D deficiency     Family History  Problem Relation Age of Onset  . Heart disease Mother   . Hypertension Mother   . Stroke Mother   . Dementia Mother        vascular dementia  . Hyperlipidemia Mother   . Depression Mother   . Anxiety disorder Mother   . Alcohol abuse Mother   . Obesity Mother   . Pulmonary fibrosis Father        d. 66  . Depression Father   . Anxiety disorder Father   . Alcohol abuse Father   . Heart attack Maternal Grandmother        d. [phone]  .  Heart disease Maternal Grandmother   . Heart disease Maternal Grandfather        d. 60-63  . Leukemia Paternal Grandmother        d. late 59s  . Breast cancer Sister 21       IDC s/p mastectomy and chemo    Past Surgical History:  Procedure Laterality Date  . BREAST LUMPECTOMY Right 05/2016   radiation  . BREAST LUMPECTOMY WITH RADIOACTIVE SEED AND SENTINEL LYMPH NODE BIOPSY Right 05/28/2016   Procedure: RADIOACTIVE SEED GUIDED RIGHT BREAST LUMPECTOMY WITH  RIGHT AXILLARY SENTINEL LYMPH NODE BIOPSY;  Surgeon: [name], MD;  Location: [facility];  Service: General;  Laterality: Right;  . CARPAL TUNNEL RELEASE  03/26/2011   right hand  . CARPAL TUNNEL RELEASE  05/28/2011   Procedure: CARPAL TUNNEL RELEASE;  Surgeon: [name];  Location[facility];  Service: Orthopedics;  Laterality: Left;  . CHOLECYSTECTOMY N/A 01/24/2013   Procedure: LAPAROSCOPIC CHOLECYSTECTOMY WITH INTRAOPERATIVE CHOLANGIOGRAM;  Surgeon: [name], MD;  Location: [facility];  Service: General;  Laterality: N/A;  . COLONOSCOPY    . HYSTEROSCOPY    . POLYPECTOMY    . TONSILLECTOMY  1967   as child  . trigger fingers  8 yrs. ago   both done months apart  . uterine ablation  06/04/2010  . uterine ablation     Social History   Occupational History  . Not on file  Tobacco Use  . Smoking status: Never Smoker  . Smokeless tobacco: Never Used  Substance and Sexual Activity  . Alcohol use: No    Comment: none since 1996  . Drug use: No  . Sexual activity: Not Currently    Birth control/protection: None

## 2018-09-11 MED FILL — metFORMIN HCL 500 MG TABS: 500 | 30 days supply | Qty: 30 | Fill #0

## 2018-09-14 ENCOUNTER — Encounter (INDEPENDENT_AMBULATORY_CARE_PROVIDER_SITE_OTHER): Payer: Self-pay

## 2018-09-17 ENCOUNTER — Other Ambulatory Visit: Payer: Self-pay

## 2018-09-17 ENCOUNTER — Encounter (INDEPENDENT_AMBULATORY_CARE_PROVIDER_SITE_OTHER): Payer: Self-pay | Admitting: Physician Assistant

## 2018-09-17 ENCOUNTER — Ambulatory Visit (INDEPENDENT_AMBULATORY_CARE_PROVIDER_SITE_OTHER): Payer: 59 | Admitting: Physician Assistant

## 2018-09-17 VITALS — BP 134/81 | HR 82 | Temp 98.3°F | Ht 67.0 in | Wt 240.0 lb

## 2018-09-17 DIAGNOSIS — Z6837 Body mass index (BMI) 37.0-37.9, adult: Secondary | ICD-10-CM | POA: Diagnosis not present

## 2018-09-17 DIAGNOSIS — E559 Vitamin D deficiency, unspecified: Secondary | ICD-10-CM

## 2018-09-17 MED FILL — ROSUVASTATIN CALCIUM 10 MG: 10 | 90 days supply | Qty: 90 | Fill #0

## 2018-09-17 MED FILL — ANASTROZOLE 1 MG TABLET: 1 | 90 days supply | Qty: 90 | Fill #3

## 2018-09-17 MED FILL — buPROPion HCL ER (XL) 150 M: 150 | 90 days supply | Qty: 90 | Fill #1

## 2018-09-17 NOTE — Progress Notes (Signed)
Office: [phone]  /  Fax: [phone]   HPI:   Chief Complaint: OBESITY Candace [name] is here to discuss her progress with her obesity treatment plan. She is on the Category 4 plan and is following her eating plan approximately 60% of the time. She states she is exercising 0 minutes 0 times per week. Candace [name] reports that she has not been meal planning or prepping. She has also been baking with her daughter.  Her weight is 240 lb (108.9 kg) today and has not lost weight since her last visit. She has lost 9 lbs since starting treatment with [name].  Vitamin D deficiency Candace [name] has a diagnosis of Vitamin D deficiency. She is currently taking Vit D and denies nausea, vomiting or muscle weakness.  ASSESSMENT AND PLAN:  Vitamin D deficiency  Class 2 severe obesity with serious comorbidity and body mass index (BMI) of 37.0 to 37.9 in adult, unspecified obesity type ([place])  PLAN:  Vitamin D Deficiency Candace [name] was informed that low Vitamin D levels contributes to fatigue and are associated with obesity, breast, and colon cancer. She agrees to continue taking Vit D and will follow-up for routine testing of Vitamin D, at least 2-3 times per year. She was informed of the risk of over-replacement of Vitamin D and agrees to not increase her dose unless she discusses this with [name] first. Candace [name] agrees to follow-up with our clinic in 2-3 weeks.  I spent > than 50% of the 15 minute visit on counseling as documented in the note.  Obesity Candace [name] is currently in the action stage of change. As such, her goal is to continue with weight loss efforts. She has agreed to follow the Category 4 plan. Candace [name] has been instructed to work up to a goal of 150 minutes of combined cardio and strengthening exercise per week for weight loss and overall health benefits. We discussed the following Behavioral Modification Strategies today: work on meal planning, easy cooking plans, and keeping healthy foods in the home.  Candace [name]  has agreed to follow-up with our clinic in 2-3 weeks. She was informed of the importance of frequent follow-up visits to maximize her success with intensive lifestyle modifications for her multiple health conditions.  ALLERGIES: No Known Allergies  MEDICATIONS: Current Outpatient Medications on File Prior to Visit  Medication Sig Dispense Refill  . anastrozole (ARIMIDEX) 1 MG tablet TAKE 1 TABLET (1 MG TOTAL) BY MOUTH DAILY. 90 tablet 4  . buPROPion (WELLBUTRIN XL) 150 MG 24 hr tablet Take 150 mg by mouth daily.    [name] buPROPion (WELLBUTRIN XL) 300 MG 24 hr tablet Take 300 mg by mouth daily.     . cetirizine (ZYRTEC) 10 MG tablet Take 10 mg by mouth daily. [facility]    . Cholecalciferol (VITAMIN D-3) 125 MCG (5000 UT) TABS Take 5,000 Units by mouth daily. 30 tablet   . esomeprazole (NEXIUM) 40 MG capsule Take 40 mg by mouth every 36 (thirty-six) hours.    [name] FLUoxetine (PROZAC) 10 MG capsule Take 10 mg by mouth daily with lunch.     . metFORMIN (GLUCOPHAGE) 500 MG tablet Take 1 tablet (500 mg total) by mouth daily with breakfast. 30 tablet 0  . Multiple Vitamin (MULTIVITAMIN WITH MINERALS) TABS tablet Take 1 tablet by mouth daily.    [name] rOPINIRole (REQUIP) 0.25 MG tablet Take 0.5 mg by mouth daily at 8 pm.    . rosuvastatin (CRESTOR) 10 MG tablet Take 10 mg by mouth at bedtime.  No current facility-administered medications on file prior to visit.     PAST MEDICAL HISTORY: Past Medical History:  Diagnosis Date  . Abnormal Pap smear   . Alcohol abuse   . Anovulation   . Anxiety   . Breast cancer ([place]) 04/05/2016   right breast  . Cancer ([place]) 2017   right breast cancer  . Complication of anesthesia    slow to wake up  . Depression   . Endometrial hyperplasia   . Endometrial polyp    h/o  . Epidermal cyst    h/o  . Gallbladder problem   . GERD (gastroesophageal reflux disease)   . H/O hemorrhoids   . H/O menorrhagia   . High risk HPV infection   .  Hyperlipidemia   . Insomnia   . Obesity   . Oligomenorrhea   . Prediabetes   . Restless legs syndrome   . Seasonal allergies   . Vitamin D deficiency     PAST SURGICAL HISTORY: Past Surgical History:  Procedure Laterality Date  . BREAST LUMPECTOMY Right 05/2016   radiation  . BREAST LUMPECTOMY WITH RADIOACTIVE SEED AND SENTINEL LYMPH NODE BIOPSY Right 05/28/2016   Procedure: RADIOACTIVE SEED GUIDED RIGHT BREAST LUMPECTOMY WITH  RIGHT AXILLARY SENTINEL LYMPH NODE BIOPSY;  Surgeon: [name], MD;  Location: [facility];  Service: General;  Laterality: Right;  . CARPAL TUNNEL RELEASE  03/26/2011   right hand  . CARPAL TUNNEL RELEASE  05/28/2011   Procedure: CARPAL TUNNEL RELEASE;  Surgeon: [name];  Location: [facility];  Service: Orthopedics;  Laterality: Left;  . CHOLECYSTECTOMY N/A 01/24/2013   Procedure: LAPAROSCOPIC CHOLECYSTECTOMY WITH INTRAOPERATIVE CHOLANGIOGRAM;  Surgeon: [name], MD;  Location: [facility];  Service: General;  Laterality: N/A;  . COLONOSCOPY    . HYSTEROSCOPY    . POLYPECTOMY    . TONSILLECTOMY  1967   as child  . trigger fingers  8 yrs. ago   both done months apart  . uterine ablation  06/04/2010  . uterine ablation      SOCIAL HISTORY: Social History   Tobacco Use  . Smoking status: Never Smoker  . Smokeless tobacco: Never Used  Substance Use Topics  . Alcohol use: No    Comment: none since 1996  . Drug use: No    FAMILY HISTORY: Family History  Problem Relation Age of Onset  . Heart disease Mother   . Hypertension Mother   . Stroke Mother   . Dementia Mother        vascular dementia  . Hyperlipidemia Mother   . Depression Mother   . Anxiety disorder Mother   . Alcohol abuse Mother   . Obesity Mother   . Pulmonary fibrosis Father        d. 71  . Depression Father   . Anxiety disorder Father   . Alcohol abuse Father   . Heart attack Maternal Grandmother        d. [phone]  . Heart disease Maternal Grandmother   .  Heart disease Maternal Grandfather        d. 60-63  . Leukemia Paternal Grandmother        d. late 91s  . Breast cancer Sister 78       IDC s/p mastectomy and chemo   ROS: Review of Systems  Constitutional: Negative for weight loss.  Gastrointestinal: Negative for nausea and vomiting.  Musculoskeletal:       Negative for muscle weakness.   PHYSICAL EXAM:  Blood pressure 134/81, pulse 82, temperature 98.3 F (36.8 C), height 5\' 7"  (1.702 m), weight 240 lb (108.9 kg), SpO2 95 %. Body mass index is 37.59 kg/m. Physical Exam Vitals signs reviewed.  Constitutional:      Appearance: Normal appearance. She is obese.  Cardiovascular:     Rate and Rhythm: Normal rate.     Pulses: Normal pulses.  Pulmonary:     Effort: Pulmonary effort is normal.     Breath sounds: Normal breath sounds.  Musculoskeletal: Normal range of motion.  Skin:    General: Skin is warm and dry.  Neurological:     Mental Status: She is alert and oriented to person, place, and time.  Psychiatric:        Behavior: Behavior normal.   RECENT LABS AND TESTS: BMET    Component Value Date/Time   NA 144 08/06/2018 0929   NA 141 04/16/2017 1409   K 4.5 08/06/2018 0929   K 3.8 04/16/2017 1409   CL 103 08/06/2018 0929   CO2 24 08/06/2018 0929   CO2 25 04/16/2017 1409   GLUCOSE 103 (H) 08/06/2018 0929   GLUCOSE 79 11/11/2017 1127   GLUCOSE 134 04/16/2017 1409   BUN 15 08/06/2018 0929   BUN 12.4 04/16/2017 1409   CREATININE 0.78 08/06/2018 0929   CREATININE 0.9 04/16/2017 1409   CALCIUM 10.0 08/06/2018 0929   CALCIUM 9.6 04/16/2017 1409   GFRNONAA 85 08/06/2018 0929   GFRAA 98 08/06/2018 0929   Lab Results  Component Value Date   HGBA1C 5.9 (H) 08/06/2018   HGBA1C 5.9 (H) 02/18/2018   HGBA1C 6.2 (H) 09/17/2017   Lab Results  Component Value Date   INSULIN 20.6 08/06/2018   INSULIN 15.2 02/18/2018   INSULIN 24.2 09/17/2017   CBC    Component Value Date/Time   WBC 6.8 11/11/2017 1127   RBC 5.02  11/11/2017 1127   HGB 14.2 11/11/2017 1127   HGB 13.7 04/16/2017 1409   HCT 42.2 11/11/2017 1127   HCT 41.2 04/16/2017 1409   PLT 248 11/11/2017 1127   PLT 259 04/16/2017 1409   MCV 84.1 11/11/2017 1127   MCV 84.4 04/16/2017 1409   MCH 28.2 11/11/2017 1127   MCHC 33.5 11/11/2017 1127   RDW 13.1 11/11/2017 1127   RDW 13.2 04/16/2017 1409   LYMPHSABS 1.7 11/11/2017 1127   LYMPHSABS 1.9 04/16/2017 1409   MONOABS 0.8 11/11/2017 1127   MONOABS 0.6 04/16/2017 1409   EOSABS 0.3 11/11/2017 1127   EOSABS 0.1 04/16/2017 1409   BASOSABS 0.0 11/11/2017 1127   BASOSABS 0.0 04/16/2017 1409   Iron/TIBC/Ferritin/ %Sat No results found for: IRON, TIBC, FERRITIN, IRONPCTSAT Lipid Panel     Component Value Date/Time   CHOL 160 08/06/2018 0929   TRIG 205 (H) 08/06/2018 0929   HDL 47 08/06/2018 0929   LDLCALC 72 08/06/2018 0929   Hepatic Function Panel     Component Value Date/Time   PROT 6.7 08/06/2018 0929   PROT 7.0 04/16/2017 1409   ALBUMIN 4.6 08/06/2018 0929   ALBUMIN 4.0 04/16/2017 1409   AST 15 08/06/2018 0929   AST 29 04/16/2017 1409   ALT 17 08/06/2018 0929   ALT 34 04/16/2017 1409   ALKPHOS 93 08/06/2018 0929   ALKPHOS 74 04/16/2017 1409   BILITOT 0.3 08/06/2018 0929   BILITOT 0.40 04/16/2017 1409      Component Value Date/Time   TSH 2.390 09/17/2017 1140   Results for ELLISE, KOVACK (MRN [id number]) as of  09/17/2018 14:25  Ref. Range 08/06/2018 09:29  Vitamin D, 25-Hydroxy Latest Ref Range: 30.0 - 100.0 ng/mL 49.5   OBESITY BEHAVIORAL INTERVENTION VISIT  Today's visit was #20  Starting weight: 249 lbs Starting date: 09/17/2017 Today's weight: 240 lbs Today's date: 09/17/2018 Total lbs lost to date: 9    09/17/2018  Height 5\' 7"  (1.702 m)  Weight 240 lb (108.9 kg)  BMI (Calculated) 37.58  BLOOD PRESSURE - SYSTOLIC 561  BLOOD PRESSURE - DIASTOLIC 81   Body Fat % 53.7 %  Total Body Water (lbs) 89.2 lbs   ASK: We discussed the diagnosis of obesity with Leland Johns today and [name] agreed to give [name] permission to discuss obesity behavioral modification therapy today.  ASSESS: Evonne has the diagnosis of obesity and her BMI today is 37.58. Shakeela is in the action stage of change.   ADVISE: Kemiya was educated on the multiple health risks of obesity as well as the benefit of weight loss to improve her health. She was advised of the need for long term treatment and the importance of lifestyle modifications to improve her current health and to decrease her risk of future health problems.  AGREE: Multiple dietary modification options and treatment options were discussed and  Nikya agreed to follow the recommendations documented in the above note.  ARRANGE: Lossie was educated on the importance of frequent visits to treat obesity as outlined per CMS and USPSTF guidelines and agreed to schedule her next follow up appointment today.  Migdalia Dk, am acting as transcriptionist for [name], PA-C I, [name], PA-C have reviewed above note and agree with its content

## 2018-09-18 MED FILL — ESOMEPRAZOLE MAG DR 40 MG C: 40 | 90 days supply | Qty: 90 | Fill #0

## 2018-09-22 ENCOUNTER — Encounter (INDEPENDENT_AMBULATORY_CARE_PROVIDER_SITE_OTHER): Payer: Self-pay

## 2018-10-01 ENCOUNTER — Encounter (INDEPENDENT_AMBULATORY_CARE_PROVIDER_SITE_OTHER): Payer: Self-pay | Admitting: Physician Assistant

## 2018-10-01 ENCOUNTER — Other Ambulatory Visit: Payer: Self-pay

## 2018-10-01 ENCOUNTER — Ambulatory Visit (INDEPENDENT_AMBULATORY_CARE_PROVIDER_SITE_OTHER): Payer: 59 | Admitting: Physician Assistant

## 2018-10-01 DIAGNOSIS — Z6837 Body mass index (BMI) 37.0-37.9, adult: Secondary | ICD-10-CM | POA: Diagnosis not present

## 2018-10-01 DIAGNOSIS — E7849 Other hyperlipidemia: Secondary | ICD-10-CM | POA: Diagnosis not present

## 2018-10-01 DIAGNOSIS — R7303 Prediabetes: Secondary | ICD-10-CM

## 2018-10-01 NOTE — Telephone Encounter (Signed)
FYI

## 2018-10-05 MED ORDER — METFORMIN HCL 500 MG PO TABS: 500.0000 mg | ORAL_TABLET | Freq: Every day | ORAL | 0 refills | Status: DC

## 2018-10-05 NOTE — Progress Notes (Signed)
Office: [phone]  /  Fax: [phone] TeleHealth Visit:  Candace [name] has verbally consented to this TeleHealth visit today. The patient is located at home, the provider is located at the [facility] office. The participants in this visit include the listed provider and patient. The visit was conducted today via FaceTime.  HPI:   Chief Complaint: OBESITY Candace [name] is here to discuss her progress with her obesity treatment plan. She is on the Category 4 plan and is following her eating plan approximately 60% of the time. She states she is exercising via exercise DVD 30 minutes 2 times per week. Atley reports that she is getting bored with the plan and wants to try new recipes to make things more interesting. We were unable to weigh the patient today for this TeleHealth visit. She feels as if she has maintained her weight since her last visit. She has lost 9 lbs since starting treatment with [name].  Pre-Diabetes Candace [name] has a diagnosis of prediabetes based on her elevated Hgb A1c and was informed this puts her at greater risk of developing diabetes. She is taking metformin currently and continues to work on diet and exercise to decrease risk of diabetes. She denies nausea, vomiting, diarrhea, or polyphagia.  Hyperlipidemia Candace [name] has hyperlipidemia and has been trying to improve her cholesterol levels with intensive lifestyle modification including a low saturated fat diet, exercise and weight loss. She is currently taking Crestor and denies any chest pain.  ASSESSMENT AND PLAN:  Prediabetes - Plan: metFORMIN (GLUCOPHAGE) 500 MG tablet  Other hyperlipidemia  Class 2 severe obesity with serious comorbidity and body mass index (BMI) of 37.0 to 37.9 in adult, unspecified obesity type ([place])  PLAN:  Pre-Diabetes Candace [name] will continue to work on weight loss, exercise, and decreasing simple carbohydrates in her diet to help decrease the risk of diabetes. We dicussed metformin  including benefits and risks. She was informed that eating too many simple carbohydrates or too many calories at one sitting increases the likelihood of GI side effects. Willo is currently taking metformin and a refill prescription was written today for #30 with 0 refills. Ziya agrees to follow-up with our clinic in 2-3 weeks.  Hyperlipidemia Candace [name] was informed of the American Heart Association Guidelines emphasizing intensive lifestyle modifications as the first line treatment for hyperlipidemia. We discussed many lifestyle modifications today in depth, and Candace [name] will continue to work on decreasing saturated fats such as fatty red meat, butter and many fried foods. She will also increase vegetables and lean protein in her diet and continue to work on exercise and weight loss efforts.  Obesity Candace [name] is currently in the action stage of change. As such, her goal is to continue with weight loss efforts. She has agreed to keep a food journal with 1700-1800 calories and 100 grams of protein daily. Candace [name] has been instructed to work up to a goal of 150 minutes of combined cardio and strengthening exercise per week for weight loss and overall health benefits. We discussed the following Behavioral Modification Strategies today: work on meal planning, easy cooking plans, and keeping healthy foods in the home.  Candace [name] has agreed to follow-up with our clinic in 2-3 weeks. She was informed of the importance of frequent follow-up visits to maximize her success with intensive lifestyle modifications for her multiple health conditions.  ALLERGIES: No Known Allergies  MEDICATIONS: Current Outpatient Medications on File Prior to Visit  Medication Sig Dispense Refill  . anastrozole (ARIMIDEX) 1 MG tablet TAKE  1 TABLET (1 MG TOTAL) BY MOUTH DAILY. 90 tablet 4  . buPROPion (WELLBUTRIN XL) 150 MG 24 hr tablet Take 150 mg by mouth daily.    [name] buPROPion (WELLBUTRIN XL) 300 MG 24 hr tablet Take 300 mg by mouth  daily.     . cetirizine (ZYRTEC) 10 MG tablet Take 10 mg by mouth daily. [facility]    . Cholecalciferol (VITAMIN D-3) 125 MCG (5000 UT) TABS Take 5,000 Units by mouth daily. 30 tablet   . esomeprazole (NEXIUM) 40 MG capsule Take 40 mg by mouth every 36 (thirty-six) hours.    [name] FLUoxetine (PROZAC) 10 MG capsule Take 10 mg by mouth daily with lunch.     . Multiple Vitamin (MULTIVITAMIN WITH MINERALS) TABS tablet Take 1 tablet by mouth daily.    [name] rOPINIRole (REQUIP) 0.25 MG tablet Take 0.5 mg by mouth daily at 8 pm.    . rosuvastatin (CRESTOR) 10 MG tablet Take 10 mg by mouth at bedtime.     No current facility-administered medications on file prior to visit.     PAST MEDICAL HISTORY: Past Medical History:  Diagnosis Date  . Abnormal Pap smear   . Alcohol abuse   . Anovulation   . Anxiety   . Breast cancer ([place]) 04/05/2016   right breast  . Cancer ([place]) 2017   right breast cancer  . Complication of anesthesia    slow to wake up  . Depression   . Endometrial hyperplasia   . Endometrial polyp    h/o  . Epidermal cyst    h/o  . Gallbladder problem   . GERD (gastroesophageal reflux disease)   . H/O hemorrhoids   . H/O menorrhagia   . High risk HPV infection   . Hyperlipidemia   . Insomnia   . Obesity   . Oligomenorrhea   . Prediabetes   . Restless legs syndrome   . Seasonal allergies   . Vitamin D deficiency     PAST SURGICAL HISTORY: Past Surgical History:  Procedure Laterality Date  . BREAST LUMPECTOMY Right 05/2016   radiation  . BREAST LUMPECTOMY WITH RADIOACTIVE SEED AND SENTINEL LYMPH NODE BIOPSY Right 05/28/2016   Procedure: RADIOACTIVE SEED GUIDED RIGHT BREAST LUMPECTOMY WITH  RIGHT AXILLARY SENTINEL LYMPH NODE BIOPSY;  Surgeon: [name], MD;  Location: [facility];  Service: General;  Laterality: Right;  . CARPAL TUNNEL RELEASE  03/26/2011   right hand  . CARPAL TUNNEL RELEASE  05/28/2011   Procedure: CARPAL TUNNEL RELEASE;  Surgeon:  [name];  Location[facility];  Service: Orthopedics;  Laterality: Left;  . CHOLECYSTECTOMY N/A 01/24/2013   Procedure: LAPAROSCOPIC CHOLECYSTECTOMY WITH INTRAOPERATIVE CHOLANGIOGRAM;  Surgeon: [name], MD;  Location: [facility];  Service: General;  Laterality: N/A;  . COLONOSCOPY    . HYSTEROSCOPY    . POLYPECTOMY    . TONSILLECTOMY  1967   as child  . trigger fingers  8 yrs. ago   both done months apart  . uterine ablation  06/04/2010  . uterine ablation      SOCIAL HISTORY: Social History   Tobacco Use  . Smoking status: Never Smoker  . Smokeless tobacco: Never Used  Substance Use Topics  . Alcohol use: No    Comment: none since 1996  . Drug use: No    FAMILY HISTORY: Family History  Problem Relation Age of Onset  . Heart disease Mother   . Hypertension Mother   . Stroke Mother   .  Dementia Mother        vascular dementia  . Hyperlipidemia Mother   . Depression Mother   . Anxiety disorder Mother   . Alcohol abuse Mother   . Obesity Mother   . Pulmonary fibrosis Father        d. 51  . Depression Father   . Anxiety disorder Father   . Alcohol abuse Father   . Heart attack Maternal Grandmother        d. [phone]  . Heart disease Maternal Grandmother   . Heart disease Maternal Grandfather        d. 60-63  . Leukemia Paternal Grandmother        d. late 7s  . Breast cancer Sister 61       IDC s/p mastectomy and chemo   ROS: Review of Systems  Cardiovascular: Negative for chest pain.  Gastrointestinal: Negative for diarrhea, nausea and vomiting.  Endo/Heme/Allergies:       Negative for polyphagia.   PHYSICAL EXAM: Pt in no acute distress  RECENT LABS AND TESTS: BMET    Component Value Date/Time   NA 144 08/06/2018 0929   NA 141 04/16/2017 1409   K 4.5 08/06/2018 0929   K 3.8 04/16/2017 1409   CL 103 08/06/2018 0929   CO2 24 08/06/2018 0929   CO2 25 04/16/2017 1409   GLUCOSE 103 (H) 08/06/2018 0929   GLUCOSE 79 11/11/2017 1127    GLUCOSE 134 04/16/2017 1409   BUN 15 08/06/2018 0929   BUN 12.4 04/16/2017 1409   CREATININE 0.78 08/06/2018 0929   CREATININE 0.9 04/16/2017 1409   CALCIUM 10.0 08/06/2018 0929   CALCIUM 9.6 04/16/2017 1409   GFRNONAA 85 08/06/2018 0929   GFRAA 98 08/06/2018 0929   Lab Results  Component Value Date   HGBA1C 5.9 (H) 08/06/2018   HGBA1C 5.9 (H) 02/18/2018   HGBA1C 6.2 (H) 09/17/2017   Lab Results  Component Value Date   INSULIN 20.6 08/06/2018   INSULIN 15.2 02/18/2018   INSULIN 24.2 09/17/2017   CBC    Component Value Date/Time   WBC 6.8 11/11/2017 1127   RBC 5.02 11/11/2017 1127   HGB 14.2 11/11/2017 1127   HGB 13.7 04/16/2017 1409   HCT 42.2 11/11/2017 1127   HCT 41.2 04/16/2017 1409   PLT 248 11/11/2017 1127   PLT 259 04/16/2017 1409   MCV 84.1 11/11/2017 1127   MCV 84.4 04/16/2017 1409   MCH 28.2 11/11/2017 1127   MCHC 33.5 11/11/2017 1127   RDW 13.1 11/11/2017 1127   RDW 13.2 04/16/2017 1409   LYMPHSABS 1.7 11/11/2017 1127   LYMPHSABS 1.9 04/16/2017 1409   MONOABS 0.8 11/11/2017 1127   MONOABS 0.6 04/16/2017 1409   EOSABS 0.3 11/11/2017 1127   EOSABS 0.1 04/16/2017 1409   BASOSABS 0.0 11/11/2017 1127   BASOSABS 0.0 04/16/2017 1409   Iron/TIBC/Ferritin/ %Sat No results found for: IRON, TIBC, FERRITIN, IRONPCTSAT Lipid Panel     Component Value Date/Time   CHOL 160 08/06/2018 0929   TRIG 205 (H) 08/06/2018 0929   HDL 47 08/06/2018 0929   LDLCALC 72 08/06/2018 0929   Hepatic Function Panel     Component Value Date/Time   PROT 6.7 08/06/2018 0929   PROT 7.0 04/16/2017 1409   ALBUMIN 4.6 08/06/2018 0929   ALBUMIN 4.0 04/16/2017 1409   AST 15 08/06/2018 0929   AST 29 04/16/2017 1409   ALT 17 08/06/2018 0929   ALT 34 04/16/2017 1409   ALKPHOS 93  08/06/2018 0929   ALKPHOS 74 04/16/2017 1409   BILITOT 0.3 08/06/2018 0929   BILITOT 0.40 04/16/2017 1409      Component Value Date/Time   TSH 2.390 09/17/2017 1140   Results for CIARRAH, RAE (MRN  [id number]) as of 10/05/2018 09:24  Ref. Range 08/06/2018 09:29  Vitamin D, 25-Hydroxy Latest Ref Range: 30.0 - 100.0 ng/mL 49.5    I, Michaelene Song, am acting as Location manager for [employer], PA-C I, [name], PA-C have reviewed above note and agree with its content

## 2018-10-06 MED FILL — metFORMIN HCL 500 MG TABS: 500 | 30 days supply | Qty: 30 | Fill #0

## 2018-10-06 MED FILL — rOPINIRole HCL 0.5 MG TABS: 0.5 | 90 days supply | Qty: 90 | Fill #0

## 2018-10-22 ENCOUNTER — Ambulatory Visit (INDEPENDENT_AMBULATORY_CARE_PROVIDER_SITE_OTHER): Payer: 59 | Admitting: Physician Assistant

## 2018-10-22 ENCOUNTER — Encounter (INDEPENDENT_AMBULATORY_CARE_PROVIDER_SITE_OTHER): Payer: Self-pay | Admitting: Physician Assistant

## 2018-10-22 ENCOUNTER — Other Ambulatory Visit: Payer: Self-pay

## 2018-10-22 DIAGNOSIS — Z6837 Body mass index (BMI) 37.0-37.9, adult: Secondary | ICD-10-CM

## 2018-10-22 DIAGNOSIS — R7303 Prediabetes: Secondary | ICD-10-CM

## 2018-10-22 DIAGNOSIS — E559 Vitamin D deficiency, unspecified: Secondary | ICD-10-CM

## 2018-10-22 MED ORDER — METFORMIN HCL 500 MG PO TABS: 500.0000 mg | ORAL_TABLET | Freq: Every day | ORAL | 0 refills | Status: DC

## 2018-10-22 NOTE — Progress Notes (Signed)
Office: [phone]  /  Fax: [phone] TeleHealth Visit:  Candace [name] has verbally consented to this TeleHealth visit today. The patient is located at home, the provider is located at the [employer] [facility] office. The participants in this visit include the listed provider and patient. The visit was conducted today via Webex.  HPI:   Chief Complaint: OBESITY Candace [name] is here to discuss her progress with her obesity treatment plan. She is keeping a food journal with 1700-1800 calories and 100 grams of protein daily and is following her eating plan approximately 60% of the time. She states she is exercising 0 minutes 0 times per week. Candace [name] reports that she has been following the plan for breakfast and lunch but is having a hard time with dinner. She is trying new recipes but is unsure about the protein or calories in them.  We were unable to weigh the patient today for this TeleHealth visit. She feels as if she has gained 2-3 lbs since her last visit. She has lost 9 lbs since starting treatment with [name].  Pre-Diabetes Candace [name] has a diagnosis of prediabetes based on her elevated Hgb A1c and was informed this puts her at greater risk of developing diabetes. She is taking metformin currently and continues to work on diet and exercise to decrease risk of diabetes. She denies nausea, vomiting, or diarrhea on metformin. No polyphagia.  Vitamin D deficiency Candace [name] has a diagnosis of Vitamin D deficiency. She is currently taking OTC Vit D and denies nausea, vomiting or muscle weakness.  ASSESSMENT AND PLAN:  Prediabetes - Plan: metFORMIN (GLUCOPHAGE) 500 MG tablet  Vitamin D deficiency  Class 2 severe obesity with serious comorbidity and body mass index (BMI) of 37.0 to 37.9 in adult, unspecified obesity type ([place])  PLAN:  Pre-Diabetes Candace [name] will continue to work on weight loss, exercise, and decreasing simple carbohydrates in her diet to help decrease the risk of diabetes. We  dicussed metformin including benefits and risks. She was informed that eating too many simple carbohydrates or too many calories at one sitting increases the likelihood of GI side effects. Candace [name] is currently taking metformin and a refill prescription was written today for #30 with 0 refills.Candace [name] agrees to follow-up with our clinic in 3 weeks.   Vitamin D Deficiency Candace [name] was informed that low Vitamin D levels contributes to fatigue and are associated with obesity, breast, and colon cancer. She agrees to continue taking OTC Vit D and will follow-up for routine testing of Vitamin D, at least 2-3 times per year. She was informed of the risk of over-replacement of Vitamin D and agrees to not increase her dose unless she discusses this with [name] first. Candace [name] agrees to follow-up with our clinic in 3 weeks.  Obesity Candace [name] is currently in the action stage of change. As such, her goal is to continue with weight loss efforts. She has agreed to follow the Category 4 plan or journal 1700-1800 calories. Candace [name] has been instructed to work up to a goal of 150 minutes of combined cardio and strengthening exercise per week for weight loss and overall health benefits. We discussed the following Behavioral Modification Strategies today: no skipping meals, work on meal planning and easy cooking plans  Candace [name] has agreed to follow-up with our clinic in 3 weeks. She was informed of the importance of frequent follow-up visits to maximize her success with intensive lifestyle modifications for her multiple health conditions.  ALLERGIES: No Known Allergies  MEDICATIONS: Current Outpatient Medications  on File Prior to Visit  Medication Sig Dispense Refill  . anastrozole (ARIMIDEX) 1 MG tablet TAKE 1 TABLET (1 MG TOTAL) BY MOUTH DAILY. 90 tablet 4  . buPROPion (WELLBUTRIN XL) 150 MG 24 hr tablet Take 150 mg by mouth daily.    Candace [name] buPROPion (WELLBUTRIN XL) 300 MG 24 hr tablet Take 300 mg by mouth daily.     .  cetirizine (ZYRTEC) 10 MG tablet Take 10 mg by mouth daily. [facility]    . Cholecalciferol (VITAMIN D-3) 125 MCG (5000 UT) TABS Take 5,000 Units by mouth daily. 30 tablet   . esomeprazole (NEXIUM) 40 MG capsule Take 40 mg by mouth every 36 (thirty-six) hours.    Candace [name] FLUoxetine (PROZAC) 10 MG capsule Take 10 mg by mouth daily with lunch.     . Multiple Vitamin (MULTIVITAMIN WITH MINERALS) TABS tablet Take 1 tablet by mouth daily.    Candace [name] rOPINIRole (REQUIP) 0.25 MG tablet Take 0.5 mg by mouth daily at 8 pm.    . rosuvastatin (CRESTOR) 10 MG tablet Take 10 mg by mouth at bedtime.     No current facility-administered medications on file prior to visit.     PAST MEDICAL HISTORY: Past Medical History:  Diagnosis Date  . Abnormal Pap smear   . Alcohol abuse   . Anovulation   . Anxiety   . Breast cancer ([place]) 04/05/2016   right breast  . Cancer ([place]) 2017   right breast cancer  . Complication of anesthesia    slow to wake up  . Depression   . Endometrial hyperplasia   . Endometrial polyp    h/o  . Epidermal cyst    h/o  . Gallbladder problem   . GERD (gastroesophageal reflux disease)   . H/O hemorrhoids   . H/O menorrhagia   . High risk HPV infection   . Hyperlipidemia   . Insomnia   . Obesity   . Oligomenorrhea   . Prediabetes   . Restless legs syndrome   . Seasonal allergies   . Vitamin D deficiency     PAST SURGICAL HISTORY: Past Surgical History:  Procedure Laterality Date  . BREAST LUMPECTOMY Right 05/2016   radiation  . BREAST LUMPECTOMY WITH RADIOACTIVE SEED AND SENTINEL LYMPH NODE BIOPSY Right 05/28/2016   Procedure: RADIOACTIVE SEED GUIDED RIGHT BREAST LUMPECTOMY WITH  RIGHT AXILLARY SENTINEL LYMPH NODE BIOPSY;  Surgeon: [name], MD;  Location: [facility];  Service: General;  Laterality: Right;  . CARPAL TUNNEL RELEASE  03/26/2011   right hand  . CARPAL TUNNEL RELEASE  05/28/2011   Procedure: CARPAL TUNNEL RELEASE;  Surgeon: [name];  Location: [facility];  Service: Orthopedics;  Laterality: Left;  . CHOLECYSTECTOMY N/A 01/24/2013   Procedure: LAPAROSCOPIC CHOLECYSTECTOMY WITH INTRAOPERATIVE CHOLANGIOGRAM;  Surgeon: [name], MD;  Location: [facility];  Service: General;  Laterality: N/A;  . COLONOSCOPY    . HYSTEROSCOPY    . POLYPECTOMY    . TONSILLECTOMY  1967   as child  . trigger fingers  8 yrs. ago   both done months apart  . uterine ablation  06/04/2010  . uterine ablation      SOCIAL HISTORY: Social History   Tobacco Use  . Smoking status: Never Smoker  . Smokeless tobacco: Never Used  Substance Use Topics  . Alcohol use: No    Comment: none since 1996  . Drug use: No    FAMILY HISTORY: Family History  Problem Relation Age of  Onset  . Heart disease Mother   . Hypertension Mother   . Stroke Mother   . Dementia Mother        vascular dementia  . Hyperlipidemia Mother   . Depression Mother   . Anxiety disorder Mother   . Alcohol abuse Mother   . Obesity Mother   . Pulmonary fibrosis Father        d. 31  . Depression Father   . Anxiety disorder Father   . Alcohol abuse Father   . Heart attack Maternal Grandmother        d. [phone]  . Heart disease Maternal Grandmother   . Heart disease Maternal Grandfather        d. 60-63  . Leukemia Paternal Grandmother        d. late 64s  . Breast cancer Sister 45       IDC s/p mastectomy and chemo   ROS: Review of Systems  Gastrointestinal: Negative for diarrhea, nausea and vomiting.  Musculoskeletal:       Negative for muscle weakness.  Endo/Heme/Allergies:       Negative for polyphagia.   PHYSICAL EXAM: Pt in no acute distress  RECENT LABS AND TESTS: BMET    Component Value Date/Time   NA 144 08/06/2018 0929   NA 141 04/16/2017 1409   K 4.5 08/06/2018 0929   K 3.8 04/16/2017 1409   CL 103 08/06/2018 0929   CO2 24 08/06/2018 0929   CO2 25 04/16/2017 1409   GLUCOSE 103 (H) 08/06/2018 0929   GLUCOSE 79 11/11/2017 1127    GLUCOSE 134 04/16/2017 1409   BUN 15 08/06/2018 0929   BUN 12.4 04/16/2017 1409   CREATININE 0.78 08/06/2018 0929   CREATININE 0.9 04/16/2017 1409   CALCIUM 10.0 08/06/2018 0929   CALCIUM 9.6 04/16/2017 1409   GFRNONAA 85 08/06/2018 0929   GFRAA 98 08/06/2018 0929   Lab Results  Component Value Date   HGBA1C 5.9 (H) 08/06/2018   HGBA1C 5.9 (H) 02/18/2018   HGBA1C 6.2 (H) 09/17/2017   Lab Results  Component Value Date   INSULIN 20.6 08/06/2018   INSULIN 15.2 02/18/2018   INSULIN 24.2 09/17/2017   CBC    Component Value Date/Time   WBC 6.8 11/11/2017 1127   RBC 5.02 11/11/2017 1127   HGB 14.2 11/11/2017 1127   HGB 13.7 04/16/2017 1409   HCT 42.2 11/11/2017 1127   HCT 41.2 04/16/2017 1409   PLT 248 11/11/2017 1127   PLT 259 04/16/2017 1409   MCV 84.1 11/11/2017 1127   MCV 84.4 04/16/2017 1409   MCH 28.2 11/11/2017 1127   MCHC 33.5 11/11/2017 1127   RDW 13.1 11/11/2017 1127   RDW 13.2 04/16/2017 1409   LYMPHSABS 1.7 11/11/2017 1127   LYMPHSABS 1.9 04/16/2017 1409   MONOABS 0.8 11/11/2017 1127   MONOABS 0.6 04/16/2017 1409   EOSABS 0.3 11/11/2017 1127   EOSABS 0.1 04/16/2017 1409   BASOSABS 0.0 11/11/2017 1127   BASOSABS 0.0 04/16/2017 1409   Iron/TIBC/Ferritin/ %Sat No results found for: IRON, TIBC, FERRITIN, IRONPCTSAT Lipid Panel     Component Value Date/Time   CHOL 160 08/06/2018 0929   TRIG 205 (H) 08/06/2018 0929   HDL 47 08/06/2018 0929   LDLCALC 72 08/06/2018 0929   Hepatic Function Panel     Component Value Date/Time   PROT 6.7 08/06/2018 0929   PROT 7.0 04/16/2017 1409   ALBUMIN 4.6 08/06/2018 0929   ALBUMIN 4.0 04/16/2017 1409   AST  15 08/06/2018 0929   AST 29 04/16/2017 1409   ALT 17 08/06/2018 0929   ALT 34 04/16/2017 1409   ALKPHOS 93 08/06/2018 0929   ALKPHOS 74 04/16/2017 1409   BILITOT 0.3 08/06/2018 0929   BILITOT 0.40 04/16/2017 1409      Component Value Date/Time   TSH 2.390 09/17/2017 1140   Results for JASHIYA, BASSETT (MRN  [id number]) as of 10/22/2018 14:33  Ref. Range 08/06/2018 09:29  Vitamin D, 25-Hydroxy Latest Ref Range: 30.0 - 100.0 ng/mL 49.5   I, Michaelene Song, am acting as Location manager for [employer], PA-C I, [name], PA-C have reviewed above note and agree with its content

## 2018-10-30 DIAGNOSIS — F3341 Major depressive disorder, recurrent, in partial remission: Secondary | ICD-10-CM | POA: Diagnosis not present

## 2018-10-30 DIAGNOSIS — G2581 Restless legs syndrome: Secondary | ICD-10-CM | POA: Diagnosis not present

## 2018-10-30 DIAGNOSIS — E785 Hyperlipidemia, unspecified: Secondary | ICD-10-CM | POA: Diagnosis not present

## 2018-10-30 DIAGNOSIS — F419 Anxiety disorder, unspecified: Secondary | ICD-10-CM | POA: Diagnosis not present

## 2018-10-30 DIAGNOSIS — K219 Gastro-esophageal reflux disease without esophagitis: Secondary | ICD-10-CM | POA: Diagnosis not present

## 2018-10-30 MED FILL — FLUoxetine HCL 10 MG CAPS: 10 | 90 days supply | Qty: 90 | Fill #0

## 2018-11-10 ENCOUNTER — Other Ambulatory Visit: Payer: Self-pay

## 2018-11-10 ENCOUNTER — Telehealth: Payer: Self-pay | Admitting: Oncology

## 2018-11-10 ENCOUNTER — Encounter (INDEPENDENT_AMBULATORY_CARE_PROVIDER_SITE_OTHER): Payer: Self-pay | Admitting: Physician Assistant

## 2018-11-10 ENCOUNTER — Ambulatory Visit (INDEPENDENT_AMBULATORY_CARE_PROVIDER_SITE_OTHER): Payer: 59 | Admitting: Physician Assistant

## 2018-11-10 DIAGNOSIS — R7303 Prediabetes: Secondary | ICD-10-CM

## 2018-11-10 DIAGNOSIS — Z6837 Body mass index (BMI) 37.0-37.9, adult: Secondary | ICD-10-CM | POA: Diagnosis not present

## 2018-11-10 NOTE — Telephone Encounter (Signed)
Left vm for pt to call back re 11/12/18 office visit to try and convert into Webex mtg.

## 2018-11-10 NOTE — Progress Notes (Signed)
Office: [phone]  /  Fax: [phone] TeleHealth Visit:  Candace [name] has verbally consented to this TeleHealth visit today. The patient is located at home, the provider is located at the [employer] [facility] office. The participants in this visit include the listed provider and patient. The visit was conducted today via FaceTime.  HPI:   Chief Complaint: OBESITY Candace [name] is here to discuss her progress with her obesity treatment plan. She is on the Category 4 plan and is following her eating plan approximately 60% of the time. She states she is exercising 0 minutes 0 times per week. Candace [name] reports that she believes she has gained weight. There are a lot of junk foods at work and she has had a hard time resisting. She is ready to get back on track. We were unable to weigh the patient today for this TeleHealth visit. She feels as if she has gained weight since her last visit. She has lost 9 lbs since starting treatment with [name].  Pre-Diabetes Candace [name] has a diagnosis of prediabetes based on her elevated Hgb A1c and was informed this puts her at greater risk of developing diabetes. She is taking metformin currently and continues to work on diet and exercise to decrease risk of diabetes. She denies nausea, vomiting, or diarrhea. No polyphagia.  ASSESSMENT AND PLAN:  Prediabetes  Class 2 severe obesity with serious comorbidity and body mass index (BMI) of 37.0 to 37.9 in adult, unspecified obesity type [facility])  PLAN:  Pre-Diabetes Candace [name] will continue to work on weight loss, exercise, and decreasing simple carbohydrates in her diet to help decrease the risk of diabetes. We dicussed metformin including benefits and risks. She was informed that eating too many simple carbohydrates or too many calories at one sitting increases the likelihood of GI side effects. Candace [name] will continue metformin, weight loss, and agrees to follow-up with [name] as directed to monitor her progress.  Obesity Candace [name]  is currently in the action stage of change. As such, her goal is to continue with weight loss efforts. She has agreed to follow the Category 4 plan. Candace [name] has been instructed to work up to a goal of 150 minutes of combined cardio and strengthening exercise per week for weight loss and overall health benefits. We discussed the following Behavioral Modification Strategies today: work on meal planning, easy cooking plans, and keeping healthy foods in the home.  Candace [name] has agreed to follow-up with our clinic in 2 weeks. She was informed of the importance of frequent follow-up visits to maximize her success with intensive lifestyle modifications for her multiple health conditions.  ALLERGIES: No Known Allergies  MEDICATIONS: Current Outpatient Medications on File Prior to Visit  Medication Sig Dispense Refill  . anastrozole (ARIMIDEX) 1 MG tablet TAKE 1 TABLET (1 MG TOTAL) BY MOUTH DAILY. 90 tablet 4  . buPROPion (WELLBUTRIN XL) 150 MG 24 hr tablet Take 150 mg by mouth daily.    [name] buPROPion (WELLBUTRIN XL) 300 MG 24 hr tablet Take 300 mg by mouth daily.     . cetirizine (ZYRTEC) 10 MG tablet Take 10 mg by mouth daily. [facility]    . Cholecalciferol (VITAMIN D-3) 125 MCG (5000 UT) TABS Take 5,000 Units by mouth daily. 30 tablet   . esomeprazole (NEXIUM) 40 MG capsule Take 40 mg by mouth every 36 (thirty-six) hours.    [name] FLUoxetine (PROZAC) 10 MG capsule Take 10 mg by mouth daily with lunch.     . metFORMIN (GLUCOPHAGE) 500  MG tablet Take 1 tablet (500 mg total) by mouth daily with breakfast. 30 tablet 0  . Multiple Vitamin (MULTIVITAMIN WITH MINERALS) TABS tablet Take 1 tablet by mouth daily.    [name] rOPINIRole (REQUIP) 0.25 MG tablet Take 0.5 mg by mouth daily at 8 pm.    . rosuvastatin (CRESTOR) 10 MG tablet Take 10 mg by mouth at bedtime.     No current facility-administered medications on file prior to visit.     PAST MEDICAL HISTORY: Past Medical History:  Diagnosis  Date  . Abnormal Pap smear   . Alcohol abuse   . Anovulation   . Anxiety   . Breast cancer ([place]) 04/05/2016   right breast  . Cancer ([place]) 2017   right breast cancer  . Complication of anesthesia    slow to wake up  . Depression   . Endometrial hyperplasia   . Endometrial polyp    h/o  . Epidermal cyst    h/o  . Gallbladder problem   . GERD (gastroesophageal reflux disease)   . H/O hemorrhoids   . H/O menorrhagia   . High risk HPV infection   . Hyperlipidemia   . Insomnia   . Obesity   . Oligomenorrhea   . Prediabetes   . Restless legs syndrome   . Seasonal allergies   . Vitamin D deficiency     PAST SURGICAL HISTORY: Past Surgical History:  Procedure Laterality Date  . BREAST LUMPECTOMY Right 05/2016   radiation  . BREAST LUMPECTOMY WITH RADIOACTIVE SEED AND SENTINEL LYMPH NODE BIOPSY Right 05/28/2016   Procedure: RADIOACTIVE SEED GUIDED RIGHT BREAST LUMPECTOMY WITH  RIGHT AXILLARY SENTINEL LYMPH NODE BIOPSY;  Surgeon: [name], MD;  Location: [facility];  Service: General;  Laterality: Right;  . CARPAL TUNNEL RELEASE  03/26/2011   right hand  . CARPAL TUNNEL RELEASE  05/28/2011   Procedure: CARPAL TUNNEL RELEASE;  Surgeon: [name];  [facility];  Service: Orthopedics;  Laterality: Left;  . CHOLECYSTECTOMY N/A 01/24/2013   Procedure: LAPAROSCOPIC CHOLECYSTECTOMY WITH INTRAOPERATIVE CHOLANGIOGRAM;  Surgeon: [name], MD;  Location: [facility];  Service: General;  Laterality: N/A;  . COLONOSCOPY    . HYSTEROSCOPY    . POLYPECTOMY    . TONSILLECTOMY  1967   as child  . trigger fingers  8 yrs. ago   both done months apart  . uterine ablation  06/04/2010  . uterine ablation      SOCIAL HISTORY: Social History   Tobacco Use  . Smoking status: Never Smoker  . Smokeless tobacco: Never Used  Substance Use Topics  . Alcohol use: No    Comment: none since 1996  . Drug use: No    FAMILY HISTORY: Family History  Problem Relation Age  of Onset  . Heart disease Mother   . Hypertension Mother   . Stroke Mother   . Dementia Mother        vascular dementia  . Hyperlipidemia Mother   . Depression Mother   . Anxiety disorder Mother   . Alcohol abuse Mother   . Obesity Mother   . Pulmonary fibrosis Father        d. 7  . Depression Father   . Anxiety disorder Father   . Alcohol abuse Father   . Heart attack Maternal Grandmother        d. [phone]  . Heart disease Maternal Grandmother   . Heart disease Maternal Grandfather  d. 60-63  . Leukemia Paternal Grandmother        d. late 13s  . Breast cancer Sister 31       IDC s/p mastectomy and chemo   ROS: Review of Systems  Gastrointestinal: Negative for diarrhea, nausea and vomiting.  Endo/Heme/Allergies:       Negative for polyphagia.   PHYSICAL EXAM: Pt in no acute distress  RECENT LABS AND TESTS: BMET    Component Value Date/Time   NA 144 08/06/2018 0929   NA 141 04/16/2017 1409   K 4.5 08/06/2018 0929   K 3.8 04/16/2017 1409   CL 103 08/06/2018 0929   CO2 24 08/06/2018 0929   CO2 25 04/16/2017 1409   GLUCOSE 103 (H) 08/06/2018 0929   GLUCOSE 79 11/11/2017 1127   GLUCOSE 134 04/16/2017 1409   BUN 15 08/06/2018 0929   BUN 12.4 04/16/2017 1409   CREATININE 0.78 08/06/2018 0929   CREATININE 0.9 04/16/2017 1409   CALCIUM 10.0 08/06/2018 0929   CALCIUM 9.6 04/16/2017 1409   GFRNONAA 85 08/06/2018 0929   GFRAA 98 08/06/2018 0929   Lab Results  Component Value Date   HGBA1C 5.9 (H) 08/06/2018   HGBA1C 5.9 (H) 02/18/2018   HGBA1C 6.2 (H) 09/17/2017   Lab Results  Component Value Date   INSULIN 20.6 08/06/2018   INSULIN 15.2 02/18/2018   INSULIN 24.2 09/17/2017   CBC    Component Value Date/Time   WBC 6.8 11/11/2017 1127   RBC 5.02 11/11/2017 1127   HGB 14.2 11/11/2017 1127   HGB 13.7 04/16/2017 1409   HCT 42.2 11/11/2017 1127   HCT 41.2 04/16/2017 1409   PLT 248 11/11/2017 1127   PLT 259 04/16/2017 1409   MCV 84.1 11/11/2017  1127   MCV 84.4 04/16/2017 1409   MCH 28.2 11/11/2017 1127   MCHC 33.5 11/11/2017 1127   RDW 13.1 11/11/2017 1127   RDW 13.2 04/16/2017 1409   LYMPHSABS 1.7 11/11/2017 1127   LYMPHSABS 1.9 04/16/2017 1409   MONOABS 0.8 11/11/2017 1127   MONOABS 0.6 04/16/2017 1409   EOSABS 0.3 11/11/2017 1127   EOSABS 0.1 04/16/2017 1409   BASOSABS 0.0 11/11/2017 1127   BASOSABS 0.0 04/16/2017 1409   Iron/TIBC/Ferritin/ %Sat No results found for: IRON, TIBC, FERRITIN, IRONPCTSAT Lipid Panel     Component Value Date/Time   CHOL 160 08/06/2018 0929   TRIG 205 (H) 08/06/2018 0929   HDL 47 08/06/2018 0929   LDLCALC 72 08/06/2018 0929   Hepatic Function Panel     Component Value Date/Time   PROT 6.7 08/06/2018 0929   PROT 7.0 04/16/2017 1409   ALBUMIN 4.6 08/06/2018 0929   ALBUMIN 4.0 04/16/2017 1409   AST 15 08/06/2018 0929   AST 29 04/16/2017 1409   ALT 17 08/06/2018 0929   ALT 34 04/16/2017 1409   ALKPHOS 93 08/06/2018 0929   ALKPHOS 74 04/16/2017 1409   BILITOT 0.3 08/06/2018 0929   BILITOT 0.40 04/16/2017 1409      Component Value Date/Time   TSH 2.390 09/17/2017 1140   Results for BRIDGITTE, FELICETTI (MRN [id number]) as of 11/10/2018 12:19  Ref. Range 08/06/2018 09:29  Vitamin D, 25-Hydroxy Latest Ref Range: 30.0 - 100.0 ng/mL 49.5    I, Michaelene Song, am acting as Location manager for [employer], PA-C I, [name], PA-C have reviewed above note and agree with its content

## 2018-11-11 ENCOUNTER — Telehealth: Payer: Self-pay | Admitting: Oncology

## 2018-11-11 NOTE — Progress Notes (Signed)
Candace [name]  [phone] [phone]     ID: [name] DOB: [birth year]  MR#: [id number]  MWU#:[id number]  Patient Care Team: [name], MD as PCP - General (Family Medicine) Turki Tapanes, Virgie Dad, MD as Consulting Physician (Oncology) [name], MD as Consulting Physician (General Surgery) Leo Grosser Seymour Bars, MD as Consulting Physician (Obstetrics and Gynecology) Ronald Lobo, MD as Consulting Physician (Gastroenterology) Danella Sensing, MD as Consulting Physician (Dermatology) Delice Bison Charlestine Massed, NP as Nurse Practitioner (Hematology and Oncology) OTHER MD:  I connected with Leland Johns on 11/12/18 at 11:30 AM EDT by video enabled telemedicine visit and verified that I am speaking with the correct person using two identifiers.   I discussed the limitations, risks, security and privacy concerns of performing an evaluation and management service by telemedicine and the availability of in-person appointments. I also discussed with the patient that there may be a patient responsible charge related to this service. The patient expressed understanding and agreed to proceed.   Other persons participating in the visit and their role in the encounter: Wilburn Mylar, scribe   Patient's location: home  Provider's location: [place]    CHIEF COMPLAINT: Estrogen receptor positive breast cancer   CURRENT TREATMENT: Anastrozole   INTERVAL HISTORY: [name] returns today for follow-up and treatment of her estrogen receptor positive breast cancer.   She continues on anastrozole, with good tolerance. She denies hot flashes and vaginal dryness.  Since her last visit, she has not undergone any additional studies.  Her last mammogram was 04-04-2017.   REVIEW OF SYSTEMS: [name] reports doing well overall. She states she is continuing to work at the [employer]  center. She participated in the weight and wellness program  offered by Patrice Paradise and really benefited from this, but with everything going on she has fallen off the wagon she says. She states she is out of her routine.She walks her dogs for exercise.  The patient denies unusual headaches, visual changes, nausea, vomiting, stiff neck, dizziness, or gait imbalance. There has been no cough, phlegm production, or pleurisy, no chest pain or pressure, and no change in bowel or bladder habits. The patient denies fever, rash, bleeding, unexplained fatigue or unexplained weight loss. A detailed review of systems was otherwise entirely negative.   BREAST CANCER HISTORY: From the original intake note:  [name] had routine screening mammography with tomography at the [facility] 03/29/2016. On 04/04/2016 she underwent right diagnostic mammography and tomography with right breast ultrasonography. The breast density was category B. In the upper outer quadrant of the breast there was a mass measuring 0.7 cm, with irregular margins. By ultrasonography this was located at the 10:00 position 9 cm from the nipple measuring 0.6 cm. Right axillary ultrasound was negative.  Biopsy of the right breast mass in question 04/05/2016 showed ([id number]) and invasive lobular carcinoma, grade 2 or 3, E-cadherin negative, estrogen receptor 90% positive, progesterone receptor 5% positive, both with strong staining intensity, with an MIB-1 of 20%, and no HER-2 amplification, the signals ratio being 0.82 and the number per cell 1.60.  Bilateral breast MRI was obtained 04/23/2016. This confirmed a 0.6 cm nodule in the upper-outer quadrant of the right breast with no additional sites of concern in either breast and no abnormal appearing lymph nodes.  Her subsequent history is as detailed below.   PAST MEDICAL HISTORY: Past Medical History:  Diagnosis Date  . Abnormal Pap smear   . Alcohol abuse   . Anovulation   .  Anxiety   . Breast cancer ([facility]) 04/05/2016   right breast  . Cancer  ([facility]) 2017   right breast cancer  . Complication of anesthesia    slow to wake up  . Depression   . Endometrial hyperplasia   . Endometrial polyp    h/o  . Epidermal cyst    h/o  . Gallbladder problem   . GERD (gastroesophageal reflux disease)   . H/O hemorrhoids   . H/O menorrhagia   . High risk HPV infection   . Hyperlipidemia   . Insomnia   . Obesity   . Oligomenorrhea   . Prediabetes   . Restless legs syndrome   . Seasonal allergies   . Vitamin D deficiency     PAST SURGICAL HISTORY: Past Surgical History:  Procedure Laterality Date  . BREAST LUMPECTOMY Right 05/2016   radiation  . BREAST LUMPECTOMY WITH RADIOACTIVE SEED AND SENTINEL LYMPH NODE BIOPSY Right 05/28/2016   Procedure: RADIOACTIVE SEED GUIDED RIGHT BREAST LUMPECTOMY WITH  RIGHT AXILLARY SENTINEL LYMPH NODE BIOPSY;  Surgeon: [name], MD;  Location: [facility];  Service: General;  Laterality: Right;  . CARPAL TUNNEL RELEASE  03/26/2011   right hand  . CARPAL TUNNEL RELEASE  05/28/2011   Procedure: CARPAL TUNNEL RELEASE;  Surgeon: [name];  [facility];  Service: Orthopedics;  Laterality: Left;  . CHOLECYSTECTOMY N/A 01/24/2013   Procedure: LAPAROSCOPIC CHOLECYSTECTOMY WITH INTRAOPERATIVE CHOLANGIOGRAM;  Surgeon: [name], MD;  Location: [facility];  Service: General;  Laterality: N/A;  . COLONOSCOPY    . HYSTEROSCOPY    . POLYPECTOMY    . TONSILLECTOMY  1967   as child  . trigger fingers  8 yrs. ago   both done months apart  . uterine ablation  06/04/2010  . uterine ablation      FAMILY HISTORY Family History  Problem Relation Age of Onset  . Heart disease Mother   . Hypertension Mother   . Stroke Mother   . Dementia Mother        vascular dementia  . Hyperlipidemia Mother   . Depression Mother   . Anxiety disorder Mother   . Alcohol abuse Mother   . Obesity Mother   . Pulmonary fibrosis Father        d. 67  . Depression Father   . Anxiety disorder Father   .  Alcohol abuse Father   . Heart attack Maternal Grandmother        d. [phone]  . Heart disease Maternal Grandmother   . Heart disease Maternal Grandfather        d. 60-63  . Leukemia Paternal Grandmother        d. late 44s  . Breast cancer Sister 43       IDC s/p mastectomy and chemo  Both of the patient's parents were only children. Her father died at age 20 from idiopathic pulmonary fibrosis. The patient's mother died at the age of 19 in the setting of dementia. The patient has one brother and one sister. Her sister was diagnosed at age 45 with breast cancer (a T1b invasive ductal carcinoma just behind the nipple, requiring mastectomy. Her Oncotype was 24, and she received chemotherapy 3. She is now doing well).  GYNECOLOGIC HISTORY:  No LMP recorded. Patient has had an ablation. Menarche age 43, first live birth age ? She is GX P2. She stopped having periods December 2012 when she underwent endometrial ablation. She used hormone replacement for more than  10 years remotely, without complications  SOCIAL HISTORY: (Updated May 2020). 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 She is divorced and lives at home with her daughters  Her children 41, age 88, and [name], age 61, are doing well with online schoolwork. she had 2 dogs. The patient was brought up as an [place].    ADVANCED DIRECTIVES: In place. The patient's sister Halford Decamp is her healthcare part of attorney   HEALTH MAINTENANCE: Social History   Tobacco Use  . Smoking status: Never Smoker  . Smokeless tobacco: Never Used  Substance Use Topics  . Alcohol use: No    Comment: none since 1996  . Drug use: No     Colonoscopy:  PAP:  Bone density: 03/28/2017 showed a T score of 0.8 normal   No Known Allergies  Current Outpatient Medications  Medication Sig Dispense Refill  . anastrozole (ARIMIDEX) 1 MG tablet Take 1 tablet (1 mg total) by mouth daily. 90 tablet 4   . buPROPion (WELLBUTRIN XL) 150 MG 24 hr tablet Take 150 mg by mouth daily.    [name] buPROPion (WELLBUTRIN XL) 300 MG 24 hr tablet Take 300 mg by mouth daily.     . cetirizine (ZYRTEC) 10 MG tablet Take 10 mg by mouth daily. [facility]    . Cholecalciferol (VITAMIN D-3) 125 MCG (5000 UT) TABS Take 5,000 Units by mouth daily. 30 tablet   . esomeprazole (NEXIUM) 40 MG capsule Take 40 mg by mouth every 36 (thirty-six) hours.    [name] FLUoxetine (PROZAC) 10 MG capsule Take 10 mg by mouth daily with lunch.     . metFORMIN (GLUCOPHAGE) 500 MG tablet Take 1 tablet (500 mg total) by mouth daily with breakfast. 30 tablet 0  . Multiple Vitamin (MULTIVITAMIN WITH MINERALS) TABS tablet Take 1 tablet by mouth daily.    [name] rOPINIRole (REQUIP) 0.25 MG tablet Take 0.5 mg by mouth daily at 8 pm.    . rosuvastatin (CRESTOR) 10 MG tablet Take 10 mg by mouth at bedtime.     No current facility-administered medications for this visit.     OBJECTIVE:Middle-aged white woman in no acute distress  There were no vitals filed for this visit.   There is no height or weight on file to calculate BMI.    ECOG FS:0 - Asymptomatic     LAB RESULTS:  We have lab work from 08/06/2018 with the c-Met showing normal renal and hepatic function.  The vitamin D level on that date was 49.5.  Her LDL was 72, with an HDL of 47.  CMP     Component Value Date/Time   NA 144 08/06/2018 0929   NA 141 04/16/2017 1409   K 4.5 08/06/2018 0929   K 3.8 04/16/2017 1409   CL 103 08/06/2018 0929   CO2 24 08/06/2018 0929   CO2 25 04/16/2017 1409   GLUCOSE 103 (H) 08/06/2018 0929   GLUCOSE 79 11/11/2017 1127   GLUCOSE 134 04/16/2017 1409   BUN 15 08/06/2018 0929   BUN 12.4 04/16/2017 1409   CREATININE 0.78 08/06/2018 0929   CREATININE 0.9 04/16/2017 1409   CALCIUM 10.0 08/06/2018 0929   CALCIUM 9.6 04/16/2017 1409   PROT 6.7 08/06/2018 0929   PROT 7.0 04/16/2017 1409   ALBUMIN 4.6 08/06/2018 0929   ALBUMIN 4.0  04/16/2017 1409   AST 15 08/06/2018 0929   AST 29 04/16/2017 1409   ALT 17 08/06/2018 0929   ALT  34 04/16/2017 1409   ALKPHOS 93 08/06/2018 0929   ALKPHOS 74 04/16/2017 1409   BILITOT 0.3 08/06/2018 0929   BILITOT 0.40 04/16/2017 1409   GFRNONAA 85 08/06/2018 0929   GFRAA 98 08/06/2018 0929    INo results found for: SPEP, UPEP  Lab Results  Component Value Date   WBC 6.8 11/11/2017   NEUTROABS 3.9 11/11/2017   HGB 14.2 11/11/2017   HCT 42.2 11/11/2017   MCV 84.1 11/11/2017   PLT 248 11/11/2017      Chemistry      Component Value Date/Time   NA 144 08/06/2018 0929   NA 141 04/16/2017 1409   K 4.5 08/06/2018 0929   K 3.8 04/16/2017 1409   CL 103 08/06/2018 0929   CO2 24 08/06/2018 0929   CO2 25 04/16/2017 1409   BUN 15 08/06/2018 0929   BUN 12.4 04/16/2017 1409   CREATININE 0.78 08/06/2018 0929   CREATININE 0.9 04/16/2017 1409      Component Value Date/Time   CALCIUM 10.0 08/06/2018 0929   CALCIUM 9.6 04/16/2017 1409   ALKPHOS 93 08/06/2018 0929   ALKPHOS 74 04/16/2017 1409   AST 15 08/06/2018 0929   AST 29 04/16/2017 1409   ALT 17 08/06/2018 0929   ALT 34 04/16/2017 1409   BILITOT 0.3 08/06/2018 0929   BILITOT 0.40 04/16/2017 1409       No results found for: LABCA2  No components found for: LABCA125  No results for input(s): INR in the last 168 hours.  Urinalysis    Component Value Date/Time   COLORURINE YELLOW 01/24/2013 0327   APPEARANCEUR TURBID (A) 01/24/2013 0327   LABSPEC 1.021 01/24/2013 0327   PHURINE 7.5 01/24/2013 0327   GLUCOSEU NEGATIVE 01/24/2013 0327   HGBUR NEGATIVE 01/24/2013 0327   BILIRUBINUR NEGATIVE 01/24/2013 0327   KETONESUR 15 (A) 01/24/2013 0327   PROTEINUR NEGATIVE 01/24/2013 0327   UROBILINOGEN 0.2 01/24/2013 0327   NITRITE NEGATIVE 01/24/2013 0327   LEUKOCYTESUR NEGATIVE 01/24/2013 0327     STUDIES: No results found.   ELIGIBLE FOR AVAILABLE RESEARCH PROTOCOL: no  ASSESSMENT: 58 y.o. BRCA negative [place] woman status post right breast upper outer quadrant biopsy 04/05/2016 for a clinical T1b N0, stage IA invasive lobular breast cancer, grade 2 or 3, estrogen and progesterone receptor positive, HER-2 not amplified, with an MIB-1 of 20%  (1) genetics testing 04/23/2016 through the [facility] offered by [facility] Hope Pigeon, MD)  found no deleterious mutations in  ATM, BARD1, BRCA1, BRCA2, BRIP1, CDH1, CHEK2, FANCC, MLH1, MSH2, MSH6, NBN, PALB2, PMS2, PTEN, RAD51C, RAD51D, TP53, and XRCC2.  This panel also includes deletion/duplication analysis (without sequencing) for one gene, EPCAM  (2) status post right lumpectomy and sentinel lymph node sampling 05/28/2016 for a pT1b pN0, stage IA invasive lobular breast cancer, grade 2, with negative margins  (3) Oncotype Dx score of 22 predicts a 10 year risk of recurrence outside the breast of 14% if the patient's only systemic therapy is tamoxifen for 5 years. It predicts a benefit from chemotherapy in the 4% range.   (a) Patient opted against adjuvant chemotherapy   (4) adjuvant radiation 07/22/16-09/04/16 Site/dose:   1) Right breast/ 50.4 Gy in 28 fractions                         2) Right breast boost/ 10 Gy in 5 fractions  (5) started anastrozole September 29 2016   (a) bone density 03/28/2017  at the [facility] showed a T score of 0.8, normal   PLAN: Mecca is now 2-1/2 years out from definitive surgery for her breast cancer with no evidence of disease recurrence.  This is very favorable.  She is tolerating anastrozole well and the plan will be to continue that a minimum of 5 years, more likely 7, since we are dealing with a lobular breast cancer.  She is behind on her mammography and I have gone ahead and put the order in for her to have that later this month or early in June.  She is also due for a bone density scan in September and I have entered that order as well.  I will see her early October.  She knows  to call for any other issues that may develop before that visit.   Nuvia Hileman, Virgie Dad, MD  11/12/18 11:43 AM Medical Oncology and Hematology [facility] [address] [place], [place] [address] Tel. [phone]    Fax. [phone]   I, Wilburn Mylar, am acting as scribe for Dr. Virgie Dad. Glender [place].  I, Lurline Del MD, have reviewed the above documentation for accuracy and completeness, and I agree with the above.

## 2018-11-11 NOTE — Telephone Encounter (Signed)
Called patient regarding upcoming Webex appointment, patient is notified and e-mail has been sent. °

## 2018-11-12 ENCOUNTER — Other Ambulatory Visit: Payer: 59

## 2018-11-12 ENCOUNTER — Inpatient Hospital Stay: Payer: 59 | Attending: Oncology | Admitting: Oncology

## 2018-11-12 DIAGNOSIS — Z79811 Long term (current) use of aromatase inhibitors: Secondary | ICD-10-CM

## 2018-11-12 DIAGNOSIS — Z923 Personal history of irradiation: Secondary | ICD-10-CM | POA: Diagnosis not present

## 2018-11-12 DIAGNOSIS — Z170 Estrogen receptor positive status [ER+]: Secondary | ICD-10-CM | POA: Diagnosis not present

## 2018-11-12 DIAGNOSIS — C50411 Malignant neoplasm of upper-outer quadrant of right female breast: Secondary | ICD-10-CM | POA: Diagnosis not present

## 2018-11-12 MED ORDER — ANASTROZOLE 1 MG PO TABS: 1.0000 mg | ORAL_TABLET | Freq: Every day | ORAL | 4 refills | Status: DC

## 2018-11-13 ENCOUNTER — Telehealth: Payer: Self-pay | Admitting: Oncology

## 2018-11-13 NOTE — Telephone Encounter (Signed)
Tried to reach regarding schedule °

## 2018-11-17 DIAGNOSIS — Z01419 Encounter for gynecological examination (general) (routine) without abnormal findings: Secondary | ICD-10-CM | POA: Diagnosis not present

## 2018-11-17 DIAGNOSIS — Z6839 Body mass index (BMI) 39.0-39.9, adult: Secondary | ICD-10-CM | POA: Diagnosis not present

## 2018-11-17 DIAGNOSIS — Z6837 Body mass index (BMI) 37.0-37.9, adult: Secondary | ICD-10-CM | POA: Diagnosis not present

## 2018-11-25 ENCOUNTER — Other Ambulatory Visit: Payer: Self-pay

## 2018-11-25 ENCOUNTER — Encounter (INDEPENDENT_AMBULATORY_CARE_PROVIDER_SITE_OTHER): Payer: Self-pay | Admitting: Physician Assistant

## 2018-11-25 ENCOUNTER — Ambulatory Visit (INDEPENDENT_AMBULATORY_CARE_PROVIDER_SITE_OTHER): Payer: 59 | Admitting: Physician Assistant

## 2018-11-25 DIAGNOSIS — Z6837 Body mass index (BMI) 37.0-37.9, adult: Secondary | ICD-10-CM

## 2018-11-25 DIAGNOSIS — E559 Vitamin D deficiency, unspecified: Secondary | ICD-10-CM

## 2018-11-25 NOTE — Progress Notes (Signed)
Office: [phone]  /  Fax: [phone] TeleHealth Visit:  Candace [name] has verbally consented to this TeleHealth visit today. The patient is located at home, the provider is located at the [facility] office. The participants in this visit include the listed provider and patient. The visit was conducted today via Webex (25 minutes).  HPI:   Chief Complaint: OBESITY Candace [name] is here to discuss her progress with her obesity treatment plan. She is on the Category 4 plan and is following her eating plan approximately 75% of the time. She states she is exercising 0 minutes 0 times per week. [name] reports that she is doing a better job with her snack calories. She continues to eat out and is not getting all of her protein in. We were unable to weigh the patient today for this TeleHealth visit. She feels as if she has maintained her weight since her last visit. She has lost 9 lbs since starting treatment with [name].   Vitamin D deficiency Candace [name] has a diagnosis of Vitamin D deficiency. She is currently taking Vit D and denies nausea, vomiting or muscle weakness.  ASSESSMENT AND PLAN:  Vitamin D deficiency  Class 2 severe obesity with serious comorbidity and body mass index (BMI) of 37.0 to 37.9 in adult, unspecified obesity type ([place])  PLAN:  Vitamin D Deficiency Candace [name] was informed that low Vitamin D levels contributes to fatigue and are associated with obesity, breast, and colon cancer. She agrees to continue taking Vit D and will follow-up for routine testing of Vitamin D, at least 2-3 times per year. She was informed of the risk of over-replacement of Vitamin D and agrees to not increase her dose unless she discusses this with [name] first. Candace [name] agrees to follow-up with our clinic in 2 weeks.  Obesity Candace [name] is currently in the action stage of change. As such, her goal is to continue with weight loss efforts. She has agreed to follow the Category 4 plan. Candace [name] has been  instructed to work up to a goal of 150 minutes of combined cardio and strengthening exercise per week for weight loss and overall health benefits. We discussed the following Behavioral Modification Strategies today: work on meal planning, easy cooking plans, and keeping healthy foods in the home.  Candace [name] has agreed to follow-up with our clinic in 2 weeks. She was informed of the importance of frequent follow-up visits to maximize her success with intensive lifestyle modifications for her multiple health conditions.  I spent > than 50% of the 25 minute visit on counseling as documented in the note.   ALLERGIES: No Known Allergies  MEDICATIONS: Current Outpatient Medications on File Prior to Visit  Medication Sig Dispense Refill  . anastrozole (ARIMIDEX) 1 MG tablet Take 1 tablet (1 mg total) by mouth daily. 90 tablet 4  . buPROPion (WELLBUTRIN XL) 150 MG 24 hr tablet Take 150 mg by mouth daily.    [name] buPROPion (WELLBUTRIN XL) 300 MG 24 hr tablet Take 300 mg by mouth daily.     . cetirizine (ZYRTEC) 10 MG tablet Take 10 mg by mouth daily. [facility]    . Cholecalciferol (VITAMIN D-3) 125 MCG (5000 UT) TABS Take 5,000 Units by mouth daily. 30 tablet   . esomeprazole (NEXIUM) 40 MG capsule Take 40 mg by mouth every 36 (thirty-six) hours.    [name] FLUoxetine (PROZAC) 10 MG capsule Take 10 mg by mouth daily with lunch.     . metFORMIN (GLUCOPHAGE) 500 MG  tablet Take 1 tablet (500 mg total) by mouth daily with breakfast. 30 tablet 0  . Multiple Vitamin (MULTIVITAMIN WITH MINERALS) TABS tablet Take 1 tablet by mouth daily.    [name] rOPINIRole (REQUIP) 0.25 MG tablet Take 0.5 mg by mouth daily at 8 pm.    . rosuvastatin (CRESTOR) 10 MG tablet Take 10 mg by mouth at bedtime.     No current facility-administered medications on file prior to visit.     PAST MEDICAL HISTORY: Past Medical History:  Diagnosis Date  . Abnormal Pap smear   . Alcohol abuse   . Anovulation   . Anxiety    . Breast cancer ([facility]) 04/05/2016   right breast  . Cancer ([facility]) 2017   right breast cancer  . Complication of anesthesia    slow to wake up  . Depression   . Endometrial hyperplasia   . Endometrial polyp    h/o  . Epidermal cyst    h/o  . Gallbladder problem   . GERD (gastroesophageal reflux disease)   . H/O hemorrhoids   . H/O menorrhagia   . High risk HPV infection   . Hyperlipidemia   . Insomnia   . Obesity   . Oligomenorrhea   . Prediabetes   . Restless legs syndrome   . Seasonal allergies   . Vitamin D deficiency     PAST SURGICAL HISTORY: Past Surgical History:  Procedure Laterality Date  . BREAST LUMPECTOMY Right 05/2016   radiation  . BREAST LUMPECTOMY WITH RADIOACTIVE SEED AND SENTINEL LYMPH NODE BIOPSY Right 05/28/2016   Procedure: RADIOACTIVE SEED GUIDED RIGHT BREAST LUMPECTOMY WITH  RIGHT AXILLARY SENTINEL LYMPH NODE BIOPSY;  Surgeon: [name], MD;  Location: [facility];  Service: General;  Laterality: Right;  . CARPAL TUNNEL RELEASE  03/26/2011   right hand  . CARPAL TUNNEL RELEASE  05/28/2011   Procedure: CARPAL TUNNEL RELEASE;  Surgeon: [name];  [facility];  Service: Orthopedics;  Laterality: Left;  . CHOLECYSTECTOMY N/A 01/24/2013   Procedure: LAPAROSCOPIC CHOLECYSTECTOMY WITH INTRAOPERATIVE CHOLANGIOGRAM;  Surgeon: [name], MD;  Location: [facility];  Service: General;  Laterality: N/A;  . COLONOSCOPY    . HYSTEROSCOPY    . POLYPECTOMY    . TONSILLECTOMY  1967   as child  . trigger fingers  8 yrs. ago   both done months apart  . uterine ablation  06/04/2010  . uterine ablation      SOCIAL HISTORY: Social History   Tobacco Use  . Smoking status: Never Smoker  . Smokeless tobacco: Never Used  Substance Use Topics  . Alcohol use: No    Comment: none since 1996  . Drug use: No    FAMILY HISTORY: Family History  Problem Relation Age of Onset  . Heart disease Mother   . Hypertension Mother   . Stroke Mother    . Dementia Mother        vascular dementia  . Hyperlipidemia Mother   . Depression Mother   . Anxiety disorder Mother   . Alcohol abuse Mother   . Obesity Mother   . Pulmonary fibrosis Father        d. 74  . Depression Father   . Anxiety disorder Father   . Alcohol abuse Father   . Heart attack Maternal Grandmother        d. [phone]  . Heart disease Maternal Grandmother   . Heart disease Maternal Grandfather        d.  60-63  . Leukemia Paternal Grandmother        d. late 8s  . Breast cancer Sister 35       IDC s/p mastectomy and chemo   ROS: Review of Systems  Gastrointestinal: Negative for nausea and vomiting.  Musculoskeletal:       Negative for muscle weakness.   PHYSICAL EXAM: Pt in no acute distress  RECENT LABS AND TESTS: BMET    Component Value Date/Time   NA 144 08/06/2018 0929   NA 141 04/16/2017 1409   K 4.5 08/06/2018 0929   K 3.8 04/16/2017 1409   CL 103 08/06/2018 0929   CO2 24 08/06/2018 0929   CO2 25 04/16/2017 1409   GLUCOSE 103 (H) 08/06/2018 0929   GLUCOSE 79 11/11/2017 1127   GLUCOSE 134 04/16/2017 1409   BUN 15 08/06/2018 0929   BUN 12.4 04/16/2017 1409   CREATININE 0.78 08/06/2018 0929   CREATININE 0.9 04/16/2017 1409   CALCIUM 10.0 08/06/2018 0929   CALCIUM 9.6 04/16/2017 1409   GFRNONAA 85 08/06/2018 0929   GFRAA 98 08/06/2018 0929   Lab Results  Component Value Date   HGBA1C 5.9 (H) 08/06/2018   HGBA1C 5.9 (H) 02/18/2018   HGBA1C 6.2 (H) 09/17/2017   Lab Results  Component Value Date   INSULIN 20.6 08/06/2018   INSULIN 15.2 02/18/2018   INSULIN 24.2 09/17/2017   CBC    Component Value Date/Time   WBC 6.8 11/11/2017 1127   RBC 5.02 11/11/2017 1127   HGB 14.2 11/11/2017 1127   HGB 13.7 04/16/2017 1409   HCT 42.2 11/11/2017 1127   HCT 41.2 04/16/2017 1409   PLT 248 11/11/2017 1127   PLT 259 04/16/2017 1409   MCV 84.1 11/11/2017 1127   MCV 84.4 04/16/2017 1409   MCH 28.2 11/11/2017 1127   MCHC 33.5 11/11/2017 1127    RDW 13.1 11/11/2017 1127   RDW 13.2 04/16/2017 1409   LYMPHSABS 1.7 11/11/2017 1127   LYMPHSABS 1.9 04/16/2017 1409   MONOABS 0.8 11/11/2017 1127   MONOABS 0.6 04/16/2017 1409   EOSABS 0.3 11/11/2017 1127   EOSABS 0.1 04/16/2017 1409   BASOSABS 0.0 11/11/2017 1127   BASOSABS 0.0 04/16/2017 1409   Iron/TIBC/Ferritin/ %Sat No results found for: IRON, TIBC, FERRITIN, IRONPCTSAT Lipid Panel     Component Value Date/Time   CHOL 160 08/06/2018 0929   TRIG 205 (H) 08/06/2018 0929   HDL 47 08/06/2018 0929   LDLCALC 72 08/06/2018 0929   Hepatic Function Panel     Component Value Date/Time   PROT 6.7 08/06/2018 0929   PROT 7.0 04/16/2017 1409   ALBUMIN 4.6 08/06/2018 0929   ALBUMIN 4.0 04/16/2017 1409   AST 15 08/06/2018 0929   AST 29 04/16/2017 1409   ALT 17 08/06/2018 0929   ALT 34 04/16/2017 1409   ALKPHOS 93 08/06/2018 0929   ALKPHOS 74 04/16/2017 1409   BILITOT 0.3 08/06/2018 0929   BILITOT 0.40 04/16/2017 1409      Component Value Date/Time   TSH 2.390 09/17/2017 1140    Results for CHEKESHA, BEHLKE (MRN [id number]) as of 11/25/2018 15:15  Ref. Range 08/06/2018 09:29  Vitamin D, 25-Hydroxy Latest Ref Range: 30.0 - 100.0 ng/mL 49.5    I, Michaelene Song, am acting as Location manager for [employer], PA-C I, [name], PA-C have reviewed above note and agree with its content

## 2018-11-30 MED FILL — ESOMEPRAZOLE MAG DR 40 MG C: 40 | 90 days supply | Qty: 90 | Fill #0

## 2018-12-03 MED FILL — FLUoxetine HCL 10 MG CAPS: 10 | 90 days supply | Qty: 90 | Fill #0

## 2018-12-03 MED FILL — metFORMIN HCL 500 MG TABS: 500 | 30 days supply | Qty: 30 | Fill #0

## 2018-12-08 ENCOUNTER — Encounter (INDEPENDENT_AMBULATORY_CARE_PROVIDER_SITE_OTHER): Payer: Self-pay | Admitting: Physician Assistant

## 2018-12-08 ENCOUNTER — Ambulatory Visit (INDEPENDENT_AMBULATORY_CARE_PROVIDER_SITE_OTHER): Payer: 59 | Admitting: Physician Assistant

## 2018-12-08 ENCOUNTER — Other Ambulatory Visit: Payer: Self-pay

## 2018-12-08 DIAGNOSIS — E7849 Other hyperlipidemia: Secondary | ICD-10-CM | POA: Diagnosis not present

## 2018-12-08 DIAGNOSIS — Z6837 Body mass index (BMI) 37.0-37.9, adult: Secondary | ICD-10-CM | POA: Diagnosis not present

## 2018-12-08 NOTE — Progress Notes (Signed)
Office: [phone]  /  Fax: [phone] TeleHealth Visit:  Candace [name] has verbally consented to this TeleHealth visit today. The patient is located at home, the provider is located at the [facility] office. The participants in this visit include the listed provider and patient. The visit was conducted today via Facetime.  HPI:   Chief Complaint: OBESITY Candace [name] is here to discuss her progress with her obesity treatment plan. She is on the  follow the Category 4 plan and is following her eating plan approximately 80 % of the time. She states she is exercising 0 minutes 0 times per week. Candace [name] reports that she has done much better the last 2 weeks. She got an air fryer and has been cooking all her dinners using that.  We were unable to weigh the patient today for this TeleHealth visit. She feels as if she has lost weight since her last visit. She has lost 9 lbs since starting treatment with [name].  Hyperlipidemia Candace [name] has hyperlipidemia and has been trying to improve her cholesterol levels with intensive lifestyle modification including a low saturated fat diet, exercise and weight loss. She denies any chest pain, claudication or myalgias. She is currently taking Crestor.    ASSESSMENT AND PLAN:  Other hyperlipidemia  Class 2 severe obesity with serious comorbidity and body mass index (BMI) of 37.0 to 37.9 in adult, unspecified obesity type ([place])  PLAN: Hyperlipidemia Candace [name] was informed of the American Heart Association Guidelines emphasizing intensive lifestyle modifications as the first line treatment for hyperlipidemia. We discussed many lifestyle modifications today in depth, and [name] will continue to work on decreasing saturated fats such as fatty red meat, butter and many fried foods. She will also increase vegetables and lean protein in her diet and continue to work on exercise and weight loss efforts.   Obesity Candace [name] is currently in the action stage of  change. As such, her goal is to continue with weight loss efforts She has agreed to follow the Category 4 plan Candace [name] has been instructed to work up to a goal of 150 minutes of combined cardio and strengthening exercise per week for weight loss and overall health benefits. We discussed the following Behavioral Modification Stratagies today: work on meal planning and easy cooking plans and ways to avoid boredom eating.    Candace [name] has agreed to follow up with our clinic in 2 weeks. She was informed of the importance of frequent follow up visits to maximize her success with intensive lifestyle modifications for her multiple health conditions.  I spent > than 50% of the 25 minute visit on counseling as documented in the note.   ALLERGIES: No Known Allergies  MEDICATIONS: Current Outpatient Medications on File Prior to Visit  Medication Sig Dispense Refill  . anastrozole (ARIMIDEX) 1 MG tablet Take 1 tablet (1 mg total) by mouth daily. 90 tablet 4  . buPROPion (WELLBUTRIN XL) 150 MG 24 hr tablet Take 150 mg by mouth daily.    [name] buPROPion (WELLBUTRIN XL) 300 MG 24 hr tablet Take 300 mg by mouth daily.     . cetirizine (ZYRTEC) 10 MG tablet Take 10 mg by mouth daily. [facility]    . Cholecalciferol (VITAMIN D-3) 125 MCG (5000 UT) TABS Take 5,000 Units by mouth daily. 30 tablet   . esomeprazole (NEXIUM) 40 MG capsule Take 40 mg by mouth every 36 (thirty-six) hours.    [name] FLUoxetine (PROZAC) 10 MG capsule Take 10 mg by mouth daily  with lunch.     . metFORMIN (GLUCOPHAGE) 500 MG tablet Take 1 tablet (500 mg total) by mouth daily with breakfast. 30 tablet 0  . Multiple Vitamin (MULTIVITAMIN WITH MINERALS) TABS tablet Take 1 tablet by mouth daily.    [name] rOPINIRole (REQUIP) 0.25 MG tablet Take 0.5 mg by mouth daily at 8 pm.    . rosuvastatin (CRESTOR) 10 MG tablet Take 10 mg by mouth at bedtime.     No current facility-administered medications on file prior to visit.     PAST  MEDICAL HISTORY: Past Medical History:  Diagnosis Date  . Abnormal Pap smear   . Alcohol abuse   . Anovulation   . Anxiety   . Breast cancer ([place]) 04/05/2016   right breast  . Cancer ([place]) 2017   right breast cancer  . Complication of anesthesia    slow to wake up  . Depression   . Endometrial hyperplasia   . Endometrial polyp    h/o  . Epidermal cyst    h/o  . Gallbladder problem   . GERD (gastroesophageal reflux disease)   . H/O hemorrhoids   . H/O menorrhagia   . High risk HPV infection   . Hyperlipidemia   . Insomnia   . Obesity   . Oligomenorrhea   . Prediabetes   . Restless legs syndrome   . Seasonal allergies   . Vitamin D deficiency     PAST SURGICAL HISTORY: Past Surgical History:  Procedure Laterality Date  . BREAST LUMPECTOMY Right 05/2016   radiation  . BREAST LUMPECTOMY WITH RADIOACTIVE SEED AND SENTINEL LYMPH NODE BIOPSY Right 05/28/2016   Procedure: RADIOACTIVE SEED GUIDED RIGHT BREAST LUMPECTOMY WITH  RIGHT AXILLARY SENTINEL LYMPH NODE BIOPSY;  Surgeon: [name], MD;  Location: [place];  Service: General;  Laterality: Right;  . CARPAL TUNNEL RELEASE  03/26/2011   right hand  . CARPAL TUNNEL RELEASE  05/28/2011   Procedure: CARPAL TUNNEL RELEASE;  Surgeon: [name];  Location: [facility];  Service: Orthopedics;  Laterality: Left;  . CHOLECYSTECTOMY N/A 01/24/2013   Procedure: LAPAROSCOPIC CHOLECYSTECTOMY WITH INTRAOPERATIVE CHOLANGIOGRAM;  Surgeon: [name], MD;  Location: [place];  Service: General;  Laterality: N/A;  . COLONOSCOPY    . HYSTEROSCOPY    . POLYPECTOMY    . TONSILLECTOMY  1967   as child  . trigger fingers  8 yrs. ago   both done months apart  . uterine ablation  06/04/2010  . uterine ablation      SOCIAL HISTORY: Social History   Tobacco Use  . Smoking status: Never Smoker  . Smokeless tobacco: Never Used  Substance Use Topics  . Alcohol use: No    Comment: none since 1996  . Drug use: No     FAMILY HISTORY: Family History  Problem Relation Age of Onset  . Heart disease Mother   . Hypertension Mother   . Stroke Mother   . Dementia Mother        vascular dementia  . Hyperlipidemia Mother   . Depression Mother   . Anxiety disorder Mother   . Alcohol abuse Mother   . Obesity Mother   . Pulmonary fibrosis Father        d. 24  . Depression Father   . Anxiety disorder Father   . Alcohol abuse Father   . Heart attack Maternal Grandmother        d. [phone]  . Heart disease Maternal Grandmother   . Heart  disease Maternal Grandfather        d. 60-63  . Leukemia Paternal Grandmother        d. late 47s  . Breast cancer Sister 93       IDC s/p mastectomy and chemo    ROS: Review of Systems  Cardiovascular: Negative for chest pain and claudication.  Musculoskeletal: Negative for myalgias.    PHYSICAL EXAM: Pt in no acute distress  RECENT LABS AND TESTS: BMET    Component Value Date/Time   NA 144 08/06/2018 0929   NA 141 04/16/2017 1409   K 4.5 08/06/2018 0929   K 3.8 04/16/2017 1409   CL 103 08/06/2018 0929   CO2 24 08/06/2018 0929   CO2 25 04/16/2017 1409   GLUCOSE 103 (H) 08/06/2018 0929   GLUCOSE 79 11/11/2017 1127   GLUCOSE 134 04/16/2017 1409   BUN 15 08/06/2018 0929   BUN 12.4 04/16/2017 1409   CREATININE 0.78 08/06/2018 0929   CREATININE 0.9 04/16/2017 1409   CALCIUM 10.0 08/06/2018 0929   CALCIUM 9.6 04/16/2017 1409   GFRNONAA 85 08/06/2018 0929   GFRAA 98 08/06/2018 0929   Lab Results  Component Value Date   HGBA1C 5.9 (H) 08/06/2018   HGBA1C 5.9 (H) 02/18/2018   HGBA1C 6.2 (H) 09/17/2017   Lab Results  Component Value Date   INSULIN 20.6 08/06/2018   INSULIN 15.2 02/18/2018   INSULIN 24.2 09/17/2017   CBC    Component Value Date/Time   WBC 6.8 11/11/2017 1127   RBC 5.02 11/11/2017 1127   HGB 14.2 11/11/2017 1127   HGB 13.7 04/16/2017 1409   HCT 42.2 11/11/2017 1127   HCT 41.2 04/16/2017 1409   PLT 248 11/11/2017 1127   PLT 259  04/16/2017 1409   MCV 84.1 11/11/2017 1127   MCV 84.4 04/16/2017 1409   MCH 28.2 11/11/2017 1127   MCHC 33.5 11/11/2017 1127   RDW 13.1 11/11/2017 1127   RDW 13.2 04/16/2017 1409   LYMPHSABS 1.7 11/11/2017 1127   LYMPHSABS 1.9 04/16/2017 1409   MONOABS 0.8 11/11/2017 1127   MONOABS 0.6 04/16/2017 1409   EOSABS 0.3 11/11/2017 1127   EOSABS 0.1 04/16/2017 1409   BASOSABS 0.0 11/11/2017 1127   BASOSABS 0.0 04/16/2017 1409   Iron/TIBC/Ferritin/ %Sat No results found for: IRON, TIBC, FERRITIN, IRONPCTSAT Lipid Panel     Component Value Date/Time   CHOL 160 08/06/2018 0929   TRIG 205 (H) 08/06/2018 0929   HDL 47 08/06/2018 0929   LDLCALC 72 08/06/2018 0929   Hepatic Function Panel     Component Value Date/Time   PROT 6.7 08/06/2018 0929   PROT 7.0 04/16/2017 1409   ALBUMIN 4.6 08/06/2018 0929   ALBUMIN 4.0 04/16/2017 1409   AST 15 08/06/2018 0929   AST 29 04/16/2017 1409   ALT 17 08/06/2018 0929   ALT 34 04/16/2017 1409   ALKPHOS 93 08/06/2018 0929   ALKPHOS 74 04/16/2017 1409   BILITOT 0.3 08/06/2018 0929   BILITOT 0.40 04/16/2017 1409      Component Value Date/Time   TSH 2.390 09/17/2017 1140      I, Renee Ramus, am acting as Location manager for [name], PA-C I, [name], PA-C have reviewed above note and agree with its content

## 2018-12-09 ENCOUNTER — Encounter (INDEPENDENT_AMBULATORY_CARE_PROVIDER_SITE_OTHER): Payer: Self-pay | Admitting: Physician Assistant

## 2018-12-09 NOTE — Telephone Encounter (Signed)
FYI

## 2018-12-18 MED FILL — ROSUVASTATIN CALCIUM 10 MG: 10 | 90 days supply | Qty: 90 | Fill #1

## 2018-12-18 MED FILL — ESOMEPRAZOLE MAG DR 40 MG C: 40 | 90 days supply | Qty: 90 | Fill #0

## 2018-12-22 ENCOUNTER — Telehealth (INDEPENDENT_AMBULATORY_CARE_PROVIDER_SITE_OTHER): Payer: 59 | Admitting: Physician Assistant

## 2018-12-23 ENCOUNTER — Ambulatory Visit (INDEPENDENT_AMBULATORY_CARE_PROVIDER_SITE_OTHER): Payer: 59 | Admitting: Physician Assistant

## 2018-12-23 ENCOUNTER — Encounter (INDEPENDENT_AMBULATORY_CARE_PROVIDER_SITE_OTHER): Payer: Self-pay

## 2019-01-05 ENCOUNTER — Other Ambulatory Visit (INDEPENDENT_AMBULATORY_CARE_PROVIDER_SITE_OTHER): Payer: Self-pay | Admitting: Physician Assistant

## 2019-01-05 DIAGNOSIS — R7303 Prediabetes: Secondary | ICD-10-CM

## 2019-01-05 MED FILL — buPROPion HCL ER (XL) 150 M: 150 | 90 days supply | Qty: 90 | Fill #0

## 2019-01-05 MED FILL — rOPINIRole HCL 0.5 MG TABS: 0.5 | 90 days supply | Qty: 90 | Fill #0

## 2019-01-05 MED FILL — ANASTROZOLE 1 MG TABLET: 1 | 90 days supply | Qty: 90 | Fill #0

## 2019-02-04 ENCOUNTER — Other Ambulatory Visit: Payer: Self-pay

## 2019-02-04 ENCOUNTER — Ambulatory Visit (INDEPENDENT_AMBULATORY_CARE_PROVIDER_SITE_OTHER): Payer: 59 | Admitting: Physician Assistant

## 2019-02-04 VITALS — BP 153/89 | HR 91 | Temp 97.8°F | Ht 67.0 in | Wt 253.0 lb

## 2019-02-04 DIAGNOSIS — E559 Vitamin D deficiency, unspecified: Secondary | ICD-10-CM

## 2019-02-04 DIAGNOSIS — Z6839 Body mass index (BMI) 39.0-39.9, adult: Secondary | ICD-10-CM

## 2019-02-04 DIAGNOSIS — Z9189 Other specified personal risk factors, not elsewhere classified: Secondary | ICD-10-CM

## 2019-02-04 DIAGNOSIS — R7303 Prediabetes: Secondary | ICD-10-CM | POA: Diagnosis not present

## 2019-02-04 MED ORDER — METFORMIN HCL 500 MG PO TABS: 500.0000 mg | ORAL_TABLET | Freq: Every day | ORAL | 0 refills | Status: DC

## 2019-02-04 MED FILL — metFORMIN HCL 500 MG TABS: 500 | 30 days supply | Qty: 30 | Fill #0

## 2019-02-05 LAB — LIPID PANEL WITH LDL/HDL RATIO
Cholesterol, Total: 179 mg/dL (ref 100–199)
HDL: 48 mg/dL (ref >39)
LDL Calculated: 70 mg/dL (ref 0–99)
LDl/HDL Ratio: 1.5 ratio (ref 0.0–3.2)
Triglycerides: 303 mg/dL — ABNORMAL HIGH (ref 0–149)
VLDL Cholesterol Cal: 61 mg/dL — ABNORMAL HIGH (ref 5–40)

## 2019-02-05 LAB — COMPREHENSIVE METABOLIC PANEL
ALT: 25 IU/L (ref 0–32)
AST: 20 IU/L (ref 0–40)
Albumin/Globulin Ratio: 2.6 — ABNORMAL HIGH (ref 1.2–2.2)
Albumin: 4.9 g/dL (ref 3.8–4.9)
Alkaline Phosphatase: 97 IU/L (ref 39–117)
BUN/Creatinine Ratio: 17 (ref 9–23)
BUN: 14 mg/dL (ref 6–24)
Bilirubin Total: 0.3 mg/dL (ref 0.0–1.2)
CO2: 24 mmol/L (ref 20–29)
Calcium: 10.2 mg/dL (ref 8.7–10.2)
Chloride: 100 mmol/L (ref 96–106)
Creatinine, Ser: 0.82 mg/dL (ref 0.57–1.00)
GFR calc Af Amer: 92 mL/min/{1.73_m2} (ref >59)
GFR calc non Af Amer: 80 mL/min/{1.73_m2} (ref >59)
Globulin, Total: 1.9 g/dL (ref 1.5–4.5)
Glucose: 107 mg/dL — ABNORMAL HIGH (ref 65–99)
Potassium: 4.5 mmol/L (ref 3.5–5.2)
Sodium: 140 mmol/L (ref 134–144)
Total Protein: 6.8 g/dL (ref 6.0–8.5)

## 2019-02-05 LAB — HEMOGLOBIN A1C
Est. average glucose Bld gHb Est-mCnc: 131 mg/dL
Hgb A1c MFr Bld: 6.2 % — ABNORMAL HIGH (ref 4.8–5.6)

## 2019-02-05 LAB — VITAMIN D 25 HYDROXY (VIT D DEFICIENCY, FRACTURES): Vit D, 25-Hydroxy: 50.6 ng/mL (ref 30.0–100.0)

## 2019-02-05 LAB — INSULIN, RANDOM: INSULIN: 23.8 u[IU]/mL (ref 2.6–24.9)

## 2019-02-08 NOTE — Progress Notes (Signed)
Office: [phone]  /  Fax: [phone]   HPI:   Chief Complaint: OBESITY Candace [name] is here to discuss her progress with her obesity treatment plan. She is on the Category 4 plan and is following her eating plan approximately 20 % of the time. She states she is exercising 0 minutes 0 times per week. Candace [name] reports that she has not been following the plan at home, due to family stress and lack of motivation. She is ready to get back on track. Her weight is 253 lb (114.8 kg) today and has had a weight gain of 13 pounds since her last in-office visit. She has gained 4 lbs since starting treatment with [name].  Pre-Diabetes Candace [name] has a diagnosis of prediabetes based on her elevated Hgb A1c and was informed this puts her at greater risk of developing diabetes. Candace [name] has not been taking her metformin because she ran out of her medications. Candace [name] continues to work on diet and exercise to decrease risk of diabetes. She denies nausea or hypoglycemia.  At risk for diabetes Candace [name] is at higher than average risk for developing diabetes due to her obesity and prediabetes. She currently denies polyuria or polydipsia.  Vitamin D deficiency Candace [name] has a diagnosis of vitamin D deficiency. Candace [name] is currently taking vit D and she denies nausea, vomiting or muscle weakness.  ASSESSMENT AND PLAN:  Prediabetes - Plan: metFORMIN (GLUCOPHAGE) 500 MG tablet, Comprehensive metabolic panel, Hemoglobin A1c, Insulin, random, Lipid Panel With LDL/HDL Ratio  Vitamin D deficiency - Plan: VITAMIN D 25 Hydroxy (Vit-D Deficiency, Fractures)  At risk for diabetes mellitus  Class 2 severe obesity with serious comorbidity and body mass index (BMI) of 39.0 to 39.9 in adult, unspecified obesity type ([place])  PLAN:  Pre-Diabetes Candace [name] will continue to work on weight loss, exercise, and decreasing simple carbohydrates in her diet to help decrease the risk of diabetes. We dicussed metformin including benefits and risks. She  was informed that eating too many simple carbohydrates or too many calories at one sitting increases the likelihood of GI side effects. Modesty agrees to take metformin 500 mg daily with breakfast #30 with no refills and follow up with [name] as directed to monitor her progress.  Diabetes risk counseling Candace [name] was given extended (15 minutes) diabetes prevention counseling today. She is 58 y.o. female and has risk factors for diabetes including obesity and prediabetes. We discussed intensive lifestyle modifications today with an emphasis on weight loss as well as increasing exercise and decreasing simple carbohydrates in her diet.  Vitamin D Deficiency Candace [name] was informed that low vitamin D levels contributes to fatigue and are associated with obesity, breast, and colon cancer. She will continue to take OTC vitamin D 5,000 IU daily and will follow up for routine testing of vitamin D, at least 2-3 times per year. She was informed of the risk of over-replacement of vitamin D and agrees to not increase her dose unless she discusses this with [name] first.  Obesity Candace [name] is currently in the action stage of change. As such, her goal is to continue with weight loss efforts She has agreed to follow the Category 4 plan Candace [name] has been instructed to work up to a goal of 150 minutes of combined cardio and strengthening exercise per week for weight loss and overall health benefits. We discussed the following Behavioral Modification Strategies today: keeping healthy foods in the home and work on meal planning and easy cooking plans  Candace [name] has agreed to follow up with our clinic  in 2 weeks. She was informed of the importance of frequent follow up visits to maximize her success with intensive lifestyle modifications for her multiple health conditions.  ALLERGIES: No Known Allergies  MEDICATIONS: Current Outpatient Medications on File Prior to Visit  Medication Sig Dispense Refill   anastrozole (ARIMIDEX) 1 MG  tablet Take 1 tablet (1 mg total) by mouth daily. 90 tablet 4   buPROPion (WELLBUTRIN XL) 150 MG 24 hr tablet Take 150 mg by mouth daily.     buPROPion (WELLBUTRIN XL) 300 MG 24 hr tablet Take 300 mg by mouth daily.      cetirizine (ZYRTEC) 10 MG tablet Take 10 mg by mouth daily. [facility]     Cholecalciferol (VITAMIN D-3) 125 MCG (5000 UT) TABS Take 5,000 Units by mouth daily. 30 tablet    esomeprazole (NEXIUM) 40 MG capsule Take 40 mg by mouth every 36 (thirty-six) hours.     FLUoxetine (PROZAC) 10 MG capsule Take 10 mg by mouth daily with lunch.      Multiple Vitamin (MULTIVITAMIN WITH MINERALS) TABS tablet Take 1 tablet by mouth daily.     rOPINIRole (REQUIP) 0.25 MG tablet Take 0.5 mg by mouth daily at 8 pm.     rosuvastatin (CRESTOR) 10 MG tablet Take 10 mg by mouth at bedtime.     No current facility-administered medications on file prior to visit.     PAST MEDICAL HISTORY: Past Medical History:  Diagnosis Date   Abnormal Pap smear    Alcohol abuse    Anovulation    Anxiety    Breast cancer ([place]) 04/05/2016   right breast   Cancer ([place]) 2017   right breast cancer   Complication of anesthesia    slow to wake up   Depression    Endometrial hyperplasia    Endometrial polyp    h/o   Epidermal cyst    h/o   Gallbladder problem    GERD (gastroesophageal reflux disease)    H/O hemorrhoids    H/O menorrhagia    High risk HPV infection    Hyperlipidemia    Insomnia    Obesity    Oligomenorrhea    Prediabetes    Restless legs syndrome    Seasonal allergies    Vitamin D deficiency     PAST SURGICAL HISTORY: Past Surgical History:  Procedure Laterality Date   BREAST LUMPECTOMY Right 05/2016   radiation   BREAST LUMPECTOMY WITH RADIOACTIVE SEED AND SENTINEL LYMPH NODE BIOPSY Right 05/28/2016   Procedure: RADIOACTIVE SEED GUIDED RIGHT BREAST LUMPECTOMY WITH  RIGHT AXILLARY SENTINEL LYMPH NODE BIOPSY;  Surgeon:  [name], MD;  Location: [place];  Service: General;  Laterality: Right;   CARPAL TUNNEL RELEASE  03/26/2011   right hand   CARPAL TUNNEL RELEASE  05/28/2011   Procedure: CARPAL TUNNEL RELEASE;  Surgeon: [name];  [facility];  Service: Orthopedics;  Laterality: Left;   CHOLECYSTECTOMY N/A 01/24/2013   Procedure: LAPAROSCOPIC CHOLECYSTECTOMY WITH INTRAOPERATIVE CHOLANGIOGRAM;  Surgeon: [name], MD;  Location: [place];  Service: General;  Laterality: N/A;   COLONOSCOPY     HYSTEROSCOPY     [place]   as child   trigger fingers  8 yrs. ago   both done months apart   uterine ablation  06/04/2010   uterine ablation      SOCIAL HISTORY: Social History   Tobacco Use   Smoking status: Never Smoker  Smokeless tobacco: Never Used  Substance Use Topics   Alcohol use: No    Comment: none since 1996   Drug use: No    FAMILY HISTORY: Family History  Problem Relation Age of Onset   Heart disease Mother    Hypertension Mother    Stroke Mother    Dementia Mother        vascular dementia   Hyperlipidemia Mother    Depression Mother    Anxiety disorder Mother    Alcohol abuse Mother    Obesity Mother    Pulmonary fibrosis Father        d. 61   Depression Father    Anxiety disorder Father    Alcohol abuse Father    Heart attack Maternal Grandmother        d. 42-65y   Heart disease Maternal Grandmother    Heart disease Maternal Grandfather        d. 80-63   Leukemia Paternal Grandmother        d. late 74s   Breast cancer Sister 59       IDC s/p mastectomy and chemo    ROS: Review of Systems  Constitutional: Negative for weight loss.  Gastrointestinal: Negative for nausea and vomiting.  Genitourinary: Negative for frequency.  Musculoskeletal:       Negative for muscle weakness  Endo/Heme/Allergies: Negative for polydipsia.       Negative for hypoglycemia    PHYSICAL  EXAM: Blood pressure (!) 153/89, pulse 91, temperature 97.8 F (36.6 C), temperature source Oral, height 5\' 7"  (1.702 m), weight 253 lb (114.8 kg), SpO2 96 %. Body mass index is 39.63 kg/m. Physical Exam Vitals signs reviewed.  Constitutional:      Appearance: Normal appearance. She is well-developed. She is obese.  Cardiovascular:     Rate and Rhythm: Normal rate.  Pulmonary:     Effort: Pulmonary effort is normal.  Musculoskeletal: Normal range of motion.  Skin:    General: Skin is warm and dry.  Neurological:     Mental Status: She is alert and oriented to person, place, and time.  Psychiatric:        Mood and Affect: Mood normal.        Behavior: Behavior normal.     RECENT LABS AND TESTS: BMET    Component Value Date/Time   NA 140 02/04/2019 1121   NA 141 04/16/2017 1409   K 4.5 02/04/2019 1121   K 3.8 04/16/2017 1409   CL 100 02/04/2019 1121   CO2 24 02/04/2019 1121   CO2 25 04/16/2017 1409   GLUCOSE 107 (H) 02/04/2019 1121   GLUCOSE 79 11/11/2017 1127   GLUCOSE 134 04/16/2017 1409   BUN 14 02/04/2019 1121   BUN 12.4 04/16/2017 1409   CREATININE 0.82 02/04/2019 1121   CREATININE 0.9 04/16/2017 1409   CALCIUM 10.2 02/04/2019 1121   CALCIUM 9.6 04/16/2017 1409   GFRNONAA 80 02/04/2019 1121   GFRAA 92 02/04/2019 1121   Lab Results  Component Value Date   HGBA1C 6.2 (H) 02/04/2019   HGBA1C 5.9 (H) 08/06/2018   HGBA1C 5.9 (H) 02/18/2018   HGBA1C 6.2 (H) 09/17/2017   Lab Results  Component Value Date   INSULIN 23.8 02/04/2019   INSULIN 20.6 08/06/2018   INSULIN 15.2 02/18/2018   INSULIN 24.2 09/17/2017   CBC    Component Value Date/Time   WBC 6.8 11/11/2017 1127   RBC 5.02 11/11/2017 1127   HGB 14.2 11/11/2017 1127   HGB  13.7 04/16/2017 1409   HCT 42.2 11/11/2017 1127   HCT 41.2 04/16/2017 1409   PLT 248 11/11/2017 1127   PLT 259 04/16/2017 1409   MCV 84.1 11/11/2017 1127   MCV 84.4 04/16/2017 1409   MCH 28.2 11/11/2017 1127   MCHC 33.5  11/11/2017 1127   RDW 13.1 11/11/2017 1127   RDW 13.2 04/16/2017 1409   LYMPHSABS 1.7 11/11/2017 1127   LYMPHSABS 1.9 04/16/2017 1409   MONOABS 0.8 11/11/2017 1127   MONOABS 0.6 04/16/2017 1409   EOSABS 0.3 11/11/2017 1127   EOSABS 0.1 04/16/2017 1409   BASOSABS 0.0 11/11/2017 1127   BASOSABS 0.0 04/16/2017 1409   Iron/TIBC/Ferritin/ %Sat No results found for: IRON, TIBC, FERRITIN, IRONPCTSAT Lipid Panel     Component Value Date/Time   CHOL 179 02/04/2019 1121   TRIG 303 (H) 02/04/2019 1121   HDL 48 02/04/2019 1121   LDLCALC 70 02/04/2019 1121   Hepatic Function Panel     Component Value Date/Time   PROT 6.8 02/04/2019 1121   PROT 7.0 04/16/2017 1409   ALBUMIN 4.9 02/04/2019 1121   ALBUMIN 4.0 04/16/2017 1409   AST 20 02/04/2019 1121   AST 29 04/16/2017 1409   ALT 25 02/04/2019 1121   ALT 34 04/16/2017 1409   ALKPHOS 97 02/04/2019 1121   ALKPHOS 74 04/16/2017 1409   BILITOT 0.3 02/04/2019 1121   BILITOT 0.40 04/16/2017 1409      Component Value Date/Time   TSH 2.390 09/17/2017 1140     Ref. Range 08/06/2018 09:29  Vitamin D, 25-Hydroxy Latest Ref Range: 30.0 - 100.0 ng/mL 49.5    OBESITY BEHAVIORAL INTERVENTION VISIT  Today's visit was # 26  Starting weight: 249 lbs Starting date: 09/17/2017 Today's weight : 253 lbs Today's date: 02/04/2019 Total lbs lost to date: 0    02/04/2019  Height 5\' 7"  (1.702 m)  Weight 253 lb (114.8 kg)  BMI (Calculated) 39.62  BLOOD PRESSURE - SYSTOLIC 035  BLOOD PRESSURE - DIASTOLIC 89   Body Fat % 59.7 %  Total Body Water (lbs) 94.2 lbs    ASK: We discussed the diagnosis of obesity with Leland Johns today and Katherinne agreed to give [name] permission to discuss obesity behavioral modification therapy today.  ASSESS: Aja has the diagnosis of obesity and her BMI today is 39.62 Satori is in the action stage of change   ADVISE: Renae was educated on the multiple health risks of obesity as well as the benefit of weight loss  to improve her health. She was advised of the need for long term treatment and the importance of lifestyle modifications to improve her current health and to decrease her risk of future health problems.  AGREE: Multiple dietary modification options and treatment options were discussed and  Domanique agreed to follow the recommendations documented in the above note.  ARRANGE: Undrea was educated on the importance of frequent visits to treat obesity as outlined per CMS and USPSTF guidelines and agreed to schedule her next follow up appointment today.  Corey Skains, am acting as transcriptionist for [name], PA-C I, [name], PA-C have reviewed above note and agree with its content

## 2019-02-11 DIAGNOSIS — H5213 Myopia, bilateral: Secondary | ICD-10-CM | POA: Diagnosis not present

## 2019-02-11 DIAGNOSIS — H524 Presbyopia: Secondary | ICD-10-CM | POA: Diagnosis not present

## 2019-02-16 MED FILL — buPROPion HCL ER (XL) 300 M: 300 | 90 days supply | Qty: 90 | Fill #0

## 2019-02-18 ENCOUNTER — Encounter (INDEPENDENT_AMBULATORY_CARE_PROVIDER_SITE_OTHER): Payer: Self-pay | Admitting: Physician Assistant

## 2019-02-18 ENCOUNTER — Ambulatory Visit (INDEPENDENT_AMBULATORY_CARE_PROVIDER_SITE_OTHER): Payer: 59 | Admitting: Physician Assistant

## 2019-02-18 ENCOUNTER — Other Ambulatory Visit: Payer: Self-pay

## 2019-02-18 VITALS — BP 142/87 | HR 105 | Temp 98.5°F | Ht 67.0 in | Wt 253.0 lb

## 2019-02-18 DIAGNOSIS — Z6839 Body mass index (BMI) 39.0-39.9, adult: Secondary | ICD-10-CM | POA: Diagnosis not present

## 2019-02-18 DIAGNOSIS — Z9189 Other specified personal risk factors, not elsewhere classified: Secondary | ICD-10-CM

## 2019-02-18 DIAGNOSIS — R7303 Prediabetes: Secondary | ICD-10-CM

## 2019-02-18 DIAGNOSIS — E7849 Other hyperlipidemia: Secondary | ICD-10-CM

## 2019-02-18 MED ORDER — METFORMIN HCL 500 MG PO TABS: 500.0000 mg | ORAL_TABLET | Freq: Two times a day (BID) | ORAL | 0 refills | Status: DC

## 2019-02-18 NOTE — Progress Notes (Signed)
Office: [phone]  /  Fax: [phone]   HPI:   Chief Complaint: OBESITY Lovella is here to discuss her progress with her obesity treatment plan. She is on the  follow the Category 4 plan and is following her eating plan approximately 60 % of the time. She states she is exercising by walking for 20-30 minutes 5 times per week. Chirstine reports that she is following the plan for breakfast and lunch but not for dinner. She is struggling with being at home so much due to [place].  Her weight is 253 lb (114.8 kg) today and has not lost weight since her last visit. She has lost 0 lbs since starting treatment with [name].  Pre-Diabetes Tiondra has a diagnosis of prediabetes based on her elevated HgA1c of 6.2, worsened on labs on 8/20  and was informed this puts her at greater risk of developing diabetes. She is taking metformin currently and continues to work on diet and exercise to decrease risk of diabetes. She denies nausea, vomiting, diarrhea, or hypoglycemia.  Hyperlipidemia Naleah has hyperlipidemia and has been trying to improve her cholesterol levels with intensive lifestyle modification including a low saturated fat diet, exercise and weight loss. She denies any chest pain, claudication or myalgias. She is on Crestor.   At risk for diabetes Zahra is at higher than averagerisk for developing diabetes due to her obesity. She currently denies polyuria or polydipsia.  ASSESSMENT AND PLAN:  Prediabetes - Plan: metFORMIN (GLUCOPHAGE) 500 MG tablet  Other hyperlipidemia  At risk for diabetes mellitus  Class 2 severe obesity with serious comorbidity and body mass index (BMI) of 39.0 to 39.9 in adult, unspecified obesity type [facility])  PLAN: Pre-Diabetes Anyra will continue to work on weight loss, exercise, and decreasing simple carbohydrates in her diet to help decrease the risk of diabetes. We dicussed metformin including benefits and risks. She was informed that eating too many simple  carbohydrates or too many calories at one sitting increases the likelihood of GI side effects. Magaly agrees to increase Metformin to BID #60 with no refills . Starlett agreed to follow up with [name] as directed to monitor her progress.  Hyperlipidemia Ellary was informed of the American Heart Association Guidelines emphasizing intensive lifestyle modifications as the first line treatment for hyperlipidemia. We discussed many lifestyle modifications today in depth, and Makinlee will continue to work on decreasing saturated fats such as fatty red meat, butter and many fried foods. She will also increase vegetables and lean protein in her diet and continue to work on exercise and weight loss efforts.  Diabetes risk counseling Shera was given extended (15 minutes) diabetes prevention counseling today. She is 58 y.o. female and has risk factors for diabetes including obesity. We discussed intensive lifestyle modifications today with an emphasis on weight loss as well as increasing exercise and decreasing simple carbohydrates in her diet.  Obesity Everlina is currently in the action stage of change. As such, her goal is to continue with weight loss efforts She has agreed to follow the Category 4 plan Debar has been instructed to work up to a goal of 150 minutes of combined cardio and strengthening exercise per week for weight loss and overall health benefits. We discussed the following Behavioral Modification Strategies today: work on meal planning and easy cooking plans and keeping healthy foods in the home.    Rosabella has agreed to follow up with our clinic in 2 weeks. She was informed of the importance of frequent follow up visits  to maximize her success with intensive lifestyle modifications for her multiple health conditions.  ALLERGIES: No Known Allergies  MEDICATIONS: Current Outpatient Medications on File Prior to Visit  Medication Sig Dispense Refill  . anastrozole (ARIMIDEX) 1 MG tablet Take 1  tablet (1 mg total) by mouth daily. 90 tablet 4  . buPROPion (WELLBUTRIN XL) 300 MG 24 hr tablet Take 300 mg by mouth daily.     . cetirizine (ZYRTEC) 10 MG tablet Take 10 mg by mouth daily. [facility]    . Cholecalciferol (VITAMIN D-3) 125 MCG (5000 UT) TABS Take 5,000 Units by mouth daily. 30 tablet   . esomeprazole (NEXIUM) 40 MG capsule Take 40 mg by mouth every 36 (thirty-six) hours.    [name] FLUoxetine (PROZAC) 10 MG capsule Take 10 mg by mouth daily with lunch.     . Multiple Vitamin (MULTIVITAMIN WITH MINERALS) TABS tablet Take 1 tablet by mouth daily.    [name] rOPINIRole (REQUIP) 0.25 MG tablet Take 0.5 mg by mouth daily at 8 pm.    . rosuvastatin (CRESTOR) 10 MG tablet Take 10 mg by mouth at bedtime.     No current facility-administered medications on file prior to visit.     PAST MEDICAL HISTORY: Past Medical History:  Diagnosis Date  . Abnormal Pap smear   . Alcohol abuse   . Anovulation   . Anxiety   . Breast cancer ([place]) 04/05/2016   right breast  . Cancer ([place]) 2017   right breast cancer  . Complication of anesthesia    slow to wake up  . Depression   . Endometrial hyperplasia   . Endometrial polyp    h/o  . Epidermal cyst    h/o  . Gallbladder problem   . GERD (gastroesophageal reflux disease)   . H/O hemorrhoids   . H/O menorrhagia   . High risk HPV infection   . Hyperlipidemia   . Insomnia   . Obesity   . Oligomenorrhea   . Prediabetes   . Restless legs syndrome   . Seasonal allergies   . Vitamin D deficiency     PAST SURGICAL HISTORY: Past Surgical History:  Procedure Laterality Date  . BREAST LUMPECTOMY Right 05/2016   radiation  . BREAST LUMPECTOMY WITH RADIOACTIVE SEED AND SENTINEL LYMPH NODE BIOPSY Right 05/28/2016   Procedure: RADIOACTIVE SEED GUIDED RIGHT BREAST LUMPECTOMY WITH  RIGHT AXILLARY SENTINEL LYMPH NODE BIOPSY;  Surgeon: [name], MD;  Location: [facility];  Service: General;  Laterality: Right;  . CARPAL TUNNEL  RELEASE  03/26/2011   right hand  . CARPAL TUNNEL RELEASE  05/28/2011   Procedure: CARPAL TUNNEL RELEASE;  Surgeon: [name];  Location: [facility];  Service: Orthopedics;  Laterality: Left;  . CHOLECYSTECTOMY N/A 01/24/2013   Procedure: LAPAROSCOPIC CHOLECYSTECTOMY WITH INTRAOPERATIVE CHOLANGIOGRAM;  Surgeon: [name], MD;  Location: [facility];  Service: General;  Laterality: N/A;  . COLONOSCOPY    . HYSTEROSCOPY    . POLYPECTOMY    . TONSILLECTOMY  1967   as child  . trigger fingers  8 yrs. ago   both done months apart  . uterine ablation  06/04/2010  . uterine ablation      SOCIAL HISTORY: Social History   Tobacco Use  . Smoking status: Never Smoker  . Smokeless tobacco: Never Used  Substance Use Topics  . Alcohol use: No    Comment: none since 1996  . Drug use: No    FAMILY HISTORY: Family History  Problem Relation Age of Onset  . Heart disease Mother   . Hypertension Mother   . Stroke Mother   . Dementia Mother        vascular dementia  . Hyperlipidemia Mother   . Depression Mother   . Anxiety disorder Mother   . Alcohol abuse Mother   . Obesity Mother   . Pulmonary fibrosis Father        d. 30  . Depression Father   . Anxiety disorder Father   . Alcohol abuse Father   . Heart attack Maternal Grandmother        d. [phone]  . Heart disease Maternal Grandmother   . Heart disease Maternal Grandfather        d. 60-63  . Leukemia Paternal Grandmother        d. late 58s  . Breast cancer Sister 23       IDC s/p mastectomy and chemo    ROS: Review of Systems  Constitutional: Negative for weight loss.  Cardiovascular: Negative for chest pain and claudication.  Gastrointestinal: Negative for diarrhea, nausea and vomiting.  Musculoskeletal: Negative for myalgias.  Endo/Heme/Allergies:       Negative for hypoglycemia     PHYSICAL EXAM: Blood pressure (!) 142/87, pulse (!) 105, temperature 98.5 F (36.9 C), temperature source Oral, height 5'  7" (1.702 m), weight 253 lb (114.8 kg), SpO2 96 %. Body mass index is 39.63 kg/m. Physical Exam Vitals signs reviewed.  HENT:     Head: Normocephalic.     Nose: Nose normal.  Neck:     Musculoskeletal: Normal range of motion.  Cardiovascular:     Rate and Rhythm: Normal rate.  Pulmonary:     Effort: Pulmonary effort is normal.  Musculoskeletal: Normal range of motion.  Skin:    General: Skin is warm and dry.  Neurological:     Mental Status: She is alert and oriented to person, place, and time.  Psychiatric:        Mood and Affect: Mood normal.        Behavior: Behavior normal.     RECENT LABS AND TESTS: BMET    Component Value Date/Time   NA 140 02/04/2019 1121   NA 141 04/16/2017 1409   K 4.5 02/04/2019 1121   K 3.8 04/16/2017 1409   CL 100 02/04/2019 1121   CO2 24 02/04/2019 1121   CO2 25 04/16/2017 1409   GLUCOSE 107 (H) 02/04/2019 1121   GLUCOSE 79 11/11/2017 1127   GLUCOSE 134 04/16/2017 1409   BUN 14 02/04/2019 1121   BUN 12.4 04/16/2017 1409   CREATININE 0.82 02/04/2019 1121   CREATININE 0.9 04/16/2017 1409   CALCIUM 10.2 02/04/2019 1121   CALCIUM 9.6 04/16/2017 1409   GFRNONAA 80 02/04/2019 1121   GFRAA 92 02/04/2019 1121   Lab Results  Component Value Date   HGBA1C 6.2 (H) 02/04/2019   HGBA1C 5.9 (H) 08/06/2018   HGBA1C 5.9 (H) 02/18/2018   HGBA1C 6.2 (H) 09/17/2017   Lab Results  Component Value Date   INSULIN 23.8 02/04/2019   INSULIN 20.6 08/06/2018   INSULIN 15.2 02/18/2018   INSULIN 24.2 09/17/2017   CBC    Component Value Date/Time   WBC 6.8 11/11/2017 1127   RBC 5.02 11/11/2017 1127   HGB 14.2 11/11/2017 1127   HGB 13.7 04/16/2017 1409   HCT 42.2 11/11/2017 1127   HCT 41.2 04/16/2017 1409   PLT 248 11/11/2017 1127   PLT 259 04/16/2017 1409  MCV 84.1 11/11/2017 1127   MCV 84.4 04/16/2017 1409   MCH 28.2 11/11/2017 1127   MCHC 33.5 11/11/2017 1127   RDW 13.1 11/11/2017 1127   RDW 13.2 04/16/2017 1409   LYMPHSABS 1.7  11/11/2017 1127   LYMPHSABS 1.9 04/16/2017 1409   MONOABS 0.8 11/11/2017 1127   MONOABS 0.6 04/16/2017 1409   EOSABS 0.3 11/11/2017 1127   EOSABS 0.1 04/16/2017 1409   BASOSABS 0.0 11/11/2017 1127   BASOSABS 0.0 04/16/2017 1409   Iron/TIBC/Ferritin/ %Sat No results found for: IRON, TIBC, FERRITIN, IRONPCTSAT Lipid Panel     Component Value Date/Time   CHOL 179 02/04/2019 1121   TRIG 303 (H) 02/04/2019 1121   HDL 48 02/04/2019 1121   LDLCALC 70 02/04/2019 1121   Hepatic Function Panel     Component Value Date/Time   PROT 6.8 02/04/2019 1121   PROT 7.0 04/16/2017 1409   ALBUMIN 4.9 02/04/2019 1121   ALBUMIN 4.0 04/16/2017 1409   AST 20 02/04/2019 1121   AST 29 04/16/2017 1409   ALT 25 02/04/2019 1121   ALT 34 04/16/2017 1409   ALKPHOS 97 02/04/2019 1121   ALKPHOS 74 04/16/2017 1409   BILITOT 0.3 02/04/2019 1121   BILITOT 0.40 04/16/2017 1409      Component Value Date/Time   TSH 2.390 09/17/2017 1140      OBESITY BEHAVIORAL INTERVENTION VISIT  Today's visit was # 27 Starting weight: 249 lbs Starting date: 09/17/17 Today's weight : Weight: 253 lb (114.8 kg)  Today's date: 02/18/2019 Total lbs lost to date: 0 At least 15 minutes were spent on discussing the following behavioral intervention visit.   ASK: We discussed the diagnosis of obesity with Leland Johns today and Mckinnley agreed to give [name] permission to discuss obesity behavioral modification therapy today.  ASSESS: Bryla has the diagnosis of obesity and her BMI today is 39.62 Normajean is in the action stage of change   ADVISE: Trystin was educated on the multiple health risks of obesity as well as the benefit of weight loss to improve her health. She was advised of the need for long term treatment and the importance of lifestyle modifications to improve her current health and to decrease her risk of future health problems.  AGREE: Multiple dietary modification options and treatment options were discussed  and  Iyannah agreed to follow the recommendations documented in the above note.  ARRANGE: Svea was educated on the importance of frequent visits to treat obesity as outlined per CMS and USPSTF guidelines and agreed to schedule her next follow up appointment today.  Leary Roca, am acting as transcriptionist for [name], PA-C  I, [name], PA-C have reviewed above note and agree with its content

## 2019-02-26 MED FILL — metFORMIN HCL 500 MG TABS: 500 | 30 days supply | Qty: 60 | Fill #0

## 2019-03-04 ENCOUNTER — Ambulatory Visit (INDEPENDENT_AMBULATORY_CARE_PROVIDER_SITE_OTHER): Payer: 59 | Admitting: Physician Assistant

## 2019-03-04 ENCOUNTER — Encounter (INDEPENDENT_AMBULATORY_CARE_PROVIDER_SITE_OTHER): Payer: Self-pay | Admitting: Physician Assistant

## 2019-03-04 ENCOUNTER — Other Ambulatory Visit: Payer: Self-pay

## 2019-03-04 VITALS — BP 137/86 | HR 104 | Temp 98.7°F | Ht 67.0 in | Wt 250.0 lb

## 2019-03-04 DIAGNOSIS — Z6839 Body mass index (BMI) 39.0-39.9, adult: Secondary | ICD-10-CM | POA: Diagnosis not present

## 2019-03-04 DIAGNOSIS — E559 Vitamin D deficiency, unspecified: Secondary | ICD-10-CM

## 2019-03-09 NOTE — Progress Notes (Signed)
Office: [phone]  /  Fax: [phone]   HPI:   Chief Complaint: OBESITY Candace [name] is here to discuss her progress with her obesity treatment plan. She is on the Category 4 plan and is following her eating plan approximately 75 % of the time. She states she is walking 30 minutes 4 times per week. Candace [name] reports that she is doing much better sticking with the plan, especially at dinner. Her weight is 250 lb (113.4 kg) today and has had a weight loss of 3 pounds over a period of 2 weeks since her last visit. She has gained 1 lb since starting treatment with [name].  Vitamin D deficiency Candace [name] has a diagnosis of vitamin D deficiency. She is currently taking vit D and denies nausea, vomiting or muscle weakness.  ASSESSMENT AND PLAN:  Vitamin D deficiency  Class 2 severe obesity with serious comorbidity and body mass index (BMI) of 39.0 to 39.9 in adult, unspecified obesity type ([place])  PLAN:  Vitamin D Deficiency [name] was informed that low vitamin D levels contributes to fatigue and are associated with obesity, breast, and colon cancer. She will continue to take OTC vitamin D and will follow up for routine testing of vitamin D, at least 2-3 times per year. She was informed of the risk of over-replacement of vitamin D and agrees to not increase her dose unless she discusses this with [name] first.  I spent > than 50% of the 25 minute visit on counseling as documented in the note.  Obesity Candace [name] is currently in the action stage of change. As such, her goal is to continue with weight loss efforts She has agreed to follow the Category 4 plan Candace [name] has been instructed to work up to a goal of 150 minutes of combined cardio and strengthening exercise per week for weight loss and overall health benefits. We discussed the following Behavioral Modification Strategies today: keeping healthy foods in the home and work on meal planning and easy cooking plans  Candace [name] has agreed to follow up with our  clinic in 2 weeks. She was informed of the importance of frequent follow up visits to maximize her success with intensive lifestyle modifications for her multiple health conditions.  I spent > than 50% of the 25 minute visit on counseling as documented in the note.   ALLERGIES: No Known Allergies  MEDICATIONS: Current Outpatient Medications on File Prior to Visit  Medication Sig Dispense Refill   anastrozole (ARIMIDEX) 1 MG tablet Take 1 tablet (1 mg total) by mouth daily. 90 tablet 4   buPROPion (WELLBUTRIN XL) 300 MG 24 hr tablet Take 300 mg by mouth daily.      cetirizine (ZYRTEC) 10 MG tablet Take 10 mg by mouth daily. [facility]     Cholecalciferol (VITAMIN D-3) 125 MCG (5000 UT) TABS Take 5,000 Units by mouth daily. 30 tablet    esomeprazole (NEXIUM) 40 MG capsule Take 40 mg by mouth every 36 (thirty-six) hours.     FLUoxetine (PROZAC) 10 MG capsule Take 10 mg by mouth daily with lunch.      metFORMIN (GLUCOPHAGE) 500 MG tablet Take 1 tablet (500 mg total) by mouth 2 (two) times daily with a meal. (Patient taking differently: Take 1,000 mg by mouth daily with breakfast. ) 60 tablet 0   Multiple Vitamin (MULTIVITAMIN WITH MINERALS) TABS tablet Take 1 tablet by mouth daily.     rOPINIRole (REQUIP) 0.25 MG tablet Take 0.5 mg by mouth daily at 8 pm.  rosuvastatin (CRESTOR) 10 MG tablet Take 10 mg by mouth at bedtime.     No current facility-administered medications on file prior to visit.     PAST MEDICAL HISTORY: Past Medical History:  Diagnosis Date   Abnormal Pap smear    Alcohol abuse    Anovulation    Anxiety    Breast cancer ([place]) 04/05/2016   right breast   Cancer ([place]) 2017   right breast cancer   Complication of anesthesia    slow to wake up   Depression    Endometrial hyperplasia    Endometrial polyp    h/o   Epidermal cyst    h/o   Gallbladder problem    GERD (gastroesophageal reflux disease)    H/O hemorrhoids     H/O menorrhagia    High risk HPV infection    Hyperlipidemia    Insomnia    Obesity    Oligomenorrhea    Prediabetes    Restless legs syndrome    Seasonal allergies    Vitamin D deficiency     PAST SURGICAL HISTORY: Past Surgical History:  Procedure Laterality Date   BREAST LUMPECTOMY Right 05/2016   radiation   BREAST LUMPECTOMY WITH RADIOACTIVE SEED AND SENTINEL LYMPH NODE BIOPSY Right 05/28/2016   Procedure: RADIOACTIVE SEED GUIDED RIGHT BREAST LUMPECTOMY WITH  RIGHT AXILLARY SENTINEL LYMPH NODE BIOPSY;  Surgeon: [name], MD;  Location: [facility];  Service: General;  Laterality: Right;   CARPAL TUNNEL RELEASE  03/26/2011   right hand   CARPAL TUNNEL RELEASE  05/28/2011   Procedure: CARPAL TUNNEL RELEASE;  Surgeon: [name];  [facility];  Service: Orthopedics;  Laterality: Left;   CHOLECYSTECTOMY N/A 01/24/2013   Procedure: LAPAROSCOPIC CHOLECYSTECTOMY WITH INTRAOPERATIVE CHOLANGIOGRAM;  Surgeon: [name], MD;  Location: [facility];  Service: General;  Laterality: N/A;   COLONOSCOPY     HYSTEROSCOPY     [place]   as child   trigger fingers  8 yrs. ago   both done months apart   uterine ablation  06/04/2010   uterine ablation      SOCIAL HISTORY: Social History   Tobacco Use   Smoking status: Never Smoker   Smokeless tobacco: Never Used  Substance Use Topics   Alcohol use: No    Comment: none since 1996   Drug use: No    FAMILY HISTORY: Family History  Problem Relation Age of Onset   Heart disease Mother    Hypertension Mother    Stroke Mother    Dementia Mother        vascular dementia   Hyperlipidemia Mother    Depression Mother    Anxiety disorder Mother    Alcohol abuse Mother    Obesity Mother    Pulmonary fibrosis Father        d. 34   Depression Father    Anxiety disorder Father    Alcohol abuse Father    Heart attack Maternal Grandmother         d. 1-65y   Heart disease Maternal Grandmother    Heart disease Maternal Grandfather        d. 37-63   Leukemia Paternal Grandmother        d. late 97s   Breast cancer Sister 65       IDC s/p mastectomy and chemo    ROS: Review of Systems  Constitutional: Positive for weight loss.  Gastrointestinal: Negative for nausea and  vomiting.  Musculoskeletal:       Negative for muscle weakness    PHYSICAL EXAM: Blood pressure 137/86, pulse (!) 104, temperature 98.7 F (37.1 C), temperature source Oral, height 5\' 7"  (1.702 m), weight 250 lb (113.4 kg), SpO2 97 %. Body mass index is 39.16 kg/m. Physical Exam Vitals signs reviewed.  Constitutional:      Appearance: Normal appearance. She is well-developed. She is obese.  Cardiovascular:     Rate and Rhythm: Normal rate.  Pulmonary:     Effort: Pulmonary effort is normal.  Musculoskeletal: Normal range of motion.  Skin:    General: Skin is warm and dry.  Neurological:     Mental Status: She is alert and oriented to person, place, and time.  Psychiatric:        Mood and Affect: Mood normal.        Behavior: Behavior normal.     RECENT LABS AND TESTS: BMET    Component Value Date/Time   NA 140 02/04/2019 1121   NA 141 04/16/2017 1409   K 4.5 02/04/2019 1121   K 3.8 04/16/2017 1409   CL 100 02/04/2019 1121   CO2 24 02/04/2019 1121   CO2 25 04/16/2017 1409   GLUCOSE 107 (H) 02/04/2019 1121   GLUCOSE 79 11/11/2017 1127   GLUCOSE 134 04/16/2017 1409   BUN 14 02/04/2019 1121   BUN 12.4 04/16/2017 1409   CREATININE 0.82 02/04/2019 1121   CREATININE 0.9 04/16/2017 1409   CALCIUM 10.2 02/04/2019 1121   CALCIUM 9.6 04/16/2017 1409   GFRNONAA 80 02/04/2019 1121   GFRAA 92 02/04/2019 1121   Lab Results  Component Value Date   HGBA1C 6.2 (H) 02/04/2019   HGBA1C 5.9 (H) 08/06/2018   HGBA1C 5.9 (H) 02/18/2018   HGBA1C 6.2 (H) 09/17/2017   Lab Results  Component Value Date   INSULIN 23.8 02/04/2019   INSULIN 20.6  08/06/2018   INSULIN 15.2 02/18/2018   INSULIN 24.2 09/17/2017   CBC    Component Value Date/Time   WBC 6.8 11/11/2017 1127   RBC 5.02 11/11/2017 1127   HGB 14.2 11/11/2017 1127   HGB 13.7 04/16/2017 1409   HCT 42.2 11/11/2017 1127   HCT 41.2 04/16/2017 1409   PLT 248 11/11/2017 1127   PLT 259 04/16/2017 1409   MCV 84.1 11/11/2017 1127   MCV 84.4 04/16/2017 1409   MCH 28.2 11/11/2017 1127   MCHC 33.5 11/11/2017 1127   RDW 13.1 11/11/2017 1127   RDW 13.2 04/16/2017 1409   LYMPHSABS 1.7 11/11/2017 1127   LYMPHSABS 1.9 04/16/2017 1409   MONOABS 0.8 11/11/2017 1127   MONOABS 0.6 04/16/2017 1409   EOSABS 0.3 11/11/2017 1127   EOSABS 0.1 04/16/2017 1409   BASOSABS 0.0 11/11/2017 1127   BASOSABS 0.0 04/16/2017 1409   Iron/TIBC/Ferritin/ %Sat No results found for: IRON, TIBC, FERRITIN, IRONPCTSAT Lipid Panel     Component Value Date/Time   CHOL 179 02/04/2019 1121   TRIG 303 (H) 02/04/2019 1121   HDL 48 02/04/2019 1121   LDLCALC 70 02/04/2019 1121   Hepatic Function Panel     Component Value Date/Time   PROT 6.8 02/04/2019 1121   PROT 7.0 04/16/2017 1409   ALBUMIN 4.9 02/04/2019 1121   ALBUMIN 4.0 04/16/2017 1409   AST 20 02/04/2019 1121   AST 29 04/16/2017 1409   ALT 25 02/04/2019 1121   ALT 34 04/16/2017 1409   ALKPHOS 97 02/04/2019 1121   ALKPHOS 74 04/16/2017 1409   BILITOT  0.3 02/04/2019 1121   BILITOT 0.40 04/16/2017 1409      Component Value Date/Time   TSH 2.390 09/17/2017 1140     Ref. Range 02/04/2019 11:21  Vitamin D, 25-Hydroxy Latest Ref Range: 30.0 - 100.0 ng/mL 50.6    OBESITY BEHAVIORAL INTERVENTION VISIT  Today's visit was # 28  Starting weight: 249 lbs Starting date: 09/17/2017 Today's weight : 250 lbs Today's date: 03/04/2019 Total lbs lost to date: 0    03/04/2019  Height 5\' 7"  (1.702 m)  Weight 250 lb (113.4 kg)  BMI (Calculated) 39.15  BLOOD PRESSURE - SYSTOLIC 0000000  BLOOD PRESSURE - DIASTOLIC 86   Body Fat % 123456 %  Total Body  Water (lbs) 93.4 lbs    ASK: We discussed the diagnosis of obesity with Leland Johns today and Jamelle agreed to give [name] permission to discuss obesity behavioral modification therapy today.  ASSESS: Neala has the diagnosis of obesity and her BMI today is 39.15 Jourden is in the action stage of change   ADVISE: Quincee was educated on the multiple health risks of obesity as well as the benefit of weight loss to improve her health. She was advised of the need for long term treatment and the importance of lifestyle modifications to improve her current health and to decrease her risk of future health problems.  AGREE: Multiple dietary modification options and treatment options were discussed and  Yocheved agreed to follow the recommendations documented in the above note.  ARRANGE: Ellisyn was educated on the importance of frequent visits to treat obesity as outlined per CMS and USPSTF guidelines and agreed to schedule her next follow up appointment today.  Corey Skains, am acting as transcriptionist for [name], PA-CI, [name], PA-C have reviewed above note and agree with its content

## 2019-03-10 MED FILL — ROSUVASTATIN CALCIUM 10 MG: 10 | 90 days supply | Qty: 90 | Fill #2

## 2019-03-10 MED FILL — FLUoxetine HCL 10 MG CAPS: 10 | 90 days supply | Qty: 90 | Fill #1

## 2019-03-12 MED FILL — buPROPion HCL ER (XL) 300 M: 300 | 90 days supply | Qty: 90 | Fill #0

## 2019-03-18 ENCOUNTER — Encounter (INDEPENDENT_AMBULATORY_CARE_PROVIDER_SITE_OTHER): Payer: Self-pay | Admitting: Physician Assistant

## 2019-03-18 ENCOUNTER — Ambulatory Visit (INDEPENDENT_AMBULATORY_CARE_PROVIDER_SITE_OTHER): Payer: 59 | Admitting: Physician Assistant

## 2019-03-18 ENCOUNTER — Other Ambulatory Visit: Payer: Self-pay

## 2019-03-18 VITALS — BP 140/86 | HR 94 | Temp 98.0°F | Ht 67.0 in | Wt 249.0 lb

## 2019-03-18 DIAGNOSIS — E7849 Other hyperlipidemia: Secondary | ICD-10-CM | POA: Diagnosis not present

## 2019-03-18 DIAGNOSIS — Z9189 Other specified personal risk factors, not elsewhere classified: Secondary | ICD-10-CM | POA: Diagnosis not present

## 2019-03-18 DIAGNOSIS — Z6839 Body mass index (BMI) 39.0-39.9, adult: Secondary | ICD-10-CM | POA: Diagnosis not present

## 2019-03-18 DIAGNOSIS — R7303 Prediabetes: Secondary | ICD-10-CM | POA: Diagnosis not present

## 2019-03-18 DIAGNOSIS — E66812 Obesity, class 2: Secondary | ICD-10-CM

## 2019-03-18 MED ORDER — METFORMIN HCL 500 MG PO TABS: 500.0000 mg | ORAL_TABLET | Freq: Two times a day (BID) | ORAL | 0 refills | Status: DC

## 2019-03-22 MED FILL — metFORMIN HCL 500 MG TABS: 500 | 30 days supply | Qty: 60 | Fill #0

## 2019-03-23 NOTE — Progress Notes (Signed)
Office: [phone]  /  Fax: [phone]   HPI:   Chief Complaint: OBESITY Candace [name] is here to discuss her progress with her obesity treatment plan. She is on the Category 4 plan and is following her eating plan approximately 50 % of the time. She states she is walking 30 minutes 4 times per week. [name] reports that she had multiple celebrations and she did not eat on the plan. She is ready to get back on track. Her weight is 249 lb (112.9 kg) today and has had a weight loss of 1 pound over a period of 2 weeks since her last visit. She has lost 0 lbs since starting treatment with [name].  Pre-Diabetes [name] has a diagnosis of prediabetes based on her elevated Hgb A1c and was informed this puts her at greater risk of developing diabetes. [name] is on metformin and she denies nausea, vomiting or diarrhea. She continues to work on diet and exercise to decrease risk of diabetes. She denies polyphagia.  Hyperlipidemia [name] has hyperlipidemia and she is on Crestor. She has been trying to improve her cholesterol levels with intensive lifestyle modification including a low saturated fat diet, exercise and weight loss. She denies any chest pain.  At risk for cardiovascular disease [name] is at a higher than average risk for cardiovascular disease due to obesity, prediabetes and hyperlipidemia. She currently denies any chest pain.  ASSESSMENT AND PLAN:  Prediabetes - Plan: metFORMIN (GLUCOPHAGE) 500 MG tablet  Other hyperlipidemia  At risk for heart disease  Class 2 severe obesity with serious comorbidity and body mass index (BMI) of 39.0 to 39.9 in adult, unspecified obesity type ([place])  PLAN:  Pre-Diabetes [name] will continue to work on weight loss, exercise, and decreasing simple carbohydrates in her diet to help decrease the risk of diabetes. We dicussed metformin including benefits and risks. She was informed that eating too many simple carbohydrates or too many calories at one sitting  increases the likelihood of GI side effects. [name] agrees to continue metformin 500 mg tow times daily with a meal #60 with no refills and follow up with [name] as directed to monitor her progress.  Hyperlipidemia [name] was informed of the American Heart Association Guidelines emphasizing intensive lifestyle modifications as the first line treatment for hyperlipidemia. We discussed many lifestyle modifications today in depth, and [name] will continue to work on decreasing saturated fats such as fatty red meat, butter and many fried foods. She will also increase vegetables and lean protein in her diet and continue to work on exercise and weight loss efforts. [name] will continue Crestor and follow up as directed.  Cardiovascular risk counseling [name] was given extended (15 minutes) coronary artery disease prevention counseling today. She is 58 y.o. female and has risk factors for heart disease including obesity, prediabetes and hyperlipidemia. We discussed intensive lifestyle modifications today with an emphasis on specific weight loss instructions and strategies. Pt was also informed of the importance of increasing exercise and decreasing saturated fats to help prevent heart disease.  Obesity [name] is currently in the action stage of change. As such, her goal is to continue with weight loss efforts She has agreed to follow the Category 4 plan [name] has been instructed to work up to a goal of 150 minutes of combined cardio and strengthening exercise per week for weight loss and overall health benefits. We discussed the following Behavioral Modification Strategies today: no skipping meals and work on meal planning and easy cooking plans  [name] has agreed  to follow up with our clinic in 2 weeks. She was informed of the importance of frequent follow up visits to maximize her success with intensive lifestyle modifications for her multiple health conditions.  ALLERGIES: No Known  Allergies  MEDICATIONS: Current Outpatient Medications on File Prior to Visit  Medication Sig Dispense Refill   anastrozole (ARIMIDEX) 1 MG tablet Take 1 tablet (1 mg total) by mouth daily. 90 tablet 4   buPROPion (WELLBUTRIN XL) 300 MG 24 hr tablet Take 300 mg by mouth daily.      cetirizine (ZYRTEC) 10 MG tablet Take 10 mg by mouth daily. [facility]     Cholecalciferol (VITAMIN D-3) 125 MCG (5000 UT) TABS Take 5,000 Units by mouth daily. 30 tablet    esomeprazole (NEXIUM) 40 MG capsule Take 40 mg by mouth every 36 (thirty-six) hours.     FLUoxetine (PROZAC) 10 MG capsule Take 10 mg by mouth daily with lunch.      Multiple Vitamin (MULTIVITAMIN WITH MINERALS) TABS tablet Take 1 tablet by mouth daily.     rOPINIRole (REQUIP) 0.25 MG tablet Take 0.5 mg by mouth daily at 8 pm.     rosuvastatin (CRESTOR) 10 MG tablet Take 10 mg by mouth at bedtime.     No current facility-administered medications on file prior to visit.     PAST MEDICAL HISTORY: Past Medical History:  Diagnosis Date   Abnormal Pap smear    Alcohol abuse    Anovulation    Anxiety    Breast cancer ([place]) 04/05/2016   right breast   Cancer ([place]) 2017   right breast cancer   Complication of anesthesia    slow to wake up   Depression    Endometrial hyperplasia    Endometrial polyp    h/o   Epidermal cyst    h/o   Gallbladder problem    GERD (gastroesophageal reflux disease)    H/O hemorrhoids    H/O menorrhagia    High risk HPV infection    Hyperlipidemia    Insomnia    Obesity    Oligomenorrhea    Prediabetes    Restless legs syndrome    Seasonal allergies    Vitamin D deficiency     PAST SURGICAL HISTORY: Past Surgical History:  Procedure Laterality Date   BREAST LUMPECTOMY Right 05/2016   radiation   BREAST LUMPECTOMY WITH RADIOACTIVE SEED AND SENTINEL LYMPH NODE BIOPSY Right 05/28/2016   Procedure: RADIOACTIVE SEED GUIDED RIGHT BREAST  LUMPECTOMY WITH  RIGHT AXILLARY SENTINEL LYMPH NODE BIOPSY;  Surgeon: [name], MD;  Location: [facility];  Service: General;  Laterality: Right;   CARPAL TUNNEL RELEASE  03/26/2011   right hand   CARPAL TUNNEL RELEASE  05/28/2011   Procedure: CARPAL TUNNEL RELEASE;  Surgeon: [name];  Location: [facility];  Service: Orthopedics;  Laterality: Left;   CHOLECYSTECTOMY N/A 01/24/2013   Procedure: LAPAROSCOPIC CHOLECYSTECTOMY WITH INTRAOPERATIVE CHOLANGIOGRAM;  Surgeon: [name], MD;  Location: [facility];  Service: General;  Laterality: N/A;   COLONOSCOPY     HYSTEROSCOPY     [place]   as child   trigger fingers  8 yrs. ago   both done months apart   uterine ablation  06/04/2010   uterine ablation      SOCIAL HISTORY: Social History   Tobacco Use   Smoking status: Never Smoker   Smokeless tobacco: Never Used  Substance Use Topics   Alcohol use:  No    Comment: none since 1996   Drug use: No    FAMILY HISTORY: Family History  Problem Relation Age of Onset   Heart disease Mother    Hypertension Mother    Stroke Mother    Dementia Mother        vascular dementia   Hyperlipidemia Mother    Depression Mother    Anxiety disorder Mother    Alcohol abuse Mother    Obesity Mother    Pulmonary fibrosis Father        d. 7   Depression Father    Anxiety disorder Father    Alcohol abuse Father    Heart attack Maternal Grandmother        d. 69-65y   Heart disease Maternal Grandmother    Heart disease Maternal Grandfather        d. 51-63   Leukemia Paternal Grandmother        d. late 12s   Breast cancer Sister 38       IDC s/p mastectomy and chemo    ROS: Review of Systems  Constitutional: Positive for weight loss.  Cardiovascular: Negative for chest pain.  Gastrointestinal: Negative for diarrhea, nausea and vomiting.  Endo/Heme/Allergies:       Negative for polyphagia    PHYSICAL  EXAM: Blood pressure 140/86, pulse 94, temperature 98 F (36.7 C), temperature source Oral, height 5\' 7"  (1.702 m), weight 249 lb (112.9 kg), SpO2 97 %. Body mass index is 39 kg/m. Physical Exam Vitals signs reviewed.  Constitutional:      Appearance: Normal appearance. She is well-developed. She is obese.  Cardiovascular:     Rate and Rhythm: Normal rate.  Pulmonary:     Effort: Pulmonary effort is normal.  Musculoskeletal: Normal range of motion.  Skin:    General: Skin is warm and dry.  Neurological:     Mental Status: She is alert and oriented to person, place, and time.  Psychiatric:        Mood and Affect: Mood normal.        Behavior: Behavior normal.     RECENT LABS AND TESTS: BMET    Component Value Date/Time   NA 140 02/04/2019 1121   NA 141 04/16/2017 1409   K 4.5 02/04/2019 1121   K 3.8 04/16/2017 1409   CL 100 02/04/2019 1121   CO2 24 02/04/2019 1121   CO2 25 04/16/2017 1409   GLUCOSE 107 (H) 02/04/2019 1121   GLUCOSE 79 11/11/2017 1127   GLUCOSE 134 04/16/2017 1409   BUN 14 02/04/2019 1121   BUN 12.4 04/16/2017 1409   CREATININE 0.82 02/04/2019 1121   CREATININE 0.9 04/16/2017 1409   CALCIUM 10.2 02/04/2019 1121   CALCIUM 9.6 04/16/2017 1409   GFRNONAA 80 02/04/2019 1121   GFRAA 92 02/04/2019 1121   Lab Results  Component Value Date   HGBA1C 6.2 (H) 02/04/2019   HGBA1C 5.9 (H) 08/06/2018   HGBA1C 5.9 (H) 02/18/2018   HGBA1C 6.2 (H) 09/17/2017   Lab Results  Component Value Date   INSULIN 23.8 02/04/2019   INSULIN 20.6 08/06/2018   INSULIN 15.2 02/18/2018   INSULIN 24.2 09/17/2017   CBC    Component Value Date/Time   WBC 6.8 11/11/2017 1127   RBC 5.02 11/11/2017 1127   HGB 14.2 11/11/2017 1127   HGB 13.7 04/16/2017 1409   HCT 42.2 11/11/2017 1127   HCT 41.2 04/16/2017 1409   PLT 248 11/11/2017 1127   PLT 259 04/16/2017  1409   MCV 84.1 11/11/2017 1127   MCV 84.4 04/16/2017 1409   MCH 28.2 11/11/2017 1127   MCHC 33.5 11/11/2017  1127   RDW 13.1 11/11/2017 1127   RDW 13.2 04/16/2017 1409   LYMPHSABS 1.7 11/11/2017 1127   LYMPHSABS 1.9 04/16/2017 1409   MONOABS 0.8 11/11/2017 1127   MONOABS 0.6 04/16/2017 1409   EOSABS 0.3 11/11/2017 1127   EOSABS 0.1 04/16/2017 1409   BASOSABS 0.0 11/11/2017 1127   BASOSABS 0.0 04/16/2017 1409   Iron/TIBC/Ferritin/ %Sat No results found for: IRON, TIBC, FERRITIN, IRONPCTSAT Lipid Panel     Component Value Date/Time   CHOL 179 02/04/2019 1121   TRIG 303 (H) 02/04/2019 1121   HDL 48 02/04/2019 1121   LDLCALC 70 02/04/2019 1121   Hepatic Function Panel     Component Value Date/Time   PROT 6.8 02/04/2019 1121   PROT 7.0 04/16/2017 1409   ALBUMIN 4.9 02/04/2019 1121   ALBUMIN 4.0 04/16/2017 1409   AST 20 02/04/2019 1121   AST 29 04/16/2017 1409   ALT 25 02/04/2019 1121   ALT 34 04/16/2017 1409   ALKPHOS 97 02/04/2019 1121   ALKPHOS 74 04/16/2017 1409   BILITOT 0.3 02/04/2019 1121   BILITOT 0.40 04/16/2017 1409      Component Value Date/Time   TSH 2.390 09/17/2017 1140     Ref. Range 02/04/2019 11:21  Vitamin D, 25-Hydroxy Latest Ref Range: 30.0 - 100.0 ng/mL 50.6    OBESITY BEHAVIORAL INTERVENTION VISIT  Today's visit was # 29  Starting weight: 249 lbs Starting date: 09/17/2017 Today's weight : 249 lbs  Today's date: 03/18/2019 Total lbs lost to date: 0    03/18/2019  Height 5\' 7"  (1.702 m)  Weight 249 lb (112.9 kg)  BMI (Calculated) 38.99  BLOOD PRESSURE - SYSTOLIC [id number]  BLOOD PRESSURE - DIASTOLIC 86   Body Fat % 99991111 %  Total Body Water (lbs) 94 lbs    ASK: We discussed the diagnosis of obesity with Leland Johns today and Jeanita agreed to give [name] permission to discuss obesity behavioral modification therapy today.  ASSESS: Nusaybah has the diagnosis of obesity and her BMI today is 38.99 Rosy is in the action stage of change   ADVISE: Nikesha was educated on the multiple health risks of obesity as well as the benefit of weight loss to improve  her health. She was advised of the need for long term treatment and the importance of lifestyle modifications to improve her current health and to decrease her risk of future health problems.  AGREE: Multiple dietary modification options and treatment options were discussed and  Makeila agreed to follow the recommendations documented in the above note.  ARRANGE: Jora was educated on the importance of frequent visits to treat obesity as outlined per CMS and USPSTF guidelines and agreed to schedule her next follow up appointment today.  Corey Skains, am acting as transcriptionist for [name], PA-C I, [name], PA-C have reviewed above note and agree with its content

## 2019-03-25 ENCOUNTER — Other Ambulatory Visit (INDEPENDENT_AMBULATORY_CARE_PROVIDER_SITE_OTHER): Payer: Self-pay | Admitting: Physician Assistant

## 2019-03-25 DIAGNOSIS — R7303 Prediabetes: Secondary | ICD-10-CM

## 2019-03-25 MED FILL — ESOMEPRAZOLE MAG DR 40 MG C: 40 | 90 days supply | Qty: 90 | Fill #1

## 2019-04-01 ENCOUNTER — Ambulatory Visit (INDEPENDENT_AMBULATORY_CARE_PROVIDER_SITE_OTHER): Payer: 59 | Admitting: Physician Assistant

## 2019-04-01 ENCOUNTER — Other Ambulatory Visit: Payer: Self-pay

## 2019-04-01 ENCOUNTER — Encounter (INDEPENDENT_AMBULATORY_CARE_PROVIDER_SITE_OTHER): Payer: Self-pay | Admitting: Physician Assistant

## 2019-04-01 VITALS — BP 137/83 | HR 91 | Temp 97.8°F | Ht 67.0 in | Wt 247.0 lb

## 2019-04-01 DIAGNOSIS — E7849 Other hyperlipidemia: Secondary | ICD-10-CM | POA: Diagnosis not present

## 2019-04-01 DIAGNOSIS — Z6838 Body mass index (BMI) 38.0-38.9, adult: Secondary | ICD-10-CM | POA: Diagnosis not present

## 2019-04-05 NOTE — Progress Notes (Signed)
Office: [phone]  /  Fax: [phone]   HPI:   Chief Complaint: OBESITY Candace [name] is here to discuss her progress with her obesity treatment plan. She is on the Category 4 plan and is following her eating plan approximately 70 % of the time. She states she is walking 20 minutes 4 times per week. Candace [name] reports that she has done better with the plan over the last two weeks. She continue to eat potatoes intermittently. Her weight is 247 lb (112 kg) today and has had a weight loss of 2 pounds over a period of 2 weeks since her last visit. She has lost 2 lbs since starting treatment with [name].  Hyperlipidemia Kenny has hyperlipidemia and she is on Crestor. She has been trying to improve her cholesterol levels with intensive lifestyle modification including a low saturated fat diet, exercise and weight loss. She denies any chest pain.  ASSESSMENT AND PLAN:  Other hyperlipidemia  Class 2 severe obesity with serious comorbidity and body mass index (BMI) of 38.0 to 38.9 in adult, unspecified obesity type ([place])  PLAN:  Hyperlipidemia Candace [name] was informed of the American Heart Association Guidelines emphasizing intensive lifestyle modifications as the first line treatment for hyperlipidemia. We discussed many lifestyle modifications today in depth, and Natividad will continue to work on decreasing saturated fats such as fatty red meat, butter and many fried foods. She will also increase vegetables and lean protein in her diet and continue to work on exercise and weight loss efforts. Elsy will continue Crestor and follow up as directed.  I spent > than 50% of the 25 minute visit on counseling as documented in the note.  Obesity Candace [name] is currently in the action stage of change. As such, her goal is to continue with weight loss efforts She has agreed to follow the Category 4 plan Hema has been instructed to work up to a goal of 150 minutes of combined cardio and strengthening exercise per week  for weight loss and overall health benefits. We discussed the following Behavioral Modification Strategies today: keeping healthy foods in the home and work on meal planning and easy cooking plans  Candace [name] has agreed to follow up with our clinic in 2 weeks. She was informed of the importance of frequent follow up visits to maximize her success with intensive lifestyle modifications for her multiple health conditions.  I spent > than 50% of the 25 minute visit on counseling as documented in the note.    ALLERGIES: No Known Allergies  MEDICATIONS: Current Outpatient Medications on File Prior to Visit  Medication Sig Dispense Refill   anastrozole (ARIMIDEX) 1 MG tablet Take 1 tablet (1 mg total) by mouth daily. 90 tablet 4   buPROPion (WELLBUTRIN XL) 300 MG 24 hr tablet Take 300 mg by mouth daily.      cetirizine (ZYRTEC) 10 MG tablet Take 10 mg by mouth daily. [facility]     Cholecalciferol (VITAMIN D-3) 125 MCG (5000 UT) TABS Take 5,000 Units by mouth daily. 30 tablet    esomeprazole (NEXIUM) 40 MG capsule Take 40 mg by mouth every 36 (thirty-six) hours.     FLUoxetine (PROZAC) 10 MG capsule Take 10 mg by mouth daily with lunch.      metFORMIN (GLUCOPHAGE) 500 MG tablet Take 1 tablet (500 mg total) by mouth 2 (two) times daily with a meal. 60 tablet 0   Multiple Vitamin (MULTIVITAMIN WITH MINERALS) TABS tablet Take 1 tablet by mouth daily.     rOPINIRole (  REQUIP) 0.25 MG tablet Take 0.5 mg by mouth daily at 8 pm.     rosuvastatin (CRESTOR) 10 MG tablet Take 10 mg by mouth at bedtime.     No current facility-administered medications on file prior to visit.     PAST MEDICAL HISTORY: Past Medical History:  Diagnosis Date   Abnormal Pap smear    Alcohol abuse    Anovulation    Anxiety    Breast cancer ([place]) 04/05/2016   right breast   Cancer ([place]) 2017   right breast cancer   Complication of anesthesia    slow to wake up   Depression     Endometrial hyperplasia    Endometrial polyp    h/o   Epidermal cyst    h/o   Gallbladder problem    GERD (gastroesophageal reflux disease)    H/O hemorrhoids    H/O menorrhagia    High risk HPV infection    Hyperlipidemia    Insomnia    Obesity    Oligomenorrhea    Prediabetes    Restless legs syndrome    Seasonal allergies    Vitamin D deficiency     PAST SURGICAL HISTORY: Past Surgical History:  Procedure Laterality Date   BREAST LUMPECTOMY Right 05/2016   radiation   BREAST LUMPECTOMY WITH RADIOACTIVE SEED AND SENTINEL LYMPH NODE BIOPSY Right 05/28/2016   Procedure: RADIOACTIVE SEED GUIDED RIGHT BREAST LUMPECTOMY WITH  RIGHT AXILLARY SENTINEL LYMPH NODE BIOPSY;  Surgeon: [name], MD;  Location: [facility];  Service: General;  Laterality: Right;   CARPAL TUNNEL RELEASE  03/26/2011   right hand   CARPAL TUNNEL RELEASE  05/28/2011   Procedure: CARPAL TUNNEL RELEASE;  Surgeon: [name];  [facility];  Service: Orthopedics;  Laterality: Left;   CHOLECYSTECTOMY N/A 01/24/2013   Procedure: LAPAROSCOPIC CHOLECYSTECTOMY WITH INTRAOPERATIVE CHOLANGIOGRAM;  Surgeon: [name], MD;  Location: [facility];  Service: General;  Laterality: N/A;   COLONOSCOPY     HYSTEROSCOPY     [place]   as child   trigger fingers  8 yrs. ago   both done months apart   uterine ablation  06/04/2010   uterine ablation      SOCIAL HISTORY: Social History   Tobacco Use   Smoking status: Never Smoker   Smokeless tobacco: Never Used  Substance Use Topics   Alcohol use: No    Comment: none since 1996   Drug use: No    FAMILY HISTORY: Family History  Problem Relation Age of Onset   Heart disease Mother    Hypertension Mother    Stroke Mother    Dementia Mother        vascular dementia   Hyperlipidemia Mother    Depression Mother    Anxiety disorder Mother    Alcohol abuse Mother    Obesity  Mother    Pulmonary fibrosis Father        d. 71   Depression Father    Anxiety disorder Father    Alcohol abuse Father    Heart attack Maternal Grandmother        d. 80-65y   Heart disease Maternal Grandmother    Heart disease Maternal Grandfather        d. 70-63   Leukemia Paternal Grandmother        d. late 23s   Breast cancer Sister 29       IDC s/p mastectomy and chemo  ROS: Review of Systems  Constitutional: Positive for weight loss.  Cardiovascular: Negative for chest pain.    PHYSICAL EXAM: Blood pressure 137/83, pulse 91, temperature 97.8 F (36.6 C), temperature source Oral, height 5\' 7"  (1.702 m), weight 247 lb (112 kg), SpO2 96 %. Body mass index is 38.69 kg/m. Physical Exam Vitals signs reviewed.  Constitutional:      Appearance: Normal appearance. She is well-developed. She is obese.  Cardiovascular:     Rate and Rhythm: Normal rate.  Pulmonary:     Effort: Pulmonary effort is normal.  Musculoskeletal: Normal range of motion.  Skin:    General: Skin is warm and dry.  Neurological:     Mental Status: She is alert and oriented to person, place, and time.  Psychiatric:        Mood and Affect: Mood normal.        Behavior: Behavior normal.     RECENT LABS AND TESTS: BMET    Component Value Date/Time   NA 140 02/04/2019 1121   NA 141 04/16/2017 1409   K 4.5 02/04/2019 1121   K 3.8 04/16/2017 1409   CL 100 02/04/2019 1121   CO2 24 02/04/2019 1121   CO2 25 04/16/2017 1409   GLUCOSE 107 (H) 02/04/2019 1121   GLUCOSE 79 11/11/2017 1127   GLUCOSE 134 04/16/2017 1409   BUN 14 02/04/2019 1121   BUN 12.4 04/16/2017 1409   CREATININE 0.82 02/04/2019 1121   CREATININE 0.9 04/16/2017 1409   CALCIUM 10.2 02/04/2019 1121   CALCIUM 9.6 04/16/2017 1409   GFRNONAA 80 02/04/2019 1121   GFRAA 92 02/04/2019 1121   Lab Results  Component Value Date   HGBA1C 6.2 (H) 02/04/2019   HGBA1C 5.9 (H) 08/06/2018   HGBA1C 5.9 (H) 02/18/2018   HGBA1C  6.2 (H) 09/17/2017   Lab Results  Component Value Date   INSULIN 23.8 02/04/2019   INSULIN 20.6 08/06/2018   INSULIN 15.2 02/18/2018   INSULIN 24.2 09/17/2017   CBC    Component Value Date/Time   WBC 6.8 11/11/2017 1127   RBC 5.02 11/11/2017 1127   HGB 14.2 11/11/2017 1127   HGB 13.7 04/16/2017 1409   HCT 42.2 11/11/2017 1127   HCT 41.2 04/16/2017 1409   PLT 248 11/11/2017 1127   PLT 259 04/16/2017 1409   MCV 84.1 11/11/2017 1127   MCV 84.4 04/16/2017 1409   MCH 28.2 11/11/2017 1127   MCHC 33.5 11/11/2017 1127   RDW 13.1 11/11/2017 1127   RDW 13.2 04/16/2017 1409   LYMPHSABS 1.7 11/11/2017 1127   LYMPHSABS 1.9 04/16/2017 1409   MONOABS 0.8 11/11/2017 1127   MONOABS 0.6 04/16/2017 1409   EOSABS 0.3 11/11/2017 1127   EOSABS 0.1 04/16/2017 1409   BASOSABS 0.0 11/11/2017 1127   BASOSABS 0.0 04/16/2017 1409   Iron/TIBC/Ferritin/ %Sat No results found for: IRON, TIBC, FERRITIN, IRONPCTSAT Lipid Panel     Component Value Date/Time   CHOL 179 02/04/2019 1121   TRIG 303 (H) 02/04/2019 1121   HDL 48 02/04/2019 1121   LDLCALC 70 02/04/2019 1121   Hepatic Function Panel     Component Value Date/Time   PROT 6.8 02/04/2019 1121   PROT 7.0 04/16/2017 1409   ALBUMIN 4.9 02/04/2019 1121   ALBUMIN 4.0 04/16/2017 1409   AST 20 02/04/2019 1121   AST 29 04/16/2017 1409   ALT 25 02/04/2019 1121   ALT 34 04/16/2017 1409   ALKPHOS 97 02/04/2019 1121   ALKPHOS 74 04/16/2017 1409  BILITOT 0.3 02/04/2019 1121   BILITOT 0.40 04/16/2017 1409      Component Value Date/Time   TSH 2.390 09/17/2017 1140     Ref. Range 02/04/2019 11:21  Vitamin D, 25-Hydroxy Latest Ref Range: 30.0 - 100.0 ng/mL 50.6    OBESITY BEHAVIORAL INTERVENTION VISIT  Today's visit was # 30  Starting weight: 249 lbs Starting date: 09/17/2017 Today's weight : 247 lbs Today's date: 04/01/2019 Total lbs lost to date: 2    04/01/2019  Height 5\' 7"  (1.702 m)  Weight 247 lb (112 kg)  BMI (Calculated)  38.68  BLOOD PRESSURE - SYSTOLIC 0000000  BLOOD PRESSURE - DIASTOLIC 83   Body Fat % 99991111 %  Total Body Water (lbs) 92 lbs    ASK: We discussed the diagnosis of obesity with Leland Johns today and Janesa agreed to give [name] permission to discuss obesity behavioral modification therapy today.  ASSESS: Goodness has the diagnosis of obesity and her BMI today is 38.68 Wynnie is in the action stage of change   ADVISE: Daneille was educated on the multiple health risks of obesity as well as the benefit of weight loss to improve her health. She was advised of the need for long term treatment and the importance of lifestyle modifications to improve her current health and to decrease her risk of future health problems.  AGREE: Multiple dietary modification options and treatment options were discussed and  Trinia agreed to follow the recommendations documented in the above note.  ARRANGE: Christee was educated on the importance of frequent visits to treat obesity as outlined per CMS and USPSTF guidelines and agreed to schedule her next follow up appointment today.  Corey Skains, am acting as transcriptionist for [name], PA-C I, [name], PA-C have reviewed above note and agree with its content

## 2019-04-08 ENCOUNTER — Ambulatory Visit
Admission: RE | Admit: 2019-04-08 | Discharge: 2019-04-08 | Disposition: A | Discharge status: Home / Self Care | Payer: 59 | Source: Ambulatory Visit | Attending: Oncology | Admitting: Oncology

## 2019-04-08 ENCOUNTER — Other Ambulatory Visit: Payer: Self-pay

## 2019-04-08 DIAGNOSIS — C50411 Malignant neoplasm of upper-outer quadrant of right female breast: Secondary | ICD-10-CM

## 2019-04-08 DIAGNOSIS — R928 Other abnormal and inconclusive findings on diagnostic imaging of breast: Secondary | ICD-10-CM | POA: Diagnosis not present

## 2019-04-08 DIAGNOSIS — Z780 Asymptomatic menopausal state: Secondary | ICD-10-CM | POA: Diagnosis not present

## 2019-04-08 DIAGNOSIS — Z170 Estrogen receptor positive status [ER+]: Secondary | ICD-10-CM

## 2019-04-08 DIAGNOSIS — Z13820 Encounter for screening for osteoporosis: Secondary | ICD-10-CM | POA: Diagnosis not present

## 2019-04-08 MED FILL — rOPINIRole HCL 0.5 MG TABS: 0.5 | 90 days supply | Qty: 90 | Fill #1

## 2019-04-08 MED FILL — ANASTROZOLE 1 MG TABLET: 1 | 90 days supply | Qty: 90 | Fill #1

## 2019-04-14 ENCOUNTER — Other Ambulatory Visit: Payer: Self-pay

## 2019-04-14 DIAGNOSIS — C50411 Malignant neoplasm of upper-outer quadrant of right female breast: Secondary | ICD-10-CM

## 2019-04-14 DIAGNOSIS — Z170 Estrogen receptor positive status [ER+]: Secondary | ICD-10-CM

## 2019-04-15 ENCOUNTER — Inpatient Hospital Stay: Payer: 59 | Attending: Oncology

## 2019-04-15 ENCOUNTER — Inpatient Hospital Stay (HOSPITAL_BASED_OUTPATIENT_CLINIC_OR_DEPARTMENT_OTHER): Payer: 59 | Admitting: Oncology

## 2019-04-15 ENCOUNTER — Other Ambulatory Visit: Payer: Self-pay

## 2019-04-15 VITALS — BP 150/86 | HR 97 | Temp 97.3°F | Resp 18 | Ht 67.0 in | Wt 251.5 lb

## 2019-04-15 DIAGNOSIS — E119 Type 2 diabetes mellitus without complications: Secondary | ICD-10-CM | POA: Insufficient documentation

## 2019-04-15 DIAGNOSIS — E785 Hyperlipidemia, unspecified: Secondary | ICD-10-CM | POA: Insufficient documentation

## 2019-04-15 DIAGNOSIS — Z7984 Long term (current) use of oral hypoglycemic drugs: Secondary | ICD-10-CM | POA: Diagnosis not present

## 2019-04-15 DIAGNOSIS — C50411 Malignant neoplasm of upper-outer quadrant of right female breast: Secondary | ICD-10-CM | POA: Diagnosis not present

## 2019-04-15 DIAGNOSIS — Z923 Personal history of irradiation: Secondary | ICD-10-CM | POA: Insufficient documentation

## 2019-04-15 DIAGNOSIS — Z79811 Long term (current) use of aromatase inhibitors: Secondary | ICD-10-CM | POA: Insufficient documentation

## 2019-04-15 DIAGNOSIS — K219 Gastro-esophageal reflux disease without esophagitis: Secondary | ICD-10-CM | POA: Insufficient documentation

## 2019-04-15 DIAGNOSIS — Z170 Estrogen receptor positive status [ER+]: Secondary | ICD-10-CM | POA: Insufficient documentation

## 2019-04-15 DIAGNOSIS — Z79899 Other long term (current) drug therapy: Secondary | ICD-10-CM | POA: Insufficient documentation

## 2019-04-15 DIAGNOSIS — Z803 Family history of malignant neoplasm of breast: Secondary | ICD-10-CM | POA: Diagnosis not present

## 2019-04-15 DIAGNOSIS — F329 Major depressive disorder, single episode, unspecified: Secondary | ICD-10-CM | POA: Insufficient documentation

## 2019-04-15 DIAGNOSIS — F419 Anxiety disorder, unspecified: Secondary | ICD-10-CM | POA: Insufficient documentation

## 2019-04-15 LAB — CMP (CANCER CENTER ONLY)
ALT: 19 U/L (ref 0–44)
AST: 17 U/L (ref 15–41)
Albumin: 4 g/dL (ref 3.5–5.0)
Alkaline Phosphatase: 100 U/L (ref 38–126)
Anion gap: 12 (ref 5–15)
BUN: 17 mg/dL (ref 6–20)
CO2: 24 mmol/L (ref 22–32)
Calcium: 9.9 mg/dL (ref 8.9–10.3)
Chloride: 105 mmol/L (ref 98–111)
Creatinine: 0.87 mg/dL (ref 0.44–1.00)
GFR, Est AFR Am: >60 mL/min (ref >60)
GFR, Estimated: >60 mL/min (ref >60)
Glucose, Bld: 120 mg/dL — ABNORMAL HIGH (ref 70–99)
Potassium: 4.1 mmol/L (ref 3.5–5.1)
Sodium: 141 mmol/L (ref 135–145)
Total Bilirubin: 0.3 mg/dL (ref 0.3–1.2)
Total Protein: 7 g/dL (ref 6.5–8.1)

## 2019-04-15 LAB — CBC WITH DIFFERENTIAL (CANCER CENTER ONLY)
Abs Immature Granulocytes: 0.02 10*3/uL (ref 0.00–0.07)
Basophils Absolute: 0 10*3/uL (ref 0.0–0.1)
Basophils Relative: 0 %
Eosinophils Absolute: 0.2 10*3/uL (ref 0.0–0.5)
Eosinophils Relative: 3 %
HCT: 41.4 % (ref 36.0–46.0)
Hemoglobin: 13.4 g/dL (ref 12.0–15.0)
Immature Granulocytes: 0 %
Lymphocytes Relative: 29 %
Lymphs Abs: 2.1 10*3/uL (ref 0.7–4.0)
MCH: 27.1 pg (ref 26.0–34.0)
MCHC: 32.4 g/dL (ref 30.0–36.0)
MCV: 83.8 fL (ref 80.0–100.0)
Monocytes Absolute: 0.8 10*3/uL (ref 0.1–1.0)
Monocytes Relative: 11 %
Neutro Abs: 4 10*3/uL (ref 1.7–7.7)
Neutrophils Relative %: 57 %
Platelet Count: 297 10*3/uL (ref 150–400)
RBC: 4.94 MIL/uL (ref 3.87–5.11)
RDW: 12.6 % (ref 11.5–15.5)
WBC Count: 7.1 10*3/uL (ref 4.0–10.5)
nRBC: 0 % (ref 0.0–0.2)

## 2019-04-15 MED ORDER — ANASTROZOLE 1 MG PO TABS: 1.0000 mg | ORAL_TABLET | Freq: Every day | ORAL | 4 refills | Status: DC

## 2019-04-15 NOTE — Progress Notes (Signed)
[place]  [phone] [phone]     ID: Candace [name] DOB: [birth year]  MR#: [id number]  HYQ#:[id number]  Patient Care Team: [name], MD as PCP - General (Family Medicine) Magrinat, Virgie Dad, MD as Consulting Physician (Oncology) [name], MD as Consulting Physician (General Surgery) Haygood, Seymour Bars, MD (Inactive) as Consulting Physician (Obstetrics and Gynecology) Ronald Lobo, MD as Consulting Physician (Gastroenterology) Danella Sensing, MD as Consulting Physician (Dermatology) Delice Bison, Charlestine Massed, NP as Nurse Practitioner (Hematology and Oncology) OTHER MD:   CHIEF COMPLAINT: Estrogen receptor positive breast cancer   CURRENT TREATMENT: Anastrozole   INTERVAL HISTORY: Candace [name] returns today for follow-up and treatment of her estrogen receptor positive breast cancer. She was last seen here on 11/13/2018.   She continues on anastrozole.  She tolerates this well.  She does not have problems with hot flashes or vaginal dryness.  She obtains it under good price.  Candace [name]'s last bone density screening on 04/08/2019, showed a T-score of 0.3, which is considered Normal.    Since her last visit here, she underwent a digital diagnostic bilateral mammogram with tomography on 04/08/2019 showing: Breast Density Category B. There is no mammographic evidence of malignancy.   REVIEW OF SYSTEMS: Candace [name] has been working extra hours and this has been difficult for her family.  Of course she has 2 teenage daughters at home.  Her former husband apparently has not wearing been wearing a mask and this is a source of concern as he could contaminate the daughters when he sees them.  She participated in the health and wellness program and lost quite a bit of weight and then gained it back when the program went virtual.  She is now "back on the wagon".  She generally feels "great".  Detailed review of systems today was otherwise benign.     BREAST CANCER HISTORY: From the original intake note:  Candace [name] had routine screening mammography with tomography at the [facility] 03/29/2016. On 04/04/2016 she underwent right diagnostic mammography and tomography with right breast ultrasonography. The breast density was category B. In the upper outer quadrant of the breast there was a mass measuring 0.7 cm, with irregular margins. By ultrasonography this was located at the 10:00 position 9 cm from the nipple measuring 0.6 cm. Right axillary ultrasound was negative.  Biopsy of the right breast mass in question 04/05/2016 showed ([id number]) and invasive lobular carcinoma, grade 2 or 3, E-cadherin negative, estrogen receptor 90% positive, progesterone receptor 5% positive, both with strong staining intensity, with an MIB-1 of 20%, and no HER-2 amplification, the signals ratio being 0.82 and the number per cell 1.60.  Bilateral breast MRI was obtained 04/23/2016. This confirmed a 0.6 cm nodule in the upper-outer quadrant of the right breast with no additional sites of concern in either breast and no abnormal appearing lymph nodes.  Her subsequent history is as detailed below.   PAST MEDICAL HISTORY: Past Medical History:  Diagnosis Date  . Abnormal Pap smear   . Alcohol abuse   . Anovulation   . Anxiety   . Breast cancer ([place]) 04/05/2016   right breast  . Cancer ([place]) 2017   right breast cancer  . Complication of anesthesia    slow to wake up  . Depression   . Endometrial hyperplasia   . Endometrial polyp    h/o  . Epidermal cyst    h/o  . Gallbladder problem   . GERD (gastroesophageal reflux disease)   . H/O hemorrhoids   .  H/O menorrhagia   . High risk HPV infection   . Hyperlipidemia   . Insomnia   . Obesity   . Oligomenorrhea   . Prediabetes   . Restless legs syndrome   . Seasonal allergies   . Vitamin D deficiency     PAST SURGICAL HISTORY: Past Surgical History:  Procedure Laterality Date  . BREAST  LUMPECTOMY Right 05/2016   radiation  . BREAST LUMPECTOMY WITH RADIOACTIVE SEED AND SENTINEL LYMPH NODE BIOPSY Right 05/28/2016   Procedure: RADIOACTIVE SEED GUIDED RIGHT BREAST LUMPECTOMY WITH  RIGHT AXILLARY SENTINEL LYMPH NODE BIOPSY;  Surgeon: [name], MD;  Location: [place] [facility];  Service: General;  Laterality: Right;  . CARPAL TUNNEL RELEASE  03/26/2011   right hand  . CARPAL TUNNEL RELEASE  05/28/2011   Procedure: CARPAL TUNNEL RELEASE;  Surgeon: [name];  Location: [facility];  Service: Orthopedics;  Laterality: Left;  . CHOLECYSTECTOMY N/A 01/24/2013   Procedure: LAPAROSCOPIC CHOLECYSTECTOMY WITH INTRAOPERATIVE CHOLANGIOGRAM;  Surgeon: [name], MD;  Location: [place];  Service: General;  Laterality: N/A;  . COLONOSCOPY    . HYSTEROSCOPY    . POLYPECTOMY    . TONSILLECTOMY  1967   as child  . trigger fingers  8 yrs. ago   both done months apart  . uterine ablation  06/04/2010  . uterine ablation      FAMILY HISTORY Family History  Problem Relation Age of Onset  . Heart disease Mother   . Hypertension Mother   . Stroke Mother   . Dementia Mother        vascular dementia  . Hyperlipidemia Mother   . Depression Mother   . Anxiety disorder Mother   . Alcohol abuse Mother   . Obesity Mother   . Pulmonary fibrosis Father        d. 56  . Depression Father   . Anxiety disorder Father   . Alcohol abuse Father   . Heart attack Maternal Grandmother        d. [phone]  . Heart disease Maternal Grandmother   . Heart disease Maternal Grandfather        d. 60-63  . Leukemia Paternal Grandmother        d. late 80s  . Breast cancer Sister 22       IDC s/p mastectomy and chemo  Both of the patient's parents were only children. Her father died at age 68 from idiopathic pulmonary fibrosis. The patient's mother died at the age of 7 in the setting of dementia. The patient has one brother and one sister. Her sister was diagnosed at age 67 with breast cancer (a T1b  invasive ductal carcinoma just behind the nipple, requiring mastectomy. Her Oncotype was 24, and she received chemotherapy 3. She is now doing well).  GYNECOLOGIC HISTORY:  No LMP recorded. Patient has had an ablation. Menarche age 9, first live birth age ? She is GX P2. She stopped having periods December 2012 when she underwent endometrial ablation. She used hormone replacement for more than 10 years remotely, without complications  SOCIAL HISTORY: (Updated May 2020). 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 She is divorced and lives at home with her daughters  Her children 46, age 76, and [name], age 67, are doing well with online schoolwork. she had 2 dogs. The patient was brought up as an [employer].    ADVANCED DIRECTIVES: In place. The patient's sister Halford Decamp is her healthcare part  of attorney   HEALTH MAINTENANCE: Social History   Tobacco Use  . Smoking status: Never Smoker  . Smokeless tobacco: Never Used  Substance Use Topics  . Alcohol use: No    Comment: none since 1996  . Drug use: No     Colonoscopy:  PAP:  Bone density: 03/28/2017 showed a T score of 0.8 normal   No Known Allergies  Current Outpatient Medications  Medication Sig Dispense Refill  . anastrozole (ARIMIDEX) 1 MG tablet Take 1 tablet (1 mg total) by mouth daily. 90 tablet 4  . buPROPion (WELLBUTRIN XL) 300 MG 24 hr tablet Take 300 mg by mouth daily.     . cetirizine (ZYRTEC) 10 MG tablet Take 10 mg by mouth daily. [facility]    . Cholecalciferol (VITAMIN D-3) 125 MCG (5000 UT) TABS Take 5,000 Units by mouth daily. 30 tablet   . esomeprazole (NEXIUM) 40 MG capsule Take 40 mg by mouth every 36 (thirty-six) hours.    [name] FLUoxetine (PROZAC) 10 MG capsule Take 10 mg by mouth daily with lunch.     . metFORMIN (GLUCOPHAGE) 500 MG tablet Take 1 tablet (500 mg total) by mouth 2 (two) times daily with a meal. 60 tablet 0  .  Multiple Vitamin (MULTIVITAMIN WITH MINERALS) TABS tablet Take 1 tablet by mouth daily.    [name] rOPINIRole (REQUIP) 0.25 MG tablet Take 0.5 mg by mouth daily at 8 pm.    . rosuvastatin (CRESTOR) 10 MG tablet Take 10 mg by mouth at bedtime.     No current facility-administered medications for this visit.     OBJECTIVE:Middle-aged white woman who appears well  Vitals:   04/15/19 1001  BP: (!) 150/86  Pulse: 97  Resp: 18  Temp: (!) 97.3 F (36.3 C)  SpO2: 97%   Wt Readings from Last 3 Encounters:  04/15/19 251 lb 8 oz (114.1 kg)  04/01/19 247 lb (112 kg)  03/18/19 249 lb (112.9 kg)   Body mass index is 39.39 kg/m.    ECOG FS:1 - Symptomatic but completely ambulatory  Ocular: Sclerae unicteric, pupils round and equal Ear-nose-throat: Wearing a mask Lymphatic: No cervical or supraclavicular adenopathy Lungs no rales or rhonchi Heart regular rate and rhythm Abd soft, nontender, positive bowel sounds MSK no focal spinal tenderness, no joint edema Neuro: non-focal, well-oriented, appropriate affect Breasts: The right breast is status post lumpectomy and radiation.  There is no evidence of local recurrence.  The left breast is benign.  Both axillae are benign.   LAB RESULTS:  CMP     Component Value Date/Time   NA 140 02/04/2019 1121   NA 141 04/16/2017 1409   K 4.5 02/04/2019 1121   K 3.8 04/16/2017 1409   CL 100 02/04/2019 1121   CO2 24 02/04/2019 1121   CO2 25 04/16/2017 1409   GLUCOSE 107 (H) 02/04/2019 1121   GLUCOSE 79 11/11/2017 1127   GLUCOSE 134 04/16/2017 1409   BUN 14 02/04/2019 1121   BUN 12.4 04/16/2017 1409   CREATININE 0.82 02/04/2019 1121   CREATININE 0.9 04/16/2017 1409   CALCIUM 10.2 02/04/2019 1121   CALCIUM 9.6 04/16/2017 1409   PROT 6.8 02/04/2019 1121   PROT 7.0 04/16/2017 1409   ALBUMIN 4.9 02/04/2019 1121   ALBUMIN 4.0 04/16/2017 1409   AST 20 02/04/2019 1121   AST 29 04/16/2017 1409   ALT 25 02/04/2019 1121   ALT 34 04/16/2017 1409    ALKPHOS 97 02/04/2019 1121   ALKPHOS  74 04/16/2017 1409   BILITOT 0.3 02/04/2019 1121   BILITOT 0.40 04/16/2017 1409   GFRNONAA 80 02/04/2019 1121   GFRAA 92 02/04/2019 1121    INo results found for: SPEP, UPEP  Lab Results  Component Value Date   WBC 7.1 04/15/2019   NEUTROABS 4.0 04/15/2019   HGB 13.4 04/15/2019   HCT 41.4 04/15/2019   MCV 83.8 04/15/2019   PLT 297 04/15/2019      Chemistry      Component Value Date/Time   NA 140 02/04/2019 1121   NA 141 04/16/2017 1409   K 4.5 02/04/2019 1121   K 3.8 04/16/2017 1409   CL 100 02/04/2019 1121   CO2 24 02/04/2019 1121   CO2 25 04/16/2017 1409   BUN 14 02/04/2019 1121   BUN 12.4 04/16/2017 1409   CREATININE 0.82 02/04/2019 1121   CREATININE 0.9 04/16/2017 1409      Component Value Date/Time   CALCIUM 10.2 02/04/2019 1121   CALCIUM 9.6 04/16/2017 1409   ALKPHOS 97 02/04/2019 1121   ALKPHOS 74 04/16/2017 1409   AST 20 02/04/2019 1121   AST 29 04/16/2017 1409   ALT 25 02/04/2019 1121   ALT 34 04/16/2017 1409   BILITOT 0.3 02/04/2019 1121   BILITOT 0.40 04/16/2017 1409       No results found for: LABCA2  No components found for: LABCA125  No results for input(s): INR in the last 168 hours.  Urinalysis    Component Value Date/Time   COLORURINE YELLOW 01/24/2013 0327   APPEARANCEUR TURBID (A) 01/24/2013 0327   LABSPEC 1.021 01/24/2013 0327   PHURINE 7.5 01/24/2013 0327   GLUCOSEU NEGATIVE 01/24/2013 0327   HGBUR NEGATIVE 01/24/2013 0327   BILIRUBINUR NEGATIVE 01/24/2013 0327   KETONESUR 15 (A) 01/24/2013 0327   PROTEINUR NEGATIVE 01/24/2013 0327   UROBILINOGEN 0.2 01/24/2013 0327   NITRITE NEGATIVE 01/24/2013 0327   LEUKOCYTESUR NEGATIVE 01/24/2013 0327     STUDIES: Dg Bone Density  Result Date: 04/08/2019 EXAM: DUAL X-RAY ABSORPTIOMETRY (DXA) FOR BONE MINERAL DENSITY IMPRESSION: Referring Physician:  Chauncey Cruel Your patient completed a BMD test using Lunar IDXA DXA system ( analysis  version: 16 ) manufactured by [employer]. Technologist: CG PATIENT: Name: Candace [name], Candace [name] Patient ID: [id number] Birth Date: February 04, 1961 Height: 67.0 in. Sex: Female Measured: 04/08/2019 Weight: 252.4 lbs. Indications: Anastrazole, Breast Cancer History, Caucasian, Estrogen Deficient, Nexium, Postmenopausal, Prozac, Wellbutrin Fractures: None Treatments: Calcium (E943.0), Hormone Therapy For Cancer, Vitamin D (E933.5) ASSESSMENT: The BMD measured at Femur Neck Right is 1.086 g/cm2 with a T-score of 0.3. This patient is considered NORMAL according to [facility]) criteria. There has been a statistically significant decrease in BMD of Lumbar spine, Right hip, and Total Mean since prior exam dated 03/28/2017. The scan quality is good. Site Region Measured Date Measured Age YA T-score BMD Significant CHANGE AP Spine L1-L4 04/08/2019 57.9 0.5 1.260 g/cm2 * AP Spine  L1-L4      03/28/2017    55.9         1.4     1.364 g/cm2 DualFemur Neck Right 04/08/2019 57.9 0.3 1.086 g/cm2 * DualFemur Neck Right 3/28/2017    55.9         0.8     1.150 g/cm2 DualFemur Total Mean 04/08/2019 57.9 1.4 1.189 g/cm2 * DualFemur Total Mean 3/28/2017    55.9         1.8     1.236 g/cm2 World Health Organization [facility]) criteria  for post-menopausal, Caucasian Women: Normal       T-score at or above -1 SD Osteopenia   T-score between -1 and -2.5 SD Osteoporosis T-score at or below -2.5 SD RECOMMENDATION: 1. All patients should optimize calcium and vitamin D intake. 2. Consider FDA approved medical therapies in postmenopausal women and men aged 58 years and older, based on the following: a. A hip or vertebral (clinical or morphometric) fracture b. T- score < or = -2.5 at the femoral neck or spine after appropriate evaluation to exclude secondary causes c. Low bone mass (T-score between -1.0 and -2.5 at the femoral neck or spine) and a 10 year probability of a hip fracture > or = 3% or a 10 year probability of a major  osteoporosis-related fracture > or = 20% based on the [place]-adapted WHO algorithm d. Clinician judgment and/or patient preferences may indicate treatment for people with 10-year fracture probabilities above or below these levels FOLLOW-UP: Patients with diagnosis of osteoporosis or at high risk for fracture should have regular bone mineral density tests. For patients eligible for Medicare, routine testing is allowed once every 2 years. The testing frequency can be increased to one year for patients who have rapidly progressing disease, those who are receiving or discontinuing medical therapy to restore bone mass, or have additional risk factors. I have reviewed this report and agree with the above findings. Mark A. Thornton Papas, M.D. [facility] Radiology Electronically Signed   By: Lavonia Dana M.D.   On: 04/08/2019 09:52   Mm Diag Breast Tomo Bilateral  Result Date: 04/08/2019 CLINICAL DATA:  58 year old who underwent malignant lumpectomy of the UPPER OUTER QUADRANT of the RIGHT breast in 2017 with adjuvant radiation therapy.Patient currently undergoing hormonal chemoprevention with anastrozole. Annual evaluation. EXAM: DIGITAL DIAGNOSTIC BILATERAL MAMMOGRAM WITH CAD AND TOMO COMPARISON:  Previous exam(s). ACR Breast Density Category b: There are scattered areas of fibroglandular density. FINDINGS: Tomosynthesis and synthesized full field CC and MLO views of both breasts were obtained. Standard spot magnification MLO view of the lumpectomy site in the RIGHT breast and a tomosynthesis and synthesized spot-compression MLO view of the lumpectomy site in the RIGHT breast were obtained. Post surgical scar/architectural distortion at the lumpectomy site in the UPPER OUTER RIGHT breast at POSTERIOR depth. Circumscribed low-density masses with central fat projected over the lumpectomy site, more conspicuous than on the spot magnification view from September, 2018. Spot compression tomosynthesis images confirm that these represent  benign lymph nodes. No suspicious findings in the RIGHT breast. No findings suspicious for malignancy in the LEFT breast. Mammographic images were processed with CAD. IMPRESSION: 1. No mammographic evidence of malignancy involving either breast. 2. Expected post lumpectomy changes involving the RIGHT breast. RECOMMENDATION: BILATERAL diagnostic mammography in 1 year. I have discussed the findings and recommendations with the patient. If applicable, a reminder letter will be sent to the patient regarding the next appointment. BI-RADS CATEGORY  2: Benign. Electronically Signed   By: Evangeline Dakin M.D.   On: 04/08/2019 09:16     ELIGIBLE FOR AVAILABLE RESEARCH PROTOCOL: no  ASSESSMENT: 58 y.o. BRCA negative [place] woman status post right breast upper outer quadrant biopsy 04/05/2016 for a clinical T1b N0, stage IA invasive lobular breast cancer, grade 2 or 3, estrogen and progesterone receptor positive, HER-2 not amplified, with an MIB-1 of 20%  (1) genetics testing 04/23/2016 through the [place] offered by [facility] Hope Pigeon, MD)  found no deleterious mutations in  ATM, BARD1, BRCA1, BRCA2, BRIP1, CDH1, CHEK2,  FANCC, MLH1, MSH2, MSH6, NBN, PALB2, PMS2, PTEN, RAD51C, RAD51D, TP53, and XRCC2.  This panel also includes deletion/duplication analysis (without sequencing) for one gene, EPCAM  (2) status post right lumpectomy and sentinel lymph node sampling 05/28/2016 for a pT1b pN0, stage IA invasive lobular breast cancer, grade 2, with negative margins  (3) Oncotype Dx score of 22 predicts a 10 year risk of recurrence outside the breast of 14% if the patient's only systemic therapy is tamoxifen for 5 years. It predicts a benefit from chemotherapy in the 4% range.   (a) Patient opted against adjuvant chemotherapy   (4) adjuvant radiation 07/22/16-09/04/16 Site/dose:   1) Right breast/ 50.4 Gy in 28 fractions                         2) Right breast boost/ 10 Gy  in 5 fractions  (5) started anastrozole 09/29/2016   (a) bone density 03/28/2017 at the [facility] Center showed a T score of 0.8, normal   (b) repeat bone density 04/08/2019 showed a T score of 0.3 (normal).  PLAN: Candace [name] is now just about 3 years out from definitive surgery for her breast cancer with no evidence of disease recurrence.  This is very favorable.  She is tolerating anastrozole well and the plan is to continue that to a total of 5 years.  We reviewed her bone density which is normal and she understands how we read the T score.  She has known dense breasts which is very favorable.  She is taking appropriate pandemic precautions.  She will see me again in 1 year and she knows to call for any other issues that may develop before the next visit.  Magrinat, Virgie Dad, MD  04/15/19 10:29 AM Medical Oncology and Hematology [facility] [place], [place] [address] Tel. [phone]    Fax. [phone]  I, Jacqualyn Posey am acting as a Education administrator for Chauncey Cruel, MD.   I, Lurline Del MD, have reviewed the above documentation for accuracy and completeness, and I agree with the above.

## 2019-04-16 ENCOUNTER — Telehealth: Payer: Self-pay | Admitting: Oncology

## 2019-04-16 NOTE — Telephone Encounter (Signed)
I left a message regarding schedule  

## 2019-04-20 ENCOUNTER — Encounter (INDEPENDENT_AMBULATORY_CARE_PROVIDER_SITE_OTHER): Payer: Self-pay | Admitting: Physician Assistant

## 2019-04-20 ENCOUNTER — Other Ambulatory Visit: Payer: Self-pay

## 2019-04-20 ENCOUNTER — Ambulatory Visit (INDEPENDENT_AMBULATORY_CARE_PROVIDER_SITE_OTHER): Payer: 59 | Admitting: Physician Assistant

## 2019-04-20 VITALS — BP 143/82 | HR 90 | Temp 98.0°F | Ht 67.0 in | Wt 246.0 lb

## 2019-04-20 DIAGNOSIS — Z6838 Body mass index (BMI) 38.0-38.9, adult: Secondary | ICD-10-CM

## 2019-04-20 DIAGNOSIS — Z9189 Other specified personal risk factors, not elsewhere classified: Secondary | ICD-10-CM | POA: Diagnosis not present

## 2019-04-20 DIAGNOSIS — R7303 Prediabetes: Secondary | ICD-10-CM | POA: Diagnosis not present

## 2019-04-20 DIAGNOSIS — E559 Vitamin D deficiency, unspecified: Secondary | ICD-10-CM | POA: Diagnosis not present

## 2019-04-20 MED ORDER — VITAMIN D-3 125 MCG (5000 UT) PO TABS: 5000.0000 [IU] | ORAL_TABLET | Freq: Every day | ORAL | Status: DC

## 2019-04-20 MED ORDER — METFORMIN HCL 500 MG PO TABS: 500.0000 mg | ORAL_TABLET | Freq: Two times a day (BID) | ORAL | 0 refills | Status: DC

## 2019-04-20 MED FILL — metFORMIN HCL 500 MG TABS: 500 | 30 days supply | Qty: 60 | Fill #0

## 2019-04-22 NOTE — Progress Notes (Signed)
/ Office: [phone]  /  Fax: [phone]   HPI:   Chief Complaint: OBESITY Candace [name] is here to discuss her progress with her obesity treatment plan. She is on the Category 4 plan and is following her eating plan approximately 65 % of the time. She states she is walking 20 minutes 4 times per week. [name] reports that she is not getting enough protein in on days that she works, and she doesn't feel like eating when she gets home from work. Her weight is 246 lb (111.6 kg) today and has had a weight loss of 1 pound over a period of 2 to 3 weeks since her last visit. She has lost 3 lbs since starting treatment with [name].  Vitamin D deficiency [name] has a diagnosis of vitamin D deficiency. [name] is currently taking OTC vit D and she denies nausea, vomiting or muscle weakness.  Pre-Diabetes [name] has a diagnosis of prediabetes based on her elevated Hgb A1c and was informed this puts her at greater risk of developing diabetes. [name] is on metformin and she denies nausea, vomiting or diarrhea. She continues to work on diet and exercise to decrease risk of diabetes. She denies polyphagia.  At risk for diabetes [name] is at higher than average risk for developing diabetes due to her obesity and prediabetes. She currently denies polyuria or polydipsia.  ASSESSMENT AND PLAN:  Vitamin D deficiency - Plan: Cholecalciferol (VITAMIN D-3) 125 MCG (5000 UT) TABS  Prediabetes - Plan: metFORMIN (GLUCOPHAGE) 500 MG tablet  At risk for diabetes mellitus  Class 2 severe obesity with serious comorbidity and body mass index (BMI) of 38.0 to 38.9 in adult, unspecified obesity type ([place])  PLAN:  Vitamin D Deficiency Candace [name] was informed that low vitamin D levels contributes to fatigue and are associated with obesity, breast, and colon cancer. Early will continue to take OTC vitamin D 5,000 IU daily and she will follow up for routine testing of vitamin D, at least 2-3 times per year. She was informed of  the risk of over-replacement of vitamin D and agrees to not increase her dose unless she discusses this with [name] first.  Pre-Diabetes Candace [name] will continue to work on weight loss, exercise, and decreasing simple carbohydrates in her diet to help decrease the risk of diabetes. We dicussed metformin including benefits and risks. She was informed that eating too many simple carbohydrates or too many calories at one sitting increases the likelihood of GI side effects. Candace [name] agrees to continue metformin 500 mg two times daily with a meal #60 with no refills and follow up with [name] as directed to monitor her progress.  Diabetes risk counseling Candace [name] was given extended (15 minutes) diabetes prevention counseling today. She is 58 y.o. female and has risk factors for diabetes including obesity and prediabetes. We discussed intensive lifestyle modifications today with an emphasis on weight loss as well as increasing exercise and decreasing simple carbohydrates in her diet.  Obesity Candace [name] is currently in the action stage of change. As such, her goal is to continue with weight loss efforts She has agreed to follow the Category 4 plan Candace [name] has been instructed to work up to a goal of 150 minutes of combined cardio and strengthening exercise per week for weight loss and overall health benefits. We discussed the following Behavioral Modification Strategies today: no skipping meals and work on meal planning and easy cooking plans   [name] has agreed to follow up with our clinic in 2 weeks. She was informed of  the importance of frequent follow up visits to maximize her success with intensive lifestyle modifications for her multiple health conditions.  ALLERGIES: No Known Allergies  MEDICATIONS: Current Outpatient Medications on File Prior to Visit  Medication Sig Dispense Refill   anastrozole (ARIMIDEX) 1 MG tablet Take 1 tablet (1 mg total) by mouth daily. 90 tablet 4   buPROPion (WELLBUTRIN XL) 300 MG 24  hr tablet Take 300 mg by mouth daily.      cetirizine (ZYRTEC) 10 MG tablet Take 10 mg by mouth daily. [facility]     esomeprazole (NEXIUM) 40 MG capsule Take 40 mg by mouth every 36 (thirty-six) hours.     FLUoxetine (PROZAC) 10 MG capsule Take 10 mg by mouth daily with lunch.      Multiple Vitamin (MULTIVITAMIN WITH MINERALS) TABS tablet Take 1 tablet by mouth daily.     rOPINIRole (REQUIP) 0.25 MG tablet Take 0.5 mg by mouth daily at 8 pm.     rosuvastatin (CRESTOR) 10 MG tablet Take 10 mg by mouth at bedtime.     No current facility-administered medications on file prior to visit.     PAST MEDICAL HISTORY: Past Medical History:  Diagnosis Date   Abnormal Pap smear    Alcohol abuse    Anovulation    Anxiety    Breast cancer ([place]) 04/05/2016   right breast   Cancer ([place]) 2017   right breast cancer   Complication of anesthesia    slow to wake up   Depression    Endometrial hyperplasia    Endometrial polyp    h/o   Epidermal cyst    h/o   Gallbladder problem    GERD (gastroesophageal reflux disease)    H/O hemorrhoids    H/O menorrhagia    High risk HPV infection    Hyperlipidemia    Insomnia    Obesity    Oligomenorrhea    Prediabetes    Restless legs syndrome    Seasonal allergies    Vitamin D deficiency     PAST SURGICAL HISTORY: Past Surgical History:  Procedure Laterality Date   BREAST LUMPECTOMY Right 05/2016   radiation   BREAST LUMPECTOMY WITH RADIOACTIVE SEED AND SENTINEL LYMPH NODE BIOPSY Right 05/28/2016   Procedure: RADIOACTIVE SEED GUIDED RIGHT BREAST LUMPECTOMY WITH  RIGHT AXILLARY SENTINEL LYMPH NODE BIOPSY;  Surgeon: [name], MD;  Location: [facility];  Service: General;  Laterality: Right;   CARPAL TUNNEL RELEASE  03/26/2011   right hand   CARPAL TUNNEL RELEASE  05/28/2011   Procedure: CARPAL TUNNEL RELEASE;  Surgeon: [name];  Location: [facility];  Service: Orthopedics;   Laterality: Left;   CHOLECYSTECTOMY N/A 01/24/2013   Procedure: LAPAROSCOPIC CHOLECYSTECTOMY WITH INTRAOPERATIVE CHOLANGIOGRAM;  Surgeon: [name], MD;  Location: [facility];  Service: General;  Laterality: N/A;   COLONOSCOPY     HYSTEROSCOPY     [place]   as child   trigger fingers  8 yrs. ago   both done months apart   uterine ablation  06/04/2010   uterine ablation      SOCIAL HISTORY: Social History   Tobacco Use   Smoking status: Never Smoker   Smokeless tobacco: Never Used  Substance Use Topics   Alcohol use: No    Comment: none since 1996   Drug use: No    FAMILY HISTORY: Family History  Problem Relation Age of Onset   Heart disease Mother  Hypertension Mother    Stroke Mother    Dementia Mother        vascular dementia   Hyperlipidemia Mother    Depression Mother    Anxiety disorder Mother    Alcohol abuse Mother    Obesity Mother    Pulmonary fibrosis Father        d. 41   Depression Father    Anxiety disorder Father    Alcohol abuse Father    Heart attack Maternal Grandmother        d. 22-65y   Heart disease Maternal Grandmother    Heart disease Maternal Grandfather        d. 7-63   Leukemia Paternal Grandmother        d. late 72s   Breast cancer Sister 59       IDC s/p mastectomy and chemo    ROS: Review of Systems  Constitutional: Positive for weight loss.  Gastrointestinal: Negative for diarrhea, nausea and vomiting.  Genitourinary: Negative for frequency.  Musculoskeletal:       Negative for muscle weakness  Endo/Heme/Allergies: Negative for polydipsia.       Negative for polyphagia    PHYSICAL EXAM: Blood pressure (!) 143/82, pulse 90, temperature 98 F (36.7 C), temperature source Oral, height 5\' 7"  (1.702 m), weight 246 lb (111.6 kg), SpO2 96 %. Body mass index is 38.53 kg/m. Physical Exam Vitals signs reviewed.  Constitutional:      Appearance: Normal appearance.  She is well-developed. She is obese.  Cardiovascular:     Rate and Rhythm: Normal rate.  Pulmonary:     Effort: Pulmonary effort is normal.  Musculoskeletal: Normal range of motion.  Skin:    General: Skin is warm and dry.  Neurological:     Mental Status: She is alert and oriented to person, place, and time.  Psychiatric:        Mood and Affect: Mood normal.        Behavior: Behavior normal.     RECENT LABS AND TESTS: BMET    Component Value Date/Time   NA 141 04/15/2019 0941   NA 140 02/04/2019 1121   NA 141 04/16/2017 1409   K 4.1 04/15/2019 0941   K 3.8 04/16/2017 1409   CL 105 04/15/2019 0941   CO2 24 04/15/2019 0941   CO2 25 04/16/2017 1409   GLUCOSE 120 (H) 04/15/2019 0941   GLUCOSE 134 04/16/2017 1409   BUN 17 04/15/2019 0941   BUN 14 02/04/2019 1121   BUN 12.4 04/16/2017 1409   CREATININE 0.87 04/15/2019 0941   CREATININE 0.9 04/16/2017 1409   CALCIUM 9.9 04/15/2019 0941   CALCIUM 9.6 04/16/2017 1409   GFRNONAA >60 04/15/2019 0941   GFRAA >60 04/15/2019 0941   Lab Results  Component Value Date   HGBA1C 6.2 (H) 02/04/2019   HGBA1C 5.9 (H) 08/06/2018   HGBA1C 5.9 (H) 02/18/2018   HGBA1C 6.2 (H) 09/17/2017   Lab Results  Component Value Date   INSULIN 23.8 02/04/2019   INSULIN 20.6 08/06/2018   INSULIN 15.2 02/18/2018   INSULIN 24.2 09/17/2017   CBC    Component Value Date/Time   WBC 7.1 04/15/2019 0941   WBC 6.8 11/11/2017 1127   RBC 4.94 04/15/2019 0941   HGB 13.4 04/15/2019 0941   HGB 13.7 04/16/2017 1409   HCT 41.4 04/15/2019 0941   HCT 41.2 04/16/2017 1409   PLT 297 04/15/2019 0941   PLT 259 04/16/2017 1409   MCV 83.8 04/15/2019 0941  MCV 84.4 04/16/2017 1409   MCH 27.1 04/15/2019 0941   MCHC 32.4 04/15/2019 0941   RDW 12.6 04/15/2019 0941   RDW 13.2 04/16/2017 1409   LYMPHSABS 2.1 04/15/2019 0941   LYMPHSABS 1.9 04/16/2017 1409   MONOABS 0.8 04/15/2019 0941   MONOABS 0.6 04/16/2017 1409   EOSABS 0.2 04/15/2019 0941   EOSABS 0.1  04/16/2017 1409   BASOSABS 0.0 04/15/2019 0941   BASOSABS 0.0 04/16/2017 1409   Iron/TIBC/Ferritin/ %Sat No results found for: IRON, TIBC, FERRITIN, IRONPCTSAT Lipid Panel     Component Value Date/Time   CHOL 179 02/04/2019 1121   TRIG 303 (H) 02/04/2019 1121   HDL 48 02/04/2019 1121   LDLCALC 70 02/04/2019 1121   Hepatic Function Panel     Component Value Date/Time   PROT 7.0 04/15/2019 0941   PROT 6.8 02/04/2019 1121   PROT 7.0 04/16/2017 1409   ALBUMIN 4.0 04/15/2019 0941   ALBUMIN 4.9 02/04/2019 1121   ALBUMIN 4.0 04/16/2017 1409   AST 17 04/15/2019 0941   AST 29 04/16/2017 1409   ALT 19 04/15/2019 0941   ALT 34 04/16/2017 1409   ALKPHOS 100 04/15/2019 0941   ALKPHOS 74 04/16/2017 1409   BILITOT 0.3 04/15/2019 0941   BILITOT 0.40 04/16/2017 1409      Component Value Date/Time   TSH 2.390 09/17/2017 1140     Ref. Range 02/04/2019 11:21  Vitamin D, 25-Hydroxy Latest Ref Range: 30.0 - 100.0 ng/mL 50.6    OBESITY BEHAVIORAL INTERVENTION VISIT  Today's visit was # 31   Starting weight: 249 lbs Starting date: 09/17/2017 Today's weight : 246 lbs  Today's date: 04/20/2019 Total lbs lost to date: 3    04/20/2019  Height 5\' 7"  (1.702 m)  Weight 246 lb (111.6 kg)  BMI (Calculated) 38.52  BLOOD PRESSURE - SYSTOLIC [id number]  BLOOD PRESSURE - DIASTOLIC 82   Body Fat % [id number] %  Total Body Water (lbs) 91 lbs    ASK: We discussed the diagnosis of obesity with Leland Johns today and Anisa agreed to give [name] permission to discuss obesity behavioral modification therapy today.  ASSESS: Sakena has the diagnosis of obesity and her BMI today is 38.52 Weslee is in the action stage of change   ADVISE: Alixandra was educated on the multiple health risks of obesity as well as the benefit of weight loss to improve her health. She was advised of the need for long term treatment and the importance of lifestyle modifications to improve her current health and to decrease her risk of  future health problems.  AGREE: Multiple dietary modification options and treatment options were discussed and  Ruhi agreed to follow the recommendations documented in the above note.  ARRANGE: Shena was educated on the importance of frequent visits to treat obesity as outlined per CMS and USPSTF guidelines and agreed to schedule her next follow up appointment today.  Corey Skains, am acting as transcriptionist for [name], PA-C I, [name], PA-C have reviewed above note and agree with its content

## 2019-04-29 ENCOUNTER — Other Ambulatory Visit (INDEPENDENT_AMBULATORY_CARE_PROVIDER_SITE_OTHER): Payer: Self-pay | Admitting: Physician Assistant

## 2019-04-29 DIAGNOSIS — R7303 Prediabetes: Secondary | ICD-10-CM

## 2019-05-13 ENCOUNTER — Encounter (INDEPENDENT_AMBULATORY_CARE_PROVIDER_SITE_OTHER): Payer: Self-pay | Admitting: Physician Assistant

## 2019-05-13 ENCOUNTER — Ambulatory Visit (INDEPENDENT_AMBULATORY_CARE_PROVIDER_SITE_OTHER): Payer: 59 | Admitting: Physician Assistant

## 2019-05-13 ENCOUNTER — Other Ambulatory Visit: Payer: Self-pay

## 2019-05-13 VITALS — BP 149/84 | HR 105 | Temp 98.4°F | Ht 67.0 in | Wt 246.0 lb

## 2019-05-13 DIAGNOSIS — E7849 Other hyperlipidemia: Secondary | ICD-10-CM | POA: Diagnosis not present

## 2019-05-13 DIAGNOSIS — Z9189 Other specified personal risk factors, not elsewhere classified: Secondary | ICD-10-CM

## 2019-05-13 DIAGNOSIS — Z6838 Body mass index (BMI) 38.0-38.9, adult: Secondary | ICD-10-CM

## 2019-05-13 DIAGNOSIS — R7303 Prediabetes: Secondary | ICD-10-CM | POA: Diagnosis not present

## 2019-05-13 MED ORDER — METFORMIN HCL 500 MG PO TABS: 500.0000 mg | ORAL_TABLET | Freq: Two times a day (BID) | ORAL | 0 refills | Status: DC

## 2019-05-14 MED FILL — metFORMIN HCL 500 MG TABS: 500 | 30 days supply | Qty: 60 | Fill #0

## 2019-05-17 NOTE — Progress Notes (Signed)
Office: [phone]  /  Fax: [phone]   HPI:   Chief Complaint: OBESITY Candace [name] is here to discuss her progress with her obesity treatment plan. She is on the Category 4 plan and is following her eating plan approximately 60 % of the time. She states she is walking 20 to 30 minutes 4 times per week. Candace [name] reports that she has been struggling with meal planning. She would like to start journaling all day. Her weight is 246 lb (111.6 kg) today and she has maintained weight since her last visit. She has lost 3 lbs since starting treatment with [name].  Pre-Diabetes Candace [name] has a diagnosis of prediabetes based on her elevated Hgb A1c and was informed this puts her at greater risk of developing diabetes. She denies nausea, vomiting or diarrhea on metformin. Candace [name] continues to work on diet and exercise to decrease risk of diabetes. She denies polyphagia.  At risk for diabetes Candace [name] is at higher than average risk for developing diabetes due to her obesity and prediabetes. She currently denies polyuria or polydipsia.  Hyperlipidemia Candace [name] has hyperlipidemia and she is on Crestor. She has been trying to improve her cholesterol levels with intensive lifestyle modification including a low saturated fat diet, exercise and weight loss. She denies any chest pain.  ASSESSMENT AND PLAN:  Prediabetes - Plan: metFORMIN (GLUCOPHAGE) 500 MG tablet  Other hyperlipidemia  At risk for diabetes mellitus  Class 2 severe obesity with serious comorbidity and body mass index (BMI) of 38.0 to 38.9 in adult, unspecified obesity type [facility])  PLAN:  Pre-Diabetes Candace [name] will continue to work on weight loss, exercise, and decreasing simple carbohydrates in her diet to help decrease the risk of diabetes. We dicussed metformin including benefits and risks. She was informed that eating too many simple carbohydrates or too many calories at one sitting increases the likelihood of GI side effects. Candace [name] agrees to  continue metformin 500 mg two times daily with meals #60 with no refills and follow up with [name] as directed to monitor her progress.  Diabetes risk counseling Candace [name] was given extended (15 minutes) diabetes prevention counseling today. She is 58 y.o. female and has risk factors for diabetes including obesity and prediabetes. We discussed intensive lifestyle modifications today with an emphasis on weight loss as well as increasing exercise and decreasing simple carbohydrates in her diet.  Hyperlipidemia Candace [name] was informed of the American Heart Association Guidelines emphasizing intensive lifestyle modifications as the first line treatment for hyperlipidemia. We discussed many lifestyle modifications today in depth, and Candace [name] will continue to work on decreasing saturated fats such as fatty red meat, butter and many fried foods. She will also increase vegetables and lean protein in her diet and continue to work on exercise and weight loss efforts. Candace [name] will continue Crestor and follow up as directed.  Obesity Candace [name] is currently in the action stage of change. As such, her goal is to continue with weight loss efforts She has agreed to keep a food journal with 1700 to 1800 calories and 115 grams of protein daily Candace [name] has been instructed to work up to a goal of 150 minutes of combined cardio and strengthening exercise per week for weight loss and overall health benefits. We discussed the following Behavioral Modification Strategies today: work on meal planning and easy cooking plans, holiday eating strategies  and ways to avoid night time snacking  Candace [name] has agreed to follow up with our clinic in 3 weeks. She was informed of the importance of frequent  follow up visits to maximize her success with intensive lifestyle modifications for her multiple health conditions.  ALLERGIES: No Known Allergies  MEDICATIONS: Current Outpatient Medications on File Prior to Visit  Medication Sig Dispense Refill     anastrozole (ARIMIDEX) 1 MG tablet Take 1 tablet (1 mg total) by mouth daily. 90 tablet 4   buPROPion (WELLBUTRIN XL) 300 MG 24 hr tablet Take 300 mg by mouth daily.      cetirizine (ZYRTEC) 10 MG tablet Take 10 mg by mouth daily. [facility]     Cholecalciferol (VITAMIN D-3) 125 MCG (5000 UT) TABS Take 5,000 Units by mouth daily. 30 tablet    esomeprazole (NEXIUM) 40 MG capsule Take 40 mg by mouth every 36 (thirty-six) hours.     FLUoxetine (PROZAC) 10 MG capsule Take 10 mg by mouth daily with lunch.      Multiple Vitamin (MULTIVITAMIN WITH MINERALS) TABS tablet Take 1 tablet by mouth daily.     rOPINIRole (REQUIP) 0.25 MG tablet Take 0.5 mg by mouth daily at 8 pm.     rosuvastatin (CRESTOR) 10 MG tablet Take 10 mg by mouth at bedtime.     No current facility-administered medications on file prior to visit.     PAST MEDICAL HISTORY: Past Medical History:  Diagnosis Date   Abnormal Pap smear    Alcohol abuse    Anovulation    Anxiety    Breast cancer ([place]) 04/05/2016   right breast   Cancer ([place]) 2017   right breast cancer   Complication of anesthesia    slow to wake up   Depression    Endometrial hyperplasia    Endometrial polyp    h/o   Epidermal cyst    h/o   Gallbladder problem    GERD (gastroesophageal reflux disease)    H/O hemorrhoids    H/O menorrhagia    High risk HPV infection    Hyperlipidemia    Insomnia    Obesity    Oligomenorrhea    Prediabetes    Restless legs syndrome    Seasonal allergies    Vitamin D deficiency     PAST SURGICAL HISTORY: Past Surgical History:  Procedure Laterality Date   BREAST LUMPECTOMY Right 05/2016   radiation   BREAST LUMPECTOMY WITH RADIOACTIVE SEED AND SENTINEL LYMPH NODE BIOPSY Right 05/28/2016   Procedure: RADIOACTIVE SEED GUIDED RIGHT BREAST LUMPECTOMY WITH  RIGHT AXILLARY SENTINEL LYMPH NODE BIOPSY;  Surgeon: [name], MD;  Location: [place];  Service:  General;  Laterality: Right;   CARPAL TUNNEL RELEASE  03/26/2011   right hand   CARPAL TUNNEL RELEASE  05/28/2011   Procedure: CARPAL TUNNEL RELEASE;  Surgeon: [name];  Location: [facility];  Service: Orthopedics;  Laterality: Left;   CHOLECYSTECTOMY N/A 01/24/2013   Procedure: LAPAROSCOPIC CHOLECYSTECTOMY WITH INTRAOPERATIVE CHOLANGIOGRAM;  Surgeon: [name], MD;  Location: [place];  Service: General;  Laterality: N/A;   COLONOSCOPY     HYSTEROSCOPY     [place]   as child   trigger fingers  8 yrs. ago   both done months apart   uterine ablation  06/04/2010   uterine ablation      SOCIAL HISTORY: Social History   Tobacco Use   Smoking status: Never Smoker   Smokeless tobacco: Never Used  Substance Use Topics   Alcohol use: No    Comment: none since 1996   Drug use: No  FAMILY HISTORY: Family History  Problem Relation Age of Onset   Heart disease Mother    Hypertension Mother    Stroke Mother    Dementia Mother        vascular dementia   Hyperlipidemia Mother    Depression Mother    Anxiety disorder Mother    Alcohol abuse Mother    Obesity Mother    Pulmonary fibrosis Father        d. 59   Depression Father    Anxiety disorder Father    Alcohol abuse Father    Heart attack Maternal Grandmother        d. 68-65y   Heart disease Maternal Grandmother    Heart disease Maternal Grandfather        d. 69-63   Leukemia Paternal Grandmother        d. late 28s   Breast cancer Sister 58       IDC s/p mastectomy and chemo    ROS: Review of Systems  Constitutional: Negative for weight loss.  Cardiovascular: Negative for chest pain.  Gastrointestinal: Negative for diarrhea, nausea and vomiting.  Genitourinary: Negative for frequency.  Endo/Heme/Allergies: Negative for polydipsia.       Negative for polyphagia    PHYSICAL EXAM: Blood pressure (!) 149/84, pulse (!) 105, temperature 98.4  F (36.9 C), temperature source Oral, height 5\' 7"  (1.702 m), weight 246 lb (111.6 kg), SpO2 95 %. Body mass index is 38.53 kg/m. Physical Exam Vitals signs reviewed.  Constitutional:      Appearance: Normal appearance. She is well-developed. She is obese.  Cardiovascular:     Rate and Rhythm: Normal rate.  Pulmonary:     Effort: Pulmonary effort is normal.  Musculoskeletal: Normal range of motion.  Skin:    General: Skin is warm and dry.  Neurological:     Mental Status: She is alert and oriented to person, place, and time.  Psychiatric:        Mood and Affect: Mood normal.        Behavior: Behavior normal.     RECENT LABS AND TESTS: BMET    Component Value Date/Time   NA 141 04/15/2019 0941   NA 140 02/04/2019 1121   NA 141 04/16/2017 1409   K 4.1 04/15/2019 0941   K 3.8 04/16/2017 1409   CL 105 04/15/2019 0941   CO2 24 04/15/2019 0941   CO2 25 04/16/2017 1409   GLUCOSE 120 (H) 04/15/2019 0941   GLUCOSE 134 04/16/2017 1409   BUN 17 04/15/2019 0941   BUN 14 02/04/2019 1121   BUN 12.4 04/16/2017 1409   CREATININE 0.87 04/15/2019 0941   CREATININE 0.9 04/16/2017 1409   CALCIUM 9.9 04/15/2019 0941   CALCIUM 9.6 04/16/2017 1409   GFRNONAA >60 04/15/2019 0941   GFRAA >60 04/15/2019 0941   Lab Results  Component Value Date   HGBA1C 6.2 (H) 02/04/2019   HGBA1C 5.9 (H) 08/06/2018   HGBA1C 5.9 (H) 02/18/2018   HGBA1C 6.2 (H) 09/17/2017   Lab Results  Component Value Date   INSULIN 23.8 02/04/2019   INSULIN 20.6 08/06/2018   INSULIN 15.2 02/18/2018   INSULIN 24.2 09/17/2017   CBC    Component Value Date/Time   WBC 7.1 04/15/2019 0941   WBC 6.8 11/11/2017 1127   RBC 4.94 04/15/2019 0941   HGB 13.4 04/15/2019 0941   HGB 13.7 04/16/2017 1409   HCT 41.4 04/15/2019 0941   HCT 41.2 04/16/2017 1409   PLT 297 04/15/2019  0941   PLT 259 04/16/2017 1409   MCV 83.8 04/15/2019 0941   MCV 84.4 04/16/2017 1409   MCH 27.1 04/15/2019 0941   MCHC 32.4 04/15/2019 0941     RDW 12.6 04/15/2019 0941   RDW 13.2 04/16/2017 1409   LYMPHSABS 2.1 04/15/2019 0941   LYMPHSABS 1.9 04/16/2017 1409   MONOABS 0.8 04/15/2019 0941   MONOABS 0.6 04/16/2017 1409   EOSABS 0.2 04/15/2019 0941   EOSABS 0.1 04/16/2017 1409   BASOSABS 0.0 04/15/2019 0941   BASOSABS 0.0 04/16/2017 1409   Iron/TIBC/Ferritin/ %Sat No results found for: IRON, TIBC, FERRITIN, IRONPCTSAT Lipid Panel     Component Value Date/Time   CHOL 179 02/04/2019 1121   TRIG 303 (H) 02/04/2019 1121   HDL 48 02/04/2019 1121   LDLCALC 70 02/04/2019 1121   Hepatic Function Panel     Component Value Date/Time   PROT 7.0 04/15/2019 0941   PROT 6.8 02/04/2019 1121   PROT 7.0 04/16/2017 1409   ALBUMIN 4.0 04/15/2019 0941   ALBUMIN 4.9 02/04/2019 1121   ALBUMIN 4.0 04/16/2017 1409   AST 17 04/15/2019 0941   AST 29 04/16/2017 1409   ALT 19 04/15/2019 0941   ALT 34 04/16/2017 1409   ALKPHOS 100 04/15/2019 0941   ALKPHOS 74 04/16/2017 1409   BILITOT 0.3 04/15/2019 0941   BILITOT 0.40 04/16/2017 1409      Component Value Date/Time   TSH 2.390 09/17/2017 1140     Ref. Range 02/04/2019 11:21  Vitamin D, 25-Hydroxy Latest Ref Range: 30.0 - 100.0 ng/mL 50.6    OBESITY BEHAVIORAL INTERVENTION VISIT  Today's visit was # 32  Starting weight: 249 lbs Starting date: 09/17/2017 Today's weight : 246 lbs Today's date: 05/13/2019 Total lbs lost to date: 3    05/13/2019  Height 5\' 7"  (1.702 m)  Weight 246 lb (111.6 kg)  BMI (Calculated) 38.52  BLOOD PRESSURE - SYSTOLIC [id number]  BLOOD PRESSURE - DIASTOLIC 84   Body Fat % [id number] %  Total Body Water (lbs) 91.6 lbs    ASK: We discussed the diagnosis of obesity with Candace [name] today and Candace [name] agreed to give [name] permission to discuss obesity behavioral modification therapy today.  ASSESS: Candace [name] has the diagnosis of obesity and her BMI today is 38.52 Candace [name] is in the action stage of change   ADVISE: Kavia was educated on the multiple health risks of  obesity as well as the benefit of weight loss to improve her health. She was advised of the need for long term treatment and the importance of lifestyle modifications to improve her current health and to decrease her risk of future health problems.  AGREE: Multiple dietary modification options and treatment options were discussed and  Candace [name] agreed to follow the recommendations documented in the above note.  ARRANGE: Candace [name] was educated on the importance of frequent visits to treat obesity as outlined per CMS and USPSTF guidelines and agreed to schedule her next follow up appointment today.  Corey Skains, am acting as transcriptionist for [name], PA-C I, [name], PA-C have reviewed above note and agree with its content

## 2019-06-03 MED FILL — metFORMIN HCL 500 MG TABS: 500 | 30 days supply | Qty: 60 | Fill #0

## 2019-06-08 ENCOUNTER — Ambulatory Visit (INDEPENDENT_AMBULATORY_CARE_PROVIDER_SITE_OTHER): Payer: 59 | Admitting: Physician Assistant

## 2019-06-17 ENCOUNTER — Ambulatory Visit (INDEPENDENT_AMBULATORY_CARE_PROVIDER_SITE_OTHER): Payer: 59 | Admitting: Family Medicine

## 2019-06-17 ENCOUNTER — Encounter (INDEPENDENT_AMBULATORY_CARE_PROVIDER_SITE_OTHER): Payer: Self-pay | Admitting: Family Medicine

## 2019-06-17 ENCOUNTER — Other Ambulatory Visit: Payer: Self-pay

## 2019-06-17 VITALS — BP 130/82 | HR 87 | Temp 98.3°F | Ht 67.0 in | Wt 249.0 lb

## 2019-06-17 DIAGNOSIS — Z9189 Other specified personal risk factors, not elsewhere classified: Secondary | ICD-10-CM | POA: Diagnosis not present

## 2019-06-17 DIAGNOSIS — E559 Vitamin D deficiency, unspecified: Secondary | ICD-10-CM

## 2019-06-17 DIAGNOSIS — E7849 Other hyperlipidemia: Secondary | ICD-10-CM | POA: Diagnosis not present

## 2019-06-17 DIAGNOSIS — F3289 Other specified depressive episodes: Secondary | ICD-10-CM

## 2019-06-17 DIAGNOSIS — R7303 Prediabetes: Secondary | ICD-10-CM | POA: Diagnosis not present

## 2019-06-17 MED ORDER — METFORMIN HCL 500 MG PO TABS: 500.0000 mg | ORAL_TABLET | Freq: Two times a day (BID) | ORAL | 0 refills | Status: DC

## 2019-06-17 MED FILL — BUPROPION HCL XL 300 MG TAB: 300 | 90 days supply | Qty: 90 | Fill #1

## 2019-06-18 MED FILL — FLUoxetine HCL 10 MG TABS: 10 | 90 days supply | Qty: 90 | Fill #0

## 2019-06-18 MED FILL — ROSUVASTATIN CALCIUM 10 MG: 10 | 90 days supply | Qty: 90 | Fill #0

## 2019-06-22 NOTE — Progress Notes (Signed)
Office: [phone]  /  Fax: [phone]   HPI:  Chief Complaint: OBESITY Candace [name] is here to discuss her progress with her obesity treatment plan. She is on the keep a food journal with 1700-1800 calories and 115 grams of protein daily and states she is following her eating plan approximately 30 % of the time. She states she is walking for 40 minutes 3 times per week.  Candace [name] notes increased work stress. It is difficult for her to focus on her health. She has 2 teenage daughters. She has a therapist and a sponsor (history of alcoholism and has been sober for years). She craves sugar and carbohydrates. She denies medication side effects.  Today's visit was # 63  Starting weight: 249 lbs Starting date: 09/17/17 Today's weight : 249 lbs Today's date: 06/17/2019 Total lbs lost to date: 0 Total lbs lost since last in-office visit: Candace [name] has a diagnosis of pre-diabetes. She is currently taking metformin.  Lab Results  Component Value Date   HGBA1C 6.2 (H) 02/04/2019   At risk for diabetes Candace [name] is at higher than average risk for developing diabetes due to her obesity and pre-diabetes.   Depression with Emotional Eating Behaviors Candace [name] is taking Prozac and Wellbutrin. She is working with a Contractor.  Hyperlipidemia Candace [name] has a diagnosis of hyperlipidemia. She is currently taking Crestor.   Lab Results  Component Value Date   CHOL 179 02/04/2019   HDL 48 02/04/2019   LDLCALC 70 02/04/2019   TRIG 303 (H) 02/04/2019   Lab Results  Component Value Date   ALT 19 04/15/2019   AST 17 04/15/2019   ALKPHOS 100 04/15/2019   BILITOT 0.3 04/15/2019   The 10-year ASCVD risk score [name], et al., 2013) is: 2.8%   Values used to calculate the score:     Age: 58 years     Sex: Female     Is Non-Hispanic African American: No     Diabetic: No     Tobacco smoker: No     Systolic Blood Pressure: [id number] mmHg     Is BP treated: No     HDL  Cholesterol: 48 mg/dL     Total Cholesterol: 179 mg/dL  Vitamin D Deficiency Candace [name] has a diagnosis of vitamin D deficiency. She is taking Vit D 5,000 IU daily.  ASSESSMENT AND PLAN:  Prediabetes - Plan: metFORMIN (GLUCOPHAGE) 500 MG tablet  Other hyperlipidemia  Other depression, with emotional eating  Vitamin D deficiency  At risk for diabetes mellitus  PLAN:  Pre-Diabetes Candace [name] will continue to work on weight loss, exercise, and decreasing simple carbohydrates to help decrease the risk of diabetes. Candace [name] agrees to continue taking metformin 500 mg PO BID #60 and we will refill for 1 month. We will continue to monitor.  Diabetes risk counseling (~15 min) Candace [name] is a 58 y.o. female and has risk factors for diabetes including obesity. We discussed intensive lifestyle modifications today with an emphasis on weight loss as well as increasing exercise and decreasing simple carbohydrates in her diet.  Emotional Eating Behaviors (other depression) Behavior modification techniques were discussed today to help Candace [name] deal with her emotional/non-hunger eating behaviors. Candace [name] will continue her current treatment. We will continue to follow and monitor her progress.  Hyperlipidemia Intensive lifestyle modifications as the first line treatment for hyperlipidemia. We discussed many lifestyle modifications today and Candace [name] will continue to work on diet, exercise and weight loss efforts. We will continue to monitor.  Vitamin D Deficiency Low vitamin D level contributes to fatigue and are associated with obesity, breast, and colon cancer. Candace [name] agrees to continue taking Vit D 5,000 IU daily and will follow up for routine testing of vitamin D, at least 2-3 times per year to avoid over-replacement. We will continue to monitor.   Obesity Candace [name] is currently in the action stage of change. As such, her goal is to continue with weight loss efforts. She has agreed to follow the Category 4  plan. Candace [name] has been instructed to work up to a goal of 150 minutes of combined cardio and strengthening exercise per week for weight loss and overall health benefits. We discussed the following Behavioral Modification Strategies today: increase H20 intake, keeping healthy foods in the home, emotional eating strategies, and planning for success.   Candace [name] has agreed to follow up with our clinic in 3 weeks. She was informed of the importance of frequent follow up visits to maximize her success with intensive lifestyle modifications for her multiple health conditions.  ALLERGIES: No Known Allergies  MEDICATIONS: Current Outpatient Medications on File Prior to Visit  Medication Sig Dispense Refill  . anastrozole (ARIMIDEX) 1 MG tablet Take 1 tablet (1 mg total) by mouth daily. 90 tablet 4  . buPROPion (WELLBUTRIN XL) 300 MG 24 hr tablet Take 300 mg by mouth daily.     . cetirizine (ZYRTEC) 10 MG tablet Take 10 mg by mouth daily. [facility]    . Cholecalciferol (VITAMIN D-3) 125 MCG (5000 UT) TABS Take 5,000 Units by mouth daily. 30 tablet   . esomeprazole (NEXIUM) 40 MG capsule Take 40 mg by mouth every 36 (thirty-six) hours.    [name] FLUoxetine (PROZAC) 10 MG capsule Take 10 mg by mouth daily with lunch.     . Multiple Vitamin (MULTIVITAMIN WITH MINERALS) TABS tablet Take 1 tablet by mouth daily.    [name] rOPINIRole (REQUIP) 0.25 MG tablet Take 0.5 mg by mouth daily at 8 pm.    . rosuvastatin (CRESTOR) 10 MG tablet Take 10 mg by mouth at bedtime.     No current facility-administered medications on file prior to visit.    PAST MEDICAL HISTORY: Past Medical History:  Diagnosis Date  . Abnormal Pap smear   . Alcohol abuse   . Anovulation   . Anxiety   . Breast cancer ([place]) 04/05/2016   right breast  . Cancer ([place]) 2017   right breast cancer  . Complication of anesthesia    slow to wake up  . Depression   . Endometrial hyperplasia   . Endometrial polyp    h/o  .  Epidermal cyst    h/o  . Gallbladder problem   . GERD (gastroesophageal reflux disease)   . H/O hemorrhoids   . H/O menorrhagia   . High risk HPV infection   . Hyperlipidemia   . Insomnia   . Obesity   . Oligomenorrhea   . Prediabetes   . Restless legs syndrome   . Seasonal allergies   . Vitamin D deficiency     PAST SURGICAL HISTORY: Past Surgical History:  Procedure Laterality Date  . BREAST LUMPECTOMY Right 05/2016   radiation  . BREAST LUMPECTOMY WITH RADIOACTIVE SEED AND SENTINEL LYMPH NODE BIOPSY Right 05/28/2016   Procedure: RADIOACTIVE SEED GUIDED RIGHT BREAST LUMPECTOMY WITH  RIGHT AXILLARY SENTINEL LYMPH NODE BIOPSY;  Surgeon: [name], MD;  Location: [place];  Service: General;  Laterality: Right;  . CARPAL TUNNEL RELEASE  03/26/2011  right hand  . CARPAL TUNNEL RELEASE  05/28/2011   Procedure: CARPAL TUNNEL RELEASE;  Surgeon: [name];  [facility];  Service: Orthopedics;  Laterality: Left;  . CHOLECYSTECTOMY N/A 01/24/2013   Procedure: LAPAROSCOPIC CHOLECYSTECTOMY WITH INTRAOPERATIVE CHOLANGIOGRAM;  Surgeon: [name], MD;  Location: [place];  Service: General;  Laterality: N/A;  . COLONOSCOPY    . HYSTEROSCOPY    . POLYPECTOMY    . TONSILLECTOMY  1967   as child  . trigger fingers  8 yrs. ago   both done months apart  . uterine ablation  06/04/2010  . uterine ablation      SOCIAL HISTORY: Social History   Tobacco Use  . Smoking status: Never Smoker  . Smokeless tobacco: Never Used  Substance Use Topics  . Alcohol use: No    Comment: none since 1996  . Drug use: No    FAMILY HISTORY: Family History  Problem Relation Age of Onset  . Heart disease Mother   . Hypertension Mother   . Stroke Mother   . Dementia Mother        vascular dementia  . Hyperlipidemia Mother   . Depression Mother   . Anxiety disorder Mother   . Alcohol abuse Mother   . Obesity Mother   . Pulmonary fibrosis Father        d. 20  .  Depression Father   . Anxiety disorder Father   . Alcohol abuse Father   . Heart attack Maternal Grandmother        d. [phone]  . Heart disease Maternal Grandmother   . Heart disease Maternal Grandfather        d. 60-63  . Leukemia Paternal Grandmother        d. late 41s  . Breast cancer Sister 69       IDC s/p mastectomy and chemo   ROS: Review of Systems  Constitutional: Negative for weight loss.  Psychiatric/Behavioral: Positive for depression (Emotional eating).   PHYSICAL EXAM: Blood pressure 130/82, pulse 87, temperature 98.3 F (36.8 C), height 5\' 7"  (1.702 m), weight 249 lb (112.9 kg), SpO2 98 %. Body mass index is 39 kg/m.   General: Cooperative, alert, well developed, in no acute distress. HEENT: Conjunctivae and lids unremarkable. Neck: No thyromegaly.  Cardiovascular: Regular rhythm.  Lungs: Normal work of breathing. Extremities: No edema.  Neurologic: No focal deficits.   RECENT LABS AND TESTS: BMET    Component Value Date/Time   NA 141 04/15/2019 0941   NA 140 02/04/2019 1121   NA 141 04/16/2017 1409   K 4.1 04/15/2019 0941   K 3.8 04/16/2017 1409   CL 105 04/15/2019 0941   CO2 24 04/15/2019 0941   CO2 25 04/16/2017 1409   GLUCOSE 120 (H) 04/15/2019 0941   GLUCOSE 134 04/16/2017 1409   BUN 17 04/15/2019 0941   BUN 14 02/04/2019 1121   BUN 12.4 04/16/2017 1409   CREATININE 0.87 04/15/2019 0941   CREATININE 0.9 04/16/2017 1409   CALCIUM 9.9 04/15/2019 0941   CALCIUM 9.6 04/16/2017 1409   GFRNONAA >60 04/15/2019 0941   GFRAA >60 04/15/2019 0941   Lab Results  Component Value Date   HGBA1C 6.2 (H) 02/04/2019   HGBA1C 5.9 (H) 08/06/2018   HGBA1C 5.9 (H) 02/18/2018   HGBA1C 6.2 (H) 09/17/2017   Lab Results  Component Value Date   INSULIN 23.8 02/04/2019   INSULIN 20.6 08/06/2018   INSULIN 15.2 02/18/2018   INSULIN 24.2 09/17/2017  CBC    Component Value Date/Time   WBC 7.1 04/15/2019 0941   WBC 6.8 11/11/2017 1127   RBC 4.94  04/15/2019 0941   HGB 13.4 04/15/2019 0941   HGB 13.7 04/16/2017 1409   HCT 41.4 04/15/2019 0941   HCT 41.2 04/16/2017 1409   PLT 297 04/15/2019 0941   PLT 259 04/16/2017 1409   MCV 83.8 04/15/2019 0941   MCV 84.4 04/16/2017 1409   MCH 27.1 04/15/2019 0941   MCHC 32.4 04/15/2019 0941   RDW 12.6 04/15/2019 0941   RDW 13.2 04/16/2017 1409   LYMPHSABS 2.1 04/15/2019 0941   LYMPHSABS 1.9 04/16/2017 1409   MONOABS 0.8 04/15/2019 0941   MONOABS 0.6 04/16/2017 1409   EOSABS 0.2 04/15/2019 0941   EOSABS 0.1 04/16/2017 1409   BASOSABS 0.0 04/15/2019 0941   BASOSABS 0.0 04/16/2017 1409   Iron/TIBC/Ferritin/ %Sat No results found for: IRON, TIBC, FERRITIN, IRONPCTSAT Lipid Panel     Component Value Date/Time   CHOL 179 02/04/2019 1121   TRIG 303 (H) 02/04/2019 1121   HDL 48 02/04/2019 1121   LDLCALC 70 02/04/2019 1121   Hepatic Function Panel     Component Value Date/Time   PROT 7.0 04/15/2019 0941   PROT 6.8 02/04/2019 1121   PROT 7.0 04/16/2017 1409   ALBUMIN 4.0 04/15/2019 0941   ALBUMIN 4.9 02/04/2019 1121   ALBUMIN 4.0 04/16/2017 1409   AST 17 04/15/2019 0941   AST 29 04/16/2017 1409   ALT 19 04/15/2019 0941   ALT 34 04/16/2017 1409   ALKPHOS 100 04/15/2019 0941   ALKPHOS 74 04/16/2017 1409   BILITOT 0.3 04/15/2019 0941   BILITOT 0.40 04/16/2017 1409      Component Value Date/Time   TSH 2.390 09/17/2017 1140   I, Trixie Dredge, am acting as Location manager for Briscoe Deutscher, DO  I have reviewed the above documentation for accuracy and completeness, and I agree with the above. Briscoe Deutscher, DO

## 2019-06-23 ENCOUNTER — Encounter (INDEPENDENT_AMBULATORY_CARE_PROVIDER_SITE_OTHER): Payer: Self-pay | Admitting: Family Medicine

## 2019-06-30 MED FILL — ESOMEPRAZOLE MAG DR 40 MG C: 40 | 90 days supply | Qty: 90 | Fill #2

## 2019-06-30 MED FILL — rOPINIRole HCL 0.5 MG TABS: 0.5 | 90 days supply | Qty: 90 | Fill #2

## 2019-06-30 MED FILL — metFORMIN HCL 500 MG TABS: 500 | 30 days supply | Qty: 60 | Fill #0

## 2019-07-15 ENCOUNTER — Encounter (INDEPENDENT_AMBULATORY_CARE_PROVIDER_SITE_OTHER): Payer: Self-pay | Admitting: Physician Assistant

## 2019-07-15 ENCOUNTER — Ambulatory Visit (INDEPENDENT_AMBULATORY_CARE_PROVIDER_SITE_OTHER): Payer: 59 | Admitting: Physician Assistant

## 2019-07-15 ENCOUNTER — Other Ambulatory Visit: Payer: Self-pay

## 2019-07-15 VITALS — BP 167/83 | HR 100 | Temp 98.1°F | Ht 67.0 in | Wt 245.0 lb

## 2019-07-15 DIAGNOSIS — Z6838 Body mass index (BMI) 38.0-38.9, adult: Secondary | ICD-10-CM

## 2019-07-15 DIAGNOSIS — Z9189 Other specified personal risk factors, not elsewhere classified: Secondary | ICD-10-CM

## 2019-07-15 DIAGNOSIS — R7303 Prediabetes: Secondary | ICD-10-CM | POA: Diagnosis not present

## 2019-07-15 DIAGNOSIS — E7849 Other hyperlipidemia: Secondary | ICD-10-CM | POA: Diagnosis not present

## 2019-07-15 MED ORDER — METFORMIN HCL 500 MG PO TABS: 500.0000 mg | ORAL_TABLET | Freq: Two times a day (BID) | ORAL | 0 refills | Status: DC

## 2019-07-19 NOTE — Progress Notes (Signed)
Chief Complaint:   OBESITY Candace [name] is here to discuss her progress with her obesity treatment plan along with follow-up of her obesity related diagnoses. Candace [name] is on the Category 4 Plan and states she is following her eating plan approximately 50% of the time. Candace [name] states she is walking 30 minutes 4 times per week.  Today's visit was #: 23 Starting weight: 249 lbs Starting date: 09/17/2017 Today's weight: 245 lbs Today's date: 07/15/2019 Total lbs lost to date: 4 Total lbs lost since last in-office visit: 4  Interim History: Candace [name] traveled to [place] recently and she reports that she made good choices. She is doing a better job meal planning and she is walking more often. Candace [name] did not have an issue with her appetite over the last few weeks.  Subjective:   Prediabetes  Candace [name] has a diagnosis of prediabetes based on her elevated Hgb A1c and was informed this puts her at greater risk of developing diabetes. Candace [name] is on metformin twice daily. She has no nausea, vomiting or diarrhea. Her last A1c was 6.2 (02/04/19). She has no polyphagia. Candace [name] is due for labs. She continues to work on diet and exercise to decrease her risk of diabetes.   Lab Results  Component Value Date   HGBA1C 6.2 (H) 02/04/2019   Lab Results  Component Value Date   INSULIN 23.8 02/04/2019   INSULIN 20.6 08/06/2018   INSULIN 15.2 02/18/2018   INSULIN 24.2 09/17/2017   Other hyperlipidemia Candace [name] has hyperlipidemia and she is on Crestor 10 mg daily. Her last triglycerides were 303, HDL was 48 and LDL was 70. She has been trying to improve her cholesterol levels with intensive lifestyle modification including a low saturated fat diet, exercise and weight loss. She denies any chest pain or myalgias. Candace [name] is due for labs at the next visit.  Lab Results  Component Value Date   ALT 19 04/15/2019   AST 17 04/15/2019   ALKPHOS 100 04/15/2019   BILITOT 0.3 04/15/2019   Lab Results  Component Value  Date   CHOL 179 02/04/2019   HDL 48 02/04/2019   LDLCALC 70 02/04/2019   TRIG 303 (H) 02/04/2019   At risk for diabetes mellitus Candace [name] is at higher than average risk for developing diabetes due to her obesity and prediabetes.   Assessment/Plan:   Prediabetes Candace [name] will continue to work on weight loss, exercise, and decreasing simple carbohydrates to help decrease the risk of diabetes. Candace [name] agrees to continue metformin 500 mg two times daily #60 with no refills.  Other hyperlipidemia Cardiovascular risk and specific lipid/LDL goals reviewed.  We discussed several lifestyle modifications today and Candace [name] will continue with weight loss and medications. Orders and follow up as documented in patient record.   Counseling Intensive lifestyle modifications are the first line treatment for this issue. . Dietary changes: Increase soluble fiber. Decrease simple carbohydrates. . Exercise changes: Moderate to vigorous-intensity aerobic activity 150 minutes per week if tolerated. . Lipid-lowering medications: see documented in medical record.  At risk for diabetes mellitus Candace [name] was given approximately 15 minutes of diabetes education and counseling today. We discussed intensive lifestyle modifications today with an emphasis on weight loss as well as increasing exercise and decreasing simple carbohydrates in her diet. We also reviewed medication options with an emphasis on risk versus benefit of those discussed.   Obesity Candace [name] is currently in the action stage of change. As such, her goal is to continue with weight loss efforts. She  has agreed to the Category 4 Plan.   Exercise goals: For substantial health benefits, adults should do at least 150 minutes (2 hours and 30 minutes) a week of moderate-intensity, or 75 minutes (1 hour and 15 minutes) a week of vigorous-intensity aerobic physical activity, or an equivalent combination of moderate- and vigorous-intensity aerobic activity. Aerobic  activity should be performed in episodes of at least 10 minutes, and preferably, it should be spread throughout the week. Adults should also include muscle-strengthening activities that involve all major muscle groups on 2 or more days a week.  Behavioral modification strategies: meal planning and cooking strategies and planning for success.  Candace [name] has agreed to follow-up with our clinic in 2 weeks. She was informed of the importance of frequent follow-up visits to maximize her success with intensive lifestyle modifications for her multiple health conditions.   Objective:   Blood pressure (!) 167/83, pulse 100, temperature 98.1 F (36.7 C), temperature source Oral, height 5\' 7"  (1.702 m), weight 245 lb (111.1 kg), SpO2 100 %. Body mass index is 38.37 kg/m.  General: Cooperative, alert, well developed, in no acute distress. HEENT: Conjunctivae and lids unremarkable. Cardiovascular: Regular rhythm.  Lungs: Normal work of breathing. Neurologic: No focal deficits.   Lab Results  Component Value Date   CREATININE 0.87 04/15/2019   BUN 17 04/15/2019   NA 141 04/15/2019   K 4.1 04/15/2019   CL 105 04/15/2019   CO2 24 04/15/2019   Lab Results  Component Value Date   ALT 19 04/15/2019   AST 17 04/15/2019   ALKPHOS 100 04/15/2019   BILITOT 0.3 04/15/2019   Lab Results  Component Value Date   HGBA1C 6.2 (H) 02/04/2019   HGBA1C 5.9 (H) 08/06/2018   HGBA1C 5.9 (H) 02/18/2018   HGBA1C 6.2 (H) 09/17/2017   Lab Results  Component Value Date   INSULIN 23.8 02/04/2019   INSULIN 20.6 08/06/2018   INSULIN 15.2 02/18/2018   INSULIN 24.2 09/17/2017   Lab Results  Component Value Date   TSH 2.390 09/17/2017   Lab Results  Component Value Date   CHOL 179 02/04/2019   HDL 48 02/04/2019   LDLCALC 70 02/04/2019   TRIG 303 (H) 02/04/2019   Lab Results  Component Value Date   WBC 7.1 04/15/2019   HGB 13.4 04/15/2019   HCT 41.4 04/15/2019   MCV 83.8 04/15/2019   PLT 297  04/15/2019   No results found for: IRON, TIBC, FERRITIN   Ref. Range 02/04/2019 11:21  Vitamin D, 25-Hydroxy Latest Ref Range: 30.0 - 100.0 ng/mL 50.6    Attestation Statements:   Reviewed by clinician on day of visit: allergies, medications, problem list, medical history, surgical history, family history, social history, and previous encounter notes.  Corey Skains, am acting as Location manager for [employer], PA-C.  I have reviewed the above documentation for accuracy and completeness, and I agree with the above. [name], PA-C

## 2019-07-22 MED FILL — ANASTROZOLE 1 MG TABLET: 1 | 90 days supply | Qty: 90 | Fill #2

## 2019-07-26 MED FILL — metFORMIN HCL 500 MG TABS: 500 | 30 days supply | Qty: 60 | Fill #0

## 2019-08-05 ENCOUNTER — Ambulatory Visit (INDEPENDENT_AMBULATORY_CARE_PROVIDER_SITE_OTHER): Payer: 59 | Admitting: Physician Assistant

## 2019-08-05 MED FILL — metFORMIN HCL 500 MG TABS: 500 | 30 days supply | Qty: 60 | Fill #0

## 2019-08-10 ENCOUNTER — Ambulatory Visit (INDEPENDENT_AMBULATORY_CARE_PROVIDER_SITE_OTHER): Payer: 59 | Admitting: Orthopaedic Surgery

## 2019-08-10 ENCOUNTER — Encounter: Payer: Self-pay | Admitting: Orthopaedic Surgery

## 2019-08-10 ENCOUNTER — Other Ambulatory Visit: Payer: Self-pay

## 2019-08-10 DIAGNOSIS — M65332 Trigger finger, left middle finger: Secondary | ICD-10-CM | POA: Diagnosis not present

## 2019-08-10 DIAGNOSIS — M65322 Trigger finger, left index finger: Secondary | ICD-10-CM

## 2019-08-10 DIAGNOSIS — M65321 Trigger finger, right index finger: Secondary | ICD-10-CM | POA: Diagnosis not present

## 2019-08-10 MED ORDER — METHYLPREDNISOLONE ACETATE 40 MG/ML IJ SUSP
20.0000 mg | INTRAMUSCULAR | Status: AC | PRN
  Administered 2019-08-10: 20 mg

## 2019-08-10 MED ORDER — LIDOCAINE HCL 1 % IJ SOLN
0.5000 mL | INTRAMUSCULAR | Status: AC | PRN
  Administered 2019-08-10: .5 mL

## 2019-08-10 NOTE — Progress Notes (Signed)
Office Visit Note   Patient: Candace [name]           Date of Birth: Feb 13, 1961           MRN: [id number] Visit Date: 08/10/2019              Requested by: [name], MD [address],  [place] [address] PCP: [name], MD   Assessment & Plan: Visit Diagnoses:  1. Trigger index finger of left hand   2. Trigger finger, left middle finger   3. Trigger index finger of right hand     Plan: I was able to provide steroid injections in her right index finger as well as left index and middle fingers of the A1 pulley in which she tolerated well.  I will have her try Voltaren gel 2-3 times daily on these areas.  All question concerns were answered and addressed.  Follow-up will be as needed.  Follow-Up Instructions: Return if symptoms worsen or fail to improve.   Orders:  Orders Placed This Encounter  Procedures  . Hand/UE Inj  . Hand/UE Inj  . Hand/UE Inj   No orders of the defined types were placed in this encounter.     Procedures: Hand/UE Inj: R index A1 for trigger finger on 08/10/2019 9:45 AM Medications: 0.5 mL lidocaine 1 %; 20 mg methylPREDNISolone acetate 40 MG/ML  Hand/UE Inj: L index A1 for trigger finger on 08/10/2019 9:46 AM Medications: 0.5 mL lidocaine 1 %; 20 mg methylPREDNISolone acetate 40 MG/ML  Hand/UE Inj: L long A1 for trigger finger on 08/10/2019 9:46 AM Medications: 0.5 mL lidocaine 1 %; 20 mg methylPREDNISolone acetate 40 MG/ML      Clinical Data: No additional findings.   Subjective: Chief Complaint  Patient presents with  . Right Hand - Pain  . Left Hand - Pain  Candace [name] is well-known to the practice.  She has had previous carpal tunnel surgery before.  She does work with her hands a lot and does a lot of typing.  She works for the town system.  She has been having triggering of her left index and middle finger as well as her right index finger.  We have previously injected her right middle finger and that went away  completely.  She would like to have injections today.  I did talk to her about trying Voltaren gel.  She says the triggering is worse in the morning and she does point to the A1 pulley of those areas stating that she has pain.  She denies any numbness and tingling and is done well from her carpal tunnel surgery.  She has had no other active acute changes in her medical status  HPI  Review of Systems .  She currently denies any headache, chest pain, shortness of breath, fever, chills, nausea, vomiting  Objective: Vital Signs: There were no vitals taken for this visit.  Physical Exam She is alert and orient x3 and in no acute distress Ortho Exam Examination of both hands are performed today.  She does have pain over the A1 pulley of her left index and middle fingers as well as the A1 pulley of her right index finger.  There is some triggering as well.  The remainder of her hand exam is normal bilaterally.  All fingers are well-perfused. Specialty Comments:  No specialty comments available.  Imaging: No results found.   PMFS History: Patient Active Problem List   Diagnosis Date Noted  .  Genetic testing 05/13/2016  . Malignant neoplasm of upper-outer quadrant of right breast in female, estrogen receptor positive ([facility]) 05/02/2016  . Family history of breast cancer in sister 04/23/2016  . Depression   . Endometrial hyperplasia   . Oligomenorrhea   . Insomnia   . H/O hemorrhoids   . Epidermal cyst   . High risk HPV infection   . Carpal tunnel syndrome on left 05/28/2011   Past Medical History:  Diagnosis Date  . Abnormal Pap smear   . Alcohol abuse   . Anovulation   . Anxiety   . Breast cancer ([facility]) 04/05/2016   right breast  . Cancer ([facility]) 2017   right breast cancer  . Complication of anesthesia    slow to wake up  . Depression   . Endometrial hyperplasia   . Endometrial polyp    h/o  . Epidermal cyst    h/o  . Gallbladder problem   . GERD (gastroesophageal reflux  disease)   . H/O hemorrhoids   . H/O menorrhagia   . High risk HPV infection   . Hyperlipidemia   . Insomnia   . Obesity   . Oligomenorrhea   . Prediabetes   . Restless legs syndrome   . Seasonal allergies   . Vitamin D deficiency     Family History  Problem Relation Age of Onset  . Heart disease Mother   . Hypertension Mother   . Stroke Mother   . Dementia Mother        vascular dementia  . Hyperlipidemia Mother   . Depression Mother   . Anxiety disorder Mother   . Alcohol abuse Mother   . Obesity Mother   . Pulmonary fibrosis Father        d. 17  . Depression Father   . Anxiety disorder Father   . Alcohol abuse Father   . Heart attack Maternal Grandmother        d. [phone]  . Heart disease Maternal Grandmother   . Heart disease Maternal Grandfather        d. 60-63  . Leukemia Paternal Grandmother        d. late 33s  . Breast cancer Sister 18       IDC s/p mastectomy and chemo    Past Surgical History:  Procedure Laterality Date  . BREAST LUMPECTOMY Right 05/2016   radiation  . BREAST LUMPECTOMY WITH RADIOACTIVE SEED AND SENTINEL LYMPH NODE BIOPSY Right 05/28/2016   Procedure: RADIOACTIVE SEED GUIDED RIGHT BREAST LUMPECTOMY WITH  RIGHT AXILLARY SENTINEL LYMPH NODE BIOPSY;  Surgeon: [name], MD;  Location: [facility];  Service: General;  Laterality: Right;  . CARPAL TUNNEL RELEASE  03/26/2011   right hand  . CARPAL TUNNEL RELEASE  05/28/2011   Procedure: CARPAL TUNNEL RELEASE;  Surgeon: [name];  Location: [facility];  Service: Orthopedics;  Laterality: Left;  . CHOLECYSTECTOMY N/A 01/24/2013   Procedure: LAPAROSCOPIC CHOLECYSTECTOMY WITH INTRAOPERATIVE CHOLANGIOGRAM;  Surgeon: [name], MD;  Location: [facility];  Service: General;  Laterality: N/A;  . COLONOSCOPY    . HYSTEROSCOPY    . POLYPECTOMY    . TONSILLECTOMY  1967   as child  . trigger fingers  8 yrs. ago   both done months apart  . uterine ablation  06/04/2010  . uterine ablation      Social History   Occupational History  . Not on file  Tobacco Use  . Smoking status: Never Smoker  . Smokeless tobacco:  Never Used  Substance and Sexual Activity  . Alcohol use: No    Comment: none since 1996  . Drug use: No  . Sexual activity: Not Currently    Birth control/protection: None

## 2019-08-24 ENCOUNTER — Other Ambulatory Visit: Payer: Self-pay

## 2019-08-24 ENCOUNTER — Ambulatory Visit (INDEPENDENT_AMBULATORY_CARE_PROVIDER_SITE_OTHER): Payer: 59 | Admitting: Physician Assistant

## 2019-08-24 ENCOUNTER — Encounter (INDEPENDENT_AMBULATORY_CARE_PROVIDER_SITE_OTHER): Payer: Self-pay | Admitting: Physician Assistant

## 2019-08-24 VITALS — BP 156/90 | HR 89 | Temp 98.3°F | Ht 67.0 in | Wt 248.0 lb

## 2019-08-24 DIAGNOSIS — Z9189 Other specified personal risk factors, not elsewhere classified: Secondary | ICD-10-CM | POA: Diagnosis not present

## 2019-08-24 DIAGNOSIS — E7849 Other hyperlipidemia: Secondary | ICD-10-CM | POA: Diagnosis not present

## 2019-08-24 DIAGNOSIS — R7303 Prediabetes: Secondary | ICD-10-CM | POA: Diagnosis not present

## 2019-08-24 DIAGNOSIS — Z6838 Body mass index (BMI) 38.0-38.9, adult: Secondary | ICD-10-CM

## 2019-08-24 DIAGNOSIS — G4709 Other insomnia: Secondary | ICD-10-CM | POA: Diagnosis not present

## 2019-08-24 DIAGNOSIS — E559 Vitamin D deficiency, unspecified: Secondary | ICD-10-CM

## 2019-08-24 MED ORDER — METFORMIN HCL 500 MG PO TABS: 500.0000 mg | ORAL_TABLET | Freq: Two times a day (BID) | ORAL | 0 refills | Status: DC

## 2019-08-24 NOTE — Progress Notes (Signed)
Chief Complaint:   OBESITY Candace [name] is here to discuss her progress with her obesity treatment plan along with follow-up of her obesity related diagnoses. Candace [name] is on the Category 4 Plan and states she is following her eating plan approximately 25% of the time. Candace [name] states she is exercising 0 minutes 0 times per week.  Today's visit was #: 76 Starting weight: 249 lbs Starting date: 09/17/2017 Today's weight: 248 lbs Today's date: 08/24/2019 Total lbs lost to date: 1 Total lbs lost since last in-office visit: 0  Interim History: Candace [name] has been under a lot of stress at work and home. She states that she is bored with food and lacking motivation.  Subjective:   Prediabetes. Candace [name] has a diagnosis of prediabetes based on her elevated HgA1c and was informed this puts her at greater risk of developing diabetes. She continues to work on diet and exercise to decrease her risk of diabetes. She denies nausea, vomiting, or diarrhea. Candace [name] is on metformin. No polyphagia. She is due for labs.  Lab Results  Component Value Date   HGBA1C 6.2 (H) 02/04/2019   Lab Results  Component Value Date   INSULIN 23.8 02/04/2019   INSULIN 20.6 08/06/2018   INSULIN 15.2 02/18/2018   INSULIN 24.2 09/17/2017   Other hyperlipidemia. Candace [name] is on Crestor. No chest pain. No myalgias. She is not exercising. Is due for labs.   Lab Results  Component Value Date   CHOL 179 02/04/2019   HDL 48 02/04/2019   LDLCALC 70 02/04/2019   TRIG 303 (H) 02/04/2019   Lab Results  Component Value Date   ALT 19 04/15/2019   AST 17 04/15/2019   ALKPHOS 100 04/15/2019   BILITOT 0.3 04/15/2019   The 10-year ASCVD risk score Candace [name] DC Jr., et al., 2013) is: 4%   Values used to calculate the score:     Age: 38 years     Sex: Female     Is Non-Hispanic African American: No     Diabetic: No     Tobacco smoker: No     Systolic Blood Pressure: [id number] mmHg     Is BP treated: No     HDL Cholesterol: 48 mg/dL   Total Cholesterol: 179 mg/dL  Vitamin D deficiency. Candace [name] is on Vitamin D daily 5,000 units. No nausea, vomiting, or muscle weakness. Last Vitamin D level 50.6 on 02/04/2019. She is due for labs.  Other insomnia. Candace [name] reports sleepiness during the daytime and is only sleeping 4-5 hours at night. Her family reports that she snores.  At risk for obstructive sleep apnea. Candace [name] is at increased risk for sleep apnea due to obesity.    Assessment/Plan:   Prediabetes. Candace [name] will continue to work on weight loss, exercise, and decreasing simple carbohydrates to help decrease the risk of diabetes. She was given a refill on her metFORMIN (GLUCOPHAGE) 500 MG tablet #60 with 0 refills.  Other hyperlipidemia. Cardiovascular risk and specific lipid/LDL goals reviewed.  We discussed several lifestyle modifications today and Candace [name] will continue to work on diet, exercise and weight loss efforts. Orders and follow up as documented in patient record. She will continue her medications as directed.  Counseling Intensive lifestyle modifications are the first line treatment for this issue. . Dietary changes: Increase soluble fiber. Decrease simple carbohydrates. . Exercise changes: Moderate to vigorous-intensity aerobic activity 150 minutes per week if tolerated. . Lipid-lowering medications: see documented in medical record.  Vitamin D deficiency. Low Vitamin D level  contributes to fatigue and are associated with obesity, breast, and colon cancer. She agrees to continue to take Vitamin D and will follow-up for routine testing of Vitamin D, at least 2-3 times per year to avoid over-replacement.  Other insomnia. Candace [name] is at risk for OSA. We discussed sending her for a sleep study today and she is referred for a sleep study.   At risk for obstructive sleep apnea. Candace [name] was given approximately 15 minutes of coronary artery disease prevention counseling today. She is 59 y.o. female and has risk factors for  obstructive sleep apnea including obesity. We discussed intensive lifestyle modifications today with an emphasis on specific weight loss instructions and strategies.  Repetitive spaced learning was employed today to elicit superior memory formation and behavioral change.  Class 2 severe obesity with serious comorbidity and body mass index (BMI) of 38.0 to 38.9 in adult, unspecified obesity type ([name]).  Candace [name] is currently in the action stage of change. As such, her goal is to continue with weight loss efforts. She has agreed to the Category 4 Plan and journal 550-700 calories + 45 grams of protein at supper..   Exercise goals: For substantial health benefits, adults should do at least 150 minutes (2 hours and 30 minutes) a week of moderate-intensity, or 75 minutes (1 hour and 15 minutes) a week of vigorous-intensity aerobic physical activity, or an equivalent combination of moderate- and vigorous-intensity aerobic activity. Aerobic activity should be performed in episodes of at least 10 minutes, and preferably, it should be spread throughout the week.  Behavioral modification strategies: meal planning and cooking strategies and keeping healthy foods in the home.  Candace [name] has agreed to follow-up with our clinic in 2 weeks. She was informed of the importance of frequent follow-up visits to maximize her success with intensive lifestyle modifications for her multiple health conditions.   Objective:   Blood pressure (!) 156/90, pulse 89, temperature 98.3 F (36.8 C), temperature source Oral, height 5\' 7"  (1.702 m), weight 248 lb (112.5 kg), SpO2 97 %. Body mass index is 38.84 kg/m.  General: Cooperative, alert, well developed, in no acute distress. HEENT: Conjunctivae and lids unremarkable. Cardiovascular: Regular rhythm.  Lungs: Normal work of breathing. Neurologic: No focal deficits.   Lab Results  Component Value Date   CREATININE 0.87 04/15/2019   BUN 17 04/15/2019   NA 141 04/15/2019    K 4.1 04/15/2019   CL 105 04/15/2019   CO2 24 04/15/2019   Lab Results  Component Value Date   ALT 19 04/15/2019   AST 17 04/15/2019   ALKPHOS 100 04/15/2019   BILITOT 0.3 04/15/2019   Lab Results  Component Value Date   HGBA1C 6.2 (H) 02/04/2019   HGBA1C 5.9 (H) 08/06/2018   HGBA1C 5.9 (H) 02/18/2018   HGBA1C 6.2 (H) 09/17/2017   Lab Results  Component Value Date   INSULIN 23.8 02/04/2019   INSULIN 20.6 08/06/2018   INSULIN 15.2 02/18/2018   INSULIN 24.2 09/17/2017   Lab Results  Component Value Date   TSH 2.390 09/17/2017   Lab Results  Component Value Date   CHOL 179 02/04/2019   HDL 48 02/04/2019   LDLCALC 70 02/04/2019   TRIG 303 (H) 02/04/2019   Lab Results  Component Value Date   WBC 7.1 04/15/2019   HGB 13.4 04/15/2019   HCT 41.4 04/15/2019   MCV 83.8 04/15/2019   PLT 297 04/15/2019   No results found for: IRON, TIBC, FERRITIN  Attestation Statements:   Reviewed by  clinician on day of visit: allergies, medications, problem list, medical history, surgical history, family history, social history, and previous encounter notes.  IMichaelene Song, am acting as transcriptionist for [name], PA-C   I have reviewed the above documentation for accuracy and completeness, and I agree with the above. [name], PA-C

## 2019-09-08 ENCOUNTER — Encounter (INDEPENDENT_AMBULATORY_CARE_PROVIDER_SITE_OTHER): Payer: Self-pay | Admitting: Physician Assistant

## 2019-09-08 ENCOUNTER — Ambulatory Visit (INDEPENDENT_AMBULATORY_CARE_PROVIDER_SITE_OTHER): Payer: 59 | Admitting: Physician Assistant

## 2019-09-08 ENCOUNTER — Other Ambulatory Visit: Payer: Self-pay

## 2019-09-08 VITALS — BP 147/87 | HR 88 | Temp 98.3°F | Ht 67.0 in | Wt 246.0 lb

## 2019-09-08 DIAGNOSIS — R7303 Prediabetes: Secondary | ICD-10-CM

## 2019-09-08 DIAGNOSIS — Z9189 Other specified personal risk factors, not elsewhere classified: Secondary | ICD-10-CM

## 2019-09-08 DIAGNOSIS — Z6838 Body mass index (BMI) 38.0-38.9, adult: Secondary | ICD-10-CM

## 2019-09-08 DIAGNOSIS — E559 Vitamin D deficiency, unspecified: Secondary | ICD-10-CM | POA: Diagnosis not present

## 2019-09-08 DIAGNOSIS — E7849 Other hyperlipidemia: Secondary | ICD-10-CM | POA: Diagnosis not present

## 2019-09-08 DIAGNOSIS — E66812 Obesity, class 2: Secondary | ICD-10-CM

## 2019-09-08 NOTE — Progress Notes (Signed)
Chief Complaint:   OBESITY Candace [name] is here to discuss her progress with her obesity treatment plan along with follow-up of her obesity related diagnoses. Candace [name] is on the Category 4 Plan and states she is following her eating plan approximately 65% of the time. Candace [name] states she is walking 15-20 minutes 4 times per week.  Today's visit was #: 54 Starting weight: 249 lbs Starting date: 09/17/2017 Today's weight: 246 lbs Today's date: 09/08/2019 Total lbs lost to date: 3 Total lbs lost since last in-office visit: 2  Interim History: Candace [name] reports that she has done more meal planning the last 2 weeks. She has also started walking.  Subjective:   Other hyperlipidemia. Candace [name] is on Crestor. No chest pain. No headache. She is exercising regularly.   Lab Results  Component Value Date   CHOL 179 02/04/2019   HDL 48 02/04/2019   LDLCALC 70 02/04/2019   TRIG 303 (H) 02/04/2019   Lab Results  Component Value Date   ALT 19 04/15/2019   AST 17 04/15/2019   ALKPHOS 100 04/15/2019   BILITOT 0.3 04/15/2019   The 10-year ASCVD risk score Candace Bussing DC Jr., et al., 2013) is: 3.5%   Values used to calculate the score:     Age: 59 years     Sex: Female     Is Non-Hispanic African American: No     Diabetic: No     Tobacco smoker: No     Systolic Blood Pressure: [id number] mmHg     Is BP treated: No     HDL Cholesterol: 48 mg/dL     Total Cholesterol: 179 mg/dL  Prediabetes. Candace [name] has a diagnosis of prediabetes based on her elevated HgA1c and was informed this puts her at greater risk of developing diabetes. She continues to work on diet and exercise to decrease her risk of diabetes. She denies nausea, vomiting, or diarrhea. Candace [name] is on metformin. No polyphagia.  Lab Results  Component Value Date   HGBA1C 6.2 (H) 02/04/2019   Lab Results  Component Value Date   INSULIN 23.8 02/04/2019   INSULIN 20.6 08/06/2018   INSULIN 15.2 02/18/2018   INSULIN 24.2 09/17/2017   Vitamin D  deficiency. Candace [name] is on OTC Vitamin D. No nausea, vomiting, or muscle weakness. Last Vitamin D 50.6 on 02/04/2019.   At risk for hypoglycemia. Adalia is at increased risk for hypoglycemia due to changes in diet, diagnosis of diabetes, and/or insulin use.   Assessment/Plan:   Other hyperlipidemia. Cardiovascular risk and specific lipid/LDL goals reviewed.  We discussed several lifestyle modifications today and Candace [name] will continue to work on diet, exercise and weight loss efforts. Orders and follow up as documented in patient record. Candace [name] will continue Crestor as directed. Comprehensive metabolic panel, Hemoglobin A1c, Insulin, random, Lipid Panel With LDL/HDL Ratio labs ordered.  Counseling Intensive lifestyle modifications are the first line treatment for this issue. . Dietary changes: Increase soluble fiber. Decrease simple carbohydrates. . Exercise changes: Moderate to vigorous-intensity aerobic activity 150 minutes per week if tolerated. . Lipid-lowering medications: see documented in medical record.    Prediabetes. Candace [name] will continue to work on weight loss, exercise, and decreasing simple carbohydrates to help decrease the risk of diabetes. She will continue her medication as directed and continue exercise and weight loss. She is at risk of hypoglycemia due to prediabetes. Comprehensive metabolic panel, Hemoglobin A1c, Insulin, random, Lipid Panel With LDL/HDL Ratio labs ordered.  Vitamin D deficiency.   VITAMIN D 25 Hydroxy (  Vit-D Deficiency, Fractures)  At risk for hypoglycemia. Candace [name] was given approximately 15 minutes of counseling today regarding prevention of hypoglycemia. She was advised of symptoms of hypoglycemia. Candace [name] was instructed to avoid skipping meals, eat regular protein rich meals and schedule low calorie snacks as needed.   Repetitive spaced learning was employed today to elicit superior memory formation and behavioral change.  Class 2 severe obesity with serious  comorbidity and body mass index (BMI) of 38.0 to 38.9 in adult, unspecified obesity type (Candace [name]).  Candace [name] is currently in the action stage of change. As such, her goal is to continue with weight loss efforts. She has agreed to the Category 4 Plan.   Exercise goals: For substantial health benefits, adults should do at least 150 minutes (2 hours and 30 minutes) a week of moderate-intensity, or 75 minutes (1 hour and 15 minutes) a week of vigorous-intensity aerobic physical activity, or an equivalent combination of moderate- and vigorous-intensity aerobic activity. Aerobic activity should be performed in episodes of at least 10 minutes, and preferably, it should be spread throughout the week.  Behavioral modification strategies: meal planning and cooking strategies and keeping healthy foods in the home.  Candace [name] has agreed to follow-up with our clinic in 2-3 weeks. She was informed of the importance of frequent follow-up visits to maximize her success with intensive lifestyle modifications for her multiple health conditions.   Candace [name] was informed we would discuss her lab results at her next visit unless there is a critical issue that needs to be addressed sooner. Candace [name] agreed to keep her next visit at the agreed upon time to discuss these results.  Objective:   Blood pressure (!) 147/87, pulse 88, temperature 98.3 F (36.8 C), temperature source Oral, height 5\' 7"  (1.702 m), weight 246 lb (111.6 kg), SpO2 97 %. Body mass index is 38.53 kg/m.  General: Cooperative, alert, well developed, in no acute distress. HEENT: Conjunctivae and lids unremarkable. Cardiovascular: Regular rhythm.  Lungs: Normal work of breathing. Neurologic: No focal deficits.   Lab Results  Component Value Date   CREATININE 0.87 04/15/2019   BUN 17 04/15/2019   NA 141 04/15/2019   K 4.1 04/15/2019   CL 105 04/15/2019   CO2 24 04/15/2019   Lab Results  Component Value Date   ALT 19 04/15/2019   AST 17 04/15/2019    ALKPHOS 100 04/15/2019   BILITOT 0.3 04/15/2019   Lab Results  Component Value Date   HGBA1C 6.2 (H) 02/04/2019   HGBA1C 5.9 (H) 08/06/2018   HGBA1C 5.9 (H) 02/18/2018   HGBA1C 6.2 (H) 09/17/2017   Lab Results  Component Value Date   INSULIN 23.8 02/04/2019   INSULIN 20.6 08/06/2018   INSULIN 15.2 02/18/2018   INSULIN 24.2 09/17/2017   Lab Results  Component Value Date   TSH 2.390 09/17/2017   Lab Results  Component Value Date   CHOL 179 02/04/2019   HDL 48 02/04/2019   LDLCALC 70 02/04/2019   TRIG 303 (H) 02/04/2019   Lab Results  Component Value Date   WBC 7.1 04/15/2019   HGB 13.4 04/15/2019   HCT 41.4 04/15/2019   MCV 83.8 04/15/2019   PLT 297 04/15/2019   No results found for: IRON, TIBC, FERRITIN  Attestation Statements:   Reviewed by clinician on day of visit: allergies, medications, problem list, medical history, surgical history, family history, social history, and previous encounter notes.  Migdalia Dk, am acting as Location manager for [name], PA-C   I have  reviewed the above documentation for accuracy and completeness, and I agree with the above. [name], PA-C

## 2019-09-09 LAB — LIPID PANEL WITH LDL/HDL RATIO
Cholesterol, Total: 195 mg/dL (ref 100–199)
HDL: 55 mg/dL (ref >39)
LDL Chol Calc (NIH): 95 mg/dL (ref 0–99)
LDL/HDL Ratio: 1.7 ratio (ref 0.0–3.2)
Triglycerides: 271 mg/dL — ABNORMAL HIGH (ref 0–149)
VLDL Cholesterol Cal: 45 mg/dL — ABNORMAL HIGH (ref 5–40)

## 2019-09-09 LAB — HEMOGLOBIN A1C
Est. average glucose Bld gHb Est-mCnc: 126 mg/dL
Hgb A1c MFr Bld: 6 % — ABNORMAL HIGH (ref 4.8–5.6)

## 2019-09-09 LAB — COMPREHENSIVE METABOLIC PANEL
ALT: 29 IU/L (ref 0–32)
AST: 22 IU/L (ref 0–40)
Albumin/Globulin Ratio: 2.1 (ref 1.2–2.2)
Albumin: 4.7 g/dL (ref 3.8–4.9)
Alkaline Phosphatase: 101 IU/L (ref 39–117)
BUN/Creatinine Ratio: 15 (ref 9–23)
BUN: 11 mg/dL (ref 6–24)
Bilirubin Total: 0.3 mg/dL (ref 0.0–1.2)
CO2: 24 mmol/L (ref 20–29)
Calcium: 10 mg/dL (ref 8.7–10.2)
Chloride: 100 mmol/L (ref 96–106)
Creatinine, Ser: 0.72 mg/dL (ref 0.57–1.00)
GFR calc Af Amer: 107 mL/min/{1.73_m2} (ref >59)
GFR calc non Af Amer: 93 mL/min/{1.73_m2} (ref >59)
Globulin, Total: 2.2 g/dL (ref 1.5–4.5)
Glucose: 99 mg/dL (ref 65–99)
Potassium: 4.3 mmol/L (ref 3.5–5.2)
Sodium: 141 mmol/L (ref 134–144)
Total Protein: 6.9 g/dL (ref 6.0–8.5)

## 2019-09-09 LAB — VITAMIN D 25 HYDROXY (VIT D DEFICIENCY, FRACTURES): Vit D, 25-Hydroxy: 42.5 ng/mL (ref 30.0–100.0)

## 2019-09-09 LAB — INSULIN, RANDOM: INSULIN: 12.6 u[IU]/mL (ref 2.6–24.9)

## 2019-09-10 MED FILL — metFORMIN HCL 500 MG TABS: 500 | 30 days supply | Qty: 60 | Fill #0

## 2019-09-15 ENCOUNTER — Other Ambulatory Visit: Payer: Self-pay

## 2019-09-15 ENCOUNTER — Ambulatory Visit: Payer: 59 | Admitting: Neurology

## 2019-09-15 ENCOUNTER — Encounter: Payer: Self-pay | Admitting: Neurology

## 2019-09-15 VITALS — BP 160/88 | HR 88 | Temp 97.1°F | Ht 66.5 in | Wt 250.0 lb

## 2019-09-15 DIAGNOSIS — R351 Nocturia: Secondary | ICD-10-CM | POA: Diagnosis not present

## 2019-09-15 DIAGNOSIS — E669 Obesity, unspecified: Secondary | ICD-10-CM | POA: Diagnosis not present

## 2019-09-15 DIAGNOSIS — G2581 Restless legs syndrome: Secondary | ICD-10-CM

## 2019-09-15 DIAGNOSIS — G4700 Insomnia, unspecified: Secondary | ICD-10-CM

## 2019-09-15 DIAGNOSIS — R519 Headache, unspecified: Secondary | ICD-10-CM | POA: Diagnosis not present

## 2019-09-15 DIAGNOSIS — R0683 Snoring: Secondary | ICD-10-CM

## 2019-09-15 DIAGNOSIS — G479 Sleep disorder, unspecified: Secondary | ICD-10-CM

## 2019-09-15 NOTE — Progress Notes (Addendum)
Subjective:    Patient ID: Candace [name] is a 59 y.o. female.  HPI     Star Age, MD, PhD [facility] [address], [address]O. [place], [place] [address]  Dear Candace [name],    I saw your patient, Candace [name], upon your kind request, in my sleep clinic today for initial consultation of her sleep disorder, in particular, Concern for underlying obstructive sleep apnea.  The patient is unaccompanied today.  As you know, [name] is a 59 year old right-handed woman with an underlying medical history of prediabetes, seasonal allergies, restless leg syndrome, reflux disease, hyperlipidemia, depression, anxiety, breast cancer, with status post radiation treatment after lumpectomy, vitamin D deficiency and obesity, who reports snoring and excessive daytime somnolence at times, and a longer standing history of difficulty initiating and maintaining sleep.  She has restless leg syndrome and takes Requip for this, it may take 3 hours to take effect.  She denies any gasping sensations or witnessed apneas but has been sleeping alone.  She is divorced.  She lives with her 2 teenage daughters, ages 78 and 57.  They have 3 dogs in the household, 2 of them sleep on the bed with her, she has a king-size bed.  She does have a TV on at night.  It does not have to be on in her bedroom all night long.  She does look at her phone when she cannot sleep through the night.  She has nocturia about once or twice per average night and has woken up with the occasional morning headache.  Bedtime is generally around 11 PM and rise time around 6 AM.  She works 12-hour shifts on Saturdays, Sundays and Mondays.  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  She drinks caffeine in the form of coffee, 3 cups in the morning and one soda during the day.  She is a non-smoker, she reports that she quit drinking alcohol in her 22s.  She sees her oncologist once a year, she is on Arimidex.  I reviewed your  office note from 08/24/2019.  Her Epworth sleepiness score is 8 out of 24, fatigue severity score is 23 out of 63.  Her Past Medical History Is Significant For: Past Medical History:  Diagnosis Date  . Abnormal Pap smear   . Alcohol abuse   . Anovulation   . Anxiety   . Breast cancer ([place]) 04/05/2016   right breast  . Cancer ([place]) 2017   right breast cancer  . Complication of anesthesia    slow to wake up  . Depression   . Endometrial hyperplasia   . Endometrial polyp    h/o  . Epidermal cyst    h/o  . Gallbladder problem   . GERD (gastroesophageal reflux disease)   . H/O hemorrhoids   . H/O menorrhagia   . High risk HPV infection   . Hyperlipidemia   . Insomnia   . Obesity   . Oligomenorrhea   . Prediabetes   . Restless legs syndrome   . Seasonal allergies   . Vitamin D deficiency     Her Past Surgical History Is Significant For: Past Surgical History:  Procedure Laterality Date  . BREAST LUMPECTOMY Right 05/2016   radiation  . BREAST LUMPECTOMY WITH RADIOACTIVE SEED AND SENTINEL LYMPH NODE BIOPSY Right 05/28/2016   Procedure: RADIOACTIVE SEED GUIDED RIGHT BREAST LUMPECTOMY WITH  RIGHT AXILLARY SENTINEL LYMPH NODE BIOPSY;  Surgeon: [name], MD;  Location: [facility];  Service:  General;  Laterality: Right;  . CARPAL TUNNEL RELEASE  03/26/2011   right hand  . CARPAL TUNNEL RELEASE  05/28/2011   Procedure: CARPAL TUNNEL RELEASE;  Surgeon: [name];  Location: [facility];  Service: Orthopedics;  Laterality: Left;  . CHOLECYSTECTOMY N/A 01/24/2013   Procedure: LAPAROSCOPIC CHOLECYSTECTOMY WITH INTRAOPERATIVE CHOLANGIOGRAM;  Surgeon: [name], MD;  Location: [facility];  Service: General;  Laterality: N/A;  . COLONOSCOPY    . HYSTEROSCOPY    . POLYPECTOMY    . TONSILLECTOMY  1967   as child  . trigger fingers  8 yrs. ago   both done months apart  . uterine ablation  06/04/2010  . uterine ablation      Her Family History Is Significant  For: Family History  Problem Relation Age of Onset  . Heart disease Mother   . Hypertension Mother   . Stroke Mother   . Dementia Mother        vascular dementia  . Hyperlipidemia Mother   . Depression Mother   . Anxiety disorder Mother   . Alcohol abuse Mother   . Obesity Mother   . Pulmonary fibrosis Father        d. 2  . Depression Father   . Anxiety disorder Father   . Alcohol abuse Father   . Heart attack Maternal Grandmother        d. [phone]  . Heart disease Maternal Grandmother   . Heart disease Maternal Grandfather        d. 60-63  . Leukemia Paternal Grandmother        d. late 53s  . Breast cancer Sister 68       IDC s/p mastectomy and chemo    Her Social History Is Significant For: Social History   Socioeconomic History  . Marital status: Divorced    Spouse name: Not on file  . Number of children: 2  . Years of education: Not on file  . Highest education level: Not on file  Occupational History  . Not on file  Tobacco Use  . Smoking status: Never Smoker  . Smokeless tobacco: Never Used  Substance and Sexual Activity  . Alcohol use: No    Comment: none since 1996  . Drug use: No  . Sexual activity: Not Currently    Birth control/protection: None  Other Topics Concern  . Not on file  Social History Narrative  . Not on file   Social Determinants of Health   Financial Resource Strain:   . Difficulty of Paying Living Expenses:   Food Insecurity:   . Worried About Charity fundraiser in the Last Year:   . Arboriculturist in the Last Year:   Transportation Needs:   . Film/video editor (Medical):   [name] Lack of Transportation (Non-Medical):   Physical Activity:   . Days of Exercise per Week:   . Minutes of Exercise per Session:   Stress:   . Feeling of Stress :   Social Connections:   . Frequency of Communication with Friends and Family:   . Frequency of Social Gatherings with Friends and Family:   . Attends Religious Services:   . Active  Member of Clubs or Organizations:   . Attends Archivist Meetings:   [name] Marital Status:     Her Allergies Are:  No Known Allergies:   Her Current Medications Are:  Outpatient Encounter Medications as of 09/15/2019  Medication Sig  . anastrozole (  ARIMIDEX) 1 MG tablet Take 1 tablet (1 mg total) by mouth daily.  [name] buPROPion (WELLBUTRIN XL) 300 MG 24 hr tablet Take 300 mg by mouth daily.   . cetirizine (ZYRTEC) 10 MG tablet Take 10 mg by mouth daily. [facility]  . Cholecalciferol (VITAMIN D-3) 125 MCG (5000 UT) TABS Take 5,000 Units by mouth daily. (Patient taking differently: Take 50 mg by mouth daily. )  . esomeprazole (NEXIUM) 40 MG capsule Take 40 mg by mouth every 36 (thirty-six) hours.  [name] FLUoxetine (PROZAC) 10 MG capsule Take 10 mg by mouth daily with lunch.   . metFORMIN (GLUCOPHAGE) 500 MG tablet Take 1 tablet (500 mg total) by mouth 2 (two) times daily with a meal.  . Multiple Vitamin (MULTIVITAMIN WITH MINERALS) TABS tablet Take 1 tablet by mouth daily.  [name] rOPINIRole (REQUIP) 0.25 MG tablet Take 0.5 mg by mouth daily at 8 pm.  . rosuvastatin (CRESTOR) 10 MG tablet Take 10 mg by mouth at bedtime.   No facility-administered encounter medications on file as of 09/15/2019.  :  Review of Systems:  Out of a complete 14 point review of systems, all are reviewed and negative with the exception of these symptoms as listed below: Review of Systems  Neurological:       Pt here today for sleep consult, never had study, does snore   Epworth Sleepiness Scale 0= would never doze 1= slight chance of dozing 2= moderate chance of dozing 3= high chance of dozing  Sitting and reading:2 Watching TV:2 Sitting inactive in a public place (ex. Theater or meeting):1 As a passenger in a car for an hour without a break:0 Lying down to rest in the afternoon:1 Sitting and talking to someone:0 Sitting quietly after lunch (no alcohol):1 In a car, while stopped in  traffic:1 Total:8    Objective:  Neurological Exam  Physical Exam Physical Examination:   Vitals:   09/15/19 1059  BP: (!) 160/88  Pulse: 88  Temp: (!) 97.1 F (36.2 C)    General Examination: The patient is a very pleasant 59 y.o. female in no acute distress. She appears well-developed and well-nourished and well groomed.   HEENT: Normocephalic, atraumatic, pupils are equal, round and reactive to light, extraocular tracking is good without limitation to gaze excursion or nystagmus noted.  Corrective eyeglasses in place. Hearing is grossly intact. Face is symmetric with normal facial animation. Speech is clear with no dysarthria noted. There is no hypophonia. There is no lip, neck/head, jaw or voice tremor. Neck is supple with full range of passive and active motion. There are no carotid bruits on auscultation. Oropharynx exam reveals: mild mouth dryness, adequate dental hygiene and moderate airway crowding, due to smaller airway entry, Mallampati class III, redundant soft palate, tonsils absent, neck circumference 16 inches.  She has a minimal overbite.  Tongue protrudes centrally in palate elevates symmetrically.  Chest: Clear to auscultation without wheezing, rhonchi or crackles noted.  Heart: S1+S2+0, regular and normal without murmurs, rubs or gallops noted.   Abdomen: Soft, non-tender and non-distended with normal bowel sounds appreciated on auscultation.  Extremities: There is no pitting edema in the distal lower extremities bilaterally.   Skin: Warm and dry without trophic changes noted.   Musculoskeletal: exam reveals no obvious joint deformities, tenderness or joint swelling or erythema.   Neurologically:  Mental status: The patient is awake, alert and oriented in all 4 spheres. Her immediate and remote memory, attention, language skills and fund of knowledge are  appropriate. There is no evidence of aphasia, agnosia, apraxia or anomia. Speech is clear with normal prosody  and enunciation. Thought process is linear. Mood is normal and affect is normal.  Cranial nerves II - XII are as described above under HEENT exam.  Motor exam: Normal bulk, strength and tone is noted. There is no tremor, Romberg is negative. Fine motor skills and coordination: grossly intact.  Cerebellar testing: No dysmetria or intention tremor. There is no truncal or gait ataxia.  Sensory exam: intact to light touch in the upper and lower extremities.  Gait, station and balance: She stands easily. No veering to one side is noted. No leaning to one side is noted. Posture is age-appropriate and stance is narrow based. Gait shows normal stride length and normal pace. No problems turning are noted. Tandem walk is challenging for her.                 Assessment and Plan:    In summary, Cayle Depaul Antoniewicz is a very pleasant 59 y.o.-year old female  with an underlying medical history of prediabetes, seasonal allergies, restless leg syndrome, reflux disease, hyperlipidemia, depression, anxiety, breast cancer, with status post radiation treatment after lumpectomy, vitamin D deficiency and obesity, whose history and physical exam concerning for obstructive sleep apnea (OSA). I had a long chat with the patient about my findings and the diagnosis of OSA, its prognosis and treatment options. We talked about medical treatments, surgical interventions and non-pharmacological approaches. I explained in particular the risks and ramifications of untreated moderate to severe OSA, especially with respect to developing cardiovascular disease down the Road, including congestive heart failure, difficult to treat hypertension, cardiac arrhythmias, or stroke. Even type 2 diabetes has, in part, been linked to untreated OSA. Symptoms of untreated OSA include daytime sleepiness, memory problems, mood irritability and mood disorder such as depression and anxiety, lack of energy, as well as recurrent headaches, especially morning  headaches. We talked about trying to maintain a healthy lifestyle in general, as well as the importance of weight control. We also talked about the importance of good sleep hygiene. I recommended the following at this time: sleep study.   I explained the sleep test procedure to the patient and also outlined possible surgical and non-surgical treatment options of OSA, including the use of a custom-made dental device (which would require a referral to a specialist dentist or oral surgeon), upper airway surgical options, such as traditional UPPP or a novel less invasive surgical option in the form of Inspire hypoglossal nerve stimulation (which would involve a referral to an ENT surgeon). I also explained the CPAP treatment option to the patient, who indicated that she would be willing to try CPAP if the need arises. I explained the importance of being compliant with PAP treatment, not only for insurance purposes but primarily to improve Her symptoms, and for the patient's long term health benefit, including to reduce Her cardiovascular risks. I answered all her questions today and the patient was in agreement. I plan to see her back after the sleep study is completed and encouraged her to call with any interim questions, concerns, problems or updates.   Thank you very much for allowing me to participate in the care of this nice patient. If I can be of any further assistance to you please do not hesitate to call me at [phone].  Sincerely,   Star Age, MD, PhD

## 2019-09-15 NOTE — Patient Instructions (Signed)

## 2019-09-22 MED FILL — rOPINIRole HCL 0.5 MG TABS: 0.5 | 90 days supply | Qty: 90 | Fill #3

## 2019-09-22 MED FILL — BUPROPION HCL ER (XL) 300 M: 300 | 90 days supply | Qty: 90 | Fill #0

## 2019-09-22 MED FILL — FLUoxetine HCL 10 MG TABS: 10 | 90 days supply | Qty: 90 | Fill #0

## 2019-09-22 MED FILL — ROSUVASTATIN CALCIUM 10 MG: 10 | 90 days supply | Qty: 90 | Fill #0

## 2019-10-05 ENCOUNTER — Encounter (INDEPENDENT_AMBULATORY_CARE_PROVIDER_SITE_OTHER): Payer: Self-pay | Admitting: Physician Assistant

## 2019-10-05 ENCOUNTER — Other Ambulatory Visit: Payer: Self-pay

## 2019-10-05 ENCOUNTER — Ambulatory Visit (INDEPENDENT_AMBULATORY_CARE_PROVIDER_SITE_OTHER): Payer: 59 | Admitting: Neurology

## 2019-10-05 ENCOUNTER — Ambulatory Visit (INDEPENDENT_AMBULATORY_CARE_PROVIDER_SITE_OTHER): Payer: 59 | Admitting: Physician Assistant

## 2019-10-05 VITALS — BP 158/88 | HR 89 | Temp 97.9°F | Ht 67.0 in | Wt 249.0 lb

## 2019-10-05 DIAGNOSIS — R0683 Snoring: Secondary | ICD-10-CM

## 2019-10-05 DIAGNOSIS — G4733 Obstructive sleep apnea (adult) (pediatric): Secondary | ICD-10-CM

## 2019-10-05 DIAGNOSIS — R7303 Prediabetes: Secondary | ICD-10-CM | POA: Diagnosis not present

## 2019-10-05 DIAGNOSIS — Z9189 Other specified personal risk factors, not elsewhere classified: Secondary | ICD-10-CM

## 2019-10-05 DIAGNOSIS — G479 Sleep disorder, unspecified: Secondary | ICD-10-CM

## 2019-10-05 DIAGNOSIS — Z6839 Body mass index (BMI) 39.0-39.9, adult: Secondary | ICD-10-CM | POA: Diagnosis not present

## 2019-10-05 DIAGNOSIS — E7849 Other hyperlipidemia: Secondary | ICD-10-CM | POA: Diagnosis not present

## 2019-10-05 DIAGNOSIS — R351 Nocturia: Secondary | ICD-10-CM

## 2019-10-05 DIAGNOSIS — E669 Obesity, unspecified: Secondary | ICD-10-CM

## 2019-10-05 DIAGNOSIS — G4720 Circadian rhythm sleep disorder, unspecified type: Secondary | ICD-10-CM

## 2019-10-05 DIAGNOSIS — R519 Headache, unspecified: Secondary | ICD-10-CM

## 2019-10-05 DIAGNOSIS — G2581 Restless legs syndrome: Secondary | ICD-10-CM

## 2019-10-05 DIAGNOSIS — G4761 Periodic limb movement disorder: Secondary | ICD-10-CM

## 2019-10-05 DIAGNOSIS — G4700 Insomnia, unspecified: Secondary | ICD-10-CM

## 2019-10-05 MED ORDER — METFORMIN HCL 500 MG PO TABS: 500.0000 mg | ORAL_TABLET | Freq: Two times a day (BID) | ORAL | 0 refills | Status: DC

## 2019-10-05 MED FILL — metFORMIN HCL 500 MG TABS: 500 | 30 days supply | Qty: 60 | Fill #0

## 2019-10-05 NOTE — Progress Notes (Signed)
Chief Complaint:   OBESITY Candace [name] is here to discuss her progress with her obesity treatment plan along with follow-up of her obesity related diagnoses. Candace [name] is on the Category 4 Plan and states she is following her eating plan approximately 25% of the time. Candace [name] states she is exercising for 0 minutes 0 times per week.  Today's visit was #: 62 Starting weight: 249 lbs Starting date: 09/17/2017 Today's weight: 249 lbs Today's date: 10/05/2019 Total lbs lost to date: 0 Total lbs lost since last in-office visit: 0  Interim History: Candace [name] was in [place] for a week and did not eat on plan for some meals.  She has a sleep study scheduled for tonight.  Subjective:   1. Prediabetes Candace [name] has a diagnosis of prediabetes based on her elevated HgA1c and was informed this puts her at greater risk of developing diabetes. She continues to work on diet and exercise to decrease her risk of diabetes. She denies nausea or hypoglycemia.  She is taking metformin.  No nausea, vomiting, diarrhea, or polyphagia.  Lab Results  Component Value Date   HGBA1C 6.0 (H) 09/08/2019   Lab Results  Component Value Date   INSULIN 12.6 09/08/2019   INSULIN 23.8 02/04/2019   INSULIN 20.6 08/06/2018   INSULIN 15.2 02/18/2018   INSULIN 24.2 09/17/2017   2. Other hyperlipidemia Candace [name] has hyperlipidemia and has been trying to improve her cholesterol levels with intensive lifestyle modification including a low saturated fat diet, exercise and weight loss. She denies any chest pain, claudication or myalgias.  She is taking Crestor.  Lab Results  Component Value Date   ALT 29 09/08/2019   AST 22 09/08/2019   ALKPHOS 101 09/08/2019   BILITOT 0.3 09/08/2019   Lab Results  Component Value Date   CHOL 195 09/08/2019   HDL 55 09/08/2019   LDLCALC 95 09/08/2019   TRIG 271 (H) 09/08/2019   3. At risk for diabetes mellitus Candace [name] is at higher than average risk for developing diabetes due to her obesity.    Assessment/Plan:   1. Prediabetes Candace [name] will continue to work on weight loss, exercise, and decreasing simple carbohydrates to help decrease the risk of diabetes.  - metFORMIN (GLUCOPHAGE) 500 MG tablet; Take 1 tablet (500 mg total) by mouth 2 (two) times daily with a meal.  Dispense: 60 tablet; Refill: 0  2. Other hyperlipidemia Cardiovascular risk and specific lipid/LDL goals reviewed.  We discussed several lifestyle modifications today and Candace [name] will continue to work on diet, exercise and weight loss efforts. Orders and follow up as documented in patient record.   Counseling Intensive lifestyle modifications are the first line treatment for this issue. . Dietary changes: Increase soluble fiber. Decrease simple carbohydrates. . Exercise changes: Moderate to vigorous-intensity aerobic activity 150 minutes per week if tolerated. . Lipid-lowering medications: see documented in medical record.  3. At risk for diabetes mellitus Candace [name] was given approximately 15 minutes of diabetes education and counseling today. We discussed intensive lifestyle modifications today with an emphasis on weight loss as well as increasing exercise and decreasing simple carbohydrates in her diet. We also reviewed medication options with an emphasis on risk versus benefit of those discussed.   Repetitive spaced learning was employed today to elicit superior memory formation and behavioral change.  4. Class 2 severe obesity with serious comorbidity and body mass index (BMI) of 39.0 to 39.9 in adult, unspecified obesity type (HCC) Candace [name] is currently in the action stage of change. As such,  her goal is to continue with weight loss efforts. She has agreed to the Category 4 Plan.   Exercise goals: For substantial health benefits, adults should do at least 150 minutes (2 hours and 30 minutes) a week of moderate-intensity, or 75 minutes (1 hour and 15 minutes) a week of vigorous-intensity aerobic physical activity, or an  equivalent combination of moderate- and vigorous-intensity aerobic activity. Aerobic activity should be performed in episodes of at least 10 minutes, and preferably, it should be spread throughout the week.  Behavioral modification strategies: meal planning and cooking strategies and keeping healthy foods in the home.  Candace [name] has agreed to follow-up with our clinic in 2 weeks. She was informed of the importance of frequent follow-up visits to maximize her success with intensive lifestyle modifications for her multiple health conditions.   Objective:   Blood pressure (!) 158/88, pulse 89, temperature 97.9 F (36.6 C), temperature source Oral, height 5\' 7"  (1.702 m), weight 249 lb (112.9 kg), SpO2 94 %. Body mass index is 39 kg/m.  General: Cooperative, alert, well developed, in no acute distress. HEENT: Conjunctivae and lids unremarkable. Cardiovascular: Regular rhythm.  Lungs: Normal work of breathing. Neurologic: No focal deficits.   Lab Results  Component Value Date   CREATININE 0.72 09/08/2019   BUN 11 09/08/2019   NA 141 09/08/2019   K 4.3 09/08/2019   CL 100 09/08/2019   CO2 24 09/08/2019   Lab Results  Component Value Date   ALT 29 09/08/2019   AST 22 09/08/2019   ALKPHOS 101 09/08/2019   BILITOT 0.3 09/08/2019   Lab Results  Component Value Date   HGBA1C 6.0 (H) 09/08/2019   HGBA1C 6.2 (H) 02/04/2019   HGBA1C 5.9 (H) 08/06/2018   HGBA1C 5.9 (H) 02/18/2018   HGBA1C 6.2 (H) 09/17/2017   Lab Results  Component Value Date   INSULIN 12.6 09/08/2019   INSULIN 23.8 02/04/2019   INSULIN 20.6 08/06/2018   INSULIN 15.2 02/18/2018   INSULIN 24.2 09/17/2017   Lab Results  Component Value Date   TSH 2.390 09/17/2017   Lab Results  Component Value Date   CHOL 195 09/08/2019   HDL 55 09/08/2019   LDLCALC 95 09/08/2019   TRIG 271 (H) 09/08/2019   Lab Results  Component Value Date   WBC 7.1 04/15/2019   HGB 13.4 04/15/2019   HCT 41.4 04/15/2019   MCV 83.8  04/15/2019   PLT 297 04/15/2019   Attestation Statements:   Reviewed by clinician on day of visit: allergies, medications, problem list, medical history, surgical history, family history, social history, and previous encounter notes.  I, Water quality scientist, CMA, am acting as Location manager for [employer], PA-C.  I have reviewed the above documentation for accuracy and completeness, and I agree with the above. [name], PA-C

## 2019-10-06 MED FILL — ESOMEPRAZOLE MAG DR 40 MG C: 40 | 90 days supply | Qty: 90 | Fill #3

## 2019-10-15 NOTE — Addendum Note (Signed)
Addended by: Star Age on: 10/15/2019 02:54 PM   Modules accepted: Orders

## 2019-10-15 NOTE — Progress Notes (Signed)
Patient referred by [name], PA, seen by me on 09/15/19, diagnostic PSG on 10/05/19.   Please call and notify the patient that the recent sleep study showed severe obstructive sleep apnea; she also had significant sleep disruption, did not achieve any dream sleep, and had moderate leg movements. I recommend treatment for her OSA with CPAP. This will require a repeat sleep study for proper titration and mask fitting and correct monitoring of the oxygen saturations. Please explain to patient. I have placed an order in the chart. Thanks.  Star Age, MD, PhD [facility])

## 2019-10-15 NOTE — Procedures (Signed)
PATIENT'S NAME:  Candace [name], Candace [name] DOB:      [birth year]      MR#:    [id number]     DATE OF RECORDING: 10/05/2019 REFERRING M.D.:  [name], PA  Study Performed:   Baseline Polysomnogram HISTORY: 59 year old woman with a history of prediabetes, seasonal allergies, restless leg syndrome, reflux disease, hyperlipidemia, depression, anxiety, breast cancer, with status post radiation treatment after lumpectomy, vitamin D deficiency and obesity, who reports snoring, excessive daytime somnolence at times, and a longer standing history of difficulty initiating and maintaining sleep. The patient endorsed the Epworth Sleepiness Scale at 8 points. The patient's weight 249 pounds with a height of 66 (inches), resulting in a BMI of 40. kg/m2. The patient's neck circumference measured 16 inches.  CURRENT MEDICATIONS: Arimidex, Wellbutrin, Zyrtec, Nexium, Prozac, Metformin, Requip, Crestor   PROCEDURE:  This is a multichannel digital polysomnogram utilizing the Somnostar 11.2 system.  Electrodes and sensors were applied and monitored per AASM Specifications.   EEG, EOG, Chin and Limb EMG, were sampled at 200 Hz.  ECG, Snore and Nasal Pressure, Thermal Airflow, Respiratory Effort, CPAP Flow and Pressure, Oximetry was sampled at 50 Hz. Digital video and audio were recorded.      BASELINE STUDY  Lights Out was at 22:34 and Lights On at 04:34.  Total recording time (TRT) was 361 minutes, with a total sleep time (TST) of 165.5 minutes.   The patient's sleep latency to persistent sleep was 52.5 minutes, which is delayed. REM sleep was absent. The sleep efficiency was 45.8%, which is significantly reduced.     SLEEP ARCHITECTURE: WASO (Wake after sleep onset) was 171.5 minutes with moderate to severe sleep fragmentation noted. There were 85 minutes in Stage N1, 41 minutes Stage N2, 39.5 minutes Stage N3 and 0 minutes in Stage REM.  The percentage of Stage N1 was 51.4%, which is highly increased, Stage N2 was 24.8%, Stage N3  was 23.9% and Stage R (REM sleep) was absent. The arousals were noted as: 31 were spontaneous, 112 were associated with PLMs, 137 were associated with respiratory events.  RESPIRATORY ANALYSIS:  There were a total of 148 respiratory events:  0 obstructive apneas, 28 central apneas and 1 mixed apneas with a total of 29 apneas and an apnea index (AI) of 10.5 /hour. There were 119 hypopneas with a hypopnea index of 43.1 /hour. The patient also had 0 respiratory event related arousals (RERAs).      The total APNEA/HYPOPNEA INDEX (AHI) was 53.7/hour and the total RESPIRATORY DISTURBANCE INDEX was  53.7 /hour.  0 events occurred in REM sleep and 267 events in NREM. The REM AHI was n/a, versus a non-REM AHI of 53.7. The patient spent 50.5 minutes of total sleep time in the supine position and 115 minutes in non-supine.. The supine AHI was 110.5 versus a non-supine AHI of 28.7.  OXYGEN SATURATION & C02:  The Wake baseline 02 saturation was 94%, with the lowest being 78%. Time spent below 89% saturation equaled 63 minutes.  PERIODIC LIMB MOVEMENTS: The patient had a total of 133 Periodic Limb Movements.  The Periodic Limb Movement (PLM) index was 48.2 and the PLM Arousal index was 40.6/hour.  Audio and video analysis showed frequent movements during wakefulness and sleep, no unusual phonations or vocalizations. The patient took 1 bathroom break. Moderate to loud snoring was noted. The EKG was in keeping with normal sinus rhythm (NSR).  Post-study, the patient indicated that sleep was worse than usual.   IMPRESSION:  1.  Severe obstructive Sleep Apnea (OSA) 2. Periodic Limb Movement Disorder (PLMD) 3. Dysfunctions associated with sleep stages or arousal from sleep  RECOMMENDATIONS: 1. This study demonstrates severe obstructive sleep apnea, with a total AHI of 53.7/hour, supine AHI of 110.5/hour and O2 nadir of 78%. The absence of REM sleep likely underestimates her sleep disordered breathing. Treatment  with positive airway pressure in the form of CPAP is recommended. This will require a full night titration study to optimize therapy. Other treatment options may be limited given the severity of her OSA, but generally speaking may include avoidance of supine sleep position along with weight loss, upper airway or jaw surgery in selected patients or the use of an oral appliance in certain patients. ENT evaluation and/or consultation with a maxillofacial surgeon or dentist may be feasible in some instances. Please note that untreated obstructive sleep apnea may carry additional perioperative morbidity. Patients with significant obstructive sleep apnea should receive perioperative PAP therapy and the surgeons and particularly the anesthesiologist should be informed of the diagnosis and the severity of the sleep disordered breathing.  2. This study shows significant sleep fragmentation and abnormal sleep stage percentages; these are nonspecific findings and per se do not signify an intrinsic sleep disorder or a cause for the patient's sleep-related symptoms. Causes include (but are not limited to) the first night effect of the sleep study, circadian rhythm disturbances, medication effect or an underlying mood disorder or medical problem.  3. Moderate PLMs (periodic limb movements of sleep) were noted during this study with moderate arousals; clinical correlation is recommended. Medication effect from the antidepressant medication should be considered. PLMs may improve with OSA treatment.  4. The patient should be cautioned not to drive, work at heights, or operate dangerous or heavy equipment when tired or sleepy. Review and reiteration of good sleep hygiene measures should be pursued with any patient. 5. The patient will be seen in follow-up in the sleep clinic at [facility] for discussion of the test results, symptom and treatment compliance review, further management strategies, etc. The referring provider will be  notified of the test results.   I certify that I have reviewed the entire raw data recording prior to the issuance of this report in accordance with the Standards of Accreditation of the American Academy of Sleep Medicine (AASM)   Star Age, MD, PhD Diplomat, American Board of Neurology and Sleep Medicine (Neurology and Sleep Medicine)

## 2019-10-18 ENCOUNTER — Encounter (INDEPENDENT_AMBULATORY_CARE_PROVIDER_SITE_OTHER): Payer: Self-pay | Admitting: Physician Assistant

## 2019-10-18 ENCOUNTER — Telehealth: Payer: Self-pay

## 2019-10-18 NOTE — Telephone Encounter (Signed)
-----   Message from Star Age, MD sent at 10/15/2019  2:54 PM EDT ----- Patient referred by [name], PA, seen by me on 09/15/19, diagnostic PSG on 10/05/19.   Please call and notify the patient that the recent sleep study showed severe obstructive sleep apnea; she also had significant sleep disruption, did not achieve any dream sleep, and had moderate leg movements. I recommend treatment for her OSA with CPAP. This will require a repeat sleep study for proper titration and mask fitting and correct monitoring of the oxygen saturations. Please explain to patient. I have placed an order in the chart. Thanks.  Star Age, MD, PhD [facility])

## 2019-10-18 NOTE — Telephone Encounter (Signed)
I called pt. I advised pt that Dr. Rexene Alberts reviewed their sleep study results and found that pt has severe osa, significant sleep disruption, moderate leg movements and recommends that pt be treated with a cpap. Dr. Rexene Alberts recommends that pt return for a repeat sleep study in order to properly titrate the cpap and ensure a good mask fit. Pt is agreeable to returning for a titration study. I advised pt that our sleep lab will file with pt's insurance and call pt to schedule the sleep study when we hear back from the pt's insurance regarding coverage of this sleep study. Pt verbalized understanding of results. Pt had no questions at this time but was encouraged to call back if questions arise.

## 2019-10-19 ENCOUNTER — Ambulatory Visit (INDEPENDENT_AMBULATORY_CARE_PROVIDER_SITE_OTHER): Payer: 59 | Admitting: Physician Assistant

## 2019-10-19 ENCOUNTER — Other Ambulatory Visit: Payer: Self-pay

## 2019-10-19 ENCOUNTER — Encounter (INDEPENDENT_AMBULATORY_CARE_PROVIDER_SITE_OTHER): Payer: Self-pay | Admitting: Physician Assistant

## 2019-10-19 VITALS — BP 126/94 | HR 100 | Temp 98.5°F | Ht 67.0 in | Wt 249.0 lb

## 2019-10-19 DIAGNOSIS — E559 Vitamin D deficiency, unspecified: Secondary | ICD-10-CM | POA: Diagnosis not present

## 2019-10-19 DIAGNOSIS — Z6839 Body mass index (BMI) 39.0-39.9, adult: Secondary | ICD-10-CM | POA: Diagnosis not present

## 2019-10-19 NOTE — Telephone Encounter (Signed)
Please review

## 2019-10-20 NOTE — Progress Notes (Signed)
Chief Complaint:   OBESITY Candace [name] is here to discuss her progress with her obesity treatment plan along with follow-up of her obesity related diagnoses. Candace [name] is on the Category 4 Plan and states she is following her eating plan approximately 65% of the time. Candace [name] states she is walking the dog 15 minutes 5-7 times per week.  Today's visit was #: 85 Starting weight: 249 lbs Starting date: 09/17/2017 Today's weight: 249 lbs Today's date: 10/19/2019 Total lbs lost to date: 0 Total lbs lost since last in-office visit: 0  Interim History: Candace [name] states that she has not been eating enough protein. She has also been eating Raisin Bran for breakfast and not Special K Protein. .  Subjective:   Vitamin D deficiency. Porfiria is on Vitamin D supplementation. No nausea, vomiting, or muscle weakness. She is walking outside. Last Vitamin D 42.5 on 09/08/2019.  Assessment/Plan:   Vitamin D deficiency. Low Vitamin D level contributes to fatigue and are associated with obesity, breast, and colon cancer. She agrees to continue to take Vitamin D and will follow-up for routine testing of Vitamin D, at least 2-3 times per year to avoid over-replacement.  Class 2 severe obesity with serious comorbidity and body mass index (BMI) of 39.0 to 39.9 in adult, unspecified obesity type ([place]).  Candace [name] is currently in the action stage of change. As such, her goal is to continue with weight loss efforts. She has agreed to the Category 4 Plan.   Exercise goals: For substantial health benefits, adults should do at least 150 minutes (2 hours and 30 minutes) a week of moderate-intensity, or 75 minutes (1 hour and 15 minutes) a week of vigorous-intensity aerobic physical activity, or an equivalent combination of moderate- and vigorous-intensity aerobic activity. Aerobic activity should be performed in episodes of at least 10 minutes, and preferably, it should be spread throughout the week.  Behavioral  modification strategies: meal planning and cooking strategies.  Candace [name] has agreed to follow-up with our clinic in 2 weeks. She was informed of the importance of frequent follow-up visits to maximize her success with intensive lifestyle modifications for her multiple health conditions.   Objective:   Blood pressure (!) 126/94, pulse 100, temperature 98.5 F (36.9 C), temperature source Oral, height 5\' 7"  (1.702 m), weight 249 lb (112.9 kg), SpO2 95 %. Body mass index is 39 kg/m.  General: Cooperative, alert, well developed, in no acute distress. HEENT: Conjunctivae and lids unremarkable. Cardiovascular: Regular rhythm.  Lungs: Normal work of breathing. Neurologic: No focal deficits.   Lab Results  Component Value Date   CREATININE 0.72 09/08/2019   BUN 11 09/08/2019   NA 141 09/08/2019   K 4.3 09/08/2019   CL 100 09/08/2019   CO2 24 09/08/2019   Lab Results  Component Value Date   ALT 29 09/08/2019   AST 22 09/08/2019   ALKPHOS 101 09/08/2019   BILITOT 0.3 09/08/2019   Lab Results  Component Value Date   HGBA1C 6.0 (H) 09/08/2019   HGBA1C 6.2 (H) 02/04/2019   HGBA1C 5.9 (H) 08/06/2018   HGBA1C 5.9 (H) 02/18/2018   HGBA1C 6.2 (H) 09/17/2017   Lab Results  Component Value Date   INSULIN 12.6 09/08/2019   INSULIN 23.8 02/04/2019   INSULIN 20.6 08/06/2018   INSULIN 15.2 02/18/2018   INSULIN 24.2 09/17/2017   Lab Results  Component Value Date   TSH 2.390 09/17/2017   Lab Results  Component Value Date   CHOL 195 09/08/2019  HDL 55 09/08/2019   LDLCALC 95 09/08/2019   TRIG 271 (H) 09/08/2019   Lab Results  Component Value Date   WBC 7.1 04/15/2019   HGB 13.4 04/15/2019   HCT 41.4 04/15/2019   MCV 83.8 04/15/2019   PLT 297 04/15/2019   No results found for: IRON, TIBC, FERRITIN  Attestation Statements:   Reviewed by clinician on day of visit: allergies, medications, problem list, medical history, surgical history, family history, social history, and  previous encounter notes.  Time spent on visit including pre-visit chart review and post-visit charting and care was 32 minutes.   IMichaelene Song, am acting as transcriptionist for [name], PA-C   I have reviewed the above documentation for accuracy and completeness, and I agree with the above. [name], PA-C

## 2019-10-22 ENCOUNTER — Other Ambulatory Visit (HOSPITAL_COMMUNITY): Payer: Self-pay | Admitting: Family Medicine

## 2019-10-22 DIAGNOSIS — E785 Hyperlipidemia, unspecified: Secondary | ICD-10-CM | POA: Diagnosis not present

## 2019-10-22 DIAGNOSIS — I10 Essential (primary) hypertension: Secondary | ICD-10-CM | POA: Diagnosis not present

## 2019-10-22 DIAGNOSIS — F419 Anxiety disorder, unspecified: Secondary | ICD-10-CM | POA: Diagnosis not present

## 2019-10-22 DIAGNOSIS — G2581 Restless legs syndrome: Secondary | ICD-10-CM | POA: Diagnosis not present

## 2019-10-22 DIAGNOSIS — F3341 Major depressive disorder, recurrent, in partial remission: Secondary | ICD-10-CM | POA: Diagnosis not present

## 2019-10-22 DIAGNOSIS — G4733 Obstructive sleep apnea (adult) (pediatric): Secondary | ICD-10-CM | POA: Diagnosis not present

## 2019-10-22 DIAGNOSIS — K219 Gastro-esophageal reflux disease without esophagitis: Secondary | ICD-10-CM | POA: Diagnosis not present

## 2019-10-22 DIAGNOSIS — R7303 Prediabetes: Secondary | ICD-10-CM | POA: Diagnosis not present

## 2019-10-22 MED FILL — rOPINIRole HCL 1 MG TABS: 1 | 90 days supply | Qty: 90 | Fill #0

## 2019-10-22 MED FILL — TRIAMTERENE/HCTZ 37.5/25 TB: 37.5-25 | 90 days supply | Qty: 90 | Fill #0

## 2019-10-28 MED FILL — ANASTROZOLE 1 MG TABLET: 1 | 90 days supply | Qty: 90 | Fill #3

## 2019-11-02 ENCOUNTER — Other Ambulatory Visit (HOSPITAL_COMMUNITY)
Admission: RE | Admit: 2019-11-02 | Discharge: 2019-11-02 | Disposition: A | Discharge status: Home / Self Care | Payer: 59 | Source: Ambulatory Visit | Attending: Neurology | Admitting: Neurology

## 2019-11-02 DIAGNOSIS — Z01812 Encounter for preprocedural laboratory examination: Secondary | ICD-10-CM | POA: Diagnosis not present

## 2019-11-02 DIAGNOSIS — Z20822 Contact with and (suspected) exposure to covid-19: Secondary | ICD-10-CM | POA: Diagnosis not present

## 2019-11-02 LAB — SARS CORONAVIRUS 2 (TAT 6-24 HRS): SARS Coronavirus 2: NEGATIVE

## 2019-11-03 ENCOUNTER — Other Ambulatory Visit: Payer: Self-pay

## 2019-11-03 ENCOUNTER — Encounter (INDEPENDENT_AMBULATORY_CARE_PROVIDER_SITE_OTHER): Payer: Self-pay | Admitting: Physician Assistant

## 2019-11-03 ENCOUNTER — Ambulatory Visit (INDEPENDENT_AMBULATORY_CARE_PROVIDER_SITE_OTHER): Payer: 59 | Admitting: Physician Assistant

## 2019-11-03 VITALS — BP 140/84 | HR 89 | Temp 98.5°F | Ht 67.0 in | Wt 245.0 lb

## 2019-11-03 DIAGNOSIS — E7849 Other hyperlipidemia: Secondary | ICD-10-CM | POA: Diagnosis not present

## 2019-11-03 DIAGNOSIS — Z9189 Other specified personal risk factors, not elsewhere classified: Secondary | ICD-10-CM

## 2019-11-03 DIAGNOSIS — Z6838 Body mass index (BMI) 38.0-38.9, adult: Secondary | ICD-10-CM

## 2019-11-03 DIAGNOSIS — R7303 Prediabetes: Secondary | ICD-10-CM

## 2019-11-03 MED ORDER — METFORMIN HCL 500 MG PO TABS: 500.0000 mg | ORAL_TABLET | Freq: Two times a day (BID) | ORAL | 0 refills | Status: DC

## 2019-11-03 MED FILL — metFORMIN HCL 500 MG TABS: 500 | 30 days supply | Qty: 60 | Fill #0

## 2019-11-03 NOTE — Progress Notes (Signed)
Chief Complaint:   OBESITY Candace [name] is here to discuss her progress with her obesity treatment plan along with follow-up of her obesity related diagnoses. Candace [name] is on the Category 4 Plan and states she is following her eating plan approximately 80% of the time. Candace [name] states she is walking 20 minutes 4 times per week.  Today's visit was #: 32 Starting weight: 249 lbs Starting date: 09/17/2017 Today's weight: 245 lbs Today's date: 11/03/2019 Total lbs lost to date: 4 Total lbs lost since last in-office visit: 4  Interim History: Chynah reports that she made sure her lunch was full of good snacks. She has done a better job of eating on plan.  Subjective:   Prediabetes. Candace [name] has a diagnosis of prediabetes based on her elevated HgA1c and was informed this puts her at greater risk of developing diabetes. She continues to work on diet and exercise to decrease her risk of diabetes. She denies nausea, vomiting, or diarrhea. No polyphagia. She is on metformin. She is exercising regularly.  Lab Results  Component Value Date   HGBA1C 6.0 (H) 09/08/2019   Lab Results  Component Value Date   INSULIN 12.6 09/08/2019   INSULIN 23.8 02/04/2019   INSULIN 20.6 08/06/2018   INSULIN 15.2 02/18/2018   INSULIN 24.2 09/17/2017   Other hyperlipidemia. Candace [name] is on Crestor. No chest pain or myalgias. She is exercising regularly.   Lab Results  Component Value Date   CHOL 195 09/08/2019   HDL 55 09/08/2019   LDLCALC 95 09/08/2019   TRIG 271 (H) 09/08/2019   Lab Results  Component Value Date   ALT 29 09/08/2019   AST 22 09/08/2019   ALKPHOS 101 09/08/2019   BILITOT 0.3 09/08/2019   The 10-year ASCVD risk score Candace Bussing DC Jr., et al., 2013) is: 3.1%   Values used to calculate the score:     Age: 19 years     Sex: Female     Is Non-Hispanic African American: No     Diabetic: No     Tobacco smoker: No     Systolic Blood Pressure: [id number] mmHg     Is BP treated: No     HDL  Cholesterol: 55 mg/dL     Total Cholesterol: 195 mg/dL  At risk for heart disease. Candace [name] is at a higher than average risk for cardiovascular disease due to obesity.   Assessment/Plan:   Prediabetes. Lux will continue to work on weight loss, exercise, and decreasing simple carbohydrates to help decrease the risk of diabetes. Refill was given for metFORMIN (GLUCOPHAGE) 500 MG tablet #60 with 0 refills.  Other hyperlipidemia. Cardiovascular risk and specific lipid/LDL goals reviewed.  We discussed several lifestyle modifications today and Candace [name] will continue to work on diet, exercise and weight loss efforts. Orders and follow up as documented in patient record. She will continue her medication as directed.  Counseling Intensive lifestyle modifications are the first line treatment for this issue. . Dietary changes: Increase soluble fiber. Decrease simple carbohydrates. . Exercise changes: Moderate to vigorous-intensity aerobic activity 150 minutes per week if tolerated. . Lipid-lowering medications: see documented in medical record.  At risk for heart disease. Candace [name] was given approximately 15 minutes of coronary artery disease prevention counseling today. She is 59 y.o. female and has risk factors for heart disease including obesity. We discussed intensive lifestyle modifications today with an emphasis on specific weight loss instructions and strategies.   Repetitive spaced learning was employed today to elicit superior  memory formation and behavioral change.  Class 2 severe obesity with serious comorbidity and body mass index (BMI) of 38.0 to 38.9 in adult, unspecified obesity type (Candace [name]).  Candace [name] is currently in the action stage of change. As such, her goal is to continue with weight loss efforts. She has agreed to the Category 4 Plan.   Exercise goals: For substantial health benefits, adults should do at least 150 minutes (2 hours and 30 minutes) a week of moderate-intensity, or 75  minutes (1 hour and 15 minutes) a week of vigorous-intensity aerobic physical activity, or an equivalent combination of moderate- and vigorous-intensity aerobic activity. Aerobic activity should be performed in episodes of at least 10 minutes, and preferably, it should be spread throughout the week.  Behavioral modification strategies: meal planning and cooking strategies and keeping healthy foods in the home.  Candace [name] has agreed to follow-up with our clinic in 2 weeks. She was informed of the importance of frequent follow-up visits to maximize her success with intensive lifestyle modifications for her multiple health conditions.   Objective:   Blood pressure 140/84, pulse 89, temperature 98.5 F (36.9 C), temperature source Oral, height 5\' 7"  (1.702 m), weight 245 lb (111.1 kg), SpO2 97 %. Body mass index is 38.37 kg/m.  General: Cooperative, alert, well developed, in no acute distress. HEENT: Conjunctivae and lids unremarkable. Cardiovascular: Regular rhythm.  Lungs: Normal work of breathing. Neurologic: No focal deficits.   Lab Results  Component Value Date   CREATININE 0.72 09/08/2019   BUN 11 09/08/2019   NA 141 09/08/2019   K 4.3 09/08/2019   CL 100 09/08/2019   CO2 24 09/08/2019   Lab Results  Component Value Date   ALT 29 09/08/2019   AST 22 09/08/2019   ALKPHOS 101 09/08/2019   BILITOT 0.3 09/08/2019   Lab Results  Component Value Date   HGBA1C 6.0 (H) 09/08/2019   HGBA1C 6.2 (H) 02/04/2019   HGBA1C 5.9 (H) 08/06/2018   HGBA1C 5.9 (H) 02/18/2018   HGBA1C 6.2 (H) 09/17/2017   Lab Results  Component Value Date   INSULIN 12.6 09/08/2019   INSULIN 23.8 02/04/2019   INSULIN 20.6 08/06/2018   INSULIN 15.2 02/18/2018   INSULIN 24.2 09/17/2017   Lab Results  Component Value Date   TSH 2.390 09/17/2017   Lab Results  Component Value Date   CHOL 195 09/08/2019   HDL 55 09/08/2019   LDLCALC 95 09/08/2019   TRIG 271 (H) 09/08/2019   Lab Results  Component  Value Date   WBC 7.1 04/15/2019   HGB 13.4 04/15/2019   HCT 41.4 04/15/2019   MCV 83.8 04/15/2019   PLT 297 04/15/2019   No results found for: IRON, TIBC, FERRITIN  Attestation Statements:   Reviewed by clinician on day of visit: allergies, medications, problem list, medical history, surgical history, family history, social history, and previous encounter notes.  IMichaelene Song, am acting as transcriptionist for [name], PA-C   I have reviewed the above documentation for accuracy and completeness, and I agree with the above. [name], PA-C

## 2019-11-04 ENCOUNTER — Ambulatory Visit (INDEPENDENT_AMBULATORY_CARE_PROVIDER_SITE_OTHER): Payer: 59 | Admitting: Neurology

## 2019-11-04 DIAGNOSIS — G4720 Circadian rhythm sleep disorder, unspecified type: Secondary | ICD-10-CM

## 2019-11-04 DIAGNOSIS — G4700 Insomnia, unspecified: Secondary | ICD-10-CM

## 2019-11-04 DIAGNOSIS — E669 Obesity, unspecified: Secondary | ICD-10-CM

## 2019-11-04 DIAGNOSIS — G2581 Restless legs syndrome: Secondary | ICD-10-CM

## 2019-11-04 DIAGNOSIS — G479 Sleep disorder, unspecified: Secondary | ICD-10-CM

## 2019-11-04 DIAGNOSIS — G4761 Periodic limb movement disorder: Secondary | ICD-10-CM

## 2019-11-04 DIAGNOSIS — G4733 Obstructive sleep apnea (adult) (pediatric): Secondary | ICD-10-CM | POA: Diagnosis not present

## 2019-11-04 DIAGNOSIS — R351 Nocturia: Secondary | ICD-10-CM

## 2019-11-04 DIAGNOSIS — R519 Headache, unspecified: Secondary | ICD-10-CM

## 2019-11-17 ENCOUNTER — Other Ambulatory Visit (INDEPENDENT_AMBULATORY_CARE_PROVIDER_SITE_OTHER): Payer: Self-pay | Admitting: Physician Assistant

## 2019-11-17 ENCOUNTER — Encounter (INDEPENDENT_AMBULATORY_CARE_PROVIDER_SITE_OTHER): Payer: Self-pay | Admitting: Physician Assistant

## 2019-11-17 ENCOUNTER — Ambulatory Visit (INDEPENDENT_AMBULATORY_CARE_PROVIDER_SITE_OTHER): Payer: 59 | Admitting: Physician Assistant

## 2019-11-17 ENCOUNTER — Other Ambulatory Visit: Payer: Self-pay

## 2019-11-17 VITALS — BP 131/83 | HR 80 | Temp 98.1°F | Ht 67.0 in | Wt 244.0 lb

## 2019-11-17 DIAGNOSIS — Z6838 Body mass index (BMI) 38.0-38.9, adult: Secondary | ICD-10-CM

## 2019-11-17 DIAGNOSIS — Z9189 Other specified personal risk factors, not elsewhere classified: Secondary | ICD-10-CM

## 2019-11-17 DIAGNOSIS — E7849 Other hyperlipidemia: Secondary | ICD-10-CM | POA: Diagnosis not present

## 2019-11-17 DIAGNOSIS — R7303 Prediabetes: Secondary | ICD-10-CM | POA: Diagnosis not present

## 2019-11-17 MED ORDER — METFORMIN HCL 500 MG PO TABS: 500.0000 mg | ORAL_TABLET | Freq: Two times a day (BID) | ORAL | 0 refills | Status: DC

## 2019-11-17 NOTE — Addendum Note (Signed)
Addended by: Star Age on: 11/17/2019 07:59 AM   Modules accepted: Orders

## 2019-11-17 NOTE — Procedures (Signed)
PATIENT'S NAME:  Candace [name], Etris DOB:      10/26/1960      MR#:    [id number]     DATE OF RECORDING: 11/04/2019 REFERRING M.D.:  [name], PA  Study Performed:   CPAP  Titration HISTORY: 59 year old woman with a history of prediabetes, seasonal allergies, restless leg syndrome, reflux disease, hyperlipidemia, depression, anxiety, breast cancer, with status post radiation treatment after lumpectomy, vitamin D deficiency and obesity, who presents for a full night titration study to treat her severe OSA. Her baseline sleep study from 10/05/19 showed a total AHI of 53.7/hour, supine AHI of 110.5/hour and O2 nadir of 78%. The absence of REM sleep likely underestimated her sleep disordered breathing. The patient endorsed the Epworth Sleepiness Scale at 8 points. The patient's weight 249 pounds with a height of 66 (inches), resulting in a BMI of 40. kg/m2. The patient's neck circumference measured 16 inches.  CURRENT MEDICATIONS: Arimidex, Wellbutrin, Zyrtec, Nexium, Prozac, Metformin, Requip, Crestor  PROCEDURE:  This is a multichannel digital polysomnogram utilizing the SomnoStar 11.2 system.  Electrodes and sensors were applied and monitored per AASM Specifications.   EEG, EOG, Chin and Limb EMG, were sampled at 200 Hz.  ECG, Snore and Nasal Pressure, Thermal Airflow, Respiratory Effort, CPAP Flow and Pressure, Oximetry was sampled at 50 Hz. Digital video and audio were recorded.      The patient was fitted with a small Simplus FFM. CPAP was initiated at 5 cmH20 with heated humidity per AASM split night standards and pressure was advanced to 12 cmH20 because of hypopneas, apneas and desaturations.  At a PAP pressure of 12 cmH20, there was a reduction of the AHI to 0/hour with supine NREM sleep achieved and O2 nadir of 89%.   Lights Out was at 22:34 and Lights On at 04:35. Total recording time (TRT) was 362 minutes, with a total sleep time (TST) of 228.5 minutes. The patient's sleep latency was 10.5 minutes.  REM latency was 83 minutes, which is normal. The sleep efficiency was 63.1%.    SLEEP ARCHITECTURE: WASO (Wake after sleep onset) was 40.5 minutes with mild to moderate sleep fragmentation noted. There were 11 minutes in Stage N1, 125 minutes Stage N2, 50 minutes Stage N3 and 42.5 minutes in Stage REM.  The percentage of Stage N1 was 4.8%, Stage N2 was 54.7%, which is normal, Stage N3 was 21.9% and Stage R (REM sleep) was 18.6%, which is near-normal. The arousals were noted as: 51 were spontaneous, 2 were associated with PLMs, 27 were associated with respiratory events.  RESPIRATORY ANALYSIS:  There was a total of 49 respiratory events: 14 obstructive apneas, 0 central apneas and 1 mixed apneas with a total of 15 apneas and an apnea index (AI) of 3.9 /hour. There were 34 hypopneas with a hypopnea index of 8.9/hour. The patient also had 0 respiratory event related arousals (RERAs).      The total APNEA/HYPOPNEA INDEX  (AHI) was 12.9 /hour and the total RESPIRATORY DISTURBANCE INDEX was 12.9 /hour  0 events occurred in REM sleep and 49 events in NREM. The REM AHI was 0 /hour versus a non-REM AHI of 15.8 /hour.  The patient spent 165.5 minutes of total sleep time in the supine position and 63 minutes in non-supine. The supine AHI was 17.7, versus a non-supine AHI of 0.0.  OXYGEN SATURATION & C02:  The baseline 02 saturation was 94%, with the lowest being 79%. Time spent below 89% saturation equaled 15 minutes.  PERIODIC LIMB MOVEMENTS:  The patient had a total of 6 Periodic Limb Movements. The Periodic Limb Movement (PLM) index was 1.6 and the PLM Arousal index was .5 /hour.  Audio and video analysis did not show any abnormal or unusual movements, behaviors, phonations or vocalizations. The patient took 1 bathroom break. The EKG was in keeping with normal sinus rhythm (NSR).  Post-study, the patient indicated that sleep was better than usual.   IMPRESSION:   1. Severe obstructive Sleep Apnea  (OSA) 2. Dysfunctions associated with sleep stages or arousal from sleep   RECOMMENDATIONS: 1. This study demonstrates resolution of the patient's obstructive sleep apnea with CPAP therapy. I will, therefore, start the patient on home CPAP treatment at a pressure of 12 cm via small FFM. The patient should be reminded to be fully compliant with PAP therapy to improve sleep related symptoms and decrease long term cardiovascular risks. The patient should be reminded, that it may take up to 3 months to get fully used to using PAP with all planned sleep. The earlier full compliance is achieved, the better long term compliance tends to be. Please note that untreated obstructive sleep apnea may carry additional perioperative morbidity. Patients with significant obstructive sleep apnea should receive perioperative PAP therapy and the surgeons and particularly the anesthesiologist should be informed of the diagnosis and the severity of the sleep disordered breathing. 2. This study shows sleep fragmentation but much improved sleep architecture compared to the baseline sleep study, and no significant periodic leg movements.    3. The patient should be cautioned not to drive, work at heights, or operate dangerous or heavy equipment when tired or sleepy. Review and reiteration of good sleep hygiene measures should be pursued with any patient. 4. The patient will be seen in follow-up in the sleep clinic at [facility] for discussion of the test results, symptom and treatment compliance review, further management strategies, etc. The referring provider will be notified of the test results.   I certify that I have reviewed the entire raw data recording prior to the issuance of this report in accordance with the Standards of Accreditation of the American Academy of Sleep Medicine (AASM)   Star Age, MD, PhD Diplomat, American Board of Neurology and Sleep Medicine (Neurology and Sleep Medicine)

## 2019-11-17 NOTE — Progress Notes (Signed)
Patient referred by [name], PA, seen by me on 09/15/19, diagnostic PSG on 10/05/19. Patient had a CPAP titration study on 11/04/19.  Please call and inform patient that I have entered an order for treatment with positive airway pressure (PAP) treatment for obstructive sleep apnea (OSA). She did well during the latest sleep study with CPAP. We will, therefore, arrange for a machine for home use through a DME (durable medical equipment) company of Her choice; and I will see the patient back in follow-up in about 10 weeks. Please also explain to the patient that I will be looking out for compliance data, which can be downloaded from the machine (stored on an SD card, that is inserted in the machine) or via remote access through a modem, that is built into the machine. At the time of the followup appointment we will discuss sleep study results and how it is going with PAP treatment at home. Please advise patient to bring Her machine at the time of the first FU visit, even though this is cumbersome. Bringing the machine for every visit after that will likely not be needed, but often helps for the first visit to troubleshoot if needed. Please re-enforce the importance of compliance with treatment and the need for [name] to monitor compliance data - often an insurance requirement and actually good feedback for the patient as far as how they are doing.  Also remind patient, that any interim PAP machine or mask issues should be first addressed with the DME company, as they can often help better with technical and mask fit issues. Please ask if patient has a preference regarding DME company.  Please also make sure, the patient has a follow-up appointment with me in about 10 weeks from the setup date, thanks. May see one of our nurse practitioners if needed for proper timing of the FU appointment.  Please fax or rout report to the referring provider. Thanks,   Star Age, MD, PhD [facility])

## 2019-11-18 ENCOUNTER — Telehealth: Payer: Self-pay

## 2019-11-18 ENCOUNTER — Encounter (INDEPENDENT_AMBULATORY_CARE_PROVIDER_SITE_OTHER): Payer: Self-pay | Admitting: Physician Assistant

## 2019-11-18 NOTE — Telephone Encounter (Signed)
-----   Message from Star Age, MD sent at 11/17/2019  7:59 AM EDT ----- Patient referred by [name], PA, seen by me on 09/15/19, diagnostic PSG on 10/05/19. Patient had a CPAP titration study on 11/04/19.  Please call and inform patient that I have entered an order for treatment with positive airway pressure (PAP) treatment for obstructive sleep apnea (OSA). She did well during the latest sleep study with CPAP. We will, therefore, arrange for a machine for home use through a DME (durable medical equipment) company of Her choice; and I will see the patient back in follow-up in about 10 weeks. Please also explain to the patient that I will be looking out for compliance data, which can be downloaded from the machine (stored on an SD card, that is inserted in the machine) or via remote access through a modem, that is built into the machine. At the time of the followup appointment we will discuss sleep study results and how it is going with PAP treatment at home. Please advise patient to bring Her machine at the time of the first FU visit, even though this is cumbersome. Bringing the machine for every visit after that will likely not be needed, but often helps for the first visit to troubleshoot if needed. Please re-enforce the importance of compliance with treatment and the need for [name] to monitor compliance data - often an insurance requirement and actually good feedback for the patient as far as how they are doing.  Also remind patient, that any interim PAP machine or mask issues should be first addressed with the DME company, as they can often help better with technical and mask fit issues. Please ask if patient has a preference regarding DME company.  Please also make sure, the patient has a follow-up appointment with me in about 10 weeks from the setup date, thanks. May see one of our nurse practitioners if needed for proper timing of the FU appointment.  Please fax or rout report to the referring provider.  Thanks,   Star Age, MD, PhD [facility])

## 2019-11-18 NOTE — Progress Notes (Signed)
Chief Complaint:   OBESITY Candace [name] is here to discuss her progress with her obesity treatment plan along with follow-up of her obesity related diagnoses. Candace [name] is on the Category 4 Plan and states she is following her eating plan approximately 70% of the time. Candace [name] states she is walking for 20 minutes 4 times per week.  Today's visit was #: 94 Starting weight: 249 lbs Starting date: 09/17/2017 Today's weight: 244 lbs Today's date: 11/17/2019 Total lbs lost to date: 5 Total lbs lost since last in-office visit: 1  Interim History: Candace [name] states that she did better with dinners and better at work. She is not eating enough protein the last few weeks, as she reports a decrease in her appetite. She does not yet has her CPAP.  Subjective:   1. Other hyperlipidemia Candace [name] is on Crestor, and she denies chest pain or myalgias.  2. Pre-diabetes Candace [name] is on metformin, and she denies nausea, vomiting, diarrhea, or polyphagia.  3. At risk for diabetes mellitus Candace [name] is at higher than average risk for developing diabetes due to her obesity.   Assessment/Plan:   1. Other hyperlipidemia Cardiovascular risk and specific lipid/LDL goals reviewed. We discussed several lifestyle modifications today and Candace [name] will continue her medications, and will continue to work on diet, exercise and weight loss efforts. Orders and follow up as documented in patient record.   Counseling Intensive lifestyle modifications are the first line treatment for this issue. . Dietary changes: Increase soluble fiber. Decrease simple carbohydrates. . Exercise changes: Moderate to vigorous-intensity aerobic activity 150 minutes per week if tolerated. . Lipid-lowering medications: see documented in medical record.  2. Pre-diabetes Candace [name] will continue to work on weight loss, exercise, and decreasing simple carbohydrates to help decrease the risk of diabetes. We will refill metformin for 1 month.  - metFORMIN  (GLUCOPHAGE) 500 MG tablet; Take 1 tablet (500 mg total) by mouth 2 (two) times daily with a meal.  Dispense: 60 tablet; Refill: 0  3. At risk for diabetes mellitus Candace [name] was given approximately 15 minutes of diabetes education and counseling today. We discussed intensive lifestyle modifications today with an emphasis on weight loss as well as increasing exercise and decreasing simple carbohydrates in her diet. We also reviewed medication options with an emphasis on risk versus benefit of those discussed.   Repetitive spaced learning was employed today to elicit superior memory formation and behavioral change.  4. Class 2 severe obesity with serious comorbidity and body mass index (BMI) of 38.0 to 38.9 in adult, unspecified obesity type (HCC) Candace [name] is currently in the action stage of change. As such, her goal is to continue with weight loss efforts. She has agreed to the Category 4 Plan.   Exercise goals: As is.  Behavioral modification strategies: increasing lean protein intake and planning for success.  Candace [name] has agreed to follow-up with our clinic in 2 to 3 weeks. She was informed of the importance of frequent follow-up visits to maximize her success with intensive lifestyle modifications for her multiple health conditions.   Objective:   Blood pressure 131/83, pulse 80, temperature 98.1 F (36.7 C), temperature source Oral, height 5\' 7"  (1.702 m), weight 244 lb (110.7 kg), SpO2 97 %. Body mass index is 38.22 kg/m.  General: Cooperative, alert, well developed, in no acute distress. HEENT: Conjunctivae and lids unremarkable. Cardiovascular: Regular rhythm.  Lungs: Normal work of breathing. Neurologic: No focal deficits.   Lab Results  Component Value Date   CREATININE 0.72 09/08/2019  BUN 11 09/08/2019   NA 141 09/08/2019   K 4.3 09/08/2019   CL 100 09/08/2019   CO2 24 09/08/2019   Lab Results  Component Value Date   ALT 29 09/08/2019   AST 22 09/08/2019   ALKPHOS 101  09/08/2019   BILITOT 0.3 09/08/2019   Lab Results  Component Value Date   HGBA1C 6.0 (H) 09/08/2019   HGBA1C 6.2 (H) 02/04/2019   HGBA1C 5.9 (H) 08/06/2018   HGBA1C 5.9 (H) 02/18/2018   HGBA1C 6.2 (H) 09/17/2017   Lab Results  Component Value Date   INSULIN 12.6 09/08/2019   INSULIN 23.8 02/04/2019   INSULIN 20.6 08/06/2018   INSULIN 15.2 02/18/2018   INSULIN 24.2 09/17/2017   Lab Results  Component Value Date   TSH 2.390 09/17/2017   Lab Results  Component Value Date   CHOL 195 09/08/2019   HDL 55 09/08/2019   LDLCALC 95 09/08/2019   TRIG 271 (H) 09/08/2019   Lab Results  Component Value Date   WBC 7.1 04/15/2019   HGB 13.4 04/15/2019   HCT 41.4 04/15/2019   MCV 83.8 04/15/2019   PLT 297 04/15/2019   No results found for: IRON, TIBC, FERRITIN  Attestation Statements:   Reviewed by clinician on day of visit: allergies, medications, problem list, medical history, surgical history, family history, social history, and previous encounter notes.   Wilhemena Durie, am acting as transcriptionist for [employer], PA-C.  I have reviewed the above documentation for accuracy and completeness, and I agree with the above. [name], PA-C

## 2019-11-18 NOTE — Telephone Encounter (Signed)
I called pt. No answer, left a message asking pt to call me back.   

## 2019-11-23 DIAGNOSIS — Z01419 Encounter for gynecological examination (general) (routine) without abnormal findings: Secondary | ICD-10-CM | POA: Diagnosis not present

## 2019-11-23 DIAGNOSIS — Z124 Encounter for screening for malignant neoplasm of cervix: Secondary | ICD-10-CM | POA: Diagnosis not present

## 2019-11-23 NOTE — Telephone Encounter (Signed)
Late entry 11/18/2019.  Pt returned my call I called pt. I advised pt that Dr. Rexene Alberts reviewed their sleep study results and found that pt did well during her cpap titration with a cpap. Dr. Rexene Alberts recommends that pt start a cpap at home for treatment. I reviewed PAP compliance expectations with the pt. Pt is agreeable to starting a CPAP. I advised pt that an order will be sent to a DME, [facility], and [facility] will call the pt within about one week after they file with the pt's insurance. [facility] will show the pt how to use the machine, fit for masks, and troubleshoot the CPAP if needed. A follow up appt was made for insurance purposes with Dr. Rexene Alberts on 01/26/2020. Pt verbalized understanding to arrive 15 minutes early and bring their CPAP. A letter with all of this information in it will be mailed to the pt as a reminder. I verified with the pt that the address we have on file is correct. Pt verbalized understanding of results. Pt had no questions at this time but was encouraged to call back if questions arise. I have sent the order to [facility] and have received confirmation that they have received the order.

## 2019-12-09 ENCOUNTER — Other Ambulatory Visit: Payer: Self-pay

## 2019-12-09 ENCOUNTER — Ambulatory Visit (INDEPENDENT_AMBULATORY_CARE_PROVIDER_SITE_OTHER): Payer: 59 | Admitting: Physician Assistant

## 2019-12-09 ENCOUNTER — Encounter (INDEPENDENT_AMBULATORY_CARE_PROVIDER_SITE_OTHER): Payer: Self-pay | Admitting: Physician Assistant

## 2019-12-09 VITALS — BP 142/85 | HR 93 | Temp 98.5°F | Ht 67.0 in | Wt 242.0 lb

## 2019-12-09 DIAGNOSIS — R7303 Prediabetes: Secondary | ICD-10-CM | POA: Diagnosis not present

## 2019-12-09 DIAGNOSIS — Z9189 Other specified personal risk factors, not elsewhere classified: Secondary | ICD-10-CM

## 2019-12-09 DIAGNOSIS — G4733 Obstructive sleep apnea (adult) (pediatric): Secondary | ICD-10-CM | POA: Diagnosis not present

## 2019-12-09 DIAGNOSIS — E7849 Other hyperlipidemia: Secondary | ICD-10-CM

## 2019-12-09 DIAGNOSIS — Z6837 Body mass index (BMI) 37.0-37.9, adult: Secondary | ICD-10-CM | POA: Diagnosis not present

## 2019-12-09 MED ORDER — METFORMIN HCL 500 MG PO TABS: 500.0000 mg | ORAL_TABLET | Freq: Two times a day (BID) | ORAL | 0 refills | Status: DC

## 2019-12-09 NOTE — Progress Notes (Signed)
Chief Complaint:   OBESITY Candace [name] is here to discuss her progress with her obesity treatment plan along with follow-up of her obesity related diagnoses. Candace [name] is on the Category 4 Plan and states she is following her eating plan approximately 75% of the time. Candace [name] states she is exercising 0 minutes 0 times per week.  Today's visit was #: 73 Starting weight: 249 lbs Starting date: 09/17/2017 Today's weight: 242 lbs Today's date: 12/09/2019 Total lbs lost to date: 7 Total lbs lost since last in-office visit: 2  Interim History: Candace [name] states that she had her daughter's graduation and did not fully eat on plan. Overall she feels like it was a "good 3 weeks."  Subjective:   Prediabetes. Candace [name] has a diagnosis of prediabetes based on her elevated HgA1c and was informed this puts her at greater risk of developing diabetes. She continues to work on diet and exercise to decrease her risk of diabetes. No nausea, vomiting, diarrhea, or polyphagia. Candace [name] is on metformin.   Lab Results  Component Value Date   HGBA1C 6.0 (H) 09/08/2019   Lab Results  Component Value Date   INSULIN 12.6 09/08/2019   INSULIN 23.8 02/04/2019   INSULIN 20.6 08/06/2018   INSULIN 15.2 02/18/2018   INSULIN 24.2 09/17/2017   Other hyperlipidemia. Candace [name] is on Crestor. No chest pain or myalgias.   Lab Results  Component Value Date   CHOL 195 09/08/2019   HDL 55 09/08/2019   LDLCALC 95 09/08/2019   TRIG 271 (H) 09/08/2019   Lab Results  Component Value Date   ALT 29 09/08/2019   AST 22 09/08/2019   ALKPHOS 101 09/08/2019   BILITOT 0.3 09/08/2019   The 10-year ASCVD risk score Candace [name] DC Jr., et al., 2013) is: 4.3%   Values used to calculate the score:     Age: 59 years     Sex: Female     Is Non-Hispanic African American: No     Diabetic: No     Tobacco smoker: No     Systolic Blood Pressure: 027 mmHg     Is BP treated: Yes     HDL Cholesterol: 55 mg/dL     Total Cholesterol: 195  mg/dL  At risk for heart disease. Candace [name] is at a higher than average risk for cardiovascular disease due to obesity.   Assessment/Plan:   Prediabetes. Candace [name] will continue to work on weight loss, exercise, and decreasing simple carbohydrates to help decrease the risk of diabetes. Refill was given for metFORMIN (GLUCOPHAGE) 500 MG tablet #60 with 0 refills.  Other hyperlipidemia. Cardiovascular risk and specific lipid/LDL goals reviewed.  We discussed several lifestyle modifications today and Shantice will continue to work on diet, exercise and weight loss efforts. Orders and follow up as documented in patient record. She will continue her medication as directed.  Counseling Intensive lifestyle modifications are the first line treatment for this issue.  Dietary changes: Increase soluble fiber. Decrease simple carbohydrates.  Exercise changes: Moderate to vigorous-intensity aerobic activity 150 minutes per week if tolerated.  Lipid-lowering medications: see documented in medical record.  At risk for heart disease. Candace [name] was given approximately 15 minutes of coronary artery disease prevention counseling today. She is 59 y.o. female and has risk factors for heart disease including obesity. We discussed intensive lifestyle modifications today with an emphasis on specific weight loss instructions and strategies.   Repetitive spaced learning was employed today to elicit superior memory formation and behavioral change.  Class  2 severe obesity with serious comorbidity and body mass index (BMI) of 37.0 to 37.9 in adult, unspecified obesity type (Candace [name]).  Candace [name] is currently in the action stage of change. As such, her goal is to continue with weight loss efforts. She has agreed to the Category 4 Plan.   Exercise goals: For substantial health benefits, adults should do at least 150 minutes (2 hours and 30 minutes) a week of moderate-intensity, or 75 minutes (1 hour and 15 minutes) a week of  vigorous-intensity aerobic physical activity, or an equivalent combination of moderate- and vigorous-intensity aerobic activity. Aerobic activity should be performed in episodes of at least 10 minutes, and preferably, it should be spread throughout the week.  Behavioral modification strategies: avoiding temptations and planning for success.  Candace [name] has agreed to follow-up with our clinic in 2-3 weeks. She was informed of the importance of frequent follow-up visits to maximize her success with intensive lifestyle modifications for her multiple health conditions.   Objective:   Blood pressure (!) 142/85, pulse 93, temperature 98.5 F (36.9 C), temperature source Oral, height 5\' 7"  (1.702 m), weight 242 lb (109.8 kg), SpO2 100 %. Body mass index is 37.9 kg/m.  General: Cooperative, alert, well developed, in no acute distress. HEENT: Conjunctivae and lids unremarkable. Cardiovascular: Regular rhythm.  Lungs: Normal work of breathing. Neurologic: No focal deficits.   Lab Results  Component Value Date   CREATININE 0.72 09/08/2019   BUN 11 09/08/2019   NA 141 09/08/2019   K 4.3 09/08/2019   CL 100 09/08/2019   CO2 24 09/08/2019   Lab Results  Component Value Date   ALT 29 09/08/2019   AST 22 09/08/2019   ALKPHOS 101 09/08/2019   BILITOT 0.3 09/08/2019   Lab Results  Component Value Date   HGBA1C 6.0 (H) 09/08/2019   HGBA1C 6.2 (H) 02/04/2019   HGBA1C 5.9 (H) 08/06/2018   HGBA1C 5.9 (H) 02/18/2018   HGBA1C 6.2 (H) 09/17/2017   Lab Results  Component Value Date   INSULIN 12.6 09/08/2019   INSULIN 23.8 02/04/2019   INSULIN 20.6 08/06/2018   INSULIN 15.2 02/18/2018   INSULIN 24.2 09/17/2017   Lab Results  Component Value Date   TSH 2.390 09/17/2017   Lab Results  Component Value Date   CHOL 195 09/08/2019   HDL 55 09/08/2019   LDLCALC 95 09/08/2019   TRIG 271 (H) 09/08/2019   Lab Results  Component Value Date   WBC 7.1 04/15/2019   HGB 13.4 04/15/2019   HCT 41.4  04/15/2019   MCV 83.8 04/15/2019   PLT 297 04/15/2019   No results found for: IRON, TIBC, FERRITIN  Attestation Statements:   Reviewed by clinician on day of visit: allergies, medications, problem list, medical history, surgical history, family history, social history, and previous encounter notes.  IMichaelene Song, am acting as transcriptionist for [name], PA-C   I have reviewed the above documentation for accuracy and completeness, and I agree with the above. [name], PA-C

## 2019-12-27 MED FILL — BUPROPION HCL ER (XL) 300 M: 300 | 90 days supply | Qty: 90 | Fill #0

## 2019-12-27 MED FILL — FLUoxetine HCL 10 MG TABS: 10 | 90 days supply | Qty: 90 | Fill #0

## 2020-01-06 ENCOUNTER — Ambulatory Visit (INDEPENDENT_AMBULATORY_CARE_PROVIDER_SITE_OTHER): Payer: 59 | Admitting: Physician Assistant

## 2020-01-06 ENCOUNTER — Encounter (INDEPENDENT_AMBULATORY_CARE_PROVIDER_SITE_OTHER): Payer: Self-pay | Admitting: Physician Assistant

## 2020-01-06 ENCOUNTER — Other Ambulatory Visit: Payer: Self-pay

## 2020-01-06 VITALS — BP 137/74 | HR 88 | Temp 97.9°F | Ht 67.0 in | Wt 245.0 lb

## 2020-01-06 DIAGNOSIS — G4733 Obstructive sleep apnea (adult) (pediatric): Secondary | ICD-10-CM

## 2020-01-06 DIAGNOSIS — Z6838 Body mass index (BMI) 38.0-38.9, adult: Secondary | ICD-10-CM

## 2020-01-06 DIAGNOSIS — Z9189 Other specified personal risk factors, not elsewhere classified: Secondary | ICD-10-CM | POA: Diagnosis not present

## 2020-01-06 DIAGNOSIS — R7303 Prediabetes: Secondary | ICD-10-CM

## 2020-01-06 MED ORDER — VICTOZA 18 MG/3ML ~~LOC~~ SOPN: 1.8000 mg | PEN_INJECTOR | SUBCUTANEOUS | 0 refills | Status: DC

## 2020-01-06 MED ORDER — METFORMIN HCL 500 MG PO TABS: 500.0000 mg | ORAL_TABLET | Freq: Two times a day (BID) | ORAL | 0 refills | Status: DC

## 2020-01-06 MED FILL — METFORMIN HCL 500 MG TABS: 500 | 30 days supply | Qty: 60 | Fill #0

## 2020-01-06 NOTE — Progress Notes (Signed)
Chief Complaint:   OBESITY Candace [name] is here to discuss her progress with her obesity treatment plan along with follow-up of her obesity related diagnoses. Candace [name] is on the Category 4 Plan and states she is following her eating plan approximately 25% of the time. Candace [name] states she is walking 20 minutes 4 times per week.  Today's visit was #: 65 Starting weight: 249 lbs Starting date: 09/17/2017 Today's weight: 245 lbs Today's date: 01/06/2020 Total lbs lost to date: 4 Total lbs lost since last in-office visit: 0  Interim History: Candace [name] states that she has been very stressed and has been emotionally eating the last 2 weeks. She is traveling to [place] for vacation.  Subjective:   OSA (obstructive sleep apnea). Candace [name] is using CPAP consistently and notes feeling better. She is followed by Neurology.  Prediabetes. Candace [name] has a diagnosis of prediabetes based on her elevated HgA1c and was informed this puts her at greater risk of developing diabetes. She continues to work on diet and exercise to decrease her risk of diabetes. Candace [name] is on metformin. No nausea, vomiting, diarrhea, or polyphagia.   Lab Results  Component Value Date   HGBA1C 6.0 (H) 09/08/2019   Lab Results  Component Value Date   INSULIN 12.6 09/08/2019   INSULIN 23.8 02/04/2019   INSULIN 20.6 08/06/2018   INSULIN 15.2 02/18/2018   INSULIN 24.2 09/17/2017   At risk for diabetes mellitus. Candace [name] is at higher than average risk for developing diabetes due to her obesity.   Assessment/Plan:   OSA (obstructive sleep apnea). Intensive lifestyle modifications are the first line treatment for this issue. We discussed several lifestyle modifications today and she will continue to work on diet, exercise and weight loss efforts. We will continue to monitor. Orders and follow up as documented in patient record. Candace [name] will continue with CPAP use as directed.  Counseling  Sleep apnea is a condition in which breathing  pauses or becomes shallow during sleep. This happens over and over during the night. This disrupts your sleep and keeps your body from getting the rest that it needs, which can cause tiredness and lack of energy (fatigue) during the day.  Sleep apnea treatment: If you were given a device to open your airway while you sleep, USE IT!  Sleep hygiene:   Limit or avoid alcohol, caffeinated beverages, and cigarettes, especially close to bedtime.   Do not eat a large meal or eat spicy foods right before bedtime. This can lead to digestive discomfort that can make it hard for you to sleep.  Keep a sleep diary to help you and your health care provider figure out what could be causing your insomnia.  . Make your bedroom a dark, comfortable place where it is easy to fall asleep. ? Put up shades or blackout curtains to block light from outside. ? Use a white noise machine to block noise. ? Keep the temperature cool. . Limit screen use before bedtime. This includes: ? Watching TV. ? Using your smartphone, tablet, or computer. . Stick to a routine that includes going to bed and waking up at the same times every day and night. This can help you fall asleep faster. Consider making a quiet activity, such as reading, part of your nighttime routine. . Try to avoid taking naps during the day so that you sleep better at night. . Get out of bed if you are still awake after 15 minutes of trying to sleep. Keep the lights down,  but try reading or doing a quiet activity. When you feel sleepy, go back to bed.  Prediabetes. Candace [name] will continue to work on weight loss, exercise, and decreasing simple carbohydrates to help decrease the risk of diabetes. Prescription was given for liraglutide (VICTOZA) 18 MG/3ML SOPN 1.8 mg start 0.6 mg #3 with 0 refills. Refill was given for metFORMIN (GLUCOPHAGE) 500 MG tablet BID #60 with 0 refills.  At risk for diabetes mellitus. Candace [name] was given approximately 15 minutes of diabetes  education and counseling today. We discussed intensive lifestyle modifications today with an emphasis on weight loss as well as increasing exercise and decreasing simple carbohydrates in her diet. We also reviewed medication options with an emphasis on risk versus benefit of those discussed.   Repetitive spaced learning was employed today to elicit superior memory formation and behavioral change.  Class 2 severe obesity with serious comorbidity and body mass index (BMI) of 38.0 to 38.9 in adult, unspecified obesity type ([place]).  Candace [name] is currently in the action stage of change. As such, her goal is to continue with weight loss efforts. She has agreed to the Category 4 Plan.   Exercise goals: For substantial health benefits, adults should do at least 150 minutes (2 hours and 30 minutes) a week of moderate-intensity, or 75 minutes (1 hour and 15 minutes) a week of vigorous-intensity aerobic physical activity, or an equivalent combination of moderate- and vigorous-intensity aerobic activity. Aerobic activity should be performed in episodes of at least 10 minutes, and preferably, it should be spread throughout the week.  Behavioral modification strategies: meal planning and cooking strategies and keeping healthy foods in the home.  Candace [name] has agreed to follow-up with our clinic in 3 weeks. She was informed of the importance of frequent follow-up visits to maximize her success with intensive lifestyle modifications for her multiple health conditions.   Objective:   Blood pressure 137/74, pulse 88, temperature 97.9 F (36.6 C), height 5\' 7"  (1.702 m), weight 245 lb (111.1 kg), SpO2 97 %. Body mass index is 38.37 kg/m.  General: Cooperative, alert, well developed, in no acute distress. HEENT: Conjunctivae and lids unremarkable. Cardiovascular: Regular rhythm.  Lungs: Normal work of breathing. Neurologic: No focal deficits.   Lab Results  Component Value Date   CREATININE 0.72 09/08/2019   BUN  11 09/08/2019   NA 141 09/08/2019   K 4.3 09/08/2019   CL 100 09/08/2019   CO2 24 09/08/2019   Lab Results  Component Value Date   ALT 29 09/08/2019   AST 22 09/08/2019   ALKPHOS 101 09/08/2019   BILITOT 0.3 09/08/2019   Lab Results  Component Value Date   HGBA1C 6.0 (H) 09/08/2019   HGBA1C 6.2 (H) 02/04/2019   HGBA1C 5.9 (H) 08/06/2018   HGBA1C 5.9 (H) 02/18/2018   HGBA1C 6.2 (H) 09/17/2017   Lab Results  Component Value Date   INSULIN 12.6 09/08/2019   INSULIN 23.8 02/04/2019   INSULIN 20.6 08/06/2018   INSULIN 15.2 02/18/2018   INSULIN 24.2 09/17/2017   Lab Results  Component Value Date   TSH 2.390 09/17/2017   Lab Results  Component Value Date   CHOL 195 09/08/2019   HDL 55 09/08/2019   LDLCALC 95 09/08/2019   TRIG 271 (H) 09/08/2019   Lab Results  Component Value Date   WBC 7.1 04/15/2019   HGB 13.4 04/15/2019   HCT 41.4 04/15/2019   MCV 83.8 04/15/2019   PLT 297 04/15/2019   No results found for: IRON, TIBC,  FERRITIN  Attestation Statements:   Reviewed by clinician on day of visit: allergies, medications, problem list, medical history, surgical history, family history, social history, and previous encounter notes.  IMichaelene Song, am acting as transcriptionist for [name], PA-C   I have reviewed the above documentation for accuracy and completeness, and I agree with the above. [name], PA-C

## 2020-01-07 ENCOUNTER — Encounter: Payer: Self-pay | Admitting: Family Medicine

## 2020-01-08 DIAGNOSIS — G4733 Obstructive sleep apnea (adult) (pediatric): Secondary | ICD-10-CM | POA: Diagnosis not present

## 2020-01-17 MED FILL — ESOMEPRAZOLE MAG DR 40 MG C: 40 | 90 days supply | Qty: 90 | Fill #0

## 2020-01-17 MED FILL — TRIAMTERENE-HCTZ 37.5-25 MG: 37.5-25 | 90 days supply | Qty: 90 | Fill #1

## 2020-01-17 MED FILL — ANASTROZOLE 1 MG TABLET: 1 | 90 days supply | Qty: 90 | Fill #0

## 2020-01-24 ENCOUNTER — Encounter: Payer: Self-pay | Admitting: Family Medicine

## 2020-01-26 ENCOUNTER — Ambulatory Visit (INDEPENDENT_AMBULATORY_CARE_PROVIDER_SITE_OTHER): Payer: 59 | Admitting: Physician Assistant

## 2020-01-26 ENCOUNTER — Other Ambulatory Visit (INDEPENDENT_AMBULATORY_CARE_PROVIDER_SITE_OTHER): Payer: Self-pay | Admitting: Physician Assistant

## 2020-01-26 ENCOUNTER — Encounter (INDEPENDENT_AMBULATORY_CARE_PROVIDER_SITE_OTHER): Payer: Self-pay | Admitting: Physician Assistant

## 2020-01-26 ENCOUNTER — Ambulatory Visit: Payer: 59 | Admitting: Neurology

## 2020-01-26 ENCOUNTER — Other Ambulatory Visit: Payer: Self-pay

## 2020-01-26 ENCOUNTER — Encounter: Payer: Self-pay | Admitting: Neurology

## 2020-01-26 VITALS — BP 147/83 | HR 92 | Ht 67.0 in | Wt 246.2 lb

## 2020-01-26 VITALS — BP 132/83 | HR 80 | Temp 98.5°F | Ht 67.0 in | Wt 243.0 lb

## 2020-01-26 DIAGNOSIS — Z9189 Other specified personal risk factors, not elsewhere classified: Secondary | ICD-10-CM

## 2020-01-26 DIAGNOSIS — R7303 Prediabetes: Secondary | ICD-10-CM | POA: Diagnosis not present

## 2020-01-26 DIAGNOSIS — G4733 Obstructive sleep apnea (adult) (pediatric): Secondary | ICD-10-CM | POA: Diagnosis not present

## 2020-01-26 DIAGNOSIS — F3289 Other specified depressive episodes: Secondary | ICD-10-CM | POA: Diagnosis not present

## 2020-01-26 DIAGNOSIS — E781 Pure hyperglyceridemia: Secondary | ICD-10-CM | POA: Diagnosis not present

## 2020-01-26 DIAGNOSIS — Z9989 Dependence on other enabling machines and devices: Secondary | ICD-10-CM

## 2020-01-26 DIAGNOSIS — E66812 Obesity, class 2: Secondary | ICD-10-CM

## 2020-01-26 DIAGNOSIS — Z6838 Body mass index (BMI) 38.0-38.9, adult: Secondary | ICD-10-CM | POA: Diagnosis not present

## 2020-01-26 MED ORDER — WEGOVY 0.25 MG/0.5ML ~~LOC~~ SOAJ: 0.2500 mg | Freq: Every day | SUBCUTANEOUS | 0 refills | Status: DC

## 2020-01-26 MED ORDER — METFORMIN HCL 500 MG PO TABS: 500.0000 mg | ORAL_TABLET | Freq: Two times a day (BID) | ORAL | 0 refills | Status: DC

## 2020-01-26 MED FILL — WEGOVY 0.25 MG/0.5ML SOAJ: 0.25 | 28 days supply | Qty: 2 | Fill #0

## 2020-01-26 NOTE — Patient Instructions (Addendum)
It was good to see you again today.  I think you are adjusting well to your CPAP machine, keep up the good work.  Your chest a little shy of waiting for the insurance requirement of using the machine 70% with more than 4-hour usage.    Please continue using your CPAP regularly. While your insurance requires that you use CPAP at least 4 hours each night on 70% of the nights, I recommend, that you not skip any nights and use it throughout the night if you can. Getting used to CPAP and staying with the treatment long term does take time and patience and discipline. Untreated obstructive sleep apnea when it is moderate to severe can have an adverse impact on cardiovascular health and raise her risk for heart disease, arrhythmias, hypertension, congestive heart failure, stroke and diabetes. Untreated obstructive sleep apnea causes sleep disruption, nonrestorative sleep, and sleep deprivation. This can have an impact on your day to day functioning and cause daytime sleepiness and impairment of cognitive function, memory loss, mood disturbance, and problems focussing. Using CPAP regularly can improve these symptoms.  With time, you may notice benefit to your daytime energy, metabolism, and also restless leg symptoms.  Please follow-up to see one of our nurse practitioners in 3 months and hopefully we can consider seeing you once a year after that, if all goes well.

## 2020-01-26 NOTE — Progress Notes (Signed)
Subjective:    Patient ID: MELAH EBLING is a 59 y.o. female.  HPI     Interim history:  Ms. Hane is a 59 year old right-handed woman with an underlying medical history of prediabetes, seasonal allergies, restless leg syndrome, reflux disease, hyperlipidemia, depression, anxiety, breast cancer, with status post radiation treatment after lumpectomy, vitamin D deficiency and obesity, who Presents for follow-up consultation of her obstructive sleep apnea, after interim sleep study testing and starting CPAP therapy.  The patient is unaccompanied today.  I first met her on 09/15/2019 as a referral from weight management for concern for sleep apnea.  She reported snoring and excessive daytime somnolence at the time as well as difficulty initiating and maintaining sleep and a history of restless leg syndrome.  She was advised to proceed with sleep study testing.  He had a baseline sleep study, followed by a CPAP titration study.  Her baseline sleep study from 10/05/2019 indicated severe obstructive sleep apnea with a total AHI of 53.7/h, supine AHI of 110.5/h, O2 nadir of 78%.  She had absence of REM sleep during that test.  She was advised to return for a full night titration study, which she had on 11/04/2019.  She was fitted with a small full facemask and titrated from 5 cm to 12 cm.  On the final pressure of 12 cm, her AHI was 0/h with supine non-REM sleep achieved an O2 nadir of 89%.  She achieved 18.6% of sleep during the test.  She was advised to proceed with CPAP therapy at home, set up date was 12/09/2019. Today, 01/26/2020: I reviewed her CPAP Compliance data from 12/09/2019 through 01/07/2020, which is a total of 30 days, during which time she used her machine every night with percent use days greater than 4 hours at 67%, indicating slightly suboptimal compliance.  Average usage for days on treatment of 4 hours and 10 minutes, residual AHI 3.8/h, leak on the higher end with a 95th percentile at 18 L/min on a  pressure of 12 cm with EPR of 3.  In the past couple of weeks her compliance has declined a little bit, average usage for days on treatment is about 3 hours.  She reports that she is still adjusting to the machine.  Sometimes she admits that she is falling asleep on the couch and slept for a couple of hours before she woke up and realized that she had not put the mask on.  She uses a full facemask, she has noticed some benefit her daytime energy and improvement in her restless leg symptoms although she is also taking a slightly higher dose of her ropinirole.  She is currently on 0.5 mg at bedtime.  She reports that she had flareup of her restless leg symptoms after the baseline sleep study.  She is motivated to continue with her CPAP and has no major complaints about it.  She has taken it to a trip to [place] recently.  She also took a trip to [place] with her family but was not able to stay.  Hence the lapses in treatment even and around January 09, 2020.  The patient's allergies, current medications, family history, past medical history, past social history, past surgical history and problem list were reviewed and updated as appropriate.   Previously:   09/15/19: (She) reports snoring and excessive daytime somnolence at times, and a longer standing history of difficulty initiating and maintaining sleep.  She has restless leg syndrome and takes Requip for this, it may take  3 hours to take effect.  She denies any gasping sensations or witnessed apneas but has been sleeping alone.  She is divorced.  She lives with her 2 teenage daughters, ages 44 and 34.  They have 3 dogs in the household, 2 of them sleep on the bed with her, she has a king-size bed.  She does have a TV on at night.  It does not have to be on in her bedroom all night long.  She does look at her phone when she cannot sleep through the night.  She has nocturia about once or twice per average night and has woken up with the occasional morning headache.   Bedtime is generally around 11 PM and rise time around 6 AM.  She works 12-hour shifts on Saturdays, Sundays and Mondays.  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  She drinks caffeine in the form of coffee, 3 cups in the morning and one soda during the day.  She is a non-smoker, she reports that she quit drinking alcohol in her 6s.  She sees her oncologist once a year, she is on Arimidex.  I reviewed your office note from 08/24/2019.  Her Epworth sleepiness score is 8 out of 24, fatigue severity score is 23 out of 63.  Her Past Medical History Is Significant For: Past Medical History:  Diagnosis Date  . Abnormal Pap smear   . Alcohol abuse   . Anovulation   . Anxiety   . Breast cancer ([place]) 04/05/2016   right breast  . Cancer ([place]) 2017   right breast cancer  . Complication of anesthesia    slow to wake up  . Depression   . Endometrial hyperplasia   . Endometrial polyp    h/o  . Epidermal cyst    h/o  . Gallbladder problem   . GERD (gastroesophageal reflux disease)   . H/O hemorrhoids   . H/O menorrhagia   . High risk HPV infection   . Hyperlipidemia   . Insomnia   . Obesity   . Oligomenorrhea   . Prediabetes   . Restless legs syndrome   . Seasonal allergies   . Vitamin D deficiency     Her Past Surgical History Is Significant For: Past Surgical History:  Procedure Laterality Date  . BREAST LUMPECTOMY Right 05/2016   radiation  . BREAST LUMPECTOMY WITH RADIOACTIVE SEED AND SENTINEL LYMPH NODE BIOPSY Right 05/28/2016   Procedure: RADIOACTIVE SEED GUIDED RIGHT BREAST LUMPECTOMY WITH  RIGHT AXILLARY SENTINEL LYMPH NODE BIOPSY;  Surgeon: [name], MD;  Location: [facility];  Service: General;  Laterality: Right;  . CARPAL TUNNEL RELEASE  03/26/2011   right hand  . CARPAL TUNNEL RELEASE  05/28/2011   Procedure: CARPAL TUNNEL RELEASE;  Surgeon: [name];  Location: [facility];  Service: Orthopedics;  Laterality: Left;  . CHOLECYSTECTOMY N/A  01/24/2013   Procedure: LAPAROSCOPIC CHOLECYSTECTOMY WITH INTRAOPERATIVE CHOLANGIOGRAM;  Surgeon: [name], MD;  Location: [facility];  Service: General;  Laterality: N/A;  . COLONOSCOPY    . HYSTEROSCOPY    . POLYPECTOMY    . TONSILLECTOMY  1967   as child  . trigger fingers  8 yrs. ago   both done months apart  . uterine ablation  06/04/2010  . uterine ablation      Her Family History Is Significant For: Family History  Problem Relation Age of Onset  . Heart disease Mother   . Hypertension Mother   . Stroke Mother   .  Dementia Mother        vascular dementia  . Hyperlipidemia Mother   . Depression Mother   . Anxiety disorder Mother   . Alcohol abuse Mother   . Obesity Mother   . Pulmonary fibrosis Father        d. 31  . Depression Father   . Anxiety disorder Father   . Alcohol abuse Father   . Heart attack Maternal Grandmother        d. [phone]  . Heart disease Maternal Grandmother   . Heart disease Maternal Grandfather        d. 60-63  . Leukemia Paternal Grandmother        d. late 42s  . Breast cancer Sister 4       IDC s/p mastectomy and chemo    Her Social History Is Significant For: Social History   Socioeconomic History  . Marital status: Divorced    Spouse name: Not on file  . Number of children: 2  . Years of education: Not on file  . Highest education level: Not on file  Occupational History  . Not on file  Tobacco Use  . Smoking status: Never Smoker  . Smokeless tobacco: Never Used  Substance and Sexual Activity  . Alcohol use: No    Comment: none since 1996  . Drug use: No  . Sexual activity: Not Currently    Birth control/protection: None  Other Topics Concern  . Not on file  Social History Narrative  . Not on file   Social Determinants of Health   Financial Resource Strain:   . Difficulty of Paying Living Expenses:   Food Insecurity:   . Worried About Charity fundraiser in the Last Year:   . Arboriculturist in the Last Year:    Transportation Needs:   . Film/video editor (Medical):   [name] Lack of Transportation (Non-Medical):   Physical Activity:   . Days of Exercise per Week:   . Minutes of Exercise per Session:   Stress:   . Feeling of Stress :   Social Connections:   . Frequency of Communication with Friends and Family:   . Frequency of Social Gatherings with Friends and Family:   . Attends Religious Services:   . Active Member of Clubs or Organizations:   . Attends Archivist Meetings:   [name] Marital Status:     Her Allergies Are:  No Known Allergies:   Her Current Medications Are:  Outpatient Encounter Medications as of 01/26/2020  Medication Sig  . anastrozole (ARIMIDEX) 1 MG tablet Take 1 tablet (1 mg total) by mouth daily.  [name] buPROPion (WELLBUTRIN XL) 300 MG 24 hr tablet Take 300 mg by mouth daily.   . cetirizine (ZYRTEC) 10 MG tablet Take 10 mg by mouth daily. [facility]  . Cholecalciferol (VITAMIN D-3) 125 MCG (5000 UT) TABS Take 5,000 Units by mouth daily. (Patient taking differently: Take 50 mg by mouth daily. )  . esomeprazole (NEXIUM) 40 MG capsule Take 40 mg by mouth every 36 (thirty-six) hours.  [name] FLUoxetine (PROZAC) 10 MG capsule Take 10 mg by mouth daily with lunch.   . liraglutide (VICTOZA) 18 MG/3ML SOPN Inject 0.3 mLs (1.8 mg total) into the skin once a week.  . metFORMIN (GLUCOPHAGE) 500 MG tablet Take 1 tablet (500 mg total) by mouth 2 (two) times daily with a meal.  . Multiple Vitamin (MULTIVITAMIN WITH MINERALS) TABS tablet Take 1 tablet by  mouth daily.  [name] rOPINIRole (REQUIP) 0.25 MG tablet Take 0.5 mg by mouth daily at 8 pm.  . rosuvastatin (CRESTOR) 10 MG tablet Take 10 mg by mouth at bedtime.  . triamterene-hydrochlorothiazide (MAXZIDE-25) 37.5-25 MG tablet Take 1 tablet by mouth daily.   No facility-administered encounter medications on file as of 01/26/2020.  :  Review of Systems:  Out of a complete 14 point review of systems, all are  reviewed and negative with the exception of these symptoms as listed below: Review of Systems  Neurological:       Reports today for a her initial CPAP follow up. States she is still getting used to the machine and mask. Is able to sleep longer periods of time. States the machine is recording less hours than she is sleeping.   Epworth Sleepiness Scale 0= would never doze 1= slight chance of dozing 2= moderate chance of dozing 3= high chance of dozing  Sitting and reading: 1 Watching TV: 3 Sitting inactive in a public place (ex. Theater or meeting): 1 As a passenger in a car for an hour without a break: 0 Lying down to rest in the afternoon: 2 Sitting and talking to someone: 0 Sitting quietly after lunch (no alcohol): 1 In a car, while stopped in traffic:0 Total: 8     Objective:  Neurological Exam  Physical Exam Physical Examination:   Vitals:   01/26/20 1301  BP: (!) 147/83  Pulse: 92    General Examination: The patient is a very pleasant 59 y.o. female in no acute distress. She appears well-developed and well-nourished and well groomed.   HEENT: Normocephalic, atraumatic, pupils are equal, round and reactive to light, extraocular tracking is good without limitation to gaze excursion or nystagmus noted.  Corrective eyeglasses in place. Hearing is grossly intact. Face is symmetric with normal facial animation. Speech is clear with no dysarthria noted.   Chest: Clear to auscultation without wheezing, rhonchi or crackles noted.  Heart: S1+S2+0, regular and normal without murmurs, rubs or gallops noted.   Abdomen: Soft, non-tender and non-distended with normal bowel sounds appreciated on auscultation.  Extremities: There is no obv. edema in the distal lower extremities bilaterally.   Skin: Warm and dry without trophic changes noted.   Musculoskeletal: exam reveals no obvious joint deformities, tenderness or joint swelling or erythema.   Neurologically:  Mental  status: The patient is awake, alert and oriented in all 4 spheres. Her immediate and remote memory, attention, language skills and fund of knowledge are appropriate. There is no evidence of aphasia, agnosia, apraxia or anomia. Speech is clear with normal prosody and enunciation. Thought process is linear. Mood is normal and affect is normal.  Cranial nerves II - XII are as described above under HEENT exam.  Motor exam: Normal bulk, strength and tone is noted. There is no tremor, Romberg is negative. Fine motor skills and coordination: grossly intact.  Cerebellar testing: No dysmetria or intention tremor. There is no truncal or gait ataxia.  Sensory exam: intact to light touch in the upper and lower extremities.  Gait, station and balance: She stands easily. No veering to one side is noted. No leaning to one side is noted. Posture is age-appropriate and stance is narrow based. Gait shows no abn. findings.  Assessment and Plan:    In summary, Erna Brossard Heitman is a very pleasant 59 year old female  with an underlying medical history of prediabetes, seasonal allergies, restless leg syndrome, reflux disease, hyperlipidemia, depression, anxiety, breast cancer,  with status post radiation treatment after lumpectomy, vitamin D deficiency and obesity, who presents for follow-up consultation of her obstructive sleep apnea.  She was found to have severe obstructive sleep apnea, she had a baseline sleep study on 10/05/2019 with an AHI of 53.7/h, O2 nadir of 78%.  She did not achieve REM sleep during the first study and had moderate PLM's.  She had a subsequent CPAP titration study on 11/04/2019.  She has been on CPAP therapy since early June.  She has noticed improvement in her restless leg symptoms and in fact in her titration study she had no significant PLM's.  She continues to use her CPAP, she has improved her compliance and is commended for her treatment adherence thus far.  With time, she will hopefully also reap more  benefit from her treatment in regards to her weight, metabolism, restless leg symptoms, and daytime energy.  She is advised to follow-up in about 3 months to see one of our nurse practitioners to make sure she continues to do well and her compliance looks good and hopefully we can see her yearly thereafter.  If she achieves a significant amount of weight loss we can also consider repeating her sleep evaluation with a home sleep test in the future.  I answered all her questions today and she was in agreement.   I spent 30 minutes in total face-to-face time and in reviewing records during pre-charting, more than 50% of which was spent in counseling and coordination of care, reviewing test results, reviewing medications and treatment regimen and/or in discussing or reviewing the diagnosis of OSA, the prognosis and treatment options. Pertinent laboratory and imaging test results that were available during this visit with the patient were reviewed by me and considered in my medical decision making (see chart for details).

## 2020-01-27 NOTE — Progress Notes (Signed)
Chief Complaint:   OBESITY Candace [name] is here to discuss her progress with her obesity treatment plan along with follow-up of her obesity related diagnoses. Candace [name] is on the Category 4 Plan and states she is following her eating plan approximately 30% of the time. Candace [name] states she is walking 20 minutes 4 times per week.  Today's visit was #: 54 Starting weight: 249 lbs Starting date: 09/17/2017 Today's weight: 243 lbs Today's date: 01/26/2020 Total lbs lost to date: 6 Total lbs lost since last in-office visit: 2  Interim History: Candace [name] reports that her appetite has been decreased due to stress with a recent vacation. She notes that stress greatly affects her food choices.  Subjective:   Prediabetes. Candace [name] has a diagnosis of prediabetes based on her elevated HgA1c and was informed this puts her at greater risk of developing diabetes. She continues to work on diet and exercise to decrease her risk of diabetes. Candace [name] is on metformin. No nausea, vomiting, or diarrhea. She is not taking Victoza due to cost.  Lab Results  Component Value Date   HGBA1C 6.0 (H) 09/08/2019   Lab Results  Component Value Date   INSULIN 12.6 09/08/2019   INSULIN 23.8 02/04/2019   INSULIN 20.6 08/06/2018   INSULIN 15.2 02/18/2018   INSULIN 24.2 09/17/2017   Hypertriglyceridemia. Candace [name] is on no medication. She is reducing simple carbs in her diet. Last triglyceride level was elevated at 271 on 09/08/2019.  Other depression, with emotional eating. Candace [name] is struggling with emotional eating and using food for comfort to the extent that it is negatively impacting her health. She has been working on behavior modification techniques to help reduce her emotional eating and has been somewhat successful. She shows no sign of suicidal or homicidal ideations. Candace [name] reports stress and emotional eating and sometimes is not eating when very stressed. She is on Prozac.  At risk for heart disease. Candace [name] is at  a higher than average risk for cardiovascular disease due to obesity.   Assessment/Plan:   Prediabetes. Candace [name] will continue to work on weight loss, exercise, and decreasing simple carbohydrates to help decrease the risk of diabetes. Refill was given for metFORMIN (GLUCOPHAGE) 500 MG tablet #60 with 0 refills. She will start taking Semaglutide-Weight Management (WEGOVY) 0.25 MG/0.5ML SOAJ SQ daily #1 pen with 0 refills.  Hypertriglyceridemia. Marjon will continue with the meal plan as directed.   Other depression, with emotional eating. Behavior modification techniques were discussed today to help Candace [name] deal with her emotional/non-hunger eating behaviors.  Orders and follow up as documented in patient record. Candace [name] will be referred to Dr. Mallie Mussel, our bariatric psychologist, for evaluation.  At risk for heart disease. Candace [name] was given approximately 15 minutes of coronary artery disease prevention counseling today. She is 60 y.o. female and has risk factors for heart disease including obesity. We discussed intensive lifestyle modifications today with an emphasis on specific weight loss instructions and strategies.   Repetitive spaced learning was employed today to elicit superior memory formation and behavioral change.  Class 2 severe obesity with serious comorbidity and body mass index (BMI) of 38.0 to 38.9 in adult, unspecified obesity type (Candace [name]).  Candace [name] is currently in the action stage of change. As such, her goal is to continue with weight loss efforts. She has agreed to the Category 4 Plan.   Exercise goals: For substantial health benefits, adults should do at least 150 minutes (2 hours and 30 minutes) a week of moderate-intensity, or 75  minutes (1 hour and 15 minutes) a week of vigorous-intensity aerobic physical activity, or an equivalent combination of moderate- and vigorous-intensity aerobic activity. Aerobic activity should be performed in episodes of at least 10 minutes, and preferably,  it should be spread throughout the week.  Behavioral modification strategies: increasing vegetables, meal planning and cooking strategies and keeping healthy foods in the home.  Candace [name] has agreed to follow-up with our clinic fasting in 3 weeks. She was informed of the importance of frequent follow-up visits to maximize her success with intensive lifestyle modifications for her multiple health conditions.   Objective:   Blood pressure (!) 132/83, pulse 80, temperature 98.5 F (36.9 C), temperature source Oral, height 5\' 7"  (1.702 m), weight (!) 243 lb (110.2 kg), SpO2 97 %. Body mass index is 38.06 kg/m.  General: Cooperative, alert, well developed, in no acute distress. HEENT: Conjunctivae and lids unremarkable. Cardiovascular: Regular rhythm.  Lungs: Normal work of breathing. Neurologic: No focal deficits.   Lab Results  Component Value Date   CREATININE 0.72 09/08/2019   BUN 11 09/08/2019   NA 141 09/08/2019   K 4.3 09/08/2019   CL 100 09/08/2019   CO2 24 09/08/2019   Lab Results  Component Value Date   ALT 29 09/08/2019   AST 22 09/08/2019   ALKPHOS 101 09/08/2019   BILITOT 0.3 09/08/2019   Lab Results  Component Value Date   HGBA1C 6.0 (H) 09/08/2019   HGBA1C 6.2 (H) 02/04/2019   HGBA1C 5.9 (H) 08/06/2018   HGBA1C 5.9 (H) 02/18/2018   HGBA1C 6.2 (H) 09/17/2017   Lab Results  Component Value Date   INSULIN 12.6 09/08/2019   INSULIN 23.8 02/04/2019   INSULIN 20.6 08/06/2018   INSULIN 15.2 02/18/2018   INSULIN 24.2 09/17/2017   Lab Results  Component Value Date   TSH 2.390 09/17/2017   Lab Results  Component Value Date   CHOL 195 09/08/2019   HDL 55 09/08/2019   LDLCALC 95 09/08/2019   TRIG 271 (H) 09/08/2019   Lab Results  Component Value Date   WBC 7.1 04/15/2019   HGB 13.4 04/15/2019   HCT 41.4 04/15/2019   MCV 83.8 04/15/2019   PLT 297 04/15/2019   No results found for: IRON, TIBC, FERRITIN  Attestation Statements:   Reviewed by clinician  on day of visit: allergies, medications, problem list, medical history, surgical history, family history, social history, and previous encounter notes.  IMichaelene Song, am acting as transcriptionist for [name], PA-C   I have reviewed the above documentation for accuracy and completeness, and I agree with the above. [name], PA-C
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
## 2020-02-08 ENCOUNTER — Ambulatory Visit (INDEPENDENT_AMBULATORY_CARE_PROVIDER_SITE_OTHER): Payer: 59 | Admitting: Family Medicine

## 2020-02-08 DIAGNOSIS — G4733 Obstructive sleep apnea (adult) (pediatric): Secondary | ICD-10-CM | POA: Diagnosis not present

## 2020-02-15 ENCOUNTER — Encounter (INDEPENDENT_AMBULATORY_CARE_PROVIDER_SITE_OTHER): Payer: Self-pay | Admitting: Physician Assistant

## 2020-02-15 ENCOUNTER — Other Ambulatory Visit: Payer: Self-pay

## 2020-02-15 ENCOUNTER — Ambulatory Visit (INDEPENDENT_AMBULATORY_CARE_PROVIDER_SITE_OTHER): Payer: 59 | Admitting: Psychology

## 2020-02-15 ENCOUNTER — Ambulatory Visit (INDEPENDENT_AMBULATORY_CARE_PROVIDER_SITE_OTHER): Payer: 59 | Admitting: Physician Assistant

## 2020-02-15 VITALS — BP 148/85 | HR 92 | Temp 98.3°F | Ht 67.0 in | Wt 248.0 lb

## 2020-02-15 DIAGNOSIS — G4733 Obstructive sleep apnea (adult) (pediatric): Secondary | ICD-10-CM

## 2020-02-15 DIAGNOSIS — Z6838 Body mass index (BMI) 38.0-38.9, adult: Secondary | ICD-10-CM

## 2020-02-15 DIAGNOSIS — R7303 Prediabetes: Secondary | ICD-10-CM | POA: Diagnosis not present

## 2020-02-15 DIAGNOSIS — Z9189 Other specified personal risk factors, not elsewhere classified: Secondary | ICD-10-CM | POA: Diagnosis not present

## 2020-02-15 DIAGNOSIS — F5089 Other specified eating disorder: Secondary | ICD-10-CM

## 2020-02-15 MED ORDER — METFORMIN HCL 500 MG PO TABS: 500.0000 mg | ORAL_TABLET | Freq: Two times a day (BID) | ORAL | 0 refills | Status: DC

## 2020-02-15 MED FILL — METFORMIN HCL 500 MG TABS: 500 | 30 days supply | Qty: 60 | Fill #0

## 2020-02-16 NOTE — Progress Notes (Signed)
Chief Complaint:   OBESITY Candace [name] is here to discuss her progress with her obesity treatment plan along with follow-up of her obesity related diagnoses. Candace [name] is on the Category 4 Plan and states she is following her eating plan approximately 60% of the time. Candace [name] states she is walking 20-30 minutes 4 times per week.  Today's visit was #: 8 Starting weight: 249 lbs Starting date: 09/17/2017 Today's weight: 248 lbs Today's date: 02/15/2020 Total lbs lost to date: 1 Total lbs lost since last in-office visit: 0  Interim History: Candace [name] reports that she has been eating out more, especially fast food. She wants to be able to be creative with her meals.  Subjective:   OSA (obstructive sleep apnea). Candace [name] is followed by Neurology. She is wearing her CPAP regularly.  Prediabetes. Candace [name] has a diagnosis of prediabetes based on her elevated HgA1c and was informed this puts her at greater risk of developing diabetes. She continues to work on diet and exercise to decrease her risk of diabetes. Candace [name] is on metformin. No nausea, vomiting, or diarrhea.   Lab Results  Component Value Date   HGBA1C 6.0 (H) 09/08/2019   Lab Results  Component Value Date   INSULIN 12.6 09/08/2019   INSULIN 23.8 02/04/2019   INSULIN 20.6 08/06/2018   INSULIN 15.2 02/18/2018   INSULIN 24.2 09/17/2017   At risk for side effect of medication. Candace [name] is at risk of side effects from starting new drug, Saxenda.  Assessment/Plan:   OSA (obstructive sleep apnea). Intensive lifestyle modifications are the first line treatment for this issue. We discussed several lifestyle modifications today and she will continue to work on diet, exercise and weight loss efforts. We will continue to monitor. Orders and follow up as documented in patient record. Candace [name] will continue with CPAP as directed. She will follow-up with Neurology as scheduled and as directed.   Counseling  Sleep apnea is a condition in which  breathing pauses or becomes shallow during sleep. This happens over and over during the night. This disrupts your sleep and keeps your body from getting the rest that it needs, which can cause tiredness and lack of energy (fatigue) during the day.  Sleep apnea treatment: If you were given a device to open your airway while you sleep, USE IT!  Sleep hygiene:   Limit or avoid alcohol, caffeinated beverages, and cigarettes, especially close to bedtime.   Do not eat a large meal or eat spicy foods right before bedtime. This can lead to digestive discomfort that can make it hard for you to sleep.  Keep a sleep diary to help you and your health care provider figure out what could be causing your insomnia.  . Make your bedroom a dark, comfortable place where it is easy to fall asleep. ? Put up shades or blackout curtains to block light from outside. ? Use a white noise machine to block noise. ? Keep the temperature cool. . Limit screen use before bedtime. This includes: ? Watching TV. ? Using your smartphone, tablet, or computer. . Stick to a routine that includes going to bed and waking up at the same times every day and night. This can help you fall asleep faster. Consider making a quiet activity, such as reading, part of your nighttime routine. . Try to avoid taking naps during the day so that you sleep better at night. . Get out of bed if you are still awake after 15 minutes of trying to sleep.  Keep the lights down, but try reading or doing a quiet activity. When you feel sleepy, go back to bed.  Prediabetes. Candace [name] will continue to work on weight loss, exercise, and decreasing simple carbohydrates to help decrease the risk of diabetes. Refill was given for metFORMIN (GLUCOPHAGE) 500 MG tablet #60 with 0 refills.  At risk for side effect of medication. Candace [name] was given approximately 15 minutes of drug side effect counseling today.  We discussed side effect possibility and risk versus benefits.  Candace [name] agreed to the medication and will contact this office if these side effects are intolerable.  Repetitive spaced learning was employed today to elicit superior memory formation and behavioral change.  Class 2 severe obesity with serious comorbidity and body mass index (BMI) of 38.0 to 38.9 in adult, unspecified obesity type ([place]). Candace [name] will start Saxenda 0.6 mg QAM #5 pens.  Candace [name] is currently in the action stage of change. As such, her goal is to continue with weight loss efforts. She has agreed to the Category 4 Plan and will journal 550-700 calories and 45 grams of protein at supper.   Exercise goals: For substantial health benefits, adults should do at least 150 minutes (2 hours and 30 minutes) a week of moderate-intensity, or 75 minutes (1 hour and 15 minutes) a week of vigorous-intensity aerobic physical activity, or an equivalent combination of moderate- and vigorous-intensity aerobic activity. Aerobic activity should be performed in episodes of at least 10 minutes, and preferably, it should be spread throughout the week.  Behavioral modification strategies: meal planning and cooking strategies and keeping healthy foods in the home.  Candace [name] has agreed to follow-up with our clinic fasting for labs in 2 weeks. She was informed of the importance of frequent follow-up visits to maximize her success with intensive lifestyle modifications for her multiple health conditions.   Objective:   Blood pressure (!) 148/85, pulse 92, temperature 98.3 F (36.8 C), temperature source Oral, height 5\' 7"  (1.702 m), weight 248 lb (112.5 kg), SpO2 97 %. Body mass index is 38.84 kg/m.  General: Cooperative, alert, well developed, in no acute distress. HEENT: Conjunctivae and lids unremarkable. Cardiovascular: Regular rhythm.  Lungs: Normal work of breathing. Neurologic: No focal deficits.   Lab Results  Component Value Date   CREATININE 0.72 09/08/2019   BUN 11 09/08/2019   NA 141 09/08/2019    K 4.3 09/08/2019   CL 100 09/08/2019   CO2 24 09/08/2019   Lab Results  Component Value Date   ALT 29 09/08/2019   AST 22 09/08/2019   ALKPHOS 101 09/08/2019   BILITOT 0.3 09/08/2019   Lab Results  Component Value Date   HGBA1C 6.0 (H) 09/08/2019   HGBA1C 6.2 (H) 02/04/2019   HGBA1C 5.9 (H) 08/06/2018   HGBA1C 5.9 (H) 02/18/2018   HGBA1C 6.2 (H) 09/17/2017   Lab Results  Component Value Date   INSULIN 12.6 09/08/2019   INSULIN 23.8 02/04/2019   INSULIN 20.6 08/06/2018   INSULIN 15.2 02/18/2018   INSULIN 24.2 09/17/2017   Lab Results  Component Value Date   TSH 2.390 09/17/2017   Lab Results  Component Value Date   CHOL 195 09/08/2019   HDL 55 09/08/2019   LDLCALC 95 09/08/2019   TRIG 271 (H) 09/08/2019   Lab Results  Component Value Date   WBC 7.1 04/15/2019   HGB 13.4 04/15/2019   HCT 41.4 04/15/2019   MCV 83.8 04/15/2019   PLT 297 04/15/2019   No results found for: IRON,  TIBC, FERRITIN  Attestation Statements:   Reviewed by clinician on day of visit: allergies, medications, problem list, medical history, surgical history, family history, social history, and previous encounter notes.  IMichaelene Song, am acting as transcriptionist for [name], PA-C   I have reviewed the above documentation for accuracy and completeness, and I agree with the above. [name], PA-C

## 2020-02-16 NOTE — Progress Notes (Signed)
Office: [phone]  /  Fax: [phone]    Date: March 01, 2020   Appointment Start Time: 8:31am Duration: 30 minutes Provider: Glennie Isle, Psy.D. Type of Session: Individual Therapy  Location of Patient: Home Location of Provider: Provider's Home Type of Contact: Telepsychological Visit via MyChart Video Visit   Informed Consent: Prior to initiating telepsychological services, Candace [name] completed an informed consent document, which included the development of a safety plan (e.g., an emergency contact, nearest emergency room) in the event of an emergency/crisis. Candace [name] expressed understanding of the rationale of the safety plan. Candace [name] verbally acknowledged understanding she is ultimately responsible for understanding her insurance benefits for telepsychological and in-person services. This provider also reviewed confidentiality, as it relates to telepsychological services, as well as the rationale for telepsychological services (i.e., to reduce exposure risk to COVID-19). Candace [name]  acknowledged understanding that appointments cannot be recorded without both party consent and she is aware she is responsible for securing confidentiality on her end of the session. Candace [name] verbally consented to proceed.  Session Content: Candace [name] is a 59 y.o. female presenting for a follow-up appointment to address the previously established treatment goal of increasing coping skills. Today's appointment was a telepsychological visit due to COVID-19. Candace [name] provided verbal consent for today's telepsychological appointment and she is aware she is responsible for securing confidentiality on her end of the session. Prior to proceeding with today's appointment, Candace [name]'s physical location at the time of this appointment was obtained as well a phone number she could be reached at in the event of technical difficulties. Candace [name] and this provider participated in today's telepsychological service.   This provider conducted a  brief check-in. Candace [name] reported focusing on physical and emotional hunger. She observed when her schedule is altered, there is an increase in physical hunger as she is going long periods without eating. Candace [name] also reported being organized helps, especially on work days. She added, "I need structure in order to be disciplined." This was further explored and Candace [name] was engaged in problem solving to determine what activities (e.g., walking the dogs, engage in crafts, meal planning) could help with structure/routine. Moreover, psychoeducation regarding triggers for emotional eating was provided. Candace [name] was provided a handout, and encouraged to utilize the handout between now and the next appointment to increase awareness of triggers and frequency. Candace [name] agreed. This provider also discussed behavioral strategies for specific triggers, such as placing the utensil down when conversing to avoid mindless eating. Candace [name] provided verbal consent during today's appointment for this provider to send a handout about triggers via e-mail. Candace [name] was receptive to today's appointment as evidenced by openness to sharing, responsiveness to feedback, and willingness to explore triggers for emotional eating.  Mental Status Examination:  Appearance: well groomed and appropriate hygiene  Behavior: appropriate to circumstances Mood: euthymic Affect: mood congruent Speech: normal in rate, volume, and tone Eye Contact: appropriate Psychomotor Activity: appropriate Gait: unable to assess Thought Process: linear, logical, and goal directed  Thought Content/Perception: no hallucinations, delusions, bizarre thinking or behavior reported or observed and no evidence of suicidal and homicidal ideation, plan, and intent Orientation: time, person, place, and purpose of appointment Memory/Concentration: memory, attention, language, and fund of knowledge intact  Insight/Judgment: good  Interventions:  Conducted a brief chart  review Provided empathic reflections and validation Reviewed content from the previous session Employed supportive psychotherapy interventions to facilitate reduced distress and to improve coping skills with identified stressors Engaged patient in problem solving Psychoeducation provided regarding triggers for emotional eating  DSM-5 Diagnosis(es): 307.59 (  F50.8) Other Specified Feeding or Eating Disorder, Emotional Eating Behaviors  Treatment Goal & Progress: During the initial appointment with this provider, the following treatment goal was established: increase coping skills. Candace [name] has demonstrated progress in her goal as evidenced by increased awareness of hunger patterns.   Plan: The next appointment will be scheduled in 2-3 weeks, which will be via MyChart Video Visit. The next session will focus on working towards the established treatment goal.

## 2020-02-21 MED FILL — rOPINIRole HCL 1 MG TABS: 1 | 90 days supply | Qty: 90 | Fill #1

## 2020-03-01 ENCOUNTER — Telehealth (INDEPENDENT_AMBULATORY_CARE_PROVIDER_SITE_OTHER): Payer: 59 | Admitting: Psychology

## 2020-03-01 ENCOUNTER — Ambulatory Visit (INDEPENDENT_AMBULATORY_CARE_PROVIDER_SITE_OTHER): Payer: 59 | Admitting: Physician Assistant

## 2020-03-01 ENCOUNTER — Other Ambulatory Visit (INDEPENDENT_AMBULATORY_CARE_PROVIDER_SITE_OTHER): Payer: Self-pay | Admitting: Physician Assistant

## 2020-03-01 ENCOUNTER — Encounter (INDEPENDENT_AMBULATORY_CARE_PROVIDER_SITE_OTHER): Payer: Self-pay | Admitting: Physician Assistant

## 2020-03-01 ENCOUNTER — Other Ambulatory Visit: Payer: Self-pay

## 2020-03-01 VITALS — BP 142/83 | HR 83 | Temp 97.9°F | Ht 67.0 in | Wt 247.0 lb

## 2020-03-01 DIAGNOSIS — F5089 Other specified eating disorder: Secondary | ICD-10-CM | POA: Diagnosis not present

## 2020-03-01 DIAGNOSIS — E7849 Other hyperlipidemia: Secondary | ICD-10-CM | POA: Diagnosis not present

## 2020-03-01 DIAGNOSIS — R7303 Prediabetes: Secondary | ICD-10-CM | POA: Diagnosis not present

## 2020-03-01 DIAGNOSIS — Z6838 Body mass index (BMI) 38.0-38.9, adult: Secondary | ICD-10-CM

## 2020-03-01 DIAGNOSIS — Z9189 Other specified personal risk factors, not elsewhere classified: Secondary | ICD-10-CM | POA: Diagnosis not present

## 2020-03-01 MED ORDER — SAXENDA 18 MG/3ML ~~LOC~~ SOPN: 0.6000 mg | PEN_INJECTOR | Freq: Every day | SUBCUTANEOUS | 0 refills | Status: DC

## 2020-03-01 MED ORDER — BD PEN NEEDLE NANO 2ND GEN 32G X 4 MM MISC: 1.0000 | Freq: Every day | 0 refills | Status: DC

## 2020-03-02 NOTE — Progress Notes (Signed)
Chief Complaint:   OBESITY Maneh E Nine is here to discuss her progress with her obesity treatment plan along with follow-up of her obesity related diagnoses. Natanya is on the Category 4 Plan and states she is following her eating plan approximately 25% of the time. Shams states she is walking 20-30 minutes 4 times per week.  Today's visit was #: 56 Starting weight: 249 lbs Starting date: 09/17/2017 Today's weight: 247 lbs Today's date: 03/01/2020 Total lbs lost to date: 2 Total lbs lost since last in-office visit: 1  Interim History: Colton states she has not been following the plan well due to traveling more often. She also has been doing some boredom eating at home. She has been ordering out at work.  Subjective:   Prediabetes. Amandamarie has a diagnosis of prediabetes based on her elevated HgA1c and was informed this puts her at greater risk of developing diabetes. She continues to work on diet and exercise to decrease her risk of diabetes. She denies nausea or hypoglycemia. Aaren is on metformin, which she is tolerating well, and reports some polyphagia. She is exercising regularly.  Lab Results  Component Value Date   HGBA1C 6.0 (H) 09/08/2019   Lab Results  Component Value Date   INSULIN 12.6 09/08/2019   INSULIN 23.8 02/04/2019   INSULIN 20.6 08/06/2018   INSULIN 15.2 02/18/2018   INSULIN 24.2 09/17/2017   Other hyperlipidemia. Delaynie is on Crestor, which she is tolerating well. Her lipid panel is being monitored regularly.   Lab Results  Component Value Date   CHOL 195 09/08/2019   HDL 55 09/08/2019   LDLCALC 95 09/08/2019   TRIG 271 (H) 09/08/2019   Lab Results  Component Value Date   ALT 29 09/08/2019   AST 22 09/08/2019   ALKPHOS 101 09/08/2019   BILITOT 0.3 09/08/2019   The 10-year ASCVD risk score [name], et al., 2013) is: 4.3%   Values used to calculate the score:     Age: 59 years     Sex: Female     Is Non-Hispanic African American: No      Diabetic: No     Tobacco smoker: No     Systolic Blood Pressure: 497 mmHg     Is BP treated: Yes     HDL Cholesterol: 55 mg/dL     Total Cholesterol: 195 mg/dL  At risk for diabetes mellitus. Keyerra is at higher than average risk for developing diabetes due to her obesity.   Assessment/Plan:   Prediabetes. Ariyanah will continue to work on weight loss, exercise, and decreasing simple carbohydrates to help decrease the risk of diabetes. She will continue her medication as directed.   Other hyperlipidemia. Cardiovascular risk and specific lipid/LDL goals reviewed.  We discussed several lifestyle modifications today and Jung will continue to work on diet, exercise and weight loss efforts. Orders and follow up as documented in patient record. She will continue her medication as directed.   Counseling Intensive lifestyle modifications are the first line treatment for this issue. . Dietary changes: Increase soluble fiber. Decrease simple carbohydrates. . Exercise changes: Moderate to vigorous-intensity aerobic activity 150 minutes per week if tolerated. . Lipid-lowering medications: see documented in medical record.  At risk for diabetes mellitus. Darlyne was given approximately 15 minutes of diabetes education and counseling today. We discussed intensive lifestyle modifications today with an emphasis on weight loss as well as increasing exercise and decreasing simple carbohydrates in her diet. We also reviewed medication  options with an emphasis on risk versus benefit of those discussed.   Repetitive spaced learning was employed today to elicit superior memory formation and behavioral change.  Class 2 severe obesity with serious comorbidity and body mass index (BMI) of 38.0 to 38.9 in adult, unspecified obesity type ([place]). Prescriptions were given for Liraglutide - Weight Management (SAXENDA) 18 MG/3ML SOPN 0.6 mg QAM #5 pens and Insulin Pen Needle (BD PEN NEEDLE NANO 2ND GEN) 32G X 4 MM MISC  #100.  Jimesha is currently in the action stage of change. As such, her goal is to continue with weight loss efforts. She has agreed to keeping a food journal and adhering to recommended goals of 1800 calories and 115 grams of protein daily. Compromise and order out with coworkers only once a week, make a good choice, and then bring her lunch to work other days.   Exercise goals: For substantial health benefits, adults should do at least 150 minutes (2 hours and 30 minutes) a week of moderate-intensity, or 75 minutes (1 hour and 15 minutes) a week of vigorous-intensity aerobic physical activity, or an equivalent combination of moderate- and vigorous-intensity aerobic activity. Aerobic activity should be performed in episodes of at least 10 minutes, and preferably, it should be spread throughout the week.  Behavioral modification strategies: meal planning and cooking strategies and avoiding temptations.  Manali has agreed to follow-up with our clinic in 2 weeks. She was informed of the importance of frequent follow-up visits to maximize her success with intensive lifestyle modifications for her multiple health conditions.   Objective:   Blood pressure (!) 142/83, pulse 83, temperature 97.9 F (36.6 C), temperature source Oral, height 5\' 7"  (1.702 m), weight 247 lb (112 kg), SpO2 96 %. Body mass index is 38.69 kg/m.  General: Cooperative, alert, well developed, in no acute distress. HEENT: Conjunctivae and lids unremarkable. Cardiovascular: Regular rhythm.  Lungs: Normal work of breathing. Neurologic: No focal deficits.   Lab Results  Component Value Date   CREATININE 0.72 09/08/2019   BUN 11 09/08/2019   NA 141 09/08/2019   K 4.3 09/08/2019   CL 100 09/08/2019   CO2 24 09/08/2019   Lab Results  Component Value Date   ALT 29 09/08/2019   AST 22 09/08/2019   ALKPHOS 101 09/08/2019   BILITOT 0.3 09/08/2019   Lab Results  Component Value Date   HGBA1C 6.0 (H) 09/08/2019   HGBA1C  6.2 (H) 02/04/2019   HGBA1C 5.9 (H) 08/06/2018   HGBA1C 5.9 (H) 02/18/2018   HGBA1C 6.2 (H) 09/17/2017   Lab Results  Component Value Date   INSULIN 12.6 09/08/2019   INSULIN 23.8 02/04/2019   INSULIN 20.6 08/06/2018   INSULIN 15.2 02/18/2018   INSULIN 24.2 09/17/2017   Lab Results  Component Value Date   TSH 2.390 09/17/2017   Lab Results  Component Value Date   CHOL 195 09/08/2019   HDL 55 09/08/2019   LDLCALC 95 09/08/2019   TRIG 271 (H) 09/08/2019   Lab Results  Component Value Date   WBC 7.1 04/15/2019   HGB 13.4 04/15/2019   HCT 41.4 04/15/2019   MCV 83.8 04/15/2019   PLT 297 04/15/2019   No results found for: IRON, TIBC, FERRITIN  Attestation Statements:   Reviewed by clinician on day of visit: allergies, medications, problem list, medical history, surgical history, family history, social history, and previous encounter notes.  Migdalia Dk, am acting as Location manager for [name], PA-C   I have reviewed  the above documentation for accuracy and completeness, and I agree with the above. [name], PA-C

## 2020-03-07 NOTE — Progress Notes (Signed)
  Office: [phone]  /  Fax: [phone]    Date: March 21, 2020   Appointment Start Time: 8:29am Duration: 25 minutes Provider: Glennie Isle, Psy.D. Type of Session: Individual Therapy  Location of Patient: Home Location of Provider: Provider's Home Type of Contact: Telepsychological Visit via MyChart Video Visit  Session Content: Candace [name] is a 59 y.o. female presenting for a follow-up appointment to address the previously established treatment goal of increasing coping skills. Today's appointment was a telepsychological visit due to COVID-19. Candace [name] provided verbal consent for today's telepsychological appointment and she is aware she is responsible for securing confidentiality on her end of the session. Prior to proceeding with today's appointment, Candace [name]'s physical location at the time of this appointment was obtained as well a phone number she could be reached at in the event of technical difficulties. Candace [name] and this provider participated in today's telepsychological service.   This provider conducted a brief check-in. Candace [name] reported she has been busy with work and a recent trip with her daughter. She also shared she started yoga classes. Candace [name] also discussed an improvement in her eating habits. Additionally, she described making better choices and engaging in portion control. However, Candace [name] identified boredom tends to be a significant trigger for emotional eating. As such, psychoeducation regarding pleasurable activities, including its impact on emotional eating and overall well-being was provided. Candace [name] was provided with a handout with various options of pleasurable activities, and was encouraged to engage in one activity a day and additional activities as needed when triggered to emotionally eat. Candace [name] agreed. Candace [name] provided verbal consent during today's appointment for this provider to send a handout with pleasurable activities via e-mail. Candace [name] was receptive to today's  appointment as evidenced by openness to sharing, responsiveness to feedback, and willingness to engage in pleasurable activities to assist with coping.  Mental Status Examination:  Appearance: well groomed and appropriate hygiene  Behavior: appropriate to circumstances Mood: euthymic Affect: mood congruent Speech: normal in rate, volume, and tone Eye Contact: appropriate Psychomotor Activity: appropriate Gait: unable to assess Thought Process: linear, logical, and goal directed  Thought Content/Perception: no hallucinations, delusions, bizarre thinking or behavior reported or observed and no evidence of suicidal and homicidal ideation, plan, and intent Orientation: time, person, place, and purpose of appointment Memory/Concentration: memory, attention, language, and fund of knowledge intact  Insight/Judgment: good  Interventions:  Conducted a brief chart review Provided empathic reflections and validation Employed supportive psychotherapy interventions to facilitate reduced distress and to improve coping skills with identified stressors Psychoeducation provided regarding pleasurable activities   DSM-5 Diagnosis(es): 307.59 (F50.8) Other Specified Feeding or Eating Disorder, Emotional Eating Behaviors  Treatment Goal & Progress: During the initial appointment with this provider, the following treatment goal was established: increase coping skills. Candace [name] has demonstrated progress in her goal as evidenced by increased awareness of hunger patterns and increased awareness of triggers for emotional eating. Candace [name] also demonstrates willingness to engage in pleasurable activities.  Plan: The next appointment will be scheduled in two weeks, which will be via MyChart Video Visit. The next session will focus on working towards the established treatment goal.

## 2020-03-10 DIAGNOSIS — G4733 Obstructive sleep apnea (adult) (pediatric): Secondary | ICD-10-CM | POA: Diagnosis not present

## 2020-03-14 MED FILL — SAXENDA 18 MG/3 ML PEN: 18 | 30 days supply | Qty: 15 | Fill #0

## 2020-03-15 MED FILL — ROSUVASTATIN CALCIUM 10 MG: 10 | 90 days supply | Qty: 90 | Fill #1

## 2020-03-21 ENCOUNTER — Other Ambulatory Visit: Payer: Self-pay

## 2020-03-21 ENCOUNTER — Telehealth (INDEPENDENT_AMBULATORY_CARE_PROVIDER_SITE_OTHER): Payer: 59 | Admitting: Psychology

## 2020-03-21 ENCOUNTER — Encounter (INDEPENDENT_AMBULATORY_CARE_PROVIDER_SITE_OTHER): Payer: Self-pay | Admitting: Physician Assistant

## 2020-03-21 ENCOUNTER — Ambulatory Visit (INDEPENDENT_AMBULATORY_CARE_PROVIDER_SITE_OTHER): Payer: 59 | Admitting: Physician Assistant

## 2020-03-21 VITALS — BP 142/81 | HR 102 | Temp 98.0°F | Ht 67.0 in | Wt 246.0 lb

## 2020-03-21 DIAGNOSIS — F5089 Other specified eating disorder: Secondary | ICD-10-CM | POA: Diagnosis not present

## 2020-03-21 DIAGNOSIS — R7303 Prediabetes: Secondary | ICD-10-CM

## 2020-03-21 DIAGNOSIS — Z9189 Other specified personal risk factors, not elsewhere classified: Secondary | ICD-10-CM | POA: Diagnosis not present

## 2020-03-21 DIAGNOSIS — E7849 Other hyperlipidemia: Secondary | ICD-10-CM

## 2020-03-21 DIAGNOSIS — Z6838 Body mass index (BMI) 38.0-38.9, adult: Secondary | ICD-10-CM

## 2020-03-21 MED ORDER — BD PEN NEEDLE NANO 2ND GEN 32G X 4 MM MISC: 1.0000 | Freq: Every day | 0 refills | Status: DC

## 2020-03-21 MED ORDER — METFORMIN HCL 500 MG PO TABS: 500.0000 mg | ORAL_TABLET | Freq: Two times a day (BID) | ORAL | 0 refills | Status: DC

## 2020-03-21 MED FILL — METFORMIN HCL 500 MG TABS: 500 | 30 days supply | Qty: 60 | Fill #0

## 2020-03-21 NOTE — Progress Notes (Signed)
Office: [phone]  /  Fax: [phone]    Date: April 04, 2020   Appointment Start Time: 8:29am Duration: 26 minutes Provider: Glennie Isle, Psy.D. Type of Session: Individual Therapy  Location of Patient: Parked in car at [employer] of Provider: Provider's Home Type of Contact: Telepsychological Visit via MyChart Video Visit  Session Content: Candace [name] is a 59 y.o. female presenting for a follow-up appointment to address Candace previously established treatment goal of increasing coping skills. Today's appointment was a telepsychological visit due to COVID-19. Candace [name] provided verbal consent for today's telepsychological appointment and she is aware she is responsible for securing confidentiality on her end of Candace session. Prior to proceeding with today's appointment, Candace [name]'s physical location at Candace time of this appointment was obtained as well a phone number she could be reached at in Candace event of technical difficulties. Candace [name] and this provider participated in today's telepsychological service.   This provider conducted a brief check-in. Candace [name] reported, "It's been good." Reviewed pleasurable activities. She stated engaging in pleasurable activities has helped with boredom and subsequent emotional eating. Positive reinforcement was provided. She was encouraged to continue engaging in pleasurable activities; she agreed. Moreover, psychoeducation regarding mindfulness was provided. A handout was provided to Candace [name] with further information regarding mindfulness, including exercises. This provider also explained Candace benefit of mindfulness as it relates to emotional eating. [name] was encouraged to engage in Candace provided exercises between now and Candace next appointment with this provider. Candace [name] agreed. During today's appointment, Candace [name] was led through a mindfulness exercise involving her senses. Candace [name] provided verbal consent during today's appointment for this provider to send a handout  about mindfulness via e-mail. Notably, this provider discussed her upcoming maternity leave toward Candace end of November. Candace [name] acknowledged understanding given Candace uncertain nature of Candace circumstances, this provider may be out of Candace office sooner. This provider and Candace [name] discussed referral options and verbal consent was provided for this provider to send a list of referral options via e-mail. All questions/concerns were addressed. Candace [name] denied any concerns. Overall, Candace [name] was receptive to today's appointment as evidenced by openness to sharing, responsiveness to feedback, and willingness to engage in mindfulness exercises to assist with coping.  Mental Status Examination:  Appearance: well groomed and appropriate hygiene  Behavior: appropriate to circumstances Mood: euthymic Affect: mood congruent Speech: normal in rate, volume, and tone Eye Contact: appropriate Psychomotor Activity: appropriate Gait: unable to assess Thought Process: linear, logical, and goal directed  Thought Content/Perception: no hallucinations, delusions, bizarre thinking or behavior reported or observed and no evidence of suicidal and homicidal ideation, plan, and intent Orientation: time, person, place, and purpose of appointment Memory/Concentration: memory, attention, language, and fund of knowledge intact  Insight/Judgment: good  Interventions:  Conducted a brief chart review Provided empathic reflections and validation Reviewed content from Candace previous session Provided positive reinforcement Employed supportive psychotherapy interventions to facilitate reduced distress and to improve coping skills with identified stressors Psychoeducation provided regarding mindfulness Engaged patient in mindfulness exercise(s) Employed acceptance and commitment interventions to emphasize mindfulness and acceptance without struggle  DSM-5 Diagnosis(es): 307.59 (F50.8) Other Specified Feeding or Eating Disorder, Emotional  Eating Behaviors  Treatment Goal & Progress: During Candace initial appointment with this provider, Candace following treatment goal was established: increase coping skills. Candace [name] has demonstrated progress in her goal as evidenced by increased awareness of hunger patterns, increased awareness of triggers for emotional eating and reduction in emotional eating. Candace [name] also continues to demonstrate willingness to engage in learned skill(s).  Plan: Candace next appointment will be scheduled in two weeks, which will be via MyChart Video Visit. Candace next session will focus on working towards Candace established treatment goal.

## 2020-03-22 MED FILL — UNIFINE PENTIPS 32GX5/32: 32G X 4 MM | 90 days supply | Qty: 100 | Fill #0

## 2020-03-22 NOTE — Progress Notes (Signed)
Chief Complaint:   OBESITY Candace [name] is here to discuss her progress with her obesity treatment plan along with follow-up of her obesity related diagnoses. Candace [name] is on the Category 4 Plan and states she is following her eating plan approximately 50% of the time. Candace [name] states she is doing yoga/walking 20 minutes 6 times per week.  Today's visit was #: 36 Starting weight: 249 lbs Starting date: 09/17/2017 Today's weight: 246 lbs Today's date: 03/21/2020 Total lbs lost to date: 3 Total lbs lost since last in-office visit: 1  Interim History: Candace [name] joined a yoga class, which she is doing twice weekly. Her Candace [name] was just approved so she has not yet started. She is trying to figure out what is going to make her more consistent following the plan and is considering making cards or a checklist.  Subjective:   Prediabetes. Candace [name] has a diagnosis of prediabetes based on her elevated HgA1c and was informed this puts her at greater risk of developing diabetes. She continues to work on diet and exercise to decrease her risk of diabetes. She denies nausea or hypoglycemia. Candace [name] is on metformin, which she is tolerating well. She is exercising regularly.  Lab Results  Component Value Date   HGBA1C 6.0 (H) 09/08/2019   Lab Results  Component Value Date   INSULIN 12.6 09/08/2019   INSULIN 23.8 02/04/2019   INSULIN 20.6 08/06/2018   INSULIN 15.2 02/18/2018   INSULIN 24.2 09/17/2017   Other hyperlipidemia. Candace [name] is on Crestor. She is exercising regularly.  Lab Results  Component Value Date   CHOL 195 09/08/2019   HDL 55 09/08/2019   LDLCALC 95 09/08/2019   TRIG 271 (H) 09/08/2019   Lab Results  Component Value Date   ALT 29 09/08/2019   AST 22 09/08/2019   ALKPHOS 101 09/08/2019   BILITOT 0.3 09/08/2019   The 10-year ASCVD risk score Candace [name] DC Jr., et al., 2013) is: 4.3%   Values used to calculate the score:     Age: 59 years     Sex: Female     Is Non-Hispanic African  American: No     Diabetic: No     Tobacco smoker: No     Systolic Blood Pressure: 222 mmHg     Is BP treated: Yes     HDL Cholesterol: 55 mg/dL     Total Cholesterol: 195 mg/dL  At increased risk of exposure to COVID-19 virus. The patient is at higher risk of COVID-19 infection due to higher infection rates locally.  Assessment/Plan:   Prediabetes. Shaquana will continue to work on weight loss, exercise, and decreasing simple carbohydrates to help decrease the risk of diabetes. Refill was given for metFORMIN (GLUCOPHAGE) 500 MG tablet #60 with 0 refills. Prescription was given for Insulin Pen Needle (BD PEN NEEDLE NANO 2ND GEN) 32G X 4 MM MISC #100 with 0 refills.  Other hyperlipidemia. Cardiovascular risk and specific lipid/LDL goals reviewed.  We discussed several lifestyle modifications today and Candace [name] will continue to work on diet, exercise and weight loss efforts. Orders and follow up as documented in patient record. She will continue her medication as directed.   Counseling Intensive lifestyle modifications are the first line treatment for this issue. . Dietary changes: Increase soluble fiber. Decrease simple carbohydrates. . Exercise changes: Moderate to vigorous-intensity aerobic activity 150 minutes per week if tolerated. . Lipid-lowering medications: see documented in medical record.  At increased risk of exposure to COVID-19 virus. Candace [name] was given approximately  15 minutes of COVID prevention counseling today. Counseling  COVID-19 is a respiratory infection that is caused by a virus. It can cause serious infections, such as pneumonia, acute respiratory distress syndrome, acute respiratory failure, or sepsis.  You are more likely to develop a serious illness if you are 1 years of age or older, have a weak immune system, live in a nursing home, have chronic disease, or have obesity.  Get vaccinated as soon as they are available to you.  For our most current information, please  visit [email or web address].  Wash your hands often with soap and water for 20 seconds. If soap and water are not available, use alcohol-based hand sanitizer.  Wear a face mask. Make sure your mask covers your nose and mouth.  Maintain at least 6 feet distance from others when in public.  Get help right away if  You have trouble breathing, chest pain, confusion, or other concerning symptoms.  Repetitive spaced learning was employed today to elicit superior memory formation and behavioral change.  Class 2 severe obesity with serious comorbidity and body mass index (BMI) of 38.0 to 38.9 in adult, unspecified obesity type (Candace [name]).   Candace [name] is currently in the action stage of change. As such, her goal is to continue with weight loss efforts. She has agreed to the Category 4 Plan.   Exercise goals: For substantial health benefits, adults should do at least 150 minutes (2 hours and 30 minutes) a week of moderate-intensity, or 75 minutes (1 hour and 15 minutes) a week of vigorous-intensity aerobic physical activity, or an equivalent combination of moderate- and vigorous-intensity aerobic activity. Aerobic activity should be performed in episodes of at least 10 minutes, and preferably, it should be spread throughout the week.  Behavioral modification strategies: meal planning and cooking strategies and keeping healthy foods in the home.  Candace [name] has agreed to follow-up with our clinic in 2 weeks. She was informed of the importance of frequent follow-up visits to maximize her success with intensive lifestyle modifications for her multiple health conditions.   Objective:   Blood pressure (!) 142/81, pulse (!) 102, temperature 98 F (36.7 C), height 5\' 7"  (1.702 m), weight 246 lb (111.6 kg), SpO2 97 %. Body mass index is 38.53 kg/m.  General: Cooperative, alert, well developed, in no acute distress. HEENT: Conjunctivae and lids unremarkable. Cardiovascular: Regular rhythm.  Lungs: Normal  work of breathing. Neurologic: No focal deficits.   Lab Results  Component Value Date   CREATININE 0.72 09/08/2019   BUN 11 09/08/2019   NA 141 09/08/2019   K 4.3 09/08/2019   CL 100 09/08/2019   CO2 24 09/08/2019   Lab Results  Component Value Date   ALT 29 09/08/2019   AST 22 09/08/2019   ALKPHOS 101 09/08/2019   BILITOT 0.3 09/08/2019   Lab Results  Component Value Date   HGBA1C 6.0 (H) 09/08/2019   HGBA1C 6.2 (H) 02/04/2019   HGBA1C 5.9 (H) 08/06/2018   HGBA1C 5.9 (H) 02/18/2018   HGBA1C 6.2 (H) 09/17/2017   Lab Results  Component Value Date   INSULIN 12.6 09/08/2019   INSULIN 23.8 02/04/2019   INSULIN 20.6 08/06/2018   INSULIN 15.2 02/18/2018   INSULIN 24.2 09/17/2017   Lab Results  Component Value Date   TSH 2.390 09/17/2017   Lab Results  Component Value Date   CHOL 195 09/08/2019   HDL 55 09/08/2019   LDLCALC 95 09/08/2019   TRIG 271 (H) 09/08/2019   Lab Results  Component Value  Date   WBC 7.1 04/15/2019   HGB 13.4 04/15/2019   HCT 41.4 04/15/2019   MCV 83.8 04/15/2019   PLT 297 04/15/2019   No results found for: IRON, TIBC, FERRITIN  Attestation Statements:   Reviewed by clinician on day of visit: allergies, medications, problem list, medical history, surgical history, family history, social history, and previous encounter notes.  Candace [name], am acting as transcriptionist for Candace Potash, PA-C   I have reviewed the above documentation for accuracy and completeness, and I agree with the above. Candace Potash, PA-C

## 2020-04-04 ENCOUNTER — Encounter (INDEPENDENT_AMBULATORY_CARE_PROVIDER_SITE_OTHER): Payer: Self-pay | Admitting: Physician Assistant

## 2020-04-04 ENCOUNTER — Telehealth (INDEPENDENT_AMBULATORY_CARE_PROVIDER_SITE_OTHER): Payer: 59 | Admitting: Psychology

## 2020-04-04 ENCOUNTER — Other Ambulatory Visit: Payer: Self-pay

## 2020-04-04 ENCOUNTER — Ambulatory Visit (INDEPENDENT_AMBULATORY_CARE_PROVIDER_SITE_OTHER): Payer: 59 | Admitting: Physician Assistant

## 2020-04-04 ENCOUNTER — Other Ambulatory Visit (INDEPENDENT_AMBULATORY_CARE_PROVIDER_SITE_OTHER): Payer: Self-pay | Admitting: Physician Assistant

## 2020-04-04 VITALS — BP 133/84 | HR 94 | Temp 98.3°F | Ht 67.0 in | Wt 247.0 lb

## 2020-04-04 DIAGNOSIS — E7849 Other hyperlipidemia: Secondary | ICD-10-CM | POA: Diagnosis not present

## 2020-04-04 DIAGNOSIS — R7303 Prediabetes: Secondary | ICD-10-CM | POA: Diagnosis not present

## 2020-04-04 DIAGNOSIS — E559 Vitamin D deficiency, unspecified: Secondary | ICD-10-CM

## 2020-04-04 DIAGNOSIS — Z6838 Body mass index (BMI) 38.0-38.9, adult: Secondary | ICD-10-CM

## 2020-04-04 DIAGNOSIS — F5089 Other specified eating disorder: Secondary | ICD-10-CM | POA: Diagnosis not present

## 2020-04-04 DIAGNOSIS — Z9189 Other specified personal risk factors, not elsewhere classified: Secondary | ICD-10-CM | POA: Diagnosis not present

## 2020-04-04 MED ORDER — METFORMIN HCL 500 MG PO TABS: 500.0000 mg | ORAL_TABLET | Freq: Two times a day (BID) | ORAL | 0 refills | Status: DC

## 2020-04-04 NOTE — Progress Notes (Signed)
Chief Complaint:   OBESITY Candace [name] is here to discuss her progress with her obesity treatment plan along with follow-up of her obesity related diagnoses. [name] is on the Category 4 Plan and states she is following her eating plan approximately 70% of the time. [name] states she is doing yoga/walking 60 minutes 2 times per week.  Today's visit was #: 66 Starting weight: 249 lbs Starting date: 09/17/2017 Today's weight: 247 lbs Today's date: 04/04/2020 Total lbs lost to date: 2 Total lbs lost since last in-office visit: 0  Interim History: [name] states that the Victoza has decreased her cravings and she feels hungrier. She enjoys baking and has been making muffins. She has also indulged in pumpkin pie.  Subjective:   Prediabetes. [name] has a diagnosis of prediabetes based on her elevated HgA1c and was informed this puts her at greater risk of developing diabetes. She continues to work on diet and exercise to decrease her risk of diabetes. She denies nausea or hypoglycemia. [name] is on metformin and Victoza. She continues to report polyphagia.  Lab Results  Component Value Date   HGBA1C 6.0 (H) 09/08/2019   Lab Results  Component Value Date   INSULIN 12.6 09/08/2019   INSULIN 23.8 02/04/2019   INSULIN 20.6 08/06/2018   INSULIN 15.2 02/18/2018   INSULIN 24.2 09/17/2017   Vitamin D deficiency. [name] is on Vitamin D 5,000 units weekly. She is due for labs.   Ref. Range 09/08/2019 13:18  Vitamin D, 25-Hydroxy Latest Ref Range: 30.0 - 100.0 ng/mL 42.5   Other hyperlipidemia. [name] is on Crestor and denies chest pain or myalgias. She is due for labs.  Lab Results  Component Value Date   CHOL 195 09/08/2019   HDL 55 09/08/2019   LDLCALC 95 09/08/2019   TRIG 271 (H) 09/08/2019   Lab Results  Component Value Date   ALT 29 09/08/2019   AST 22 09/08/2019   ALKPHOS 101 09/08/2019   BILITOT 0.3 09/08/2019   The 10-year ASCVD risk score [name], et al., 2013)  is: 3.8%   Values used to calculate the score:     Age: 59 years     Sex: Female     Is Non-Hispanic African American: No     Diabetic: No     Tobacco smoker: No     Systolic Blood Pressure: 678 mmHg     Is BP treated: Yes     HDL Cholesterol: 55 mg/dL     Total Cholesterol: 195 mg/dL  At risk for heart disease. [name] is at a higher than average risk for cardiovascular disease due to obesity.   Assessment/Plan:   Prediabetes. [name] will continue to work on weight loss, exercise, and decreasing simple carbohydrates to help decrease the risk of diabetes. Refill was given for metFORMIN (GLUCOPHAGE) 500 MG tablet #60 with 0 refills. She will increase Victoza to 1.2 mg daily.  Vitamin D deficiency. Low Vitamin D level contributes to fatigue and are associated with obesity, breast, and colon cancer. She agrees to continue to take Vitamin D as directed and VITAMIN D 25 Hydroxy (Vit-D Deficiency, Fractures) level will be checked today.   Other hyperlipidemia.  Cardiovascular risk and specific lipid/LDL goals reviewed.  We discussed several lifestyle modifications today and [name] will continue to work on diet, exercise and weight loss efforts. Orders and follow up as documented in patient record. She will continue her medication as directed. Labs will be checked today.  Counseling Intensive  lifestyle modifications are the first line treatment for this issue.  Dietary changes: Increase soluble fiber. Decrease simple carbohydrates.  Exercise changes: Moderate to vigorous-intensity aerobic activity 150 minutes per week if tolerated.  Lipid-lowering medications: see documented in medical record.  At risk for heart disease. [name] was given approximately 15 minutes of coronary artery disease prevention counseling today. She is 59 y.o. female and has risk factors for heart disease including obesity. We discussed intensive lifestyle modifications today with an emphasis on specific weight loss  instructions and strategies.   Repetitive spaced learning was employed today to elicit superior memory formation and behavioral change.  Class 2 severe obesity with serious comorbidity and body mass index (BMI) of 38.0 to 38.9 in adult, unspecified obesity type ([place]).   [name] is currently in the action stage of change. As such, her goal is to continue with weight loss efforts. She has agreed to the Category 4 Plan.   Exercise goals: For substantial health benefits, adults should do at least 150 minutes (2 hours and 30 minutes) a week of moderate-intensity, or 75 minutes (1 hour and 15 minutes) a week of vigorous-intensity aerobic physical activity, or an equivalent combination of moderate- and vigorous-intensity aerobic activity. Aerobic activity should be performed in episodes of at least 10 minutes, and preferably, it should be spread throughout the week.  Behavioral modification strategies: meal planning and cooking strategies and keeping healthy foods in the home.  [name] has agreed to follow-up with our clinic in 2 weeks. She was informed of the importance of frequent follow-up visits to maximize her success with intensive lifestyle modifications for her multiple health conditions.   [name] was informed we would discuss her lab results at her next visit unless there is a critical issue that needs to be addressed sooner. [name] agreed to keep her next visit at the agreed upon time to discuss these results.  Objective:   Blood pressure 133/84, pulse 94, temperature 98.3 F (36.8 C), height 5\' 7"  (1.702 m), weight 247 lb (112 kg), SpO2 95 %. Body mass index is 38.69 kg/m.  General: Cooperative, alert, well developed, in no acute distress. HEENT: Conjunctivae and lids unremarkable. Cardiovascular: Regular rhythm.  Lungs: Normal work of breathing. Neurologic: No focal deficits.   Lab Results  Component Value Date   CREATININE 0.72 09/08/2019   BUN 11 09/08/2019   NA 141 09/08/2019     K 4.3 09/08/2019   CL 100 09/08/2019   CO2 24 09/08/2019   Lab Results  Component Value Date   ALT 29 09/08/2019   AST 22 09/08/2019   ALKPHOS 101 09/08/2019   BILITOT 0.3 09/08/2019   Lab Results  Component Value Date   HGBA1C 6.0 (H) 09/08/2019   HGBA1C 6.2 (H) 02/04/2019   HGBA1C 5.9 (H) 08/06/2018   HGBA1C 5.9 (H) 02/18/2018   HGBA1C 6.2 (H) 09/17/2017   Lab Results  Component Value Date   INSULIN 12.6 09/08/2019   INSULIN 23.8 02/04/2019   INSULIN 20.6 08/06/2018   INSULIN 15.2 02/18/2018   INSULIN 24.2 09/17/2017   Lab Results  Component Value Date   TSH 2.390 09/17/2017   Lab Results  Component Value Date   CHOL 195 09/08/2019   HDL 55 09/08/2019   LDLCALC 95 09/08/2019   TRIG 271 (H) 09/08/2019   Lab Results  Component Value Date   WBC 7.1 04/15/2019   HGB 13.4 04/15/2019   HCT 41.4 04/15/2019   MCV 83.8 04/15/2019   PLT 297 04/15/2019  No results found for: IRON, TIBC, FERRITIN  Attestation Statements:   Reviewed by clinician on day of visit: allergies, medications, problem list, medical history, surgical history, family history, social history, and previous encounter notes.  IMichaelene Song, am acting as transcriptionist for [name], PA-C   I have reviewed the above documentation for accuracy and completeness, and I agree with the above. [name], PA-C

## 2020-04-04 NOTE — Progress Notes (Signed)
  Office: [phone]  /  Fax: [phone]    Date: April 18, 2020   Appointment Start Time: 8:30am Duration: 26 minutes Provider: Glennie Isle, Psy.D. Type of Session: Individual Therapy  Location of Patient: Home Location of Provider: Provider's Home Type of Contact: Telepsychological Visit via MyChart Video Visit  Session Content: Candace [name] is a 59 y.o. female presenting for a follow-up appointment to address the previously established treatment goal of increasing coping skills. Today's appointment was a telepsychological visit due to COVID-19. Candace [name] provided verbal consent for today's telepsychological appointment and she is aware she is responsible for securing confidentiality on her end of the session. Prior to proceeding with today's appointment, [name]'s physical location at the time of this appointment was obtained as well a phone number she could be reached at in the event of technical difficulties. Candace [name] and this provider participated in today's telepsychological service.   This provider conducted a brief check-in. Candace [name] shared she has been busy, noting she is "trying to be mindful when eating." This was further explored. It was reflected she is going long periods between lunch and dinner, which likely contributes to increased hunger in the evenings. Consequences of not eating regularly were discussed (e.g., increased irritability, difficulty concentrating, ravenous hunger resulting in poor choices). She agreed to take a snack to work for late afternoons/evenings to avoid the aforementioned.  Session focused further on mindfulness to assist with coping. She described increased awareness has helped reduced emotional eating. Positive reinforcement was provided. This provider also discussed the utilization of YouTube for mindfulness exercises (e.g., exercises by Merri Ray). Furthermore, termination planning was discussed. Candace [name] was receptive to a follow-up appointment in 2-3 weeks  and an additional follow-up/termination appointment in approximately 2-3 weeks after that. Candace [name] was receptive to today's appointment as evidenced by openness to sharing, responsiveness to feedback, and willingness to continue engaging in mindfulness exercises.  Mental Status Examination:  Appearance: well groomed and appropriate hygiene  Behavior: appropriate to circumstances Mood: euthymic Affect: mood congruent Speech: normal in rate, volume, and tone Eye Contact: appropriate Psychomotor Activity: appropriate Gait: unable to assess Thought Process: linear, logical, and goal directed  Thought Content/Perception: no hallucinations, delusions, bizarre thinking or behavior reported or observed and no evidence of suicidal and homicidal ideation, plan, and intent Orientation: time, person, place, and purpose of appointment Memory/Concentration: memory, attention, language, and fund of knowledge intact  Insight/Judgment: good  Interventions:  Conducted a brief chart review Provided empathic reflections and validation Provided positive reinforcement Employed supportive psychotherapy interventions to facilitate reduced distress and to improve coping skills with identified stressors Psychoeducation provided regarding mindfulness Discussed termination planning  DSM-5 Diagnosis(es): 307.59 (F50.8) Other Specified Feeding or Eating Disorder, Emotional Eating Behaviors  Treatment Goal & Progress: During the initial appointment with this provider, the following treatment goal was established: increase coping skills. Candace [name] has demonstrated progress in her goal as evidenced by increased awareness of hunger patterns, increased awareness of triggers for emotional eating and reduction in emotional eating. Candace [name] also continues to demonstrate willingness to engage in learned skill(s).  Plan: The next appointment will be scheduled in 2-3 weeks, which will be via MyChart Video Visit. The next session will  focus on working towards the established treatment goal.

## 2020-04-05 ENCOUNTER — Other Ambulatory Visit: Payer: Self-pay | Admitting: Oncology

## 2020-04-05 DIAGNOSIS — Z9889 Other specified postprocedural states: Secondary | ICD-10-CM

## 2020-04-05 LAB — COMPREHENSIVE METABOLIC PANEL
ALT: 28 IU/L (ref 0–32)
AST: 21 IU/L (ref 0–40)
Albumin/Globulin Ratio: 2 (ref 1.2–2.2)
Albumin: 4.5 g/dL (ref 3.8–4.9)
Alkaline Phosphatase: 86 IU/L (ref 44–121)
BUN/Creatinine Ratio: 21 (ref 9–23)
BUN: 16 mg/dL (ref 6–24)
Bilirubin Total: 0.3 mg/dL (ref 0.0–1.2)
CO2: 27 mmol/L (ref 20–29)
Calcium: 9.8 mg/dL (ref 8.7–10.2)
Chloride: 100 mmol/L (ref 96–106)
Creatinine, Ser: 0.75 mg/dL (ref 0.57–1.00)
GFR calc Af Amer: 102 mL/min/{1.73_m2} (ref >59)
GFR calc non Af Amer: 88 mL/min/{1.73_m2} (ref >59)
Globulin, Total: 2.2 g/dL (ref 1.5–4.5)
Glucose: 113 mg/dL — ABNORMAL HIGH (ref 65–99)
Potassium: 4.2 mmol/L (ref 3.5–5.2)
Sodium: 140 mmol/L (ref 134–144)
Total Protein: 6.7 g/dL (ref 6.0–8.5)

## 2020-04-05 LAB — LIPID PANEL
Chol/HDL Ratio: 3.9 ratio (ref 0.0–4.4)
Cholesterol, Total: 187 mg/dL (ref 100–199)
HDL: 48 mg/dL (ref >39)
LDL Chol Calc (NIH): 95 mg/dL (ref 0–99)
Triglycerides: 262 mg/dL — ABNORMAL HIGH (ref 0–149)
VLDL Cholesterol Cal: 44 mg/dL — ABNORMAL HIGH (ref 5–40)

## 2020-04-05 LAB — VITAMIN D 25 HYDROXY (VIT D DEFICIENCY, FRACTURES): Vit D, 25-Hydroxy: 41.2 ng/mL (ref 30.0–100.0)

## 2020-04-05 LAB — HEMOGLOBIN A1C
Est. average glucose Bld gHb Est-mCnc: 131 mg/dL
Hgb A1c MFr Bld: 6.2 % — ABNORMAL HIGH (ref 4.8–5.6)

## 2020-04-05 LAB — INSULIN, RANDOM: INSULIN: 22.9 u[IU]/mL (ref 2.6–24.9)

## 2020-04-05 MED FILL — FLUoxetine HCL 10 MG TABS: 10 | 90 days supply | Qty: 90 | Fill #1

## 2020-04-05 MED FILL — buPROPion HCL ER (XL) 300 M: 300 | 90 days supply | Qty: 90 | Fill #1

## 2020-04-12 ENCOUNTER — Ambulatory Visit: Payer: 59 | Admitting: Orthopaedic Surgery

## 2020-04-12 ENCOUNTER — Encounter: Payer: Self-pay | Admitting: Orthopaedic Surgery

## 2020-04-12 DIAGNOSIS — M65321 Trigger finger, right index finger: Secondary | ICD-10-CM | POA: Diagnosis not present

## 2020-04-12 DIAGNOSIS — M65322 Trigger finger, left index finger: Secondary | ICD-10-CM

## 2020-04-12 DIAGNOSIS — M65331 Trigger finger, right middle finger: Secondary | ICD-10-CM

## 2020-04-12 DIAGNOSIS — M65332 Trigger finger, left middle finger: Secondary | ICD-10-CM | POA: Diagnosis not present

## 2020-04-12 MED ORDER — METHYLPREDNISOLONE ACETATE 40 MG/ML IJ SUSP
20.0000 mg | INTRAMUSCULAR | Status: AC | PRN
  Administered 2020-04-12: 20 mg

## 2020-04-12 MED ORDER — LIDOCAINE HCL 1 % IJ SOLN
0.5000 mL | INTRAMUSCULAR | Status: AC | PRN
  Administered 2020-04-12: .5 mL

## 2020-04-12 NOTE — Progress Notes (Signed)
Office Visit Note   Patient: Candace [name]           Date of Birth: [birth year]           MRN: [id number] Visit Date: 04/12/2020              Requested by: [name], MD [address] [place],  [place] [address] PCP: [name], MD   Assessment & Plan: Visit Diagnoses:  1. Trigger index finger of left hand   2. Trigger finger, left middle finger   3. Trigger index finger of right hand   4. Trigger finger, right middle finger     Plan: I did place steroid injections in both hands of the index and middle finger A1 pulleys which he tolerated well.  All questions and concerns were answered addressed.  Follow-up as always as needed.  If she gets the point where she would like to pursue surgery she will let us know.  Follow-Up Instructions: Return if symptoms worsen or fail to improve.   Orders:  Orders Placed This Encounter  Procedures  . Hand/UE Inj  . Hand/UE Inj  . Hand/UE Inj  . Hand/UE Inj   No orders of the defined types were placed in this encounter.     Procedures: Hand/UE Inj: R index A1 for trigger finger on 04/12/2020 11:01 AM Medications: 0.5 mL lidocaine 1 %; 20 mg methylPREDNISolone acetate 40 MG/ML  Hand/UE Inj: L index A1 for trigger finger on 04/12/2020 11:02 AM Medications: 0.5 mL lidocaine 1 %; 20 mg methylPREDNISolone acetate 40 MG/ML  Hand/UE Inj: R long A1 for trigger finger on 04/12/2020 11:02 AM Medications: 0.5 mL lidocaine 1 %; 20 mg methylPREDNISolone acetate 40 MG/ML  Hand/UE Inj: L long A1 for trigger finger on 04/12/2020 11:02 AM Medications: 0.5 mL lidocaine 1 %; 20 mg methylPREDNISolone acetate 40 MG/ML      Clinical Data: No additional findings.   Subjective: Chief Complaint  Patient presents with  . Left Index Finger - Pain  . Right Index Finger - Pain  . Right Middle Finger - Pain  . Left Middle Finger - Pain  Candace [name] is well-known to me.  She has been dealing with trigger finger of her left index and  middle finger as well as her right index and middle finger for some time now.  She had injections over the years and that is helped.  She has been having active triggering and pain over the A1 pulley of all of these in both hands.  She would like to have injections today.  She is had no other acute change in medical status.  She has tolerated injections well in the past.  She actually has had A1 pulley releases of her thumbs on both sides remotely.  HPI  Review of Systems There is currently listed no headache, chest pain, shortness of breath, fever, chills, nausea, vomiting  Objective: Vital Signs: There were no vitals taken for this visit.  Physical Exam She is alert and orient x3 and in no acute distress Ortho Exam Examination of both hands shows pain and triggering over the A1 pulley of the index and middle fingers. Specialty Comments:  No specialty comments available.  Imaging: No results found.   PMFS History: Patient Active Problem List   Diagnosis Date Noted  . Genetic testing 05/13/2016  . Malignant neoplasm of upper-outer quadrant of right breast in female, estrogen receptor positive ([facility]) 05/02/2016  . Family history of breast cancer in  sister 04/23/2016  . Depression   . Endometrial hyperplasia   . Oligomenorrhea   . Insomnia   . H/O hemorrhoids   . Epidermal cyst   . High risk HPV infection   . Carpal tunnel syndrome on left 05/28/2011   Past Medical History:  Diagnosis Date  . Abnormal Pap smear   . Alcohol abuse   . Anovulation   . Anxiety   . Breast cancer ([facility]) 04/05/2016   right breast  . Cancer ([facility]) 2017   right breast cancer  . Complication of anesthesia    slow to wake up  . Depression   . Endometrial hyperplasia   . Endometrial polyp    h/o  . Epidermal cyst    h/o  . Gallbladder problem   . GERD (gastroesophageal reflux disease)   . H/O hemorrhoids   . H/O menorrhagia   . High risk HPV infection   . Hyperlipidemia   . Insomnia   .  Obesity   . Oligomenorrhea   . Prediabetes   . Restless legs syndrome   . Seasonal allergies   . Vitamin D deficiency     Family History  Problem Relation Age of Onset  . Heart disease Mother   . Hypertension Mother   . Stroke Mother   . Dementia Mother        vascular dementia  . Hyperlipidemia Mother   . Depression Mother   . Anxiety disorder Mother   . Alcohol abuse Mother   . Obesity Mother   . Pulmonary fibrosis Father        d. 70  . Depression Father   . Anxiety disorder Father   . Alcohol abuse Father   . Heart attack Maternal Grandmother        d. [phone]  . Heart disease Maternal Grandmother   . Heart disease Maternal Grandfather        d. 60-63  . Leukemia Paternal Grandmother        d. late 72s  . Breast cancer Sister 64       IDC s/p mastectomy and chemo    Past Surgical History:  Procedure Laterality Date  . BREAST LUMPECTOMY Right 05/2016   radiation  . BREAST LUMPECTOMY WITH RADIOACTIVE SEED AND SENTINEL LYMPH NODE BIOPSY Right 05/28/2016   Procedure: RADIOACTIVE SEED GUIDED RIGHT BREAST LUMPECTOMY WITH  RIGHT AXILLARY SENTINEL LYMPH NODE BIOPSY;  Surgeon: [name], MD;  Location: [facility];  Service: General;  Laterality: Right;  . CARPAL TUNNEL RELEASE  03/26/2011   right hand  . CARPAL TUNNEL RELEASE  05/28/2011   Procedure: CARPAL TUNNEL RELEASE;  Surgeon: [name];  Location: [facility];  Service: Orthopedics;  Laterality: Left;  . CHOLECYSTECTOMY N/A 01/24/2013   Procedure: LAPAROSCOPIC CHOLECYSTECTOMY WITH INTRAOPERATIVE CHOLANGIOGRAM;  Surgeon: [name], MD;  Location: [facility];  Service: General;  Laterality: N/A;  . COLONOSCOPY    . HYSTEROSCOPY    . POLYPECTOMY    . TONSILLECTOMY  1967   as child  . trigger fingers  8 yrs. ago   both done months apart  . uterine ablation  06/04/2010  . uterine ablation     Social History   Occupational History  . Not on file  Tobacco Use  . Smoking status: Never Smoker  .  Smokeless tobacco: Never Used  Substance and Sexual Activity  . Alcohol use: No    Comment: none since 1996  . Drug use: No  . Sexual activity:  Not Currently    Birth control/protection: None

## 2020-04-14 ENCOUNTER — Other Ambulatory Visit: Payer: Self-pay | Admitting: *Deleted

## 2020-04-14 DIAGNOSIS — H5213 Myopia, bilateral: Secondary | ICD-10-CM | POA: Diagnosis not present

## 2020-04-14 DIAGNOSIS — Z170 Estrogen receptor positive status [ER+]: Secondary | ICD-10-CM

## 2020-04-14 DIAGNOSIS — C50411 Malignant neoplasm of upper-outer quadrant of right female breast: Secondary | ICD-10-CM

## 2020-04-16 NOTE — Progress Notes (Signed)
Candace [name]  [phone] [phone]     ID: Candace [name] DOB: 01-21-1961  MR#: [id number]  JYN#:[id number]  Patient Care Team: [name], MD as PCP - General (Family Medicine) Kruti Horacek, Virgie Dad, MD as Consulting Physician (Oncology) [name], MD as Consulting Physician (General Surgery) Haygood, Seymour Bars, MD (Inactive) as Consulting Physician (Obstetrics and Gynecology) Ronald Lobo, MD as Consulting Physician (Gastroenterology) Danella Sensing, MD as Consulting Physician (Dermatology) Gardenia Phlegm, NP as Nurse Practitioner (Hematology and Oncology) OTHER MD:   CHIEF COMPLAINT: Estrogen receptor positive breast cancer   CURRENT TREATMENT: Anastrozole   INTERVAL HISTORY: Candace [name] was scheduled today for follow-up of her estrogen receptor positive breast cancer, however she did not show  Candace [name]'s last bone density screening on 04/08/2019, showed a T-score of 0.3, which is considered Normal.    She is scheduled for annual diagnostic mammogram on 04/28/2020.   REVIEW OF SYSTEMS: [place]    BREAST CANCER HISTORY: From the original intake note:  Candace [name] had routine screening mammography with tomography at the [facility] 03/29/2016. On 04/04/2016 she underwent right diagnostic mammography and tomography with right breast ultrasonography. The breast density was category B. In the upper outer quadrant of the breast there was a mass measuring 0.7 cm, with irregular margins. By ultrasonography this was located at the 10:00 position 9 cm from the nipple measuring 0.6 cm. Right axillary ultrasound was negative.  Biopsy of the right breast mass in question 04/05/2016 showed ([id number]) and invasive lobular carcinoma, grade 2 or 3, E-cadherin negative, estrogen receptor 90% positive, progesterone receptor 5% positive, both with strong staining intensity, with an MIB-1 of 20%, and no HER-2 amplification, the signals ratio  being 0.82 and the number per cell 1.60.  Bilateral breast MRI was obtained 04/23/2016. This confirmed a 0.6 cm nodule in the upper-outer quadrant of the right breast with no additional sites of concern in either breast and no abnormal appearing lymph nodes.  Her subsequent history is as detailed below.   PAST MEDICAL HISTORY: Past Medical History:  Diagnosis Date  . Abnormal Pap smear   . Alcohol abuse   . Anovulation   . Anxiety   . Breast cancer ([facility]) 04/05/2016   right breast  . Cancer ([facility]) 2017   right breast cancer  . Complication of anesthesia    slow to wake up  . Depression   . Endometrial hyperplasia   . Endometrial polyp    h/o  . Epidermal cyst    h/o  . Gallbladder problem   . GERD (gastroesophageal reflux disease)   . H/O hemorrhoids   . H/O menorrhagia   . High risk HPV infection   . Hyperlipidemia   . Insomnia   . Obesity   . Oligomenorrhea   . Prediabetes   . Restless legs syndrome   . Seasonal allergies   . Vitamin D deficiency     PAST SURGICAL HISTORY: Past Surgical History:  Procedure Laterality Date  . BREAST LUMPECTOMY Right 05/2016   radiation  . BREAST LUMPECTOMY WITH RADIOACTIVE SEED AND SENTINEL LYMPH NODE BIOPSY Right 05/28/2016   Procedure: RADIOACTIVE SEED GUIDED RIGHT BREAST LUMPECTOMY WITH  RIGHT AXILLARY SENTINEL LYMPH NODE BIOPSY;  Surgeon: [name], MD;  Location: [facility];  Service: General;  Laterality: Right;  . CARPAL TUNNEL RELEASE  03/26/2011   right hand  . CARPAL TUNNEL RELEASE  05/28/2011   Procedure: CARPAL TUNNEL RELEASE;  Surgeon: [name];  [facility];  Service: Orthopedics;  Laterality: Left;  . CHOLECYSTECTOMY N/A 01/24/2013   Procedure: LAPAROSCOPIC CHOLECYSTECTOMY WITH INTRAOPERATIVE CHOLANGIOGRAM;  Surgeon: [name], MD;  Location: [facility];  Service: General;  Laterality: N/A;  . COLONOSCOPY    . HYSTEROSCOPY    . POLYPECTOMY    . TONSILLECTOMY  1967   as child  . trigger  fingers  8 yrs. ago   both done months apart  . uterine ablation  06/04/2010  . uterine ablation      FAMILY HISTORY Family History  Problem Relation Age of Onset  . Heart disease Mother   . Hypertension Mother   . Stroke Mother   . Dementia Mother        vascular dementia  . Hyperlipidemia Mother   . Depression Mother   . Anxiety disorder Mother   . Alcohol abuse Mother   . Obesity Mother   . Pulmonary fibrosis Father        d. 34  . Depression Father   . Anxiety disorder Father   . Alcohol abuse Father   . Heart attack Maternal Grandmother        d. [phone]  . Heart disease Maternal Grandmother   . Heart disease Maternal Grandfather        d. 60-63  . Leukemia Paternal Grandmother        d. late 57s  . Breast cancer Sister 30       IDC s/p mastectomy and chemo  Both of the patient's parents were only children. Her father died at age 35 from idiopathic pulmonary fibrosis. The patient's mother died at the age of 34 in the setting of dementia. The patient has one brother and one sister. Her sister was diagnosed at age 36 with breast cancer (a T1b invasive ductal carcinoma just behind the nipple, requiring mastectomy. Her Oncotype was 24, and she received chemotherapy 3. She is now doing well).   GYNECOLOGIC HISTORY:  No LMP recorded. Patient has had an ablation. Menarche age 37, first live birth age ? She is GX P2. She stopped having periods December 2012 when she underwent endometrial ablation. She used hormone replacement for more than 10 years remotely, without complications   SOCIAL HISTORY: (Updated May 2020). 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 She is divorced and lives at home with her daughters  Her children 5, age 98, and Candace [name], age 45, are doing well with online schoolwork. she had 2 dogs. The patient was brought up as an [place].    ADVANCED DIRECTIVES: In place. The patient's sister Halford Decamp is  her healthcare part of attorney   HEALTH MAINTENANCE: Social History   Tobacco Use  . Smoking status: Never Smoker  . Smokeless tobacco: Never Used  Substance Use Topics  . Alcohol use: No    Comment: none since 1996  . Drug use: No     Colonoscopy:  PAP:  Bone density: 03/28/2017 showed a T score of 0.8 normal   No Known Allergies  Current Outpatient Medications  Medication Sig Dispense Refill  . anastrozole (ARIMIDEX) 1 MG tablet Take 1 tablet (1 mg total) by mouth daily. 90 tablet 4  . buPROPion (WELLBUTRIN XL) 300 MG 24 hr tablet Take 300 mg by mouth daily.     . cetirizine (ZYRTEC) 10 MG tablet Take 10 mg by mouth daily. [facility]    . Cholecalciferol (VITAMIN D-3) 125 MCG (5000 UT) TABS Take 5,000 Units by  mouth daily. (Patient taking differently: Take 50 mg by mouth daily. ) 30 tablet   . esomeprazole (NEXIUM) 40 MG capsule Take 40 mg by mouth every 36 (thirty-six) hours.    [name] FLUoxetine (PROZAC) 10 MG capsule Take 10 mg by mouth daily with lunch.     . Insulin Pen Needle (BD PEN NEEDLE NANO 2ND GEN) 32G X 4 MM MISC 1 Package by Does not apply route daily. 100 each 0  . Liraglutide -Weight Management (SAXENDA) 18 MG/3ML SOPN Inject 0.1 mLs (0.6 mg total) into the skin daily. 15 mL 0  . metFORMIN (GLUCOPHAGE) 500 MG tablet Take 1 tablet (500 mg total) by mouth 2 (two) times daily with a meal. 60 tablet 0  . Multiple Vitamin (MULTIVITAMIN WITH MINERALS) TABS tablet Take 1 tablet by mouth daily.    [name] rOPINIRole (REQUIP) 0.25 MG tablet Take 0.5 mg by mouth daily at 8 pm.    . rosuvastatin (CRESTOR) 10 MG tablet Take 10 mg by mouth at bedtime.    . triamterene-hydrochlorothiazide (MAXZIDE-25) 37.5-25 MG tablet Take 1 tablet by mouth daily.     No current facility-administered medications for this visit.    OBJECTIVE:   There were no vitals filed for this visit. Wt Readings from Last 3 Encounters:  04/04/20 247 lb (112 kg)  03/21/20 246 lb (111.6  kg)  03/01/20 247 lb (112 kg)   There is no height or weight on file to calculate BMI.    ECOG FS:1 - Symptomatic but completely ambulatory     LAB RESULTS:  CMP     Component Value Date/Time   NA 140 04/04/2020 0810   NA 141 04/16/2017 1409   K 4.2 04/04/2020 0810   K 3.8 04/16/2017 1409   CL 100 04/04/2020 0810   CO2 27 04/04/2020 0810   CO2 25 04/16/2017 1409   GLUCOSE 113 (H) 04/04/2020 0810   GLUCOSE 120 (H) 04/15/2019 0941   GLUCOSE 134 04/16/2017 1409   BUN 16 04/04/2020 0810   BUN 12.4 04/16/2017 1409   CREATININE 0.75 04/04/2020 0810   CREATININE 0.87 04/15/2019 0941   CREATININE 0.9 04/16/2017 1409   CALCIUM 9.8 04/04/2020 0810   CALCIUM 9.6 04/16/2017 1409   PROT 6.7 04/04/2020 0810   PROT 7.0 04/16/2017 1409   ALBUMIN 4.5 04/04/2020 0810   ALBUMIN 4.0 04/16/2017 1409   AST 21 04/04/2020 0810   AST 17 04/15/2019 0941   AST 29 04/16/2017 1409   ALT 28 04/04/2020 0810   ALT 19 04/15/2019 0941   ALT 34 04/16/2017 1409   ALKPHOS 86 04/04/2020 0810   ALKPHOS 74 04/16/2017 1409   BILITOT 0.3 04/04/2020 0810   BILITOT 0.3 04/15/2019 0941   BILITOT 0.40 04/16/2017 1409   GFRNONAA 88 04/04/2020 0810   GFRNONAA >60 04/15/2019 0941   GFRAA 102 04/04/2020 0810   GFRAA >60 04/15/2019 0941    INo results found for: SPEP, UPEP  Lab Results  Component Value Date   WBC 7.1 04/15/2019   NEUTROABS 4.0 04/15/2019   HGB 13.4 04/15/2019   HCT 41.4 04/15/2019   MCV 83.8 04/15/2019   PLT 297 04/15/2019      Chemistry      Component Value Date/Time   NA 140 04/04/2020 0810   NA 141 04/16/2017 1409   K 4.2 04/04/2020 0810   K 3.8 04/16/2017 1409   CL 100 04/04/2020 0810   CO2 27 04/04/2020 0810   CO2 25 04/16/2017 1409   BUN  16 04/04/2020 0810   BUN 12.4 04/16/2017 1409   CREATININE 0.75 04/04/2020 0810   CREATININE 0.87 04/15/2019 0941   CREATININE 0.9 04/16/2017 1409      Component Value Date/Time   CALCIUM 9.8 04/04/2020 0810   CALCIUM 9.6  04/16/2017 1409   ALKPHOS 86 04/04/2020 0810   ALKPHOS 74 04/16/2017 1409   AST 21 04/04/2020 0810   AST 17 04/15/2019 0941   AST 29 04/16/2017 1409   ALT 28 04/04/2020 0810   ALT 19 04/15/2019 0941   ALT 34 04/16/2017 1409   BILITOT 0.3 04/04/2020 0810   BILITOT 0.3 04/15/2019 0941   BILITOT 0.40 04/16/2017 1409       No results found for: LABCA2  No components found for: LABCA125  No results for input(s): INR in the last 168 hours.  Urinalysis    Component Value Date/Time   COLORURINE YELLOW 01/24/2013 0327   APPEARANCEUR TURBID (A) 01/24/2013 0327   LABSPEC 1.021 01/24/2013 0327   PHURINE 7.5 01/24/2013 0327   GLUCOSEU NEGATIVE 01/24/2013 0327   HGBUR NEGATIVE 01/24/2013 0327   BILIRUBINUR NEGATIVE 01/24/2013 0327   KETONESUR 15 (A) 01/24/2013 0327   PROTEINUR NEGATIVE 01/24/2013 0327   UROBILINOGEN 0.2 01/24/2013 0327   NITRITE NEGATIVE 01/24/2013 0327   LEUKOCYTESUR NEGATIVE 01/24/2013 0327    STUDIES: No results found.   ELIGIBLE FOR AVAILABLE RESEARCH PROTOCOL: no  ASSESSMENT: 59 y.o. BRCA negative [place] woman status post right breast upper outer quadrant biopsy 04/05/2016 for a clinical T1b N0, stage IA invasive lobular breast cancer, grade 2 or 3, estrogen and progesterone receptor positive, HER-2 not amplified, with an MIB-1 of 20%  (1) genetics testing 04/23/2016 through the [facility] offered by [facility] Hope Pigeon, MD)  found no deleterious mutations in  ATM, BARD1, BRCA1, BRCA2, BRIP1, CDH1, CHEK2, FANCC, MLH1, MSH2, MSH6, NBN, PALB2, PMS2, PTEN, RAD51C, RAD51D, TP53, and XRCC2.  This panel also includes deletion/duplication analysis (without sequencing) for one gene, EPCAM  (2) status post right lumpectomy and sentinel lymph node sampling 05/28/2016 for a pT1b pN0, stage IA invasive lobular breast cancer, grade 2, with negative margins  (3) Oncotype Dx score of 22 predicts a 10 year risk of recurrence outside the  breast of 14% if the patient's only systemic therapy is tamoxifen for 5 years. It predicts a benefit from chemotherapy in the 4% range.   (a) Patient opted against adjuvant chemotherapy   (4) adjuvant radiation 07/22/16-09/04/16 Site/dose:   1) Right breast/ 50.4 Gy in 28 fractions                         2) Right breast boost/ 10 Gy in 5 fractions  (5) started anastrozole 09/29/2016   (a) bone density 03/28/2017 at the [facility] showed a T score of 0.8, normal   (b) repeat bone density 04/08/2019 showed a T score of 0.3 (normal).   PLAN: Estoria did not show for her visit 04/17/2020.  A follow-up letter has been sent  Donzel Romack, Virgie Dad, MD  04/17/20 7:06 PM Medical Oncology and Hematology [facility] [place], [place] [address] Tel. [phone]    Fax. [phone]   I, Wilburn Mylar, am acting as scribe for Dr. Virgie Dad. Kamilla Hands.  I, Lurline Del MD, have reviewed the above documentation for accuracy and completeness, and I agree with the above.    *Total Encounter Time as defined by the Centers for Medicare and  Medicaid Services includes, in addition to the face-to-face time of a patient visit (documented in the note above) non-face-to-face time: obtaining and reviewing outside history, ordering and reviewing medications, tests or procedures, care coordination (communications with other health care professionals or caregivers) and documentation in the medical record.

## 2020-04-17 ENCOUNTER — Inpatient Hospital Stay: Payer: 59 | Attending: Oncology | Admitting: Oncology

## 2020-04-17 ENCOUNTER — Encounter: Payer: Self-pay | Admitting: Oncology

## 2020-04-17 ENCOUNTER — Inpatient Hospital Stay: Payer: 59

## 2020-04-17 DIAGNOSIS — C50411 Malignant neoplasm of upper-outer quadrant of right female breast: Secondary | ICD-10-CM

## 2020-04-17 DIAGNOSIS — Z170 Estrogen receptor positive status [ER+]: Secondary | ICD-10-CM

## 2020-04-18 ENCOUNTER — Telehealth (INDEPENDENT_AMBULATORY_CARE_PROVIDER_SITE_OTHER): Payer: 59 | Admitting: Psychology

## 2020-04-18 ENCOUNTER — Other Ambulatory Visit: Payer: Self-pay

## 2020-04-18 ENCOUNTER — Ambulatory Visit (INDEPENDENT_AMBULATORY_CARE_PROVIDER_SITE_OTHER): Payer: 59 | Admitting: Physician Assistant

## 2020-04-18 VITALS — BP 123/77 | HR 104 | Temp 98.3°F | Ht 67.0 in | Wt 245.0 lb

## 2020-04-18 DIAGNOSIS — R7303 Prediabetes: Secondary | ICD-10-CM | POA: Diagnosis not present

## 2020-04-18 DIAGNOSIS — F5089 Other specified eating disorder: Secondary | ICD-10-CM | POA: Diagnosis not present

## 2020-04-18 DIAGNOSIS — Z9189 Other specified personal risk factors, not elsewhere classified: Secondary | ICD-10-CM | POA: Diagnosis not present

## 2020-04-18 DIAGNOSIS — Z170 Estrogen receptor positive status [ER+]: Secondary | ICD-10-CM

## 2020-04-18 DIAGNOSIS — E559 Vitamin D deficiency, unspecified: Secondary | ICD-10-CM | POA: Diagnosis not present

## 2020-04-18 DIAGNOSIS — Z6838 Body mass index (BMI) 38.0-38.9, adult: Secondary | ICD-10-CM | POA: Diagnosis not present

## 2020-04-18 DIAGNOSIS — C50411 Malignant neoplasm of upper-outer quadrant of right female breast: Secondary | ICD-10-CM

## 2020-04-18 DIAGNOSIS — E7849 Other hyperlipidemia: Secondary | ICD-10-CM | POA: Diagnosis not present

## 2020-04-19 MED FILL — METFORMIN HCL 500 MG TABS: 500 | 30 days supply | Qty: 60 | Fill #0

## 2020-04-19 MED FILL — ESOMEPRAZOLE MAG DR 40 MG C: 40 | 90 days supply | Qty: 90 | Fill #1

## 2020-04-19 NOTE — Progress Notes (Signed)
Chief Complaint:   OBESITY Candace [name] is here to discuss her progress with her obesity treatment plan along with follow-up of her obesity related diagnoses. Candace [name] is on the Category 4 Plan and states she is following her eating plan approximately 60% of the time. Candace [name] states she is exercising 0 minutes 0 times per week.  Today's visit was #: 60 Starting weight: 249 lbs Starting date: 09/17/2017 Today's weight: 245 lbs Today's date: 04/18/2020 Total lbs lost to date: 4 Total lbs lost since last in-office visit: 2  Interim History: Candace [name] states that she is not snacking between lunch and dinner and by dinnertime she is starving and does not make the best choices. She is on 1.2 mg of Saxenda.  Subjective:   Other hyperlipidemia. Candace [name] is on Crestor. Last lipid panel was not at goal.  Lab Results  Component Value Date   CHOL 187 04/04/2020   HDL 48 04/04/2020   LDLCALC 95 04/04/2020   TRIG 262 (H) 04/04/2020   CHOLHDL 3.9 04/04/2020   Lab Results  Component Value Date   ALT 28 04/04/2020   AST 21 04/04/2020   ALKPHOS 86 04/04/2020   BILITOT 0.3 04/04/2020   The 10-year ASCVD risk score Candace [name] DC Jr., et al., 2013) is: 3.5%   Values used to calculate the score:     Age: 59 years     Sex: Female     Is Non-Hispanic African American: No     Diabetic: No     Tobacco smoker: No     Systolic Blood Pressure: 967 mmHg     Is BP treated: Yes     HDL Cholesterol: 48 mg/dL     Total Cholesterol: 187 mg/dL  Prediabetes. Candace [name] has a diagnosis of prediabetes based on her elevated HgA1c and was informed this puts her at greater risk of developing diabetes. She continues to work on diet and exercise to decrease her risk of diabetes. She denies nausea or hypoglycemia. Last A1c worsened to 6.2. Candace [name] is on metformin.  Lab Results  Component Value Date   HGBA1C 6.2 (H) 04/04/2020   Lab Results  Component Value Date   INSULIN 22.9 04/04/2020   INSULIN 12.6 09/08/2019    INSULIN 23.8 02/04/2019   INSULIN 20.6 08/06/2018   INSULIN 15.2 02/18/2018   Vitamin D deficiency. Candace [name] is on OTC Vitamin D.  Last level was not at goal.   Ref. Range 04/04/2020 08:10  Vitamin D, 25-Hydroxy Latest Ref Range: 30.0 - 100.0 ng/mL 41.2   At risk for diabetes mellitus. Candace [name] is at higher than average risk for developing diabetes due to her obesity.   Assessment/Plan:   Other hyperlipidemia. Cardiovascular risk and specific lipid/LDL goals reviewed.  We discussed several lifestyle modifications today and [name] will continue to work on diet, exercise and weight loss efforts. Orders and follow up as documented in patient record. She will continue her medications as directed and continue weight loss.  Counseling Intensive lifestyle modifications are the first line treatment for this issue.  Dietary changes: Increase soluble fiber. Decrease simple carbohydrates.  Exercise changes: Moderate to vigorous-intensity aerobic activity 150 minutes per week if tolerated.  Lipid-lowering medications: see documented in medical record.  Prediabetes. Candace [name] will continue to work on weight loss, exercise, and decreasing simple carbohydrates to help decrease the risk of diabetes. She will continue metformin as directed and will decrease her intake of simple carbs.  Vitamin D deficiency. Low Vitamin D level contributes to fatigue  and are associated with obesity, breast, and colon cancer. She will change to Vitamin D 5,000 units daily (no prescription) and will follow-up for routine testing of Vitamin D, at least 2-3 times per year to avoid over-replacement.  At risk for diabetes mellitus. Candace [name] was given approximately 15 minutes of diabetes education and counseling today. We discussed intensive lifestyle modifications today with an emphasis on weight loss as well as increasing exercise and decreasing simple carbohydrates in her diet. We also reviewed medication options with an emphasis on  risk versus benefit of those discussed.   Repetitive spaced learning was employed today to elicit superior memory formation and behavioral change.  Class 2 severe obesity with serious comorbidity and body mass index (BMI) of 38.0 to 38.9 in adult, unspecified obesity type ([place]). Candace [name]'s Saxenda was increased to 1.8.  Candace [name] is currently in the action stage of change. As such, her goal is to continue with weight loss efforts. She has agreed to the Category 4 Plan.   Exercise goals: For substantial health benefits, adults should do at least 150 minutes (2 hours and 30 minutes) a week of moderate-intensity, or 75 minutes (1 hour and 15 minutes) a week of vigorous-intensity aerobic physical activity, or an equivalent combination of moderate- and vigorous-intensity aerobic activity. Aerobic activity should be performed in episodes of at least 10 minutes, and preferably, it should be spread throughout the week.  Behavioral modification strategies: decreasing simple carbohydrates and meal planning and cooking strategies.  Candace [name] has agreed to follow-up with our clinic in 2 weeks. She was informed of the importance of frequent follow-up visits to maximize her success with intensive lifestyle modifications for her multiple health conditions.   Objective:   Blood pressure 123/77, pulse (!) 104, temperature 98.3 F (36.8 C), height 5\' 7"  (1.702 m), weight 245 lb (111.1 kg), SpO2 100 %. Body mass index is 38.37 kg/m.  General: Cooperative, alert, well developed, in no acute distress. HEENT: Conjunctivae and lids unremarkable. Cardiovascular: Regular rhythm.  Lungs: Normal work of breathing. Neurologic: No focal deficits.   Lab Results  Component Value Date   CREATININE 0.75 04/04/2020   BUN 16 04/04/2020   NA 140 04/04/2020   K 4.2 04/04/2020   CL 100 04/04/2020   CO2 27 04/04/2020   Lab Results  Component Value Date   ALT 28 04/04/2020   AST 21 04/04/2020   ALKPHOS 86 04/04/2020    BILITOT 0.3 04/04/2020   Lab Results  Component Value Date   HGBA1C 6.2 (H) 04/04/2020   HGBA1C 6.0 (H) 09/08/2019   HGBA1C 6.2 (H) 02/04/2019   HGBA1C 5.9 (H) 08/06/2018   HGBA1C 5.9 (H) 02/18/2018   Lab Results  Component Value Date   INSULIN 22.9 04/04/2020   INSULIN 12.6 09/08/2019   INSULIN 23.8 02/04/2019   INSULIN 20.6 08/06/2018   INSULIN 15.2 02/18/2018   Lab Results  Component Value Date   TSH 2.390 09/17/2017   Lab Results  Component Value Date   CHOL 187 04/04/2020   HDL 48 04/04/2020   LDLCALC 95 04/04/2020   TRIG 262 (H) 04/04/2020   CHOLHDL 3.9 04/04/2020   Lab Results  Component Value Date   WBC 7.1 04/15/2019   HGB 13.4 04/15/2019   HCT 41.4 04/15/2019   MCV 83.8 04/15/2019   PLT 297 04/15/2019   No results found for: IRON, TIBC, FERRITIN  Attestation Statements:   Reviewed by clinician on day of visit: allergies, medications, problem list, medical history, surgical history, family history,  social history, and previous encounter notes.  IMichaelene Song, am acting as transcriptionist for [name], PA-C   I have reviewed the above documentation for accuracy and completeness, and I agree with the above. [name], PA-C

## 2020-04-20 ENCOUNTER — Ambulatory Visit: Payer: 59 | Admitting: Family Medicine

## 2020-04-20 ENCOUNTER — Encounter: Payer: Self-pay | Admitting: Family Medicine

## 2020-04-20 VITALS — BP 144/89 | HR 92 | Ht 67.0 in | Wt 245.0 lb

## 2020-04-20 DIAGNOSIS — G4733 Obstructive sleep apnea (adult) (pediatric): Secondary | ICD-10-CM | POA: Diagnosis not present

## 2020-04-20 DIAGNOSIS — Z9989 Dependence on other enabling machines and devices: Secondary | ICD-10-CM

## 2020-04-20 NOTE — Progress Notes (Addendum)
PATIENT: Candace [name] DOB: 1960-07-28  REASON FOR VISIT: follow up HISTORY FROM: patient  Chief Complaint  Patient presents with  . Follow-up    rm 16  . Sleep Apnea     HISTORY OF PRESENT ILLNESS: Today 04/20/20 Candace [name] is a 59 y.o. female here today for follow up for OSA on CPAP.  She reports that she is struggled with compliance over the past 3 months.  After last being seen in July she was very motivated to continue CPAP.  She feels that she is doing really well for a few months but has not tolerated using a full facemask.  She feels that she gets frustrated with the discomfort of the mask and takes it off in the middle the night.  She continues to try to use CPAP every night.  She does recognize improvement in sleep quality and improve daytime energy levels when using CPAP consistently.  She is motivated to continue working on compliance.  Compliance report dated 03/20/2020 through 04/18/2020 reveals that she used CPAP 26 of the past 30 days for compliance of 87%.  She used CPAP greater than 4 hours 4 of the last 30 days for compliance of 13%.  Average usage on days used was 2 hours and 13 minutes.  Residual AHI was 1.9 on a set pressure of 12 cm of water and EPR 3.  There was a leak noted in the 90 to percentile of 24.9 L/min.  HISTORY: (copied from Dr Guadelupe Sabin note on 01/26/2020)  Ms. Benecke is a 59 year old right-handed woman with an underlying medical history of prediabetes, seasonal allergies, restless leg syndrome, reflux disease, hyperlipidemia, depression, anxiety, breast cancer, with status post radiation treatment after lumpectomy,vitamin D deficiency and obesity, who Presents for follow-up consultation of her obstructive sleep apnea, after interim sleep study testing and starting CPAP therapy.  The patient is unaccompanied today.  I first met her on 09/15/2019 as a referral from weight management for concern for sleep apnea.  She reported snoring and excessive daytime  somnolence at the time as well as difficulty initiating and maintaining sleep and a history of restless leg syndrome.  She was advised to proceed with sleep study testing.  He had a baseline sleep study, followed by a CPAP titration study.  Her baseline sleep study from 10/05/2019 indicated severe obstructive sleep apnea with a total AHI of 53.7/h, supine AHI of 110.5/h, O2 nadir of 78%.  She had absence of REM sleep during that test.  She was advised to return for a full night titration study, which she had on 11/04/2019.  She was fitted with a small full facemask and titrated from 5 cm to 12 cm.  On the final pressure of 12 cm, her AHI was 0/h with supine non-REM sleep achieved an O2 nadir of 89%.  She achieved 18.6% of sleep during the test.  She was advised to proceed with CPAP therapy at home, set up date was 12/09/2019.  Today, 01/26/2020: I reviewed her CPAP Compliance data from 12/09/2019 through 01/07/2020, which is a total of 30 days, during which time she used her machine every night with percent use days greater than 4 hours at 67%, indicating slightly suboptimal compliance.  Average usage for days on treatment of 4 hours and 10 minutes, residual AHI 3.8/h, leak on the higher end with a 95th percentile at 18 L/min on a pressure of 12 cm with EPR of 3.  In the past couple of weeks her compliance  has declined a little bit, average usage for days on treatment is about 3 hours.  She reports that she is still adjusting to the machine.  Sometimes she admits that she is falling asleep on the couch and slept for a couple of hours before she woke up and realized that she had not put the mask on.  She uses a full facemask, she has noticed some benefit her daytime energy and improvement in her restless leg symptoms although she is also taking a slightly higher dose of her ropinirole.  She is currently on 0.5 mg at bedtime.  She reports that she had flareup of her restless leg symptoms after the baseline sleep study.  She  is motivated to continue with her CPAP and has no major complaints about it.  She has taken it to a trip to [place] recently.  She also took a trip to [place] with her family but was not able to stay.  Hence the lapses in treatment even and around January 09, 2020.   REVIEW OF SYSTEMS: Out of a complete 14 system review of symptoms, the patient complains only of the following symptoms, restless legs, fatigue and all other reviewed systems are negative.  FSS: 29 Did not complete ESS  ALLERGIES: No Known Allergies  HOME MEDICATIONS: Outpatient Medications Prior to Visit  Medication Sig Dispense Refill  . anastrozole (ARIMIDEX) 1 MG tablet Take 1 tablet (1 mg total) by mouth daily. 90 tablet 4  . buPROPion (WELLBUTRIN XL) 300 MG 24 hr tablet Take 300 mg by mouth daily.     . cetirizine (ZYRTEC) 10 MG tablet Take 10 mg by mouth daily. [facility]    . Cholecalciferol (VITAMIN D-3) 125 MCG (5000 UT) TABS Take 5,000 Units by mouth daily. (Patient taking differently: Take 50 mg by mouth daily. ) 30 tablet   . esomeprazole (NEXIUM) 40 MG capsule Take 40 mg by mouth every 36 (thirty-six) hours.    [name] FLUoxetine (PROZAC) 10 MG capsule Take 10 mg by mouth daily with lunch.     . Insulin Pen Needle (BD PEN NEEDLE NANO 2ND GEN) 32G X 4 MM MISC 1 Package by Does not apply route daily. 100 each 0  . Liraglutide -Weight Management (SAXENDA) 18 MG/3ML SOPN Inject 0.1 mLs (0.6 mg total) into the skin daily. 15 mL 0  . metFORMIN (GLUCOPHAGE) 500 MG tablet Take 1 tablet (500 mg total) by mouth 2 (two) times daily with a meal. 60 tablet 0  . Multiple Vitamin (MULTIVITAMIN WITH MINERALS) TABS tablet Take 1 tablet by mouth daily.    [name] rOPINIRole (REQUIP) 0.25 MG tablet Take 0.5 mg by mouth daily at 8 pm.    . rosuvastatin (CRESTOR) 10 MG tablet Take 10 mg by mouth at bedtime.    . triamterene-hydrochlorothiazide (MAXZIDE-25) 37.5-25 MG tablet Take 1 tablet by mouth daily.     No  facility-administered medications prior to visit.    PAST MEDICAL HISTORY: Past Medical History:  Diagnosis Date  . Abnormal Pap smear   . Alcohol abuse   . Anovulation   . Anxiety   . Breast cancer ([facility]) 04/05/2016   right breast  . Cancer ([facility]) 2017   right breast cancer  . Complication of anesthesia    slow to wake up  . Depression   . Endometrial hyperplasia   . Endometrial polyp    h/o  . Epidermal cyst    h/o  . Gallbladder problem   . GERD (gastroesophageal reflux  disease)   . H/O hemorrhoids   . H/O menorrhagia   . High risk HPV infection   . Hyperlipidemia   . Insomnia   . Obesity   . Oligomenorrhea   . Prediabetes   . Restless legs syndrome   . Seasonal allergies   . Vitamin D deficiency     PAST SURGICAL HISTORY: Past Surgical History:  Procedure Laterality Date  . BREAST LUMPECTOMY Right 05/2016   radiation  . BREAST LUMPECTOMY WITH RADIOACTIVE SEED AND SENTINEL LYMPH NODE BIOPSY Right 05/28/2016   Procedure: RADIOACTIVE SEED GUIDED RIGHT BREAST LUMPECTOMY WITH  RIGHT AXILLARY SENTINEL LYMPH NODE BIOPSY;  Surgeon: [name], MD;  Location: [facility];  Service: General;  Laterality: Right;  . CARPAL TUNNEL RELEASE  03/26/2011   right hand  . CARPAL TUNNEL RELEASE  05/28/2011   Procedure: CARPAL TUNNEL RELEASE;  Surgeon: [name];  Location: [facility];  Service: Orthopedics;  Laterality: Left;  . CHOLECYSTECTOMY N/A 01/24/2013   Procedure: LAPAROSCOPIC CHOLECYSTECTOMY WITH INTRAOPERATIVE CHOLANGIOGRAM;  Surgeon: [name], MD;  Location: [facility];  Service: General;  Laterality: N/A;  . COLONOSCOPY    . HYSTEROSCOPY    . POLYPECTOMY    . TONSILLECTOMY  1967   as child  . trigger fingers  8 yrs. ago   both done months apart  . uterine ablation  06/04/2010  . uterine ablation      FAMILY HISTORY: Family History  Problem Relation Age of Onset  . Heart disease Mother   . Hypertension Mother   . Stroke Mother   . Dementia  Mother        vascular dementia  . Hyperlipidemia Mother   . Depression Mother   . Anxiety disorder Mother   . Alcohol abuse Mother   . Obesity Mother   . Pulmonary fibrosis Father        d. 64  . Depression Father   . Anxiety disorder Father   . Alcohol abuse Father   . Heart attack Maternal Grandmother        d. [phone]  . Heart disease Maternal Grandmother   . Heart disease Maternal Grandfather        d. 60-63  . Leukemia Paternal Grandmother        d. late 76s  . Breast cancer Sister 63       IDC s/p mastectomy and chemo    SOCIAL HISTORY: Social History   Socioeconomic History  . Marital status: Divorced    Spouse name: Not on file  . Number of children: 2  . Years of education: Not on file  . Highest education level: Not on file  Occupational History  . Not on file  Tobacco Use  . Smoking status: Never Smoker  . Smokeless tobacco: Never Used  Substance and Sexual Activity  . Alcohol use: No    Comment: none since 1996  . Drug use: No  . Sexual activity: Not Currently    Birth control/protection: None  Other Topics Concern  . Not on file  Social History Narrative  . Not on file   Social Determinants of Health   Financial Resource Strain:   . Difficulty of Paying Living Expenses: Not on file  Food Insecurity:   . Worried About Charity fundraiser in the Last Year: Not on file  . Ran Out of Food in the Last Year: Not on file  Transportation Needs:   . Lack of Transportation (Medical): Not on file  .  Lack of Transportation (Non-Medical): Not on file  Physical Activity:   . Days of Exercise per Week: Not on file  . Minutes of Exercise per Session: Not on file  Stress:   . Feeling of Stress : Not on file  Social Connections:   . Frequency of Communication with Friends and Family: Not on file  . Frequency of Social Gatherings with Friends and Family: Not on file  . Attends Religious Services: Not on file  . Active Member of Clubs or Organizations: Not  on file  . Attends Archivist Meetings: Not on file  . Marital Status: Not on file  Intimate Partner Violence:   . Fear of Current or Ex-Partner: Not on file  . Emotionally Abused: Not on file  . Physically Abused: Not on file  . Sexually Abused: Not on file     PHYSICAL EXAM  Vitals:   04/20/20 1325 04/20/20 1356  BP: (!) 196/89 (!) 144/89  Pulse: 100 92  Weight: 245 lb (111.1 kg)   Height: '5\' 7"'  (1.702 m)    Body mass index is 38.37 kg/m.  Generalized: Well developed, in no acute distress  Cardiology: normal rate and rhythm, no murmur noted Respiratory: clear to auscultation bilaterally  Neurological examination  Mentation: Alert oriented to time, place, history taking. Follows all commands speech and language fluent Cranial nerve II-XII: Pupils were equal round reactive to light. Extraocular movements were full, visual field were full  Motor: The motor testing reveals 5 over 5 strength of all 4 extremities. Good symmetric motor tone is noted throughout.  Gait and station: Gait is normal.    DIAGNOSTIC DATA (LABS, IMAGING, TESTING) - I reviewed patient records, labs, notes, testing and imaging myself where available.  No flowsheet data found.   Lab Results  Component Value Date   WBC 7.1 04/15/2019   HGB 13.4 04/15/2019   HCT 41.4 04/15/2019   MCV 83.8 04/15/2019   PLT 297 04/15/2019      Component Value Date/Time   NA 140 04/04/2020 0810   NA 141 04/16/2017 1409   K 4.2 04/04/2020 0810   K 3.8 04/16/2017 1409   CL 100 04/04/2020 0810   CO2 27 04/04/2020 0810   CO2 25 04/16/2017 1409   GLUCOSE 113 (H) 04/04/2020 0810   GLUCOSE 120 (H) 04/15/2019 0941   GLUCOSE 134 04/16/2017 1409   BUN 16 04/04/2020 0810   BUN 12.4 04/16/2017 1409   CREATININE 0.75 04/04/2020 0810   CREATININE 0.87 04/15/2019 0941   CREATININE 0.9 04/16/2017 1409   CALCIUM 9.8 04/04/2020 0810   CALCIUM 9.6 04/16/2017 1409   PROT 6.7 04/04/2020 0810   PROT 7.0 04/16/2017  1409   ALBUMIN 4.5 04/04/2020 0810   ALBUMIN 4.0 04/16/2017 1409   AST 21 04/04/2020 0810   AST 17 04/15/2019 0941   AST 29 04/16/2017 1409   ALT 28 04/04/2020 0810   ALT 19 04/15/2019 0941   ALT 34 04/16/2017 1409   ALKPHOS 86 04/04/2020 0810   ALKPHOS 74 04/16/2017 1409   BILITOT 0.3 04/04/2020 0810   BILITOT 0.3 04/15/2019 0941   BILITOT 0.40 04/16/2017 1409   GFRNONAA 88 04/04/2020 0810   GFRNONAA >60 04/15/2019 0941   GFRAA 102 04/04/2020 0810   GFRAA >60 04/15/2019 0941   Lab Results  Component Value Date   CHOL 187 04/04/2020   HDL 48 04/04/2020   LDLCALC 95 04/04/2020   TRIG 262 (H) 04/04/2020   CHOLHDL 3.9 04/04/2020  Lab Results  Component Value Date   HGBA1C 6.2 (H) 04/04/2020   Lab Results  Component Value Date   JYNWGNFA21 308 09/17/2017   Lab Results  Component Value Date   TSH 2.390 09/17/2017     ASSESSMENT AND PLAN 59 y.o. year old female  has a past medical history of Abnormal Pap smear, Alcohol abuse, Anovulation, Anxiety, Breast cancer ([facility]) (04/05/2016), Cancer ([facility]) (6578), Complication of anesthesia, Depression, Endometrial hyperplasia, Endometrial polyp, Epidermal cyst, Gallbladder problem, GERD (gastroesophageal reflux disease), H/O hemorrhoids, H/O menorrhagia, High risk HPV infection, Hyperlipidemia, Insomnia, Obesity, Oligomenorrhea, Prediabetes, Restless legs syndrome, Seasonal allergies, and Vitamin D deficiency. here with     ICD-10-CM   1. OSA on CPAP  G47.33 For home use only DME continuous positive airway pressure (CPAP)   Z99.89      Candace [name] continues to struggle with using CPAP therapy for greater than 4 hours.  She is very uncomfortable using a full facemask.  She would like to try a nasal mask or nasal pillow.  I will send orders for a mask refitting today.[name] She was encouraged to continue using CPAP nightly and for greater than 4 hours each night. We will update supply orders as indicated. Risks of untreated sleep apnea  review and education materials provided. Healthy lifestyle habits encouraged. She will follow up in 3 months, sooner if needed. She verbalizes understanding and agreement with this plan.   Orders Placed This Encounter  Procedures  . For home use only DME continuous positive airway pressure (CPAP)    Mask refitting please    Order Specific Question:   Length of Need    Answer:   Lifetime    Order Specific Question:   Patient has OSA or probable OSA    Answer:   Yes    Order Specific Question:   Is the patient currently using CPAP in the home    Answer:   Yes    Order Specific Question:   Settings    Answer:   Other see comments    Order Specific Question:   CPAP supplies needed    Answer:   Mask, headgear, cushions, filters, heated tubing and water chamber     No orders of the defined types were placed in this encounter.     I spent 15 minutes with the patient. 50% of this time was spent counseling and educating patient on plan of care and medications.    Debbora Presto, FNP-C 04/20/2020, 3:27 PM [facility] [address], [place], [place] [address] [phone]  I reviewed the above note and documentation by the Nurse Practitioner and agree with the history, exam, assessment and plan as outlined above. I was available for consultation. Star Age, MD, PhD [facility])

## 2020-04-20 NOTE — Patient Instructions (Signed)
Please continue using your CPAP regularly. While your insurance requires that you use CPAP at least 4 hours each night on 70% of the nights, I recommend, that you not skip any nights and use it throughout the night if you can. Getting used to CPAP and staying with the treatment long term does take time and patience and discipline. Untreated obstructive sleep apnea when it is moderate to severe can have an adverse impact on cardiovascular health and raise her risk for heart disease, arrhythmias, hypertension, congestive heart failure, stroke and diabetes. Untreated obstructive sleep apnea causes sleep disruption, nonrestorative sleep, and sleep deprivation. This can have an impact on your day to day functioning and cause daytime sleepiness and impairment of cognitive function, memory loss, mood disturbance, and problems focussing. Using CPAP regularly can improve these symptoms.   We will send orders to [facility] for a mask refitting. Please keep working toward your goals.   We will follow up in 3 months    Sleep Apnea Sleep apnea affects breathing during sleep. It causes breathing to stop for a short time or to become shallow. It can also increase the risk of:  Heart attack.  Stroke.  Being very overweight (obese).  Diabetes.  Heart failure.  Irregular heartbeat. The goal of treatment is to help you breathe normally again. What are the causes? There are three kinds of sleep apnea:  Obstructive sleep apnea. This is caused by a blocked or collapsed airway.  Central sleep apnea. This happens when the brain does not send the right signals to the muscles that control breathing.  Mixed sleep apnea. This is a combination of obstructive and central sleep apnea. The most common cause of this condition is a collapsed or blocked airway. This can happen if:  Your throat muscles are too relaxed.  Your tongue and tonsils are too large.  You are overweight.  Your airway is too small. What  increases the risk?  Being overweight.  Smoking.  Having a small airway.  Being older.  Being female.  Drinking alcohol.  Taking medicines to calm yourself (sedatives or tranquilizers).  Having family members with the condition. What are the signs or symptoms?  Trouble staying asleep.  Being sleepy or tired during the day.  Getting angry a lot.  Loud snoring.  Headaches in the morning.  Not being able to focus your mind (concentrate).  Forgetting things.  Less interest in sex.  Mood swings.  Personality changes.  Feelings of sadness (depression).  Waking up a lot during the night to pee (urinate).  Dry mouth.  Sore throat. How is this diagnosed?  Your medical history.  A physical exam.  A test that is done when you are sleeping (sleep study). The test is most often done in a sleep lab but may also be done at home. How is this treated?   Sleeping on your side.  Using a medicine to get rid of mucus in your nose (decongestant).  Avoiding the use of alcohol, medicines to help you relax, or certain pain medicines (narcotics).  Losing weight, if needed.  Changing your diet.  Not smoking.  Using a machine to open your airway while you sleep, such as: ? An oral appliance. This is a mouthpiece that shifts your lower jaw forward. ? A CPAP device. This device blows air through a mask when you breathe out (exhale). ? An EPAP device. This has valves that you put in each nostril. ? A BPAP device. This device blows air through  a mask when you breathe in (inhale) and breathe out.  Having surgery if other treatments do not work. It is important to get treatment for sleep apnea. Without treatment, it can lead to:  High blood pressure.  Coronary artery disease.  In men, not being able to have an erection (impotence).  Reduced thinking ability. Follow these instructions at home: Lifestyle  Make changes that your doctor recommends.  Eat a healthy  diet.  Lose weight if needed.  Avoid alcohol, medicines to help you relax, and some pain medicines.  Do not use any products that contain nicotine or tobacco, such as cigarettes, e-cigarettes, and chewing tobacco. If you need help quitting, ask your doctor. General instructions  Take over-the-counter and prescription medicines only as told by your doctor.  If you were given a machine to use while you sleep, use it only as told by your doctor.  If you are having surgery, make sure to tell your doctor you have sleep apnea. You may need to bring your device with you.  Keep all follow-up visits as told by your doctor. This is important. Contact a doctor if:  The machine that you were given to use during sleep bothers you or does not seem to be working.  You do not get better.  You get worse. Get help right away if:  Your chest hurts.  You have trouble breathing in enough air.  You have an uncomfortable feeling in your back, arms, or stomach.  You have trouble talking.  One side of your body feels weak.  A part of your face is hanging down. These symptoms may be an emergency. Do not wait to see if the symptoms will go away. Get medical help right away. Call your local emergency services (911 in the [place] Do not drive yourself to the hospital. Summary  This condition affects breathing during sleep.  The most common cause is a collapsed or blocked airway.  The goal of treatment is to help you breathe normally while you sleep. This information is not intended to replace advice given to you by your health care provider. Make sure you discuss any questions you have with your health care provider. Document Revised: 04/03/2018 Document Reviewed: 02/10/2018 Elsevier Patient Education  [place].

## 2020-04-25 NOTE — Progress Notes (Signed)
  Office: [phone]  /  Fax: [phone]    Date: May 09, 2020   Appointment Start Time: 8:36am Duration: 26 minutes Provider: Glennie Isle, Psy.D. Type of Session: Individual Therapy  Location of Patient: Home Location of Provider: Provider's Home Type of Contact: Telepsychological Visit via MyChart Video Visit  Session Content:  Candace [name] was observed joining the appointment early today; therefore, this provider called her at 8:35am and offered to meet early. She agreed. [name] is a 59 y.o. female presenting for a follow-up appointment to address the previously established treatment goal of increasing coping skills. Today's appointment was a telepsychological visit due to COVID-19. [name] provided verbal consent for today's telepsychological appointment and she is aware she is responsible for securing confidentiality on her end of the session. Prior to proceeding with today's appointment, [name]'s physical location at the time of this appointment was obtained as well a phone number she could be reached at in the event of technical difficulties. [name] and this provider participated in today's telepsychological service.   This provider conducted a brief check-in. [name] shared about recent lab results, noting [name], PA-C made changes to her meal plan based on the lab work. She acknowledged the aforementioned impacted her mood. [name] shared she is now in a place of "acceptance." She also dicussed reflecting on upcoming holiday plans, noting she intends to make changes to this year's meal. Positive reinforcement was provided. Learned skills were reviewed. [name] shared a habit tracker she purchased to assist with the aforementioned. She also reported she purchased a meal planner to help with the new changes to her meal plan. Overall, [name] was receptive to today's appointment as evidenced by openness to sharing, responsiveness to feedback, and willingness to continue engaging in  learned skills.  Mental Status Examination:  Appearance: well groomed and appropriate hygiene  Behavior: appropriate to circumstances Mood: euthymic Affect: mood congruent Speech: normal in rate, volume, and tone Eye Contact: appropriate Psychomotor Activity: appropriate Gait: unable to assess Thought Process: linear, logical, and goal directed  Thought Content/Perception: no hallucinations, delusions, bizarre thinking or behavior reported or observed and no evidence of suicidal and homicidal ideation, plan, and intent Orientation: time, person, place, and purpose of appointment Memory/Concentration: memory, attention, language, and fund of knowledge intact  Insight/Judgment: good  Interventions:  Conducted a brief chart review Provided empathic reflections and validation Provided positive reinforcement Employed supportive psychotherapy interventions to facilitate reduced distress and to improve coping skills with identified stressors Reviewed learned skills  DSM-5 Diagnosis(es): 307.59 (F50.8) Other Specified Feeding or Eating Disorder, Emotional Eating Behaviors  Treatment Goal & Progress: During the initial appointment with this provider, the following treatment goal was established: increase coping skills. [name] demonstrated progress in her goal as evidenced by increased awareness of hunger patterns and increased awareness of triggers for emotional eating. [name] also continues to demonstrate willingness to engage in learned skill(s).  Plan: Today was [name]'s last appointment with this provider and she noted, "I feel pretty good." She acknowledged understanding that she may request a follow-up appointment with this provider in the future (following this provider's maternity leave as previously discussed) as long as she is still established with the clinic. No further follow-up planned by this provider.

## 2020-04-26 ENCOUNTER — Encounter (INDEPENDENT_AMBULATORY_CARE_PROVIDER_SITE_OTHER): Payer: Self-pay | Admitting: Physician Assistant

## 2020-04-26 NOTE — Telephone Encounter (Signed)
fyi

## 2020-04-28 ENCOUNTER — Ambulatory Visit
Admission: RE | Admit: 2020-04-28 | Discharge: 2020-04-28 | Disposition: A | Discharge status: Home / Self Care | Payer: 59 | Source: Ambulatory Visit | Attending: Oncology | Admitting: Oncology

## 2020-04-28 ENCOUNTER — Other Ambulatory Visit: Payer: Self-pay | Admitting: Oncology

## 2020-04-28 ENCOUNTER — Other Ambulatory Visit: Payer: Self-pay

## 2020-04-28 DIAGNOSIS — N6489 Other specified disorders of breast: Secondary | ICD-10-CM

## 2020-04-28 DIAGNOSIS — N6320 Unspecified lump in the left breast, unspecified quadrant: Secondary | ICD-10-CM

## 2020-04-28 DIAGNOSIS — R928 Other abnormal and inconclusive findings on diagnostic imaging of breast: Secondary | ICD-10-CM | POA: Diagnosis not present

## 2020-04-28 DIAGNOSIS — Z9889 Other specified postprocedural states: Secondary | ICD-10-CM

## 2020-05-04 ENCOUNTER — Other Ambulatory Visit: Payer: Self-pay

## 2020-05-04 ENCOUNTER — Ambulatory Visit (INDEPENDENT_AMBULATORY_CARE_PROVIDER_SITE_OTHER): Payer: 59 | Admitting: Physician Assistant

## 2020-05-04 ENCOUNTER — Other Ambulatory Visit: Payer: Self-pay | Admitting: Oncology

## 2020-05-04 ENCOUNTER — Other Ambulatory Visit (HOSPITAL_COMMUNITY): Payer: Self-pay | Admitting: Family Medicine

## 2020-05-04 ENCOUNTER — Encounter (INDEPENDENT_AMBULATORY_CARE_PROVIDER_SITE_OTHER): Payer: Self-pay | Admitting: Physician Assistant

## 2020-05-04 VITALS — BP 124/81 | HR 84 | Temp 99.1°F | Ht 67.0 in | Wt 243.0 lb

## 2020-05-04 DIAGNOSIS — R7303 Prediabetes: Secondary | ICD-10-CM

## 2020-05-04 DIAGNOSIS — E559 Vitamin D deficiency, unspecified: Secondary | ICD-10-CM | POA: Diagnosis not present

## 2020-05-04 DIAGNOSIS — Z9189 Other specified personal risk factors, not elsewhere classified: Secondary | ICD-10-CM | POA: Diagnosis not present

## 2020-05-04 DIAGNOSIS — Z6838 Body mass index (BMI) 38.0-38.9, adult: Secondary | ICD-10-CM

## 2020-05-04 MED ORDER — METFORMIN HCL 500 MG PO TABS: 500.0000 mg | ORAL_TABLET | Freq: Two times a day (BID) | ORAL | 0 refills | Status: DC

## 2020-05-04 MED FILL — TRIAMTERENE-HCTZ 37.5-25 MG: 37.5-25 | 90 days supply | Qty: 90 | Fill #0

## 2020-05-04 MED FILL — ANASTROZOLE 1 MG TABLET: 1 | 30 days supply | Qty: 30 | Fill #0

## 2020-05-04 NOTE — Progress Notes (Signed)
Chief Complaint:   OBESITY Candace [name] is here to discuss her progress with her obesity treatment plan along with follow-up of her obesity related diagnoses. Candace [name] is on the Category 4 Plan and states she is following her eating plan approximately 80% of the time. Candace [name] states she is walking 45 minutes 2 times per week.  Today's visit was #: 47 Starting weight: 249 lbs Starting date: 09/17/2017 Today's weight: 243 lbs Today's date: 05/04/2020 Total lbs lost to date: 6 Total lbs lost since last in-office visit: 2  Interim History: Candace [name] states that after her last visit she felt down about her lab work. She has been using more Splenda in her baking. She is on 1.8 mg of Saxenda, which has decreased her appetite.  Subjective:   Prediabetes. Candace [name] has a diagnosis of prediabetes based on her elevated HgA1c and was informed this puts her at greater risk of developing diabetes. She continues to work on diet and exercise to decrease her risk of diabetes. She denies nausea or hypoglycemia. Candace [name] is on metformin and Saxenda. She denies polyphagia.  Lab Results  Component Value Date   HGBA1C 6.2 (H) 04/04/2020   Lab Results  Component Value Date   INSULIN 22.9 04/04/2020   INSULIN 12.6 09/08/2019   INSULIN 23.8 02/04/2019   INSULIN 20.6 08/06/2018   INSULIN 15.2 02/18/2018   Vitamin D deficiency. Candace [name] is on Vitamin D 5,000 units twice weekly. She states she is walking outside for exercise.   Ref. Range 04/04/2020 08:10  Vitamin D, 25-Hydroxy Latest Ref Range: 30.0 - 100.0 ng/mL 41.2   At risk for heart disease. Candace [name] is at a higher than average risk for cardiovascular disease due to obesity.   Assessment/Plan:   Prediabetes. Candace [name] will continue to work on weight loss, exercise, and decreasing simple carbohydrates to help decrease the risk of diabetes. Refill was given for metFORMIN (GLUCOPHAGE) 500 MG tablet #60 with 0 refills.  Vitamin D deficiency. Low Vitamin D level  contributes to fatigue and are associated with obesity, breast, and colon cancer. She agrees to continue to take Vitamin D as directed and will follow-up for routine testing of Vitamin D, at least 2-3 times per year to avoid over-replacement.  At risk for heart disease. Candace [name] was given approximately 15 minutes of coronary artery disease prevention counseling today. She is 59 y.o. female and has risk factors for heart disease including obesity. We discussed intensive lifestyle modifications today with an emphasis on specific weight loss instructions and strategies.   Repetitive spaced learning was employed today to elicit superior memory formation and behavioral change.  Class 2 severe obesity with serious comorbidity and body mass index (BMI) of 38.0 to 38.9 in adult, unspecified obesity type ([place]).  Candace [name] is currently in the action stage of change. As such, her goal is to continue with weight loss efforts. She has agreed to the Category 4 Plan and will journal 550-700 calories and 45 grams of protein at supper.   Exercise goals: For substantial health benefits, adults should do at least 150 minutes (2 hours and 30 minutes) a week of moderate-intensity, or 75 minutes (1 hour and 15 minutes) a week of vigorous-intensity aerobic physical activity, or an equivalent combination of moderate- and vigorous-intensity aerobic activity. Aerobic activity should be performed in episodes of at least 10 minutes, and preferably, it should be spread throughout the week.  Behavioral modification strategies: no skipping meals and holiday eating strategies .  Henretter has agreed to  follow-up with our clinic in 2 weeks. She was informed of the importance of frequent follow-up visits to maximize her success with intensive lifestyle modifications for her multiple health conditions.   Objective:   Blood pressure 124/81, pulse 84, temperature 99.1 F (37.3 C), height 5\' 7"  (1.702 m), weight 243 lb (110.2 kg), SpO2 96  %. Body mass index is 38.06 kg/m.  General: Cooperative, alert, well developed, in no acute distress. HEENT: Conjunctivae and lids unremarkable. Cardiovascular: Regular rhythm.  Lungs: Normal work of breathing. Neurologic: No focal deficits.   Lab Results  Component Value Date   CREATININE 0.75 04/04/2020   BUN 16 04/04/2020   NA 140 04/04/2020   K 4.2 04/04/2020   CL 100 04/04/2020   CO2 27 04/04/2020   Lab Results  Component Value Date   ALT 28 04/04/2020   AST 21 04/04/2020   ALKPHOS 86 04/04/2020   BILITOT 0.3 04/04/2020   Lab Results  Component Value Date   HGBA1C 6.2 (H) 04/04/2020   HGBA1C 6.0 (H) 09/08/2019   HGBA1C 6.2 (H) 02/04/2019   HGBA1C 5.9 (H) 08/06/2018   HGBA1C 5.9 (H) 02/18/2018   Lab Results  Component Value Date   INSULIN 22.9 04/04/2020   INSULIN 12.6 09/08/2019   INSULIN 23.8 02/04/2019   INSULIN 20.6 08/06/2018   INSULIN 15.2 02/18/2018   Lab Results  Component Value Date   TSH 2.390 09/17/2017   Lab Results  Component Value Date   CHOL 187 04/04/2020   HDL 48 04/04/2020   LDLCALC 95 04/04/2020   TRIG 262 (H) 04/04/2020   CHOLHDL 3.9 04/04/2020   Lab Results  Component Value Date   WBC 7.1 04/15/2019   HGB 13.4 04/15/2019   HCT 41.4 04/15/2019   MCV 83.8 04/15/2019   PLT 297 04/15/2019   No results found for: IRON, TIBC, FERRITIN  Attestation Statements:   Reviewed by clinician on day of visit: allergies, medications, problem list, medical history, surgical history, family history, social history, and previous encounter notes.  IMichaelene Song, am acting as transcriptionist for [name], PA-C   I have reviewed the above documentation for accuracy and completeness, and I agree with the above. [name], PA-C

## 2020-05-09 ENCOUNTER — Telehealth (INDEPENDENT_AMBULATORY_CARE_PROVIDER_SITE_OTHER): Payer: 59 | Admitting: Psychology

## 2020-05-09 DIAGNOSIS — F5089 Other specified eating disorder: Secondary | ICD-10-CM | POA: Diagnosis not present

## 2020-05-18 ENCOUNTER — Ambulatory Visit (INDEPENDENT_AMBULATORY_CARE_PROVIDER_SITE_OTHER): Payer: 59 | Admitting: Physician Assistant

## 2020-05-18 ENCOUNTER — Encounter (INDEPENDENT_AMBULATORY_CARE_PROVIDER_SITE_OTHER): Payer: Self-pay | Admitting: Physician Assistant

## 2020-05-18 ENCOUNTER — Other Ambulatory Visit: Payer: Self-pay

## 2020-05-18 VITALS — BP 154/87 | HR 95 | Temp 98.5°F | Ht 67.0 in | Wt 241.0 lb

## 2020-05-18 DIAGNOSIS — Z9189 Other specified personal risk factors, not elsewhere classified: Secondary | ICD-10-CM

## 2020-05-18 DIAGNOSIS — Z6838 Body mass index (BMI) 38.0-38.9, adult: Secondary | ICD-10-CM

## 2020-05-18 DIAGNOSIS — Z6837 Body mass index (BMI) 37.0-37.9, adult: Secondary | ICD-10-CM | POA: Diagnosis not present

## 2020-05-18 DIAGNOSIS — E7849 Other hyperlipidemia: Secondary | ICD-10-CM | POA: Diagnosis not present

## 2020-05-18 DIAGNOSIS — R7303 Prediabetes: Secondary | ICD-10-CM | POA: Diagnosis not present

## 2020-05-18 DIAGNOSIS — G4733 Obstructive sleep apnea (adult) (pediatric): Secondary | ICD-10-CM | POA: Diagnosis not present

## 2020-05-23 NOTE — Progress Notes (Signed)
Chief Complaint:   OBESITY Candace [name] is here to discuss her progress with her obesity treatment plan along with follow-up of her obesity related diagnoses. Candace [name] is on the Category 4 Plan and states she is following her eating plan approximately 75% of the time. Candace [name] states she is walking 20 minutes 4 times per week.  Today's visit was #: 39 Starting weight: 249 lbs Starting date: 09/17/2017 Today's weight: 241 lbs Today's date: 05/18/2020 Total lbs lost to date: 8 Total lbs lost since last in-office visit: 2  Interim History: Candace [name] has been looking up a lot of crockpot recipes for dinner. She is on Saxenda 1.8 mg and tolerating well, but states she cannot eat all of the food on the plan.  Subjective:   Prediabetes. Candace [name] has a diagnosis of prediabetes based on her elevated HgA1c and was informed this puts her at greater risk of developing diabetes. She continues to work on diet and exercise to decrease her risk of diabetes. Candace [name] is on metformin. No nausea, vomiting, diarrhea, or polyphagia. She states she cannot eat all of the food on the plan.  Lab Results  Component Value Date   HGBA1C 6.2 (H) 04/04/2020   Lab Results  Component Value Date   INSULIN 22.9 04/04/2020   INSULIN 12.6 09/08/2019   INSULIN 23.8 02/04/2019   INSULIN 20.6 08/06/2018   INSULIN 15.2 02/18/2018   Other hyperlipidemia. Candace [name] is on Crestor. She is managed by her PCP.   Lab Results  Component Value Date   CHOL 187 04/04/2020   HDL 48 04/04/2020   LDLCALC 95 04/04/2020   TRIG 262 (H) 04/04/2020   CHOLHDL 3.9 04/04/2020   Lab Results  Component Value Date   ALT 28 04/04/2020   AST 21 04/04/2020   ALKPHOS 86 04/04/2020   BILITOT 0.3 04/04/2020   The 10-year ASCVD risk score Candace Bussing DC Jr., et al., 2013) is: 6%   Values used to calculate the score:     Age: 59 years     Sex: Female     Is Non-Hispanic African American: No     Diabetic: No     Tobacco smoker: No     Systolic  Blood Pressure: 154 mmHg     Is BP treated: Yes     HDL Cholesterol: 48 mg/dL     Total Cholesterol: 187 mg/dL  At risk for diabetes mellitus. Treena is at higher than average risk for developing diabetes due to obesity.   Assessment/Plan:   Prediabetes. Candace [name] will continue to work on weight loss, exercise, and decreasing simple carbohydrates to help decrease the risk of diabetes. Refill was given for metFORMIN (GLUCOPHAGE) 500 MG tablet #60 with 0 refills.  Other hyperlipidemia. Cardiovascular risk and specific lipid/LDL goals reviewed.  We discussed several lifestyle modifications today and Candace [name] will continue to work on diet, exercise and weight loss efforts. Orders and follow up as documented in patient record. She will follow-up with her PCP as scheduled. Lipids will be monitored.  Counseling Intensive lifestyle modifications are the first line treatment for this issue. . Dietary changes: Increase soluble fiber. Decrease simple carbohydrates. . Exercise changes: Moderate to vigorous-intensity aerobic activity 150 minutes per week if tolerated. . Lipid-lowering medications: see documented in medical record.  At risk for diabetes mellitus. Candace [name] was given approximately 15 minutes of diabetes education and counseling today. We discussed intensive lifestyle modifications today with an emphasis on weight loss as well as increasing exercise and decreasing simple  carbohydrates in her diet. We also reviewed medication options with an emphasis on risk versus benefit of those discussed.   Repetitive spaced learning was employed today to elicit superior memory formation and behavioral change.  Class 2 severe obesity with serious comorbidity and body mass index (BMI) of 38.0 to 38.9 in adult, unspecified obesity type ([place]). Refill was given for Liraglutide - Weight Management (SAXENDA) 18 MG/3ML SOPN #5 with 0 refills.  Candace [name] is currently in the action stage of change. As such, her goal is to  continue with weight loss efforts. She has agreed to keeping a food journal and adhering to recommended goals of 1500-1700 calories and 100 grams of protein daily.   Exercise goals: For substantial health benefits, adults should do at least 150 minutes (2 hours and 30 minutes) a week of moderate-intensity, or 75 minutes (1 hour and 15 minutes) a week of vigorous-intensity aerobic physical activity, or an equivalent combination of moderate- and vigorous-intensity aerobic activity. Aerobic activity should be performed in episodes of at least 10 minutes, and preferably, it should be spread throughout the week.  Behavioral modification strategies: meal planning and cooking strategies and holiday eating strategies .  Candace [name] has agreed to follow-up with our clinic in 2 weeks. She was informed of the importance of frequent follow-up visits to maximize her success with intensive lifestyle modifications for her multiple health conditions.   Objective:   Blood pressure (!) 154/87, pulse 95, temperature 98.5 F (36.9 C), height 5\' 7"  (1.702 m), weight 241 lb (109.3 kg), SpO2 97 %. Body mass index is 37.75 kg/m.  General: Cooperative, alert, well developed, in no acute distress. HEENT: Conjunctivae and lids unremarkable. Cardiovascular: Regular rhythm.  Lungs: Normal work of breathing. Neurologic: No focal deficits.   Lab Results  Component Value Date   CREATININE 0.75 04/04/2020   BUN 16 04/04/2020   NA 140 04/04/2020   K 4.2 04/04/2020   CL 100 04/04/2020   CO2 27 04/04/2020   Lab Results  Component Value Date   ALT 28 04/04/2020   AST 21 04/04/2020   ALKPHOS 86 04/04/2020   BILITOT 0.3 04/04/2020   Lab Results  Component Value Date   HGBA1C 6.2 (H) 04/04/2020   HGBA1C 6.0 (H) 09/08/2019   HGBA1C 6.2 (H) 02/04/2019   HGBA1C 5.9 (H) 08/06/2018   HGBA1C 5.9 (H) 02/18/2018   Lab Results  Component Value Date   INSULIN 22.9 04/04/2020   INSULIN 12.6 09/08/2019   INSULIN 23.8  02/04/2019   INSULIN 20.6 08/06/2018   INSULIN 15.2 02/18/2018   Lab Results  Component Value Date   TSH 2.390 09/17/2017   Lab Results  Component Value Date   CHOL 187 04/04/2020   HDL 48 04/04/2020   LDLCALC 95 04/04/2020   TRIG 262 (H) 04/04/2020   CHOLHDL 3.9 04/04/2020   Lab Results  Component Value Date   WBC 7.1 04/15/2019   HGB 13.4 04/15/2019   HCT 41.4 04/15/2019   MCV 83.8 04/15/2019   PLT 297 04/15/2019   No results found for: IRON, TIBC, FERRITIN  Attestation Statements:   Reviewed by clinician on day of visit: allergies, medications, problem list, medical history, surgical history, family history, social history, and previous encounter notes.  IMichaelene Song, am acting as transcriptionist for [name], PA-C   I have reviewed the above documentation for accuracy and completeness, and I agree with the above. [name], PA-C

## 2020-05-23 NOTE — Progress Notes (Signed)
[facility]  [phone] [phone]     ID: Candace [name] DOB: Mar 25, 1961  MR#: [id number]  GBE#:[id number]  Patient Care Team: Laurann Montana, MD as PCP - General (Family Medicine) Antionetta Ator, Valentino Hue, MD as Consulting Physician (Oncology) Emelia Loron, MD as Consulting Physician (General Surgery) Haygood, Maris Berger, MD (Inactive) as Consulting Physician (Obstetrics and Gynecology) Bernette Redbird, MD as Consulting Physician (Gastroenterology) Arminda Resides, MD as Consulting Physician (Dermatology) Axel Filler, Larna Daughters, NP as Nurse Practitioner (Hematology and Oncology) OTHER MD:   CHIEF COMPLAINT: Estrogen receptor positive breast cancer   CURRENT TREATMENT: Anastrozole   INTERVAL HISTORY: Candace [name] was scheduled today for follow-up of her estrogen receptor positive breast cancer, however she did not show    Candace [name]'s last bone density screening on 04/08/2019, showed a T-score of 0.3, which is considered Normal.    Since her last visit, she underwent bilateral diagnostic mammography with tomography and left breast ultrasonography [facility] on 04/28/2020 showing: breast density category B; probably-benign 1.2 cm left breast mass in subareolar aspect, likely corresponding with a focal ductal outpouching in setting of multiple other dilated ducts; no evidence of right breast malignancy.   She is scheduled for short-term follow up of the mass on 10/25/2020.   REVIEW OF SYSTEMS: Candace [name]    COVID 19 VACCINATION STATUS:    BREAST CANCER HISTORY: From the original intake note:  Candace [name] had routine [facility]. On 04/04/2016 she underwent right diagnostic mammography and tomography with right breast ultrasonography. The breast density was category B. In the upper outer quadrant of the breast there was a mass measuring 0.7 cm, with irregular margins. By ultrasonography this was  located at the 10:00 position 9 cm from the nipple measuring 0.6 cm. Right axillary ultrasound was negative.  Biopsy of the right breast mass in question 04/05/2016 showed ([id number]) and invasive lobular carcinoma, grade 2 or 3, E-cadherin negative, estrogen receptor 90% positive, progesterone receptor 5% positive, both with strong staining intensity, with an MIB-1 of 20%, and no HER-2 amplification, the signals ratio being 0.82 and the number per cell 1.60.  Bilateral breast MRI was obtained 04/23/2016. This confirmed a 0.6 cm nodule in the upper-outer quadrant of the right breast with no additional sites of concern in either breast and no abnormal appearing lymph nodes.  Her subsequent history is as detailed below.   PAST MEDICAL HISTORY: Past Medical History:  Diagnosis Date  . Abnormal Pap smear   . Alcohol abuse   . Anovulation   . Anxiety   . Breast cancer (HCC) 04/05/2016   right breast  . Cancer (HCC) 2017   right breast cancer  . Complication of anesthesia    slow to wake up  . Depression   . Endometrial hyperplasia   . Endometrial polyp    h/o  . Epidermal cyst    h/o  . Gallbladder problem   . GERD (gastroesophageal reflux disease)   . H/O hemorrhoids   . H/O menorrhagia   . High risk HPV infection   . Hyperlipidemia   . Insomnia   . Obesity   . Oligomenorrhea   . Prediabetes   . Restless legs syndrome   . Seasonal allergies   . Vitamin D deficiency     PAST SURGICAL HISTORY: Past Surgical History:  Procedure Laterality Date  . BREAST LUMPECTOMY Right 05/2016   radiation  . BREAST LUMPECTOMY WITH RADIOACTIVE SEED AND SENTINEL LYMPH NODE  BIOPSY Right 05/28/2016   Procedure: RADIOACTIVE SEED GUIDED RIGHT BREAST LUMPECTOMY WITH  RIGHT AXILLARY SENTINEL LYMPH NODE BIOPSY;  Surgeon: [name], MD;  Location: [facility];  Service: General;  Laterality: Right;  . CARPAL TUNNEL RELEASE  03/26/2011   right hand  . CARPAL TUNNEL RELEASE  05/28/2011    Procedure: CARPAL TUNNEL RELEASE;  Surgeon: [name];  Location: [facility];  Service: Orthopedics;  Laterality: Left;  . CHOLECYSTECTOMY N/A 01/24/2013   Procedure: LAPAROSCOPIC CHOLECYSTECTOMY WITH INTRAOPERATIVE CHOLANGIOGRAM;  Surgeon: [name], MD;  Location: [place];  Service: General;  Laterality: N/A;  . COLONOSCOPY    . HYSTEROSCOPY    . POLYPECTOMY    . TONSILLECTOMY  1967   as child  . trigger fingers  8 yrs. ago   both done months apart  . uterine ablation  06/04/2010  . uterine ablation      FAMILY HISTORY Family History  Problem Relation Age of Onset  . Heart disease Mother   . Hypertension Mother   . Stroke Mother   . Dementia Mother        vascular dementia  . Hyperlipidemia Mother   . Depression Mother   . Anxiety disorder Mother   . Alcohol abuse Mother   . Obesity Mother   . Pulmonary fibrosis Father        d. 21  . Depression Father   . Anxiety disorder Father   . Alcohol abuse Father   . Heart attack Maternal Grandmother        d. [phone]  . Heart disease Maternal Grandmother   . Heart disease Maternal Grandfather        d. 60-63  . Leukemia Paternal Grandmother        d. late 77s  . Breast cancer Sister 36       IDC s/p mastectomy and chemo  Both of the patient's parents were only children. Her father died at age 43 from idiopathic pulmonary fibrosis. The patient's mother died at the age of 26 in the setting of dementia. The patient has one brother and one sister. Her sister was diagnosed at age 40 with breast cancer (a T1b invasive ductal carcinoma just behind the nipple, requiring mastectomy. Her Oncotype was 24, and she received chemotherapy 3. She is now doing well).   GYNECOLOGIC HISTORY:  No LMP recorded. Patient has had an ablation. Menarche age 50, first live birth age ? She is GX P2. She stopped having periods December 2012 when she underwent endometrial ablation. She used hormone replacement for more than 10 years remotely,  without complications   SOCIAL HISTORY: (Updated May 2020). 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 She is divorced and lives at home with her daughters  Her children 70, age 39, and [name], age 4, are doing well with online schoolwork. she had 2 dogs. The patient was brought up as an [place].    ADVANCED DIRECTIVES: In place. The patient's sister Candace [name] is her healthcare part of attorney   HEALTH MAINTENANCE: Social History   Tobacco Use  . Smoking status: Never Smoker  . Smokeless tobacco: Never Used  Substance Use Topics  . Alcohol use: No    Comment: none since 1996  . Drug use: No     Colonoscopy:  PAP:  Bone density: 03/28/2017 showed a T score of 0.8 normal   No Known Allergies  Current Outpatient Medications  Medication Sig Dispense Refill  .  anastrozole (ARIMIDEX) 1 MG tablet TAKE 1 TABLET (1 MG TOTAL) BY MOUTH DAILY. 30 tablet 0  . buPROPion (WELLBUTRIN XL) 300 MG 24 hr tablet Take 300 mg by mouth daily.     . cetirizine (ZYRTEC) 10 MG tablet Take 10 mg by mouth daily. [facility]    . Cholecalciferol (VITAMIN D-3) 125 MCG (5000 UT) TABS Take 5,000 Units by mouth daily. (Patient taking differently: Take 50 mg by mouth daily. ) 30 tablet   . esomeprazole (NEXIUM) 40 MG capsule Take 40 mg by mouth every 36 (thirty-six) hours.    [name] FLUoxetine (PROZAC) 10 MG capsule Take 10 mg by mouth daily with lunch.     . Insulin Pen Needle (BD PEN NEEDLE NANO 2ND GEN) 32G X 4 MM MISC 1 Package by Does not apply route daily. 100 each 0  . Liraglutide -Weight Management (SAXENDA) 18 MG/3ML SOPN Inject 0.1 mLs (0.6 mg total) into the skin daily. 15 mL 0  . metFORMIN (GLUCOPHAGE) 500 MG tablet Take 1 tablet (500 mg total) by mouth 2 (two) times daily with a meal. 60 tablet 0  . Multiple Vitamin (MULTIVITAMIN WITH MINERALS) TABS tablet Take 1 tablet by mouth daily.    [name] rOPINIRole (REQUIP) 0.25 MG tablet  Take 0.5 mg by mouth daily at 8 pm.    . rosuvastatin (CRESTOR) 10 MG tablet Take 10 mg by mouth at bedtime.    . triamterene-hydrochlorothiazide (MAXZIDE-25) 37.5-25 MG tablet Take 1 tablet by mouth daily.     No current facility-administered medications for this visit.    OBJECTIVE:   There were no vitals filed for this visit. Wt Readings from Last 3 Encounters:  05/18/20 241 lb (109.3 kg)  05/04/20 243 lb (110.2 kg)  04/20/20 245 lb (111.1 kg)   There is no height or weight on file to calculate BMI.        LAB RESULTS:  CMP     Component Value Date/Time   NA 140 04/04/2020 0810   NA 141 04/16/2017 1409   K 4.2 04/04/2020 0810   K 3.8 04/16/2017 1409   CL 100 04/04/2020 0810   CO2 27 04/04/2020 0810   CO2 25 04/16/2017 1409   GLUCOSE 113 (H) 04/04/2020 0810   GLUCOSE 120 (H) 04/15/2019 0941   GLUCOSE 134 04/16/2017 1409   BUN 16 04/04/2020 0810   BUN 12.4 04/16/2017 1409   CREATININE 0.75 04/04/2020 0810   CREATININE 0.87 04/15/2019 0941   CREATININE 0.9 04/16/2017 1409   CALCIUM 9.8 04/04/2020 0810   CALCIUM 9.6 04/16/2017 1409   PROT 6.7 04/04/2020 0810   PROT 7.0 04/16/2017 1409   ALBUMIN 4.5 04/04/2020 0810   ALBUMIN 4.0 04/16/2017 1409   AST 21 04/04/2020 0810   AST 17 04/15/2019 0941   AST 29 04/16/2017 1409   ALT 28 04/04/2020 0810   ALT 19 04/15/2019 0941   ALT 34 04/16/2017 1409   ALKPHOS 86 04/04/2020 0810   ALKPHOS 74 04/16/2017 1409   BILITOT 0.3 04/04/2020 0810   BILITOT 0.3 04/15/2019 0941   BILITOT 0.40 04/16/2017 1409   GFRNONAA 88 04/04/2020 0810   GFRNONAA >60 04/15/2019 0941   GFRAA 102 04/04/2020 0810   GFRAA >60 04/15/2019 0941    INo results found for: SPEP, UPEP  Lab Results  Component Value Date   WBC 7.1 04/15/2019   NEUTROABS 4.0 04/15/2019   HGB 13.4 04/15/2019   HCT 41.4 04/15/2019   MCV 83.8 04/15/2019   PLT  297 04/15/2019      Chemistry      Component Value Date/Time   NA 140 04/04/2020 0810   NA 141  04/16/2017 1409   K 4.2 04/04/2020 0810   K 3.8 04/16/2017 1409   CL 100 04/04/2020 0810   CO2 27 04/04/2020 0810   CO2 25 04/16/2017 1409   BUN 16 04/04/2020 0810   BUN 12.4 04/16/2017 1409   CREATININE 0.75 04/04/2020 0810   CREATININE 0.87 04/15/2019 0941   CREATININE 0.9 04/16/2017 1409      Component Value Date/Time   CALCIUM 9.8 04/04/2020 0810   CALCIUM 9.6 04/16/2017 1409   ALKPHOS 86 04/04/2020 0810   ALKPHOS 74 04/16/2017 1409   AST 21 04/04/2020 0810   AST 17 04/15/2019 0941   AST 29 04/16/2017 1409   ALT 28 04/04/2020 0810   ALT 19 04/15/2019 0941   ALT 34 04/16/2017 1409   BILITOT 0.3 04/04/2020 0810   BILITOT 0.3 04/15/2019 0941   BILITOT 0.40 04/16/2017 1409       No results found for: LABCA2  No components found for: LABCA125  No results for input(s): INR in the last 168 hours.  Urinalysis    Component Value Date/Time   COLORURINE YELLOW 01/24/2013 0327   APPEARANCEUR TURBID (A) 01/24/2013 0327   LABSPEC 1.021 01/24/2013 0327   PHURINE 7.5 01/24/2013 0327   GLUCOSEU NEGATIVE 01/24/2013 0327   HGBUR NEGATIVE 01/24/2013 0327   BILIRUBINUR NEGATIVE 01/24/2013 0327   KETONESUR 15 (A) 01/24/2013 0327   PROTEINUR NEGATIVE 01/24/2013 0327   UROBILINOGEN 0.2 01/24/2013 0327   NITRITE NEGATIVE 01/24/2013 0327   LEUKOCYTESUR NEGATIVE 01/24/2013 0327    STUDIES: US BREAST LTD UNI LEFT INC AXILLA  Result Date: 04/28/2020 CLINICAL DATA:  59 year old female status post malignant right lumpectomy with radiation therapy in 2017. No current breast related issues. EXAM: DIGITAL DIAGNOSTIC BILATERAL MAMMOGRAM WITH CAD AND TOMO ULTRASOUND LEFT BREAST COMPARISON:  Previous exam(s). ACR Breast Density Category b: There are scattered areas of fibroglandular density. FINDINGS: Stable post lumpectomy changes are noted in the upper right breast. No new or suspicious mammographic findings are identified in the remainder of the right breast. There is been interval  development of an oval, circumscribed equal density mass in the slightly lower outer quadrant of the left breast anteriorly. Further evaluation with ultrasound was performed. Mammographic images were processed with CAD. Targeted ultrasound is performed, showing multiple mildly dilated ducts with a single area of focal ductal outpouching at the 4 o'clock position 1 cm from the nipple. It measures 1.2 x 1.0 x 0.4 cm. There is no internal vascularity or suggestion of intraductal mass. IMPRESSION: 1. Probably benign left breast mass in the subareolar aspect, likely corresponding with a focal ductal outpouching in the setting of multiple other dilated ducts. Recommendation is for short-term interval follow-up. 2. Stable right breast post biopsy changes without mammographic evidence of malignancy. RECOMMENDATION: Diagnostic left breast mammogram and ultrasound in 6 months. I have discussed the findings and recommendations with the patient. If applicable, a reminder letter will be sent to the patient regarding the next appointment. BI-RADS CATEGORY  3: Probably benign. Electronically Signed   By: Kristopher Oppenheim M.D.   On: 04/28/2020 09:39   MM DIAG BREAST TOMO BILATERAL  Result Date: 04/28/2020 CLINICAL DATA:  59 year old female status post malignant right lumpectomy with radiation therapy in 2017. No current breast related issues. EXAM: DIGITAL DIAGNOSTIC BILATERAL MAMMOGRAM WITH CAD AND TOMO ULTRASOUND LEFT BREAST COMPARISON:  Previous exam(s). ACR Breast Density Category b: There are scattered areas of fibroglandular density. FINDINGS: Stable post lumpectomy changes are noted in the upper right breast. No new or suspicious mammographic findings are identified in the remainder of the right breast. There is been interval development of an oval, circumscribed equal density mass in the slightly lower outer quadrant of the left breast anteriorly. Further evaluation with ultrasound was performed. Mammographic images were  processed with CAD. Targeted ultrasound is performed, showing multiple mildly dilated ducts with a single area of focal ductal outpouching at the 4 o'clock position 1 cm from the nipple. It measures 1.2 x 1.0 x 0.4 cm. There is no internal vascularity or suggestion of intraductal mass. IMPRESSION: 1. Probably benign left breast mass in the subareolar aspect, likely corresponding with a focal ductal outpouching in the setting of multiple other dilated ducts. Recommendation is for short-term interval follow-up. 2. Stable right breast post biopsy changes without mammographic evidence of malignancy. RECOMMENDATION: Diagnostic left breast mammogram and ultrasound in 6 months. I have discussed the findings and recommendations with the patient. If applicable, a reminder letter will be sent to the patient regarding the next appointment. BI-RADS CATEGORY  3: Probably benign. Electronically Signed   By: Sande Brothers M.D.   On: 04/28/2020 09:39     ELIGIBLE FOR AVAILABLE RESEARCH PROTOCOL: no  ASSESSMENT: 59 y.o. BRCA negative [place] woman status post right breast upper outer quadrant biopsy 04/05/2016 for a clinical T1b N0, stage IA invasive lobular breast cancer, grade 2 or 3, estrogen and progesterone receptor positive, HER-2 not amplified, with an MIB-1 of 20%  (1) genetics testing 04/23/2016 through the Breast/Ovarian Cancer Panel offered by [facility] Basilio Cairo, MD)  found no deleterious mutations in  ATM, BARD1, BRCA1, BRCA2, BRIP1, CDH1, CHEK2, FANCC, MLH1, MSH2, MSH6, NBN, PALB2, PMS2, PTEN, RAD51C, RAD51D, TP53, and XRCC2.  This panel also includes deletion/duplication analysis (without sequencing) for one gene, EPCAM  (2) status post right lumpectomy and sentinel lymph node sampling 05/28/2016 for a pT1b pN0, stage IA invasive lobular breast cancer, grade 2, with negative margins  (3) Oncotype Dx score of 22 predicts a 10 year risk of recurrence outside the breast of 14% if the  patient's only systemic therapy is tamoxifen for 5 years. It predicts a benefit from chemotherapy in the 4% range.   (a) Patient opted against adjuvant chemotherapy   (4) adjuvant radiation 07/22/16-09/04/16 Site/dose:   1) Right breast/ 50.4 Gy in 28 fractions                         2) Right breast boost/ 10 Gy in 5 fractions  (5) started anastrozole 09/29/2016   (a) [facility] showed a T score of 0.8, normal   (b) repeat bone density 04/08/2019 showed a T score of 0.3 (normal).   PLAN: Jilliane did not show for her visit 04/17/2020 or 05/24/2020. A follow-up letter has been sent.   Dallen Bunte, Valentino Hue, MD  05/24/20 5:24 PM Medical Oncology and Hematology [facility] [address] [place], [place] [address] Tel. [phone]    Fax. [phone]   I, Mickie Bail, am acting as scribe for Dr. Valentino Hue. Hanadi Stanly.  I, Ruthann Cancer MD, have reviewed the above documentation for accuracy and completeness, and I agree with the above.   *Total Encounter Time as defined by the Centers for Medicare and Medicaid Services includes, in addition to the  face-to-face time of a patient visit (documented in the note above) non-face-to-face time: obtaining and reviewing outside history, ordering and reviewing medications, tests or procedures, care coordination (communications with other health care professionals or caregivers) and documentation in the medical record.

## 2020-05-24 ENCOUNTER — Inpatient Hospital Stay (HOSPITAL_BASED_OUTPATIENT_CLINIC_OR_DEPARTMENT_OTHER): Payer: 59 | Admitting: Oncology

## 2020-05-24 ENCOUNTER — Inpatient Hospital Stay: Payer: 59 | Attending: Oncology

## 2020-05-24 ENCOUNTER — Encounter: Payer: Self-pay | Admitting: Oncology

## 2020-05-24 ENCOUNTER — Other Ambulatory Visit (INDEPENDENT_AMBULATORY_CARE_PROVIDER_SITE_OTHER): Payer: Self-pay | Admitting: Physician Assistant

## 2020-05-24 DIAGNOSIS — Z170 Estrogen receptor positive status [ER+]: Secondary | ICD-10-CM

## 2020-05-24 DIAGNOSIS — C50411 Malignant neoplasm of upper-outer quadrant of right female breast: Secondary | ICD-10-CM

## 2020-05-24 MED FILL — METFORMIN HCL 500 MG TABS: 500 | 30 days supply | Qty: 60 | Fill #0

## 2020-05-24 MED FILL — rOPINIRole HCL 1 MG TABS: 1 | 90 days supply | Qty: 90 | Fill #2

## 2020-05-28 ENCOUNTER — Other Ambulatory Visit (INDEPENDENT_AMBULATORY_CARE_PROVIDER_SITE_OTHER): Payer: Self-pay | Admitting: Physician Assistant

## 2020-05-28 MED ORDER — METFORMIN HCL 500 MG PO TABS: 500.0000 mg | ORAL_TABLET | Freq: Two times a day (BID) | ORAL | 0 refills | Status: DC

## 2020-05-28 MED ORDER — SAXENDA 18 MG/3ML ~~LOC~~ SOPN: 0.6000 mg | PEN_INJECTOR | Freq: Every day | SUBCUTANEOUS | 0 refills | Status: DC

## 2020-05-29 MED FILL — SAXENDA 18 MG/3 ML PEN: 18 | 30 days supply | Qty: 15 | Fill #0

## 2020-05-29 NOTE — Telephone Encounter (Signed)
This patient was last seen by [name], PA-C, and currently has an upcoming appt scheduled on 06/07/20 with her.

## 2020-06-05 ENCOUNTER — Encounter (INDEPENDENT_AMBULATORY_CARE_PROVIDER_SITE_OTHER): Payer: Self-pay

## 2020-06-07 ENCOUNTER — Ambulatory Visit (INDEPENDENT_AMBULATORY_CARE_PROVIDER_SITE_OTHER): Payer: 59 | Admitting: Physician Assistant

## 2020-06-14 ENCOUNTER — Other Ambulatory Visit: Payer: Self-pay | Admitting: Oncology

## 2020-06-14 ENCOUNTER — Telehealth: Payer: Self-pay | Admitting: Oncology

## 2020-06-14 MED FILL — ANASTROZOLE 1 MG TABLET: 1 | 30 days supply | Qty: 30 | Fill #0

## 2020-06-14 MED FILL — ROSUVASTATIN CALCIUM 10 MG: 10 | 90 days supply | Qty: 90 | Fill #2

## 2020-06-14 NOTE — Telephone Encounter (Signed)
Scheduled appt per 12/15 pt request. Pt missed a few appts and request next available. Pt confirmed appt date and time.

## 2020-06-27 MED FILL — METFORMIN HCL 500 MG TABS: 500 | 30 days supply | Qty: 60 | Fill #0

## 2020-07-04 ENCOUNTER — Ambulatory Visit (INDEPENDENT_AMBULATORY_CARE_PROVIDER_SITE_OTHER): Payer: 59 | Admitting: Physician Assistant

## 2020-07-04 ENCOUNTER — Encounter (INDEPENDENT_AMBULATORY_CARE_PROVIDER_SITE_OTHER): Payer: Self-pay | Admitting: Physician Assistant

## 2020-07-04 ENCOUNTER — Other Ambulatory Visit: Payer: Self-pay

## 2020-07-04 VITALS — BP 139/85 | HR 91 | Temp 98.5°F | Ht 67.0 in | Wt 242.0 lb

## 2020-07-04 DIAGNOSIS — Z9189 Other specified personal risk factors, not elsewhere classified: Secondary | ICD-10-CM

## 2020-07-04 DIAGNOSIS — R7303 Prediabetes: Secondary | ICD-10-CM

## 2020-07-04 DIAGNOSIS — E559 Vitamin D deficiency, unspecified: Secondary | ICD-10-CM

## 2020-07-04 DIAGNOSIS — Z6837 Body mass index (BMI) 37.0-37.9, adult: Secondary | ICD-10-CM | POA: Diagnosis not present

## 2020-07-05 ENCOUNTER — Encounter (INDEPENDENT_AMBULATORY_CARE_PROVIDER_SITE_OTHER): Payer: Self-pay

## 2020-07-05 NOTE — Progress Notes (Signed)
Chief Complaint:   OBESITY Candace [name] is here to discuss her progress with her obesity treatment plan along with follow-up of her obesity related diagnoses. Candace [name] is on keeping a food journal and adhering to recommended goals of 1500-1700 calories and 100 grams of protein daily and states she is following her eating plan approximately 70% of the time. Candace [name] states she is walking for 20 minutes 4 times per week.  Today's visit was #: 51 Starting weight: 249 lbs Starting date: 09/17/2017 Today's weight: 242 lbs Today's date: 07/04/2020 Total lbs lost to date: 7 Total lbs lost since last in-office visit: 0  Interim History: Candace [name] is on 1.8 mg Saxenda and reports that she is not hungry between meals. She is not drinking enough water. She is struggling to find a rhythm with her meals and eating schedule and sometimes skips a meal.  Subjective:   1. Vitamin D deficiency Candace [name] is not on medications, and her last Vit D level was not at goal.  2. Pre-diabetes Candace [name] is on metformin, and she denies polyphagia or hypoglycemia.  3. At risk for hypoglycemia Candace [name] is at increased risk for hypoglycemia due to changes in diet, diagnosis of diabetes, and/or insulin use.  Assessment/Plan:   1. Vitamin D deficiency Low Vitamin D level contributes to fatigue and are associated with obesity, breast, and colon cancer. We will recheck her Vit D level in the next month. [name] will follow-up for routine testing of Vitamin D, at least 2-3 times per year to avoid over-replacement.  2. Pre-diabetes Candace [name] will continue her medications and meal plan, and will continue to work on weight loss, exercise, and decreasing simple carbohydrates to help decrease the risk of diabetes.   3. At risk for hypoglycemia Candace [name] was given approximately 15 minutes of counseling today regarding prevention of hypoglycemia. She was advised of symptoms of hypoglycemia. Candace [name] was instructed to avoid skipping meals, eat  regular protein rich meals and schedule low calorie snacks as needed.   Repetitive spaced learning was employed today to elicit superior memory formation and behavioral change  4. Class 2 severe obesity with serious comorbidity and body mass index (BMI) of 37.0 to 37.9 in adult, unspecified obesity type (HCC) Candace [name] is currently in the action stage of change. As such, her goal is to continue with weight loss efforts. She has agreed to the Category 4 Plan.   Exercise goals: As is.  Behavioral modification strategies: no skipping meals and meal planning and cooking strategies.  Candace [name] has agreed to follow-up with our clinic in 2 weeks. She was informed of the importance of frequent follow-up visits to maximize her success with intensive lifestyle modifications for her multiple health conditions.   Objective:   Blood pressure 139/85, pulse 91, temperature 98.5 F (36.9 C), height 5\' 7"  (1.702 m), weight 242 lb (109.8 kg), SpO2 96 %. Body mass index is 37.9 kg/m.  General: Cooperative, alert, well developed, in no acute distress. HEENT: Conjunctivae and lids unremarkable. Cardiovascular: Regular rhythm.  Lungs: Normal work of breathing. Neurologic: No focal deficits.   Lab Results  Component Value Date   CREATININE 0.75 04/04/2020   BUN 16 04/04/2020   NA 140 04/04/2020   K 4.2 04/04/2020   CL 100 04/04/2020   CO2 27 04/04/2020   Lab Results  Component Value Date   ALT 28 04/04/2020   AST 21 04/04/2020   ALKPHOS 86 04/04/2020   BILITOT 0.3 04/04/2020   Lab Results  Component Value Date  HGBA1C 6.2 (H) 04/04/2020   HGBA1C 6.0 (H) 09/08/2019   HGBA1C 6.2 (H) 02/04/2019   HGBA1C 5.9 (H) 08/06/2018   HGBA1C 5.9 (H) 02/18/2018   Lab Results  Component Value Date   INSULIN 22.9 04/04/2020   INSULIN 12.6 09/08/2019   INSULIN 23.8 02/04/2019   INSULIN 20.6 08/06/2018   INSULIN 15.2 02/18/2018   Lab Results  Component Value Date   TSH 2.390 09/17/2017   Lab Results   Component Value Date   CHOL 187 04/04/2020   HDL 48 04/04/2020   LDLCALC 95 04/04/2020   TRIG 262 (H) 04/04/2020   CHOLHDL 3.9 04/04/2020   Lab Results  Component Value Date   WBC 7.1 04/15/2019   HGB 13.4 04/15/2019   HCT 41.4 04/15/2019   MCV 83.8 04/15/2019   PLT 297 04/15/2019   No results found for: IRON, TIBC, FERRITIN  Attestation Statements:   Reviewed by clinician on day of visit: allergies, medications, problem list, medical history, surgical history, family history, social history, and previous encounter notes.   Wilhemena Durie, am acting as transcriptionist for [employer], PA-C.  I have reviewed the above documentation for accuracy and completeness, and I agree with the above. [name], PA-C

## 2020-07-10 NOTE — Progress Notes (Signed)
[facility]  [phone] [phone]     ID: Candace [name] DOB: 1964-05-08  MR#: [id number]  DJT#:[id number]  Patient Care Team: [name], MD as PCP - General (Family Medicine) Shakayla Hickox, Virgie Dad, MD as Consulting Physician (Oncology) [name], MD as Consulting Physician (General Surgery) Haygood, Seymour Bars, MD (Inactive) as Consulting Physician (Obstetrics and Gynecology) Ronald Lobo, MD as Consulting Physician (Gastroenterology) Danella Sensing, MD as Consulting Physician (Dermatology) Delice Bison, Charlestine Massed, NP as Nurse Practitioner (Hematology and Oncology) OTHER MD:   CHIEF COMPLAINT: Estrogen receptor positive breast cancer   CURRENT TREATMENT: Anastrozole   INTERVAL HISTORY: Candace [name] returns today for follow-up of her estrogen receptor positive breast cancer.  She continues on anastrozole.  She has tolerated this remarkably well.  She tells me her sister was recently also started on anastrozole and has multiple aches and pains but [name] herself has some hip and knee issues which are not related to this medication.  [name]'s last bone density screening on 04/08/2019, showed a T-score of 0.3, which is considered normal.    Since her last visit, she underwent bilateral diagnostic mammography with tomography and left breast ultrasonography at [facility] on 04/28/2020 showing: breast density category B; probably-benign 1.2 cm left breast mass in subareolar aspect, likely corresponding with a focal ductal outpouching in setting of multiple other dilated ducts; no evidence of right breast malignancy.   She is scheduled for short-term follow up of the mass on 10/25/2020.   REVIEW OF SYSTEMS: [name] continues to go through the weight loss and exercise program and is greatly benefiting from it.  She had a quiet but enjoyable holiday.  She finds her work stressful as several people have retired and have not yet been replaced.   She is getting used to having an empty nest.  A detailed review of systems today was otherwise stable.  COVID 19 VACCINATION STATUS:    BREAST CANCER HISTORY: From the original intake note:  Candace [name] had routine screening mammography with tomography at the [facility] 03/29/2016. On 04/04/2016 she underwent right diagnostic mammography and tomography with right breast ultrasonography. The breast density was category B. In the upper outer quadrant of the breast there was a mass measuring 0.7 cm, with irregular margins. By ultrasonography this was located at the 10:00 position 9 cm from the nipple measuring 0.6 cm. Right axillary ultrasound was negative.  Biopsy of the right breast mass in question 04/05/2016 showed ([id number]) and invasive lobular carcinoma, grade 2 or 3, E-cadherin negative, estrogen receptor 90% positive, progesterone receptor 5% positive, both with strong staining intensity, with an MIB-1 of 20%, and no HER-2 amplification, the signals ratio being 0.82 and the number per cell 1.60.  Bilateral breast MRI was obtained 04/23/2016. This confirmed a 0.6 cm nodule in the upper-outer quadrant of the right breast with no additional sites of concern in either breast and no abnormal appearing lymph nodes.  Her subsequent history is as detailed below.   PAST MEDICAL HISTORY: Past Medical History:  Diagnosis Date  . Abnormal Pap smear   . Alcohol abuse   . Anovulation   . Anxiety   . Breast cancer ([facility]) 04/05/2016   right breast  . Cancer ([facility]) 2017   right breast cancer  . Complication of anesthesia    slow to wake up  . Depression   . Endometrial hyperplasia   . Endometrial polyp    h/o  . Epidermal cyst    h/o  .  Gallbladder problem   . GERD (gastroesophageal reflux disease)   . H/O hemorrhoids   . H/O menorrhagia   . High risk HPV infection   . Hyperlipidemia   . Insomnia   . Obesity   . Oligomenorrhea   . Prediabetes   . Restless legs syndrome   . Seasonal  allergies   . Vitamin D deficiency     PAST SURGICAL HISTORY: Past Surgical History:  Procedure Laterality Date  . BREAST LUMPECTOMY Right 05/2016   radiation  . BREAST LUMPECTOMY WITH RADIOACTIVE SEED AND SENTINEL LYMPH NODE BIOPSY Right 05/28/2016   Procedure: RADIOACTIVE SEED GUIDED RIGHT BREAST LUMPECTOMY WITH  RIGHT AXILLARY SENTINEL LYMPH NODE BIOPSY;  Surgeon: [name], MD;  Location: [facility];  Service: General;  Laterality: Right;  . CARPAL TUNNEL RELEASE  03/26/2011   right hand  . CARPAL TUNNEL RELEASE  05/28/2011   Procedure: CARPAL TUNNEL RELEASE;  Surgeon: [name];  Location: [facility];  Service: Orthopedics;  Laterality: Left;  . CHOLECYSTECTOMY N/A 01/24/2013   Procedure: LAPAROSCOPIC CHOLECYSTECTOMY WITH INTRAOPERATIVE CHOLANGIOGRAM;  Surgeon: [name], MD;  Location: [facility];  Service: General;  Laterality: N/A;  . COLONOSCOPY    . HYSTEROSCOPY    . POLYPECTOMY    . TONSILLECTOMY  1967   as child  . trigger fingers  8 yrs. ago   both done months apart  . uterine ablation  06/04/2010  . uterine ablation      FAMILY HISTORY Family History  Problem Relation Age of Onset  . Heart disease Mother   . Hypertension Mother   . Stroke Mother   . Dementia Mother        vascular dementia  . Hyperlipidemia Mother   . Depression Mother   . Anxiety disorder Mother   . Alcohol abuse Mother   . Obesity Mother   . Pulmonary fibrosis Father        d. 67  . Depression Father   . Anxiety disorder Father   . Alcohol abuse Father   . Heart attack Maternal Grandmother        d. [phone]  . Heart disease Maternal Grandmother   . Heart disease Maternal Grandfather        d. 60-63  . Leukemia Paternal Grandmother        d. late 46s  . Breast cancer Sister 46       IDC s/p mastectomy and chemo  Both of the patient's parents were only children. Her father died at age 68 from idiopathic pulmonary fibrosis. The patient's mother died at the age of 72 in  the setting of dementia. The patient has one brother and one sister. Her sister was diagnosed at age 1 with breast cancer (a T1b invasive ductal carcinoma just behind the nipple, requiring mastectomy. Her Oncotype was 24, and she received chemotherapy 3. She is now doing well).   GYNECOLOGIC HISTORY:  No LMP recorded. Patient has had an ablation. Menarche age 61, first live birth age ? She is GX P2. She stopped having periods December 2012 when she underwent endometrial ablation. 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 She is divorced and lives at home with her daughters  Her daughter Maretta Bees, age 68, is a sophomore at [employer], and [name], age 79, has been offered a scholarship and soccer at [employer].  The patient has 2 dogs.  The patient was brought up as an [place].    ADVANCED DIRECTIVES: In place. The patient's sister Halford Decamp is her healthcare part of attorney   HEALTH MAINTENANCE: Social History   Tobacco Use  . Smoking status: Never Smoker  . Smokeless tobacco: Never Used  Substance Use Topics  . Alcohol use: No    Comment: none since 1996  . Drug use: No     Colonoscopy:  PAP:  Bone density: 03/28/2017 showed a T score of 0.8 normal   No Known Allergies  Current Outpatient Medications  Medication Sig Dispense Refill  . anastrozole (ARIMIDEX) 1 MG tablet Take 1 tablet (1 mg total) by mouth daily. 90 tablet 6  . buPROPion (WELLBUTRIN XL) 300 MG 24 hr tablet Take 300 mg by mouth daily.     . cetirizine (ZYRTEC) 10 MG tablet Take 10 mg by mouth daily. [facility]    . Cholecalciferol (VITAMIN D-3) 125 MCG (5000 UT) TABS Take 5,000 Units by mouth daily. (Patient taking differently: Take 50 mg by mouth daily. ) 30 tablet   . esomeprazole (NEXIUM) 40 MG capsule Take  40 mg by mouth every 36 (thirty-six) hours.    [name] FLUoxetine (PROZAC) 10 MG capsule Take 10 mg by mouth daily with lunch.     . Insulin Pen Needle (BD PEN NEEDLE NANO 2ND GEN) 32G X 4 MM MISC 1 Package by Does not apply route daily. 100 each 0  . Liraglutide -Weight Management (SAXENDA) 18 MG/3ML SOPN Inject 0.6 mg into the skin daily. 15 mL 0  . metFORMIN (GLUCOPHAGE) 500 MG tablet Take 1 tablet (500 mg total) by mouth 2 (two) times daily with a meal. 60 tablet 0  . Multiple Vitamin (MULTIVITAMIN WITH MINERALS) TABS tablet Take 1 tablet by mouth daily.    [name] rOPINIRole (REQUIP) 0.25 MG tablet Take 0.5 mg by mouth daily at 8 pm.    . rosuvastatin (CRESTOR) 10 MG tablet Take 10 mg by mouth at bedtime.    . triamterene-hydrochlorothiazide (MAXZIDE-25) 37.5-25 MG tablet Take 1 tablet by mouth daily.     No current facility-administered medications for this visit.    OBJECTIVE: White woman in no acute distress  Vitals:   07/11/20 1530  BP: (!) 158/77  Pulse: 92  Resp: 15  Temp: 97.7 F (36.5 C)  SpO2: 98%   Wt Readings from Last 3 Encounters:  07/11/20 247 lb 9.6 oz (112.3 kg)  07/04/20 242 lb (109.8 kg)  05/18/20 241 lb (109.3 kg)   Body mass index is 38.78 kg/m.    Sclerae unicteric, EOMs intact Wearing a mask No cervical or supraclavicular adenopathy Lungs no rales or rhonchi Heart regular rate and rhythm Abd soft, nontender, positive bowel sounds MSK no focal spinal tenderness, no upper extremity lymphedema Neuro: nonfocal, well oriented, appropriate affect Breasts:    LAB RESULTS:  CMP     Component Value Date/Time   NA 140 04/04/2020 0810   NA 141 04/16/2017 1409   K 4.2 04/04/2020 0810   K 3.8 04/16/2017 1409   CL 100 04/04/2020 0810   CO2 27 04/04/2020 0810   CO2 25 04/16/2017 1409   GLUCOSE 113 (H) 04/04/2020 0810   GLUCOSE 120 (H) 04/15/2019 0941   GLUCOSE 134 04/16/2017 1409   BUN 16 04/04/2020 0810   BUN 12.4 04/16/2017 1409   CREATININE 0.75  04/04/2020 0810   CREATININE 0.87 04/15/2019 0941   CREATININE 0.9 04/16/2017 1409  CALCIUM 9.8 04/04/2020 0810   CALCIUM 9.6 04/16/2017 1409   PROT 6.7 04/04/2020 0810   PROT 7.0 04/16/2017 1409   ALBUMIN 4.5 04/04/2020 0810   ALBUMIN 4.0 04/16/2017 1409   AST 21 04/04/2020 0810   AST 17 04/15/2019 0941   AST 29 04/16/2017 1409   ALT 28 04/04/2020 0810   ALT 19 04/15/2019 0941   ALT 34 04/16/2017 1409   ALKPHOS 86 04/04/2020 0810   ALKPHOS 74 04/16/2017 1409   BILITOT 0.3 04/04/2020 0810   BILITOT 0.3 04/15/2019 0941   BILITOT 0.40 04/16/2017 1409   GFRNONAA 88 04/04/2020 0810   GFRNONAA >60 04/15/2019 0941   GFRAA 102 04/04/2020 0810   GFRAA >60 04/15/2019 0941    INo results found for: SPEP, UPEP  Lab Results  Component Value Date   WBC 7.1 04/15/2019   NEUTROABS 4.0 04/15/2019   HGB 13.4 04/15/2019   HCT 41.4 04/15/2019   MCV 83.8 04/15/2019   PLT 297 04/15/2019      Chemistry      Component Value Date/Time   NA 140 04/04/2020 0810   NA 141 04/16/2017 1409   K 4.2 04/04/2020 0810   K 3.8 04/16/2017 1409   CL 100 04/04/2020 0810   CO2 27 04/04/2020 0810   CO2 25 04/16/2017 1409   BUN 16 04/04/2020 0810   BUN 12.4 04/16/2017 1409   CREATININE 0.75 04/04/2020 0810   CREATININE 0.87 04/15/2019 0941   CREATININE 0.9 04/16/2017 1409      Component Value Date/Time   CALCIUM 9.8 04/04/2020 0810   CALCIUM 9.6 04/16/2017 1409   ALKPHOS 86 04/04/2020 0810   ALKPHOS 74 04/16/2017 1409   AST 21 04/04/2020 0810   AST 17 04/15/2019 0941   AST 29 04/16/2017 1409   ALT 28 04/04/2020 0810   ALT 19 04/15/2019 0941   ALT 34 04/16/2017 1409   BILITOT 0.3 04/04/2020 0810   BILITOT 0.3 04/15/2019 0941   BILITOT 0.40 04/16/2017 1409       No results found for: LABCA2  No components found for: LABCA125  No results for input(s): INR in the last 168 hours.  Urinalysis    Component Value Date/Time   COLORURINE YELLOW 01/24/2013 0327   APPEARANCEUR TURBID (A)  01/24/2013 0327   LABSPEC 1.021 01/24/2013 0327   PHURINE 7.5 01/24/2013 0327   GLUCOSEU NEGATIVE 01/24/2013 0327   HGBUR NEGATIVE 01/24/2013 0327   BILIRUBINUR NEGATIVE 01/24/2013 0327   KETONESUR 15 (A) 01/24/2013 0327   PROTEINUR NEGATIVE 01/24/2013 0327   UROBILINOGEN 0.2 01/24/2013 0327   NITRITE NEGATIVE 01/24/2013 0327   LEUKOCYTESUR NEGATIVE 01/24/2013 0327    STUDIES: No results found.   ELIGIBLE FOR AVAILABLE RESEARCH PROTOCOL: no  ASSESSMENT: 60 y.o. BRCA negative [place] woman status post right breast upper outer quadrant biopsy 04/05/2016 for a clinical T1b N0, stage IA invasive lobular breast cancer, grade 2 or 3, estrogen and progesterone receptor positive, HER-2 not amplified, with an MIB-1 of 20%  (1) genetics testing 04/23/2016 through the [facility] offered by [facility] Hope Pigeon, MD)  found no deleterious mutations in  ATM, BARD1, BRCA1, BRCA2, BRIP1, CDH1, CHEK2, FANCC, MLH1, MSH2, MSH6, NBN, PALB2, PMS2, PTEN, RAD51C, RAD51D, TP53, and XRCC2.  This panel also includes deletion/duplication analysis (without sequencing) for one gene, EPCAM  (2) status post right lumpectomy and sentinel lymph node sampling 05/28/2016 for a pT1b pN0, stage IA invasive lobular breast cancer, grade 2, with negative margins  (3) Oncotype  Dx score of 22 predicts a 10 year risk of recurrence outside the breast of 14% if the patient's only systemic therapy is tamoxifen for 5 years. It predicts a benefit from chemotherapy in the 4% range.   (a) Patient opted against adjuvant chemotherapy   (4) adjuvant radiation 07/22/16-09/04/16 Site/dose:   1) Right breast/ 50.4 Gy in 28 fractions                         2) Right breast boost/ 10 Gy in 5 fractions  (5) started anastrozole 09/29/2016   (a) bone density 03/28/2017 at the [facility] showed a T score of 0.8, normal   (b) repeat bone density 04/08/2019 showed a T score of 0.3  (normal).   PLAN: Santia is now a little over 4 years out from definitive surgery for her breast cancer with no evidence of disease recurrence.  That is very favorable.  She is tolerating anastrozole well and the plan is to continue that through March 2023.  Given her overall good prognosis I am comfortable stopping anastrozole at 5 years.  I commended her participation in the weight loss and exercise program and commended the excellent job she has done bringing up her daughters, who are real achiever's  She will see my partner Dr. Sonny Dandy March 2023 and likely that will be her "graduation visit".    Total encounter time 25 minutes.*   Kariann Wecker, Virgie Dad, MD  07/11/20 4:26 PM Medical Oncology and Hematology [facility] [place], [place] [address] Tel. [phone]    Fax. [phone]   I, Wilburn Mylar, am acting as scribe for Dr. Virgie Dad. Idris Edmundson.  I, Lurline Del MD, have reviewed the above documentation for accuracy and completeness, and I agree with the above.   *Total Encounter Time as defined by the Centers for Medicare and Medicaid Services includes, in addition to the face-to-face time of a patient visit (documented in the note above) non-face-to-face time: obtaining and reviewing outside history, ordering and reviewing medications, tests or procedures, care coordination (communications with other health care professionals or caregivers) and documentation in the medical record.

## 2020-07-11 ENCOUNTER — Other Ambulatory Visit: Payer: Self-pay

## 2020-07-11 ENCOUNTER — Inpatient Hospital Stay: Payer: 59 | Attending: Oncology | Admitting: Oncology

## 2020-07-11 ENCOUNTER — Other Ambulatory Visit: Payer: Self-pay | Admitting: Oncology

## 2020-07-11 VITALS — BP 158/77 | HR 92 | Temp 97.7°F | Resp 15 | Ht 67.0 in | Wt 247.6 lb

## 2020-07-11 DIAGNOSIS — F32A Depression, unspecified: Secondary | ICD-10-CM | POA: Diagnosis not present

## 2020-07-11 DIAGNOSIS — Z823 Family history of stroke: Secondary | ICD-10-CM | POA: Insufficient documentation

## 2020-07-11 DIAGNOSIS — Z811 Family history of alcohol abuse and dependence: Secondary | ICD-10-CM | POA: Insufficient documentation

## 2020-07-11 DIAGNOSIS — C50411 Malignant neoplasm of upper-outer quadrant of right female breast: Secondary | ICD-10-CM | POA: Diagnosis not present

## 2020-07-11 DIAGNOSIS — Z8249 Family history of ischemic heart disease and other diseases of the circulatory system: Secondary | ICD-10-CM | POA: Insufficient documentation

## 2020-07-11 DIAGNOSIS — Z6838 Body mass index (BMI) 38.0-38.9, adult: Secondary | ICD-10-CM | POA: Diagnosis not present

## 2020-07-11 DIAGNOSIS — Z79899 Other long term (current) drug therapy: Secondary | ICD-10-CM | POA: Diagnosis not present

## 2020-07-11 DIAGNOSIS — Z806 Family history of leukemia: Secondary | ICD-10-CM | POA: Diagnosis not present

## 2020-07-11 DIAGNOSIS — E669 Obesity, unspecified: Secondary | ICD-10-CM | POA: Diagnosis not present

## 2020-07-11 DIAGNOSIS — Z170 Estrogen receptor positive status [ER+]: Secondary | ICD-10-CM | POA: Diagnosis not present

## 2020-07-11 DIAGNOSIS — Z923 Personal history of irradiation: Secondary | ICD-10-CM | POA: Insufficient documentation

## 2020-07-11 DIAGNOSIS — Z818 Family history of other mental and behavioral disorders: Secondary | ICD-10-CM | POA: Insufficient documentation

## 2020-07-11 DIAGNOSIS — Z8719 Personal history of other diseases of the digestive system: Secondary | ICD-10-CM | POA: Insufficient documentation

## 2020-07-11 DIAGNOSIS — Z9049 Acquired absence of other specified parts of digestive tract: Secondary | ICD-10-CM | POA: Insufficient documentation

## 2020-07-11 DIAGNOSIS — Z836 Family history of other diseases of the respiratory system: Secondary | ICD-10-CM | POA: Insufficient documentation

## 2020-07-11 DIAGNOSIS — Z8349 Family history of other endocrine, nutritional and metabolic diseases: Secondary | ICD-10-CM | POA: Insufficient documentation

## 2020-07-11 DIAGNOSIS — Z79811 Long term (current) use of aromatase inhibitors: Secondary | ICD-10-CM | POA: Insufficient documentation

## 2020-07-11 DIAGNOSIS — Z803 Family history of malignant neoplasm of breast: Secondary | ICD-10-CM | POA: Insufficient documentation

## 2020-07-11 MED ORDER — ANASTROZOLE 1 MG PO TABS
1.0000 mg | ORAL_TABLET | Freq: Every day | ORAL | 6 refills | Status: DC
Start: 2020-07-11 — End: 2020-07-11

## 2020-07-11 MED FILL — ANASTROZOLE 1 MG TABLET: 1 | 90 days supply | Qty: 90 | Fill #0

## 2020-07-12 ENCOUNTER — Encounter (INDEPENDENT_AMBULATORY_CARE_PROVIDER_SITE_OTHER): Payer: Self-pay

## 2020-07-13 ENCOUNTER — Other Ambulatory Visit (HOSPITAL_COMMUNITY): Payer: Self-pay | Admitting: Family Medicine

## 2020-07-13 MED FILL — buPROPion HCL ER (XL) 300 M: 300 | 90 days supply | Qty: 90 | Fill #0

## 2020-07-13 MED FILL — FLUoxetine HCL 10 MG CAPS: 10 | 90 days supply | Qty: 90 | Fill #0

## 2020-07-15 ENCOUNTER — Encounter (INDEPENDENT_AMBULATORY_CARE_PROVIDER_SITE_OTHER): Payer: Self-pay

## 2020-07-18 ENCOUNTER — Other Ambulatory Visit (INDEPENDENT_AMBULATORY_CARE_PROVIDER_SITE_OTHER): Payer: Self-pay | Admitting: Physician Assistant

## 2020-07-18 ENCOUNTER — Encounter (INDEPENDENT_AMBULATORY_CARE_PROVIDER_SITE_OTHER): Payer: Self-pay | Admitting: Physician Assistant

## 2020-07-18 ENCOUNTER — Telehealth (INDEPENDENT_AMBULATORY_CARE_PROVIDER_SITE_OTHER): Payer: 59 | Admitting: Physician Assistant

## 2020-07-18 DIAGNOSIS — R7303 Prediabetes: Secondary | ICD-10-CM | POA: Diagnosis not present

## 2020-07-18 DIAGNOSIS — Z6837 Body mass index (BMI) 37.0-37.9, adult: Secondary | ICD-10-CM | POA: Diagnosis not present

## 2020-07-18 DIAGNOSIS — G4733 Obstructive sleep apnea (adult) (pediatric): Secondary | ICD-10-CM

## 2020-07-18 MED ORDER — METFORMIN HCL 500 MG PO TABS: 500.0000 mg | ORAL_TABLET | Freq: Two times a day (BID) | ORAL | 0 refills | Status: DC

## 2020-07-19 ENCOUNTER — Telehealth (INDEPENDENT_AMBULATORY_CARE_PROVIDER_SITE_OTHER): Payer: 59 | Admitting: Physician Assistant

## 2020-07-19 ENCOUNTER — Encounter (INDEPENDENT_AMBULATORY_CARE_PROVIDER_SITE_OTHER): Payer: Self-pay | Admitting: Physician Assistant

## 2020-07-19 NOTE — Progress Notes (Signed)
TeleHealth Visit:  Due to the COVID-19 pandemic, this visit was completed with telemedicine (audio/video) technology to reduce patient and provider exposure as well as to preserve personal protective equipment.   Candace [name] has verbally consented to this TeleHealth visit. The patient is located at home, the provider is located at the [facility] office. The participants in this visit include the listed provider and patient. The visit was conducted today via [place].   Chief Complaint: OBESITY Candace [name] is here to discuss her progress with her obesity treatment plan along with follow-up of her obesity related diagnoses. [name] is on the Category 4 Plan and states she is following her eating plan approximately 70% of the time. [name] states she is doing 0 minutes 0 times per week.  Today's visit was #: 87 Starting weight: 249 lbs Starting date: 09/17/2017  Interim History: Candace [name] is on 1.8 mg of Saxenda. She feels good on that dose and she is snaking less. Her hunger is controlled. She has been crock potting some meals. She is willing to start walking more often.  Subjective:   1. Pre-diabetes Candace [name] is on metformin, and she denies nausea, vomiting, or muscle weakness. She denies polyphagia.  2. OSA (obstructive sleep apnea) Candace [name] is not using CPAP consistently, as she does not like using the mask/nasal canula options.  Assessment/Plan:   1. Pre-diabetes Candace [name] will continue to work on weight loss, exercise, and decreasing simple carbohydrates to help decrease the risk of diabetes. We will refill metformin for 1 month.  - metFORMIN (GLUCOPHAGE) 500 MG tablet; Take 1 tablet (500 mg total) by mouth 2 (two) times daily with a meal.  Dispense: 60 tablet; Refill: 0  2. OSA (obstructive sleep apnea) Intensive lifestyle modifications are the first line treatment for this issue. We discussed several lifestyle modifications today. Candace [name] is to use CPAP nightly as prescribed. She  will follow up with Neurology in 2 weeks (has an appointment). She will continue to work on diet, exercise and weight loss efforts. We will continue to monitor. Orders and follow up as documented in patient record.   3. Class 2 severe obesity with serious comorbidity and body mass index (BMI) of 37.0 to 37.9 in adult, unspecified obesity type (HCC) Candace [name] is currently in the action stage of change. As such, her goal is to continue with weight loss efforts. She has agreed to the Category 4 Plan.   Exercise goals: No exercise has been prescribed at this time.  Behavioral modification strategies: meal planning and cooking strategies and keeping healthy foods in the home.  Candace [name] has agreed to follow-up with our clinic in 2 weeks. She was informed of the importance of frequent follow-up visits to maximize her success with intensive lifestyle modifications for her multiple health conditions.  Objective:   VITALS: Per patient if applicable, see vitals. GENERAL: Alert and in no acute distress. CARDIOPULMONARY: No increased WOB. Speaking in clear sentences.  PSYCH: Pleasant and cooperative. Speech normal rate and rhythm. Affect is appropriate. Insight and judgement are appropriate. Attention is focused, linear, and appropriate.  NEURO: Oriented as arrived to appointment on time with no prompting.   Lab Results  Component Value Date   CREATININE 0.75 04/04/2020   BUN 16 04/04/2020   NA 140 04/04/2020   K 4.2 04/04/2020   CL 100 04/04/2020   CO2 27 04/04/2020   Lab Results  Component Value Date   ALT 28 04/04/2020   AST 21 04/04/2020   ALKPHOS 86 04/04/2020  BILITOT 0.3 04/04/2020   Lab Results  Component Value Date   HGBA1C 6.2 (H) 04/04/2020   HGBA1C 6.0 (H) 09/08/2019   HGBA1C 6.2 (H) 02/04/2019   HGBA1C 5.9 (H) 08/06/2018   HGBA1C 5.9 (H) 02/18/2018   Lab Results  Component Value Date   INSULIN 22.9 04/04/2020   INSULIN 12.6 09/08/2019   INSULIN 23.8 02/04/2019   INSULIN  20.6 08/06/2018   INSULIN 15.2 02/18/2018   Lab Results  Component Value Date   TSH 2.390 09/17/2017   Lab Results  Component Value Date   CHOL 187 04/04/2020   HDL 48 04/04/2020   LDLCALC 95 04/04/2020   TRIG 262 (H) 04/04/2020   CHOLHDL 3.9 04/04/2020   Lab Results  Component Value Date   WBC 7.1 04/15/2019   HGB 13.4 04/15/2019   HCT 41.4 04/15/2019   MCV 83.8 04/15/2019   PLT 297 04/15/2019   No results found for: IRON, TIBC, FERRITIN  Attestation Statements:   Reviewed by clinician on day of visit: allergies, medications, problem list, medical history, surgical history, family history, social history, and previous encounter notes.   Wilhemena Durie, am acting as transcriptionist for [employer], PA-C.  I have reviewed the above documentation for accuracy and completeness, and I agree with the above. [name], PA-C

## 2020-07-20 ENCOUNTER — Other Ambulatory Visit (INDEPENDENT_AMBULATORY_CARE_PROVIDER_SITE_OTHER): Payer: Self-pay | Admitting: Physician Assistant

## 2020-07-20 MED FILL — UNIFINE PENTIPS 32GX5/32: 32G X 4 MM | 90 days supply | Qty: 100 | Fill #0

## 2020-07-21 MED FILL — METFORMIN HCL 500 MG TABS: 500 | 30 days supply | Qty: 60 | Fill #0

## 2020-07-24 ENCOUNTER — Other Ambulatory Visit (INDEPENDENT_AMBULATORY_CARE_PROVIDER_SITE_OTHER): Payer: Self-pay | Admitting: Physician Assistant

## 2020-07-24 DIAGNOSIS — Z6838 Body mass index (BMI) 38.0-38.9, adult: Secondary | ICD-10-CM

## 2020-07-24 NOTE — Telephone Encounter (Signed)
This patient was last seen by [name], PA-C and currently has an upcoming appt scheduled on 08/01/20 with her.

## 2020-07-24 NOTE — Telephone Encounter (Signed)
Rx Request 

## 2020-07-26 ENCOUNTER — Ambulatory Visit (INDEPENDENT_AMBULATORY_CARE_PROVIDER_SITE_OTHER): Payer: 59 | Admitting: Family Medicine

## 2020-07-26 ENCOUNTER — Encounter: Payer: Self-pay | Admitting: Family Medicine

## 2020-07-26 VITALS — BP 140/72 | HR 90 | Ht 67.0 in | Wt 245.0 lb

## 2020-07-26 DIAGNOSIS — Z9989 Dependence on other enabling machines and devices: Secondary | ICD-10-CM | POA: Diagnosis not present

## 2020-07-26 DIAGNOSIS — G4733 Obstructive sleep apnea (adult) (pediatric): Secondary | ICD-10-CM

## 2020-07-26 NOTE — Patient Instructions (Signed)
Please continue using your CPAP regularly. While your insurance requires that you use CPAP at least 4 hours each night on 70% of the nights, I recommend, that you not skip any nights and use it throughout the night if you can. Getting used to CPAP and staying with the treatment long term does take time and patience and discipline. Untreated obstructive sleep apnea when it is moderate to severe can have an adverse impact on cardiovascular health and raise her risk for heart disease, arrhythmias, hypertension, congestive heart failure, stroke and diabetes. Untreated obstructive sleep apnea causes sleep disruption, nonrestorative sleep, and sleep deprivation. This can have an impact on your day to day functioning and cause daytime sleepiness and impairment of cognitive function, memory loss, mood disturbance, and problems focussing. Using CPAP regularly can improve these symptoms.   Follow up in 3-4 months via Mychart   Sleep Apnea Sleep apnea affects breathing during sleep. It causes breathing to stop for a short time or to become shallow. It can also increase the risk of:  Heart attack.  Stroke.  Being very overweight (obese).  Diabetes.  Heart failure.  Irregular heartbeat. The goal of treatment is to help you breathe normally again. What are the causes? There are three kinds of sleep apnea:  Obstructive sleep apnea. This is caused by a blocked or collapsed airway.  Central sleep apnea. This happens when the brain does not send the right signals to the muscles that control breathing.  Mixed sleep apnea. This is a combination of obstructive and central sleep apnea. The most common cause of this condition is a collapsed or blocked airway. This can happen if:  Your throat muscles are too relaxed.  Your tongue and tonsils are too large.  You are overweight.  Your airway is too small.   What increases the risk?  Being overweight.  Smoking.  Having a small airway.  Being  older.  Being female.  Drinking alcohol.  Taking medicines to calm yourself (sedatives or tranquilizers).  Having family members with the condition. What are the signs or symptoms?  Trouble staying asleep.  Being sleepy or tired during the day.  Getting angry a lot.  Loud snoring.  Headaches in the morning.  Not being able to focus your mind (concentrate).  Forgetting things.  Less interest in sex.  Mood swings.  Personality changes.  Feelings of sadness (depression).  Waking up a lot during the night to pee (urinate).  Dry mouth.  Sore throat. How is this diagnosed?  Your medical history.  A physical exam.  A test that is done when you are sleeping (sleep study). The test is most often done in a sleep lab but may also be done at home. How is this treated?  Sleeping on your side.  Using a medicine to get rid of mucus in your nose (decongestant).  Avoiding the use of alcohol, medicines to help you relax, or certain pain medicines (narcotics).  Losing weight, if needed.  Changing your diet.  Not smoking.  Using a machine to open your airway while you sleep, such as: ? An oral appliance. This is a mouthpiece that shifts your lower jaw forward. ? A CPAP device. This device blows air through a mask when you breathe out (exhale). ? An EPAP device. This has valves that you put in each nostril. ? A BPAP device. This device blows air through a mask when you breathe in (inhale) and breathe out.  Having surgery if other treatments do not  work. It is important to get treatment for sleep apnea. Without treatment, it can lead to:  High blood pressure.  Coronary artery disease.  In men, not being able to have an erection (impotence).  Reduced thinking ability.   Follow these instructions at home: Lifestyle  Make changes that your doctor recommends.  Eat a healthy diet.  Lose weight if needed.  Avoid alcohol, medicines to help you relax, and some pain  medicines.  Do not use any products that contain nicotine or tobacco, such as cigarettes, e-cigarettes, and chewing tobacco. If you need help quitting, ask your doctor. General instructions  Take over-the-counter and prescription medicines only as told by your doctor.  If you were given a machine to use while you sleep, use it only as told by your doctor.  If you are having surgery, make sure to tell your doctor you have sleep apnea. You may need to bring your device with you.  Keep all follow-up visits as told by your doctor. This is important. Contact a doctor if:  The machine that you were given to use during sleep bothers you or does not seem to be working.  You do not get better.  You get worse. Get help right away if:  Your chest hurts.  You have trouble breathing in enough air.  You have an uncomfortable feeling in your back, arms, or stomach.  You have trouble talking.  One side of your body feels weak.  A part of your face is hanging down. These symptoms may be an emergency. Do not wait to see if the symptoms will go away. Get medical help right away. Call your local emergency services (911 in the [place] Do not drive yourself to the hospital. Summary  This condition affects breathing during sleep.  The most common cause is a collapsed or blocked airway.  The goal of treatment is to help you breathe normally while you sleep. This information is not intended to replace advice given to you by your health care provider. Make sure you discuss any questions you have with your health care provider. Document Revised: 04/03/2018 Document Reviewed: 02/10/2018 Elsevier Patient Education  [place].

## 2020-07-26 NOTE — Progress Notes (Addendum)
PATIENT: Candace [name] DOB: Oct 25, 1960  REASON FOR VISIT: follow up HISTORY FROM: patient  Chief Complaint  Patient presents with  . Follow-up    Rm 2 alone here for f/u on CPAP. Pt reports she is using the nasal cannula and tolerates well when she wears the cpap. She stopped using her machine a few weeks ago due to a cold. She sts she is back on the cpap and is trying to be more consistent.       HISTORY OF PRESENT ILLNESS: 07/26/20 ALL Candace [name] is a 60 y.o. female here today for follow up for OSA on CPAP.  At our last visit in 03/2020, she was having a difficult time tolerating a full facemask.  Orders were sent for a mask refit. She is now using nasal pillow and feels it is much better tolerated.  She admits that she has not been consistent with CPAP usage.  She suffered a prolonged respiratory illness in December that lasted for over 3 weeks.  She states that she is not been able to get back into the habit of using CPAP.  Over the past few days she has resumed use and is doing very well.  She does note benefit of using CPAP.  Weight management has encouraged her to follow-up with [name] and continue CPAP usage.  Compliance report dated 04/26/2020 through 07/24/2020 shows that she used CPAP 20 of the past 90 days for compliance of 22%.  She used CPAP greater than 4 hours 4 of the past 90 days for compliance of 4%.  Residual AHI was 1.4 on a set pressure of 12 cm of water and EPR of 3.  There was no significant leak noted.  History (copied from previous note)  04/20/20 ALL: Candace [name] is a 60 y.o. female here today for follow up for OSA on CPAP.  She reports that she is struggled with compliance over the past 3 months.  After last being seen in July she was very motivated to continue CPAP.  She feels that she is doing really well for a few months but has not tolerated using a full facemask.  She feels that she gets frustrated with the discomfort of the mask and takes it off in the middle  the night.  She continues to try to use CPAP every night.  She does recognize improvement in sleep quality and improve daytime energy levels when using CPAP consistently.  She is motivated to continue working on compliance.  Compliance report dated 03/20/2020 through 04/18/2020 reveals that she used CPAP 26 of the past 30 days for compliance of 87%.  She used CPAP greater than 4 hours 4 of the last 30 days for compliance of 13%.  Average usage on days used was 2 hours and 13 minutes.  Residual AHI was 1.9 on a set pressure of 12 cm of water and EPR 3.  There was a leak noted in the 90 to percentile of 24.9 L/min.  HISTORY: (copied from Dr Guadelupe Sabin note on 01/26/2020)  Candace [name] is a 60 year old right-handed woman with an underlying medical history of prediabetes, seasonal allergies, restless leg syndrome, reflux disease, hyperlipidemia, depression, anxiety, breast cancer, with status post radiation treatment after lumpectomy,vitamin D deficiency and obesity, who Presents for follow-up consultation of her obstructive sleep apnea, after interim sleep study testing and starting CPAP therapy. The patient is unaccompanied today. I first met her on 09/15/2019 as a referral from weight management for concern for sleep  apnea. She reported snoring and excessive daytime somnolence at the time as well as difficulty initiating and maintaining sleep and a history of restless leg syndrome. She was advised to proceed with sleep study testing. He had a baseline sleep study, followed by a CPAP titration study. Her baseline sleep study from 10/05/2019 indicated severe obstructive sleep apnea with a total AHI of 53.7/h, supine AHI of 110.5/h, O2 nadir of 78%. She had absence of REM sleep during that test. She was advised to return for a full night titration study, which she had on 11/04/2019. She was fitted with a small full facemask and titrated from 5 cm to 12 cm. On the final pressure of 12 cm, her AHI was 0/h with supine  non-REM sleep achieved an O2 nadir of 89%. She achieved 18.6% of sleep during the test. She was advised to proceed with CPAP therapy at home, set up date was 12/09/2019.  Today, 01/26/2020: I reviewed her CPAP Compliance data from 12/09/2019 through 01/07/2020, which is a total of 30 days, during which time she used her machine every night with percent use days greater than 4 hours at 67%, indicating slightly suboptimal compliance. Average usage for days on treatment of 4 hours and 10 minutes, residual AHI 3.8/h, leak on the higher end with a 95th percentile at 18 L/min on a pressure of 12 cm with EPR of 3. In the past couple of weeks her compliance has declined a little bit, average usage for days on treatment is about 3 hours. She reports that she is still adjusting to the machine. Sometimes she admits that she is falling asleep on the couch and slept for a couple of hours before she woke up and realized that she had not put the mask on. She uses a full facemask, she has noticed some benefit her daytime energy and improvement in her restless leg symptoms although she is also taking a slightly higher dose of her ropinirole.She is currently on 0.5 mg at bedtime. She reports that she had flareup of her restless leg symptoms after the baseline sleep study. She is motivated to continue with her CPAP and has no major complaints about it. She has taken it to a trip to [place] recently. She also took a trip to [place] with her family but was not able to stay.Hence the lapses intreatment even and around January 09, 2020.    REVIEW OF SYSTEMS: Out of a complete 14 system review of symptoms, the patient complains only of the following symptoms, none and all other reviewed systems are negative.   ALLERGIES: No Known Allergies  HOME MEDICATIONS: Outpatient Medications Prior to Visit  Medication Sig Dispense Refill  . anastrozole (ARIMIDEX) 1 MG tablet Take 1 tablet (1 mg total) by mouth daily. 90 tablet  6  . buPROPion (WELLBUTRIN XL) 300 MG 24 hr tablet Take 300 mg by mouth daily.     . cetirizine (ZYRTEC) 10 MG tablet Take 10 mg by mouth daily. [facility]    . Cholecalciferol (VITAMIN D-3) 125 MCG (5000 UT) TABS Take 5,000 Units by mouth daily. (Patient taking differently: Take 50 mg by mouth daily.) 30 tablet   . esomeprazole (NEXIUM) 40 MG capsule Take 40 mg by mouth every 36 (thirty-six) hours.    [name] FLUoxetine (PROZAC) 10 MG capsule Take 10 mg by mouth daily with lunch.     . Insulin Pen Needle (BD PEN NEEDLE NANO 2ND GEN) 32G X 4 MM MISC 1 Package by Does not  apply route daily. 100 each 0  . metFORMIN (GLUCOPHAGE) 500 MG tablet Take 1 tablet (500 mg total) by mouth 2 (two) times daily with a meal. 60 tablet 0  . Multiple Vitamin (MULTIVITAMIN WITH MINERALS) TABS tablet Take 1 tablet by mouth daily.    [name] rOPINIRole (REQUIP) 0.25 MG tablet Take 0.5 mg by mouth daily at 8 pm.    . rosuvastatin (CRESTOR) 10 MG tablet Take 10 mg by mouth at bedtime.    [name] SAXENDA 18 MG/3ML SOPN INJECT 0.6 MG INTO THE SKIN DAILY. 15 mL 0  . triamterene-hydrochlorothiazide (MAXZIDE-25) 37.5-25 MG tablet Take 1 tablet by mouth daily.     No facility-administered medications prior to visit.    PAST MEDICAL HISTORY: Past Medical History:  Diagnosis Date  . Abnormal Pap smear   . Alcohol abuse   . Anovulation   . Anxiety   . Breast cancer ([facility]) 04/05/2016   right breast  . Cancer ([facility]) 2017   right breast cancer  . Complication of anesthesia    slow to wake up  . Depression   . Endometrial hyperplasia   . Endometrial polyp    h/o  . Epidermal cyst    h/o  . Gallbladder problem   . GERD (gastroesophageal reflux disease)   . H/O hemorrhoids   . H/O menorrhagia   . High risk HPV infection   . Hyperlipidemia   . Insomnia   . Obesity   . Oligomenorrhea   . Prediabetes   . Restless legs syndrome   . Seasonal allergies   . Vitamin D deficiency     PAST SURGICAL  HISTORY: Past Surgical History:  Procedure Laterality Date  . BREAST LUMPECTOMY Right 05/2016   radiation  . BREAST LUMPECTOMY WITH RADIOACTIVE SEED AND SENTINEL LYMPH NODE BIOPSY Right 05/28/2016   Procedure: RADIOACTIVE SEED GUIDED RIGHT BREAST LUMPECTOMY WITH  RIGHT AXILLARY SENTINEL LYMPH NODE BIOPSY;  Surgeon: [name], MD;  Location: [facility];  Service: General;  Laterality: Right;  . CARPAL TUNNEL RELEASE  03/26/2011   right hand  . CARPAL TUNNEL RELEASE  05/28/2011   Procedure: CARPAL TUNNEL RELEASE;  Surgeon: [name];  Location: [facility];  Service: Orthopedics;  Laterality: Left;  . CHOLECYSTECTOMY N/A 01/24/2013   Procedure: LAPAROSCOPIC CHOLECYSTECTOMY WITH INTRAOPERATIVE CHOLANGIOGRAM;  Surgeon: [name], MD;  Location: [facility];  Service: General;  Laterality: N/A;  . COLONOSCOPY    . HYSTEROSCOPY    . POLYPECTOMY    . TONSILLECTOMY  1967   as child  . trigger fingers  8 yrs. ago   both done months apart  . uterine ablation  06/04/2010  . uterine ablation      FAMILY HISTORY: Family History  Problem Relation Age of Onset  . Heart disease Mother   . Hypertension Mother   . Stroke Mother   . Dementia Mother        vascular dementia  . Hyperlipidemia Mother   . Depression Mother   . Anxiety disorder Mother   . Alcohol abuse Mother   . Obesity Mother   . Pulmonary fibrosis Father        d. 58  . Depression Father   . Anxiety disorder Father   . Alcohol abuse Father   . Heart attack Maternal Grandmother        d. [phone]  . Heart disease Maternal Grandmother   . Heart disease Maternal Grandfather        d. 60-63  .  Leukemia Paternal Grandmother        d. late 57s  . Breast cancer Sister 7       IDC s/p mastectomy and chemo    SOCIAL HISTORY: Social History   Socioeconomic History  . Marital status: Divorced    Spouse name: Not on file  . Number of children: 2  . Years of education: Not on file  . Highest education level: Not  on file  Occupational History  . Not on file  Tobacco Use  . Smoking status: Never Smoker  . Smokeless tobacco: Never Used  Substance and Sexual Activity  . Alcohol use: No    Comment: none since 1996  . Drug use: No  . Sexual activity: Not Currently    Birth control/protection: None  Other Topics Concern  . Not on file  Social History Narrative  . Not on file   Social Determinants of Health   Financial Resource Strain: Not on file  Food Insecurity: Not on file  Transportation Needs: Not on file  Physical Activity: Not on file  Stress: Not on file  Social Connections: Not on file  Intimate Partner Violence: Not on file     PHYSICAL EXAM  Vitals:   07/26/20 1048  BP: 140/72  Pulse: 90  SpO2: 96%  Weight: 245 lb (111.1 kg)  Height: 5' 7" (1.702 m)   Body mass index is 38.37 kg/m.  Generalized: Well developed, in no acute distress  Cardiology: normal rate and rhythm, no murmur noted Respiratory: clear to auscultation bilaterally  Neurological examination  Mentation: Alert oriented to time, place, history taking. Follows all commands speech and language fluent Cranial nerve II-XII: Pupils were equal round reactive to light. Extraocular movements were full, visual field were full  Motor: The motor testing reveals 5 over 5 strength of all 4 extremities. Good symmetric motor tone is noted throughout.  Gait and station: Gait is normal.    DIAGNOSTIC DATA (LABS, IMAGING, TESTING) - I reviewed patient records, labs, notes, testing and imaging myself where available.  No flowsheet data found.   Lab Results  Component Value Date   WBC 7.1 04/15/2019   HGB 13.4 04/15/2019   HCT 41.4 04/15/2019   MCV 83.8 04/15/2019   PLT 297 04/15/2019      Component Value Date/Time   NA 140 04/04/2020 0810   NA 141 04/16/2017 1409   K 4.2 04/04/2020 0810   K 3.8 04/16/2017 1409   CL 100 04/04/2020 0810   CO2 27 04/04/2020 0810   CO2 25 04/16/2017 1409   GLUCOSE 113 (H)  04/04/2020 0810   GLUCOSE 120 (H) 04/15/2019 0941   GLUCOSE 134 04/16/2017 1409   BUN 16 04/04/2020 0810   BUN 12.4 04/16/2017 1409   CREATININE 0.75 04/04/2020 0810   CREATININE 0.87 04/15/2019 0941   CREATININE 0.9 04/16/2017 1409   CALCIUM 9.8 04/04/2020 0810   CALCIUM 9.6 04/16/2017 1409   PROT 6.7 04/04/2020 0810   PROT 7.0 04/16/2017 1409   ALBUMIN 4.5 04/04/2020 0810   ALBUMIN 4.0 04/16/2017 1409   AST 21 04/04/2020 0810   AST 17 04/15/2019 0941   AST 29 04/16/2017 1409   ALT 28 04/04/2020 0810   ALT 19 04/15/2019 0941   ALT 34 04/16/2017 1409   ALKPHOS 86 04/04/2020 0810   ALKPHOS 74 04/16/2017 1409   BILITOT 0.3 04/04/2020 0810   BILITOT 0.3 04/15/2019 0941   BILITOT 0.40 04/16/2017 1409   GFRNONAA 88 04/04/2020 0810  GFRNONAA >60 04/15/2019 0941   GFRAA 102 04/04/2020 0810   GFRAA >60 04/15/2019 0941   Lab Results  Component Value Date   CHOL 187 04/04/2020   HDL 48 04/04/2020   LDLCALC 95 04/04/2020   TRIG 262 (H) 04/04/2020   CHOLHDL 3.9 04/04/2020   Lab Results  Component Value Date   HGBA1C 6.2 (H) 04/04/2020   Lab Results  Component Value Date   VITAMINB12 826 09/17/2017   Lab Results  Component Value Date   TSH 2.390 09/17/2017     ASSESSMENT AND PLAN 60 y.o. year old female  has a past medical history of Abnormal Pap smear, Alcohol abuse, Anovulation, Anxiety, Breast cancer ([facility]) (04/05/2016), Cancer ([facility]) (0037), Complication of anesthesia, Depression, Endometrial hyperplasia, Endometrial polyp, Epidermal cyst, Gallbladder problem, GERD (gastroesophageal reflux disease), H/O hemorrhoids, H/O menorrhagia, High risk HPV infection, Hyperlipidemia, Insomnia, Obesity, Oligomenorrhea, Prediabetes, Restless legs syndrome, Seasonal allergies, and Vitamin D deficiency. here with     ICD-10-CM   1. OSA on CPAP  G47.33    Z99.89      Candace [name] is doing well on CPAP therapy but admits to inconsistent use.  Nasal pillow has worked much better for  her. Compliance report reveals suboptimal use.  She remains motivated to resume regular use of CPAP. She was encouraged to continue using CPAP nightly and for greater than 4 hours each night. We will update supply orders as indicated. Risks of untreated sleep apnea review and education materials provided. Healthy lifestyle habits encouraged. She will follow up in 3-4 months, sooner if needed. She verbalizes understanding and agreement with this plan.   No orders of the defined types were placed in this encounter.    No orders of the defined types were placed in this encounter.     I spent 15 minutes with the patient. 50% of this time was spent counseling and educating patient on plan of care and medications.    Debbora Presto, FNP-C 07/26/2020, 10:50 AM [facility] [address] [place], [place] [address] [phone]  I reviewed the above note and documentation by the Nurse Practitioner and agree with the history, exam, assessment and plan as outlined above. I was available for consultation. Star Age, MD, PhD [facility])

## 2020-07-27 MED FILL — ESOMEPRAZOLE MAG DR 40 MG C: 40 | 90 days supply | Qty: 90 | Fill #2

## 2020-07-28 ENCOUNTER — Other Ambulatory Visit (INDEPENDENT_AMBULATORY_CARE_PROVIDER_SITE_OTHER): Payer: Self-pay | Admitting: Physician Assistant

## 2020-07-31 ENCOUNTER — Other Ambulatory Visit (INDEPENDENT_AMBULATORY_CARE_PROVIDER_SITE_OTHER): Payer: Self-pay | Admitting: Physician Assistant

## 2020-07-31 DIAGNOSIS — Z6838 Body mass index (BMI) 38.0-38.9, adult: Secondary | ICD-10-CM

## 2020-07-31 NOTE — Telephone Encounter (Signed)
Dawn just refilled this med a few days ago so don't fill it please.

## 2020-08-01 ENCOUNTER — Ambulatory Visit (INDEPENDENT_AMBULATORY_CARE_PROVIDER_SITE_OTHER): Payer: 59 | Admitting: Physician Assistant

## 2020-08-01 ENCOUNTER — Other Ambulatory Visit: Payer: Self-pay

## 2020-08-01 ENCOUNTER — Encounter (INDEPENDENT_AMBULATORY_CARE_PROVIDER_SITE_OTHER): Payer: Self-pay | Admitting: Physician Assistant

## 2020-08-01 VITALS — BP 134/79 | HR 84 | Temp 98.2°F | Ht 67.0 in | Wt 242.0 lb

## 2020-08-01 DIAGNOSIS — E559 Vitamin D deficiency, unspecified: Secondary | ICD-10-CM | POA: Diagnosis not present

## 2020-08-01 DIAGNOSIS — Z6838 Body mass index (BMI) 38.0-38.9, adult: Secondary | ICD-10-CM

## 2020-08-01 DIAGNOSIS — E7849 Other hyperlipidemia: Secondary | ICD-10-CM

## 2020-08-02 NOTE — Progress Notes (Signed)
Chief Complaint:   OBESITY Candace [name] is here to discuss her progress with her obesity treatment plan along with follow-up of her obesity related diagnoses. Candace [name] is on the Category 4 Plan and states she is following her eating plan approximately 75-80% of the time. Candace [name] states she is walking for 20 minutes 7 times per week.  Today's visit was #: 39 Starting weight: 249 lbs Starting date: 09/17/2017 Today's weight: 242 lbs Today's date: 08/01/2020 Total lbs lost to date: 7 Total lbs lost since last in-office visit: 0  Interim History: [name] reports that the last 2 weeks have been pretty "routine". She has been eating a lot of deli meat. She states that she eats all of her snacks, but not all of her dinner. She is not hungry on Saxenda 1.8 mg.  Subjective:   1. Vitamin D deficiency Candace [name] is on Vit D 5,000 units daily, and she denies nausea, vomiting, or muscle weakness. She is due for labs soon.  2. Other hyperlipidemia Candace [name] is on Crestor, and she denies chest pain or myalgias.  Assessment/Plan:   1. Vitamin D deficiency Low Vitamin D level contributes to fatigue and are associated with obesity, breast, and colon cancer. Candace [name] agreed to continue taking OTC Vitamin D 5,000 IU daily and will follow-up for routine testing of Vitamin D, at least 2-3 times per year to avoid over-replacement.  2. Other hyperlipidemia Cardiovascular risk and specific lipid/LDL goals reviewed. We discussed several lifestyle modifications today. Candace [name] will continue her medications, and will continue to work on diet, exercise and weight loss efforts. Orders and follow up as documented in patient record.   Counseling Intensive lifestyle modifications are the first line treatment for this issue. . Dietary changes: Increase soluble fiber. Decrease simple carbohydrates. . Exercise changes: Moderate to vigorous-intensity aerobic activity 150 minutes per week if tolerated. . Lipid-lowering medications:  see documented in medical record.  3. Class 2 severe obesity with serious comorbidity and body mass index (BMI) of 38.0 to 38.9 in adult, unspecified obesity type (HCC) Candace [name] is currently in the action stage of change. As such, her goal is to continue with weight loss efforts. She has agreed to change to the Category 3 Plan + 100 protein calories.   We will recheck labs at her next visit.  Exercise goals: As is.  Behavioral modification strategies: decreasing simple carbohydrates and better snacking choices.  Candace [name] has agreed to follow-up with our clinic in 2 weeks. She was informed of the importance of frequent follow-up visits to maximize her success with intensive lifestyle modifications for her multiple health conditions.   Objective:   Blood pressure 134/79, pulse 84, temperature 98.2 F (36.8 C), height 5\' 7"  (1.702 m), weight 242 lb (109.8 kg), SpO2 98 %. Body mass index is 37.9 kg/m.  General: Cooperative, alert, well developed, in no acute distress. HEENT: Conjunctivae and lids unremarkable. Cardiovascular: Regular rhythm.  Lungs: Normal work of breathing. Neurologic: No focal deficits.   Lab Results  Component Value Date   CREATININE 0.75 04/04/2020   BUN 16 04/04/2020   NA 140 04/04/2020   K 4.2 04/04/2020   CL 100 04/04/2020   CO2 27 04/04/2020   Lab Results  Component Value Date   ALT 28 04/04/2020   AST 21 04/04/2020   ALKPHOS 86 04/04/2020   BILITOT 0.3 04/04/2020   Lab Results  Component Value Date   HGBA1C 6.2 (H) 04/04/2020   HGBA1C 6.0 (H) 09/08/2019   HGBA1C 6.2 (H) 02/04/2019  HGBA1C 5.9 (H) 08/06/2018   HGBA1C 5.9 (H) 02/18/2018   Lab Results  Component Value Date   INSULIN 22.9 04/04/2020   INSULIN 12.6 09/08/2019   INSULIN 23.8 02/04/2019   INSULIN 20.6 08/06/2018   INSULIN 15.2 02/18/2018   Lab Results  Component Value Date   TSH 2.390 09/17/2017   Lab Results  Component Value Date   CHOL 187 04/04/2020   HDL 48 04/04/2020    LDLCALC 95 04/04/2020   TRIG 262 (H) 04/04/2020   CHOLHDL 3.9 04/04/2020   Lab Results  Component Value Date   WBC 7.1 04/15/2019   HGB 13.4 04/15/2019   HCT 41.4 04/15/2019   MCV 83.8 04/15/2019   PLT 297 04/15/2019   No results found for: IRON, TIBC, FERRITIN  Attestation Statements:   Reviewed by clinician on day of visit: allergies, medications, problem list, medical history, surgical history, family history, social history, and previous encounter notes.  Time spent on visit including pre-visit chart review and post-visit care and charting was 30 minutes.    Wilhemena Durie, am acting as transcriptionist for [employer], PA-C.  I have reviewed the above documentation for accuracy and completeness, and I agree with the above. [name], PA-C

## 2020-08-03 ENCOUNTER — Other Ambulatory Visit (INDEPENDENT_AMBULATORY_CARE_PROVIDER_SITE_OTHER): Payer: Self-pay | Admitting: Physician Assistant

## 2020-08-09 ENCOUNTER — Other Ambulatory Visit (HOSPITAL_COMMUNITY): Payer: Self-pay | Admitting: Family Medicine

## 2020-08-09 MED FILL — ESOMEPRAZOLE MAG DR 40 MG C: 40 | 90 days supply | Qty: 90 | Fill #2

## 2020-08-09 MED FILL — rOPINIRole HCL 1 MG TABS: 1 | 90 days supply | Qty: 90 | Fill #3

## 2020-08-09 MED FILL — TRIAMTERENE-HCTZ 37.5-25 MG: 37.5-25 | 90 days supply | Qty: 90 | Fill #0

## 2020-08-10 ENCOUNTER — Other Ambulatory Visit (HOSPITAL_COMMUNITY): Payer: Self-pay | Admitting: Family Medicine

## 2020-08-17 ENCOUNTER — Other Ambulatory Visit (INDEPENDENT_AMBULATORY_CARE_PROVIDER_SITE_OTHER): Payer: Self-pay | Admitting: Physician Assistant

## 2020-08-17 ENCOUNTER — Other Ambulatory Visit: Payer: Self-pay

## 2020-08-17 ENCOUNTER — Ambulatory Visit (INDEPENDENT_AMBULATORY_CARE_PROVIDER_SITE_OTHER): Payer: 59 | Admitting: Physician Assistant

## 2020-08-17 VITALS — BP 131/77 | HR 87 | Temp 98.0°F | Ht 67.0 in | Wt 241.0 lb

## 2020-08-17 DIAGNOSIS — G4733 Obstructive sleep apnea (adult) (pediatric): Secondary | ICD-10-CM | POA: Diagnosis not present

## 2020-08-17 DIAGNOSIS — Z9189 Other specified personal risk factors, not elsewhere classified: Secondary | ICD-10-CM | POA: Diagnosis not present

## 2020-08-17 DIAGNOSIS — R7303 Prediabetes: Secondary | ICD-10-CM | POA: Diagnosis not present

## 2020-08-17 DIAGNOSIS — I10 Essential (primary) hypertension: Secondary | ICD-10-CM | POA: Diagnosis not present

## 2020-08-17 DIAGNOSIS — Z6838 Body mass index (BMI) 38.0-38.9, adult: Secondary | ICD-10-CM | POA: Diagnosis not present

## 2020-08-17 MED ORDER — SAXENDA 18 MG/3ML ~~LOC~~ SOPN: 3.0000 mg | PEN_INJECTOR | Freq: Every day | SUBCUTANEOUS | 0 refills | Status: DC

## 2020-08-17 MED ORDER — METFORMIN HCL 500 MG PO TABS: 500.0000 mg | ORAL_TABLET | Freq: Two times a day (BID) | ORAL | 0 refills | Status: DC

## 2020-08-17 MED FILL — SAXENDA 18 MG/3 ML PEN: 18 | 30 days supply | Qty: 15 | Fill #0

## 2020-08-17 MED FILL — METFORMIN HCL 500 MG TABS: 500 | 30 days supply | Qty: 60 | Fill #0

## 2020-08-21 NOTE — Progress Notes (Signed)
Chief Complaint:   OBESITY Candace [name] is here to discuss her progress with her obesity treatment plan along with follow-up of her obesity related diagnoses. Candace [name] is on the Category 3 Plan + 100 calories and states she is following her eating plan approximately 70% of the time. Candace [name] states she is hiking for 3 hours, and walking for 20 minutes 7 times per week.  Today's visit was #: 47 Starting weight: 249 lbs Starting date: 09/17/2017 Today's weight: 241 lbs Today's date: 08/17/2020 Total lbs lost to date: 8 Total lbs lost since last in-office visit: 1  Interim History: Candace [name] reports that she continues to struggle with dinner, as she eats lunch late and is not hungry when dinner time comes. She decreased her Saxenda to 1.2 mg due to lack of hunger.  Subjective:   1. Pre-diabetes Candace [name] is on metformin twice daily, and she denies polyphagia.  2. Essential hypertension Candace [name] is on Maxzide, and her blood pressure is controlled. She denies headache or chest pain.   3. At risk for diabetes mellitus Candace [name] is at higher than average risk for developing diabetes due to obesity.   Assessment/Plan:   1. Pre-diabetes Candace [name] will continue to work on weight loss, exercise, and decreasing simple carbohydrates to help decrease the risk of diabetes. We will refill metformin for 1 month.  - metFORMIN (GLUCOPHAGE) 500 MG tablet; Take 1 tablet (500 mg total) by mouth 2 (two) times daily with a meal.  Dispense: 60 tablet; Refill: 0  2. Essential hypertension Candace [name] will continue her medications, and we will monitor her blood pressure at each visit. She will continue working on healthy weight loss and exercise to improve blood pressure control.   3. At risk for diabetes mellitus Candace [name] was given approximately 15 minutes of diabetes education and counseling today. We discussed intensive lifestyle modifications today with an emphasis on weight loss as well as increasing exercise and decreasing  simple carbohydrates in her diet. We also reviewed medication options with an emphasis on risk versus benefit of those discussed.   Repetitive spaced learning was employed today to elicit superior memory formation and behavioral change.  4. Class 2 severe obesity with serious comorbidity and body mass index (BMI) of 38.0 to 38.9 in adult, unspecified obesity type (HCC) Candace [name] is currently in the action stage of change. As such, her goal is to continue with weight loss efforts. She has agreed to the Category 3 Plan + 300 calories.   We discussed various medication options to help Candace [name] with her weight loss efforts and we both agreed to continue Saxenda, and we will refill for 1 month.  - Liraglutide -Weight Management (SAXENDA) 18 MG/3ML SOPN; Inject 3 mg into the skin daily.  Dispense: 15 mL; Refill: 0  We will recheck labs at her next visit.  Exercise goals: As is.  Behavioral modification strategies: increasing lean protein intake and no skipping meals.  Candace [name] has agreed to follow-up with our clinic in 2 weeks. She was informed of the importance of frequent follow-up visits to maximize her success with intensive lifestyle modifications for her multiple health conditions.   Objective:   Blood pressure 131/77, pulse 87, temperature 98 F (36.7 C), height 5\' 7"  (1.702 m), weight 241 lb (109.3 kg), SpO2 98 %. Body mass index is 37.75 kg/m.  General: Cooperative, alert, well developed, in no acute distress. HEENT: Conjunctivae and lids unremarkable. Cardiovascular: Regular rhythm.  Lungs: Normal work of breathing. Neurologic: No focal deficits.   Lab  Results  Component Value Date   CREATININE 0.75 04/04/2020   BUN 16 04/04/2020   NA 140 04/04/2020   K 4.2 04/04/2020   CL 100 04/04/2020   CO2 27 04/04/2020   Lab Results  Component Value Date   ALT 28 04/04/2020   AST 21 04/04/2020   ALKPHOS 86 04/04/2020   BILITOT 0.3 04/04/2020   Lab Results  Component Value Date    HGBA1C 6.2 (H) 04/04/2020   HGBA1C 6.0 (H) 09/08/2019   HGBA1C 6.2 (H) 02/04/2019   HGBA1C 5.9 (H) 08/06/2018   HGBA1C 5.9 (H) 02/18/2018   Lab Results  Component Value Date   INSULIN 22.9 04/04/2020   INSULIN 12.6 09/08/2019   INSULIN 23.8 02/04/2019   INSULIN 20.6 08/06/2018   INSULIN 15.2 02/18/2018   Lab Results  Component Value Date   TSH 2.390 09/17/2017   Lab Results  Component Value Date   CHOL 187 04/04/2020   HDL 48 04/04/2020   LDLCALC 95 04/04/2020   TRIG 262 (H) 04/04/2020   CHOLHDL 3.9 04/04/2020   Lab Results  Component Value Date   WBC 7.1 04/15/2019   HGB 13.4 04/15/2019   HCT 41.4 04/15/2019   MCV 83.8 04/15/2019   PLT 297 04/15/2019   No results found for: IRON, TIBC, FERRITIN  Attestation Statements:   Reviewed by clinician on day of visit: allergies, medications, problem list, medical history, surgical history, family history, social history, and previous encounter notes.   Wilhemena Durie, am acting as transcriptionist for [employer], PA-C.  I have reviewed the above documentation for accuracy and completeness, and I agree with the above. [name], PA-C

## 2020-08-31 ENCOUNTER — Other Ambulatory Visit: Payer: Self-pay

## 2020-08-31 ENCOUNTER — Ambulatory Visit (INDEPENDENT_AMBULATORY_CARE_PROVIDER_SITE_OTHER): Payer: 59 | Admitting: Physician Assistant

## 2020-08-31 ENCOUNTER — Encounter (INDEPENDENT_AMBULATORY_CARE_PROVIDER_SITE_OTHER): Payer: Self-pay | Admitting: Physician Assistant

## 2020-08-31 VITALS — BP 135/80 | HR 97 | Temp 98.8°F | Ht 67.0 in | Wt 242.0 lb

## 2020-08-31 DIAGNOSIS — G4733 Obstructive sleep apnea (adult) (pediatric): Secondary | ICD-10-CM | POA: Diagnosis not present

## 2020-08-31 DIAGNOSIS — E559 Vitamin D deficiency, unspecified: Secondary | ICD-10-CM

## 2020-08-31 DIAGNOSIS — Z6838 Body mass index (BMI) 38.0-38.9, adult: Secondary | ICD-10-CM

## 2020-09-04 NOTE — Progress Notes (Signed)
Chief Complaint:   OBESITY Candace [name] is here to discuss her progress with her obesity treatment plan along with follow-up of her obesity related diagnoses. Candace [name] is on the Category 3 Plan + 300 calories and states she is following her eating plan approximately 60-70% of the time. Candace [name] states she is walking for 20 minutes 4 times per week.  Today's visit was #: 71 Starting weight: 249 lbs Starting date: 09/17/2017 Today's weight: 242 lbs Today's date: 08/31/2020 Total lbs lost to date: 7 Total lbs lost since last in-office visit: 0  Interim History: Denna reports that she is being more mindful of her snack choices. She cut her Saxenda back to 1.2 mg due to inability to get all of her food in, and her hunger has returned.  Subjective:   1. Vitamin D deficiency Candace [name] is on 5,000 units Vit D daily.  2. OSA (obstructive sleep apnea) Candace [name] is on CPAP, and she is sleeping well.  Assessment/Plan:   1. Vitamin D deficiency Low Vitamin D level contributes to fatigue and are associated with obesity, breast, and colon cancer. Candace [name] agreed to continue taking Vitamin D 5,000 units daily and will follow-up for routine testing of Vitamin D, at least 2-3 times per year to avoid over-replacement.  2. OSA (obstructive sleep apnea) Intensive lifestyle modifications are the first line treatment for this issue. We discussed several lifestyle modifications today. Candace [name] will continue with CPAP, and will continue to work on diet, exercise and weight loss efforts. We will continue to monitor. Orders and follow up as documented in patient record.   3. Class 2 severe obesity with serious comorbidity and body mass index (BMI) of 38.0 to 38.9 in adult, unspecified obesity type (HCC) Candace [name] is currently in the action stage of change. As such, her goal is to continue with weight loss efforts. She has agreed to following a lower carbohydrate, vegetable and lean protein rich diet plan.   Exercise goals: As  is.  Behavioral modification strategies: increasing lean protein intake and decreasing simple carbohydrates.  Candace [name] has agreed to follow-up with our clinic in 2 weeks. She was informed of the importance of frequent follow-up visits to maximize her success with intensive lifestyle modifications for her multiple health conditions.   Objective:   Blood pressure 135/80, pulse 97, temperature 98.8 F (37.1 C), height 5\' 7"  (1.702 m), weight 242 lb (109.8 kg), SpO2 96 %. Body mass index is 37.9 kg/m.  General: Cooperative, alert, well developed, in no acute distress. HEENT: Conjunctivae and lids unremarkable. Cardiovascular: Regular rhythm.  Lungs: Normal work of breathing. Neurologic: No focal deficits.   Lab Results  Component Value Date   CREATININE 0.75 04/04/2020   BUN 16 04/04/2020   NA 140 04/04/2020   K 4.2 04/04/2020   CL 100 04/04/2020   CO2 27 04/04/2020   Lab Results  Component Value Date   ALT 28 04/04/2020   AST 21 04/04/2020   ALKPHOS 86 04/04/2020   BILITOT 0.3 04/04/2020   Lab Results  Component Value Date   HGBA1C 6.2 (H) 04/04/2020   HGBA1C 6.0 (H) 09/08/2019   HGBA1C 6.2 (H) 02/04/2019   HGBA1C 5.9 (H) 08/06/2018   HGBA1C 5.9 (H) 02/18/2018   Lab Results  Component Value Date   INSULIN 22.9 04/04/2020   INSULIN 12.6 09/08/2019   INSULIN 23.8 02/04/2019   INSULIN 20.6 08/06/2018   INSULIN 15.2 02/18/2018   Lab Results  Component Value Date   TSH 2.390 09/17/2017  Lab Results  Component Value Date   CHOL 187 04/04/2020   HDL 48 04/04/2020   LDLCALC 95 04/04/2020   TRIG 262 (H) 04/04/2020   CHOLHDL 3.9 04/04/2020   Lab Results  Component Value Date   WBC 7.1 04/15/2019   HGB 13.4 04/15/2019   HCT 41.4 04/15/2019   MCV 83.8 04/15/2019   PLT 297 04/15/2019   No results found for: IRON, TIBC, FERRITIN  Attestation Statements:   Reviewed by clinician on day of visit: allergies, medications, problem list, medical history, surgical  history, family history, social history, and previous encounter notes.  Time spent on visit including pre-visit chart review and post-visit care and charting was 33 minutes.    Wilhemena Durie, am acting as transcriptionist for [employer], PA-C.  I have reviewed the above documentation for accuracy and completeness, and I agree with the above. [name], PA-C

## 2020-09-14 ENCOUNTER — Other Ambulatory Visit (HOSPITAL_COMMUNITY): Payer: Self-pay | Admitting: Family Medicine

## 2020-09-14 ENCOUNTER — Ambulatory Visit (INDEPENDENT_AMBULATORY_CARE_PROVIDER_SITE_OTHER): Payer: 59 | Admitting: Physician Assistant

## 2020-09-14 ENCOUNTER — Other Ambulatory Visit: Payer: Self-pay

## 2020-09-14 ENCOUNTER — Encounter (INDEPENDENT_AMBULATORY_CARE_PROVIDER_SITE_OTHER): Payer: Self-pay | Admitting: Physician Assistant

## 2020-09-14 VITALS — BP 145/77 | HR 85 | Temp 98.2°F | Ht 67.0 in | Wt 242.0 lb

## 2020-09-14 DIAGNOSIS — Z9189 Other specified personal risk factors, not elsewhere classified: Secondary | ICD-10-CM

## 2020-09-14 DIAGNOSIS — Z6838 Body mass index (BMI) 38.0-38.9, adult: Secondary | ICD-10-CM

## 2020-09-14 DIAGNOSIS — G2581 Restless legs syndrome: Secondary | ICD-10-CM | POA: Diagnosis not present

## 2020-09-14 DIAGNOSIS — E785 Hyperlipidemia, unspecified: Secondary | ICD-10-CM | POA: Diagnosis not present

## 2020-09-14 DIAGNOSIS — E7849 Other hyperlipidemia: Secondary | ICD-10-CM | POA: Diagnosis not present

## 2020-09-14 DIAGNOSIS — R7303 Prediabetes: Secondary | ICD-10-CM | POA: Diagnosis not present

## 2020-09-14 DIAGNOSIS — F3341 Major depressive disorder, recurrent, in partial remission: Secondary | ICD-10-CM | POA: Diagnosis not present

## 2020-09-14 DIAGNOSIS — I10 Essential (primary) hypertension: Secondary | ICD-10-CM | POA: Diagnosis not present

## 2020-09-14 DIAGNOSIS — F419 Anxiety disorder, unspecified: Secondary | ICD-10-CM | POA: Diagnosis not present

## 2020-09-14 DIAGNOSIS — K219 Gastro-esophageal reflux disease without esophagitis: Secondary | ICD-10-CM | POA: Diagnosis not present

## 2020-09-20 NOTE — Progress Notes (Signed)
Chief Complaint:   OBESITY Candace [name] is here to discuss her progress with her obesity treatment plan along with follow-up of her obesity related diagnoses. Candace [name] is on following a lower carbohydrate, vegetable and lean protein rich diet plan and states she is following her eating plan approximately 70% of the time. Candace [name] states she is walking for 15 minutes 4 times per week.  Today's visit was #: 37 Starting weight: 249 lbs Starting date: 09/17/2017 Today's weight: 242 lbs Today's date: 09/14/2020 Total lbs lost to date: 7 Total lbs lost since last in-office visit: 0  Interim History: Tanita states that the Low carbohydrate plan was harder than she thought it would be. She is on 1.2 mg of Saxenda and she is feeling hungry. She was on Category 4 previously but she was unable to eat all of the food.  Subjective:   1. Pre-diabetes Candace [name] is on metformin, and she is tolerating it well.   2. Other hyperlipidemia Candace [name] is on Crestor, and she denies myalgias or chest pain.  3. At risk for heart disease Candace [name] is at a higher than average risk for cardiovascular disease due to obesity.   Assessment/Plan:   1. Pre-diabetes Candace [name] will continue to work on weight loss, exercise, and decreasing simple carbohydrates to help decrease the risk of diabetes. We will refill metformin 500 mg BID #60 for 1 month.  2. Other hyperlipidemia Cardiovascular risk and specific lipid/LDL goals reviewed. We discussed several lifestyle modifications today. Candace [name] will continue her medications and meal plan, and will increase exercise and weight loss efforts. Orders and follow up as documented in patient record.   Counseling Intensive lifestyle modifications are the first line treatment for this issue. . Dietary changes: Increase soluble fiber. Decrease simple carbohydrates. . Exercise changes: Moderate to vigorous-intensity aerobic activity 150 minutes per week if tolerated. . Lipid-lowering  medications: see documented in medical record.  3. At risk for heart disease Candace [name] was given approximately 15 minutes of coronary artery disease prevention counseling today. She is 60 y.o. female and has risk factors for heart disease including obesity. We discussed intensive lifestyle modifications today with an emphasis on specific weight loss instructions and strategies.   Repetitive spaced learning was employed today to elicit superior memory formation and behavioral change.  4. Class 2 severe obesity with serious comorbidity and body mass index (BMI) of 38.0 to 38.9 in adult, unspecified obesity type (HCC) Candace [name] is currently in the action stage of change. As such, her goal is to continue with weight loss efforts. She has agreed to the Category 3 Plan.   Exercise goals: As is.  Behavioral modification strategies: meal planning and cooking strategies and keeping healthy foods in the home.  Candace [name] has agreed to follow-up with our clinic in 2 weeks. She was informed of the importance of frequent follow-up visits to maximize her success with intensive lifestyle modifications for her multiple health conditions.   Objective:   Blood pressure (!) 145/77, pulse 85, temperature 98.2 F (36.8 C), height 5\' 7"  (1.702 m), weight 242 lb (109.8 kg), SpO2 96 %. Body mass index is 37.9 kg/m.  General: Cooperative, alert, well developed, in no acute distress. HEENT: Conjunctivae and lids unremarkable. Cardiovascular: Regular rhythm.  Lungs: Normal work of breathing. Neurologic: No focal deficits.   Lab Results  Component Value Date   CREATININE 0.75 04/04/2020   BUN 16 04/04/2020   NA 140 04/04/2020   K 4.2 04/04/2020   CL 100 04/04/2020   CO2  27 04/04/2020   Lab Results  Component Value Date   ALT 28 04/04/2020   AST 21 04/04/2020   ALKPHOS 86 04/04/2020   BILITOT 0.3 04/04/2020   Lab Results  Component Value Date   HGBA1C 6.2 (H) 04/04/2020   HGBA1C 6.0 (H) 09/08/2019   HGBA1C  6.2 (H) 02/04/2019   HGBA1C 5.9 (H) 08/06/2018   HGBA1C 5.9 (H) 02/18/2018   Lab Results  Component Value Date   INSULIN 22.9 04/04/2020   INSULIN 12.6 09/08/2019   INSULIN 23.8 02/04/2019   INSULIN 20.6 08/06/2018   INSULIN 15.2 02/18/2018   Lab Results  Component Value Date   TSH 2.390 09/17/2017   Lab Results  Component Value Date   CHOL 187 04/04/2020   HDL 48 04/04/2020   LDLCALC 95 04/04/2020   TRIG 262 (H) 04/04/2020   CHOLHDL 3.9 04/04/2020   Lab Results  Component Value Date   WBC 7.1 04/15/2019   HGB 13.4 04/15/2019   HCT 41.4 04/15/2019   MCV 83.8 04/15/2019   PLT 297 04/15/2019   No results found for: IRON, TIBC, FERRITIN  Attestation Statements:   Reviewed by clinician on day of visit: allergies, medications, problem list, medical history, surgical history, family history, social history, and previous encounter notes.   Wilhemena Durie, am acting as transcriptionist for [employer], PA-C.  I have reviewed the above documentation for accuracy and completeness, and I agree with the above. [name], PA-C

## 2020-09-21 ENCOUNTER — Other Ambulatory Visit (INDEPENDENT_AMBULATORY_CARE_PROVIDER_SITE_OTHER): Payer: Self-pay | Admitting: Physician Assistant

## 2020-09-21 MED ORDER — METFORMIN HCL 500 MG PO TABS: 500.0000 mg | ORAL_TABLET | Freq: Two times a day (BID) | ORAL | 0 refills | Status: DC

## 2020-09-21 MED FILL — METFORMIN HCL 500 MG TABS: 500 | 30 days supply | Qty: 60 | Fill #0

## 2020-09-28 ENCOUNTER — Ambulatory Visit (INDEPENDENT_AMBULATORY_CARE_PROVIDER_SITE_OTHER): Payer: 59 | Admitting: Physician Assistant

## 2020-09-30 ENCOUNTER — Other Ambulatory Visit (HOSPITAL_COMMUNITY): Payer: Self-pay

## 2020-10-10 ENCOUNTER — Encounter (INDEPENDENT_AMBULATORY_CARE_PROVIDER_SITE_OTHER): Payer: Self-pay

## 2020-10-11 ENCOUNTER — Ambulatory Visit (INDEPENDENT_AMBULATORY_CARE_PROVIDER_SITE_OTHER): Payer: 59 | Admitting: Physician Assistant

## 2020-10-24 ENCOUNTER — Other Ambulatory Visit (HOSPITAL_COMMUNITY): Payer: Self-pay

## 2020-10-24 MED ORDER — FLUOXETINE HCL 10 MG PO CAPS
10.0000 mg | ORAL_CAPSULE | Freq: Every day | ORAL | 1 refills | Status: DC
  Filled 2020-10-24: qty 90, 90d supply, fill #0
  Filled 2021-02-01: qty 90, 90d supply, fill #1

## 2020-10-25 ENCOUNTER — Ambulatory Visit
Admission: RE | Admit: 2020-10-25 | Discharge: 2020-10-25 | Disposition: A | Discharge status: Home / Self Care | Payer: 59 | Source: Ambulatory Visit | Attending: Oncology | Admitting: Oncology

## 2020-10-25 ENCOUNTER — Other Ambulatory Visit: Payer: Self-pay | Admitting: Oncology

## 2020-10-25 ENCOUNTER — Other Ambulatory Visit: Payer: Self-pay

## 2020-10-25 DIAGNOSIS — R922 Inconclusive mammogram: Secondary | ICD-10-CM | POA: Diagnosis not present

## 2020-10-25 DIAGNOSIS — N6489 Other specified disorders of breast: Secondary | ICD-10-CM

## 2020-10-30 ENCOUNTER — Telehealth: Payer: Self-pay

## 2020-10-30 NOTE — Telephone Encounter (Signed)
Contacted pt regarding her FU appt with Amy 10/31/20. There is not remote data for her CPAP, called to ask her if she is using it, if so could she bring her equipment with her to her appointment so we can get CPAP data download for review.

## 2020-10-30 NOTE — Patient Instructions (Signed)
Please continue using your CPAP regularly. While your insurance requires that you use CPAP at least 4 hours each night on 70% of the nights, I recommend, that you not skip any nights and use it throughout the night if you can. Getting used to CPAP and staying with the treatment long term does take time and patience and discipline. Untreated obstructive sleep apnea when it is moderate to severe can have an adverse impact on cardiovascular health and raise her risk for heart disease, arrhythmias, hypertension, congestive heart failure, stroke and diabetes. Untreated obstructive sleep apnea causes sleep disruption, nonrestorative sleep, and sleep deprivation. This can have an impact on your day to day functioning and cause daytime sleepiness and impairment of cognitive function, memory loss, mood disturbance, and problems focussing. Using CPAP regularly can improve these symptoms.     Sleep Apnea Sleep apnea affects breathing during sleep. It causes breathing to stop for a short time or to become shallow. It can also increase the risk of:  Heart attack.  Stroke.  Being very overweight (obese).  Diabetes.  Heart failure.  Irregular heartbeat. The goal of treatment is to help you breathe normally again. What are the causes? There are three kinds of sleep apnea:  Obstructive sleep apnea. This is caused by a blocked or collapsed airway.  Central sleep apnea. This happens when the brain does not send the right signals to the muscles that control breathing.  Mixed sleep apnea. This is a combination of obstructive and central sleep apnea. The most common cause of this condition is a collapsed or blocked airway. This can happen if:  Your throat muscles are too relaxed.  Your tongue and tonsils are too large.  You are overweight.  Your airway is too small.   What increases the risk?  Being overweight.  Smoking.  Having a small airway.  Being older.  Being female.  Drinking  alcohol.  Taking medicines to calm yourself (sedatives or tranquilizers).  Having family members with the condition. What are the signs or symptoms?  Trouble staying asleep.  Being sleepy or tired during the day.  Getting angry a lot.  Loud snoring.  Headaches in the morning.  Not being able to focus your mind (concentrate).  Forgetting things.  Less interest in sex.  Mood swings.  Personality changes.  Feelings of sadness (depression).  Waking up a lot during the night to pee (urinate).  Dry mouth.  Sore throat. How is this diagnosed?  Your medical history.  A physical exam.  A test that is done when you are sleeping (sleep study). The test is most often done in a sleep lab but may also be done at home. How is this treated?  Sleeping on your side.  Using a medicine to get rid of mucus in your nose (decongestant).  Avoiding the use of alcohol, medicines to help you relax, or certain pain medicines (narcotics).  Losing weight, if needed.  Changing your diet.  Not smoking.  Using a machine to open your airway while you sleep, such as: ? An oral appliance. This is a mouthpiece that shifts your lower jaw forward. ? A CPAP device. This device blows air through a mask when you breathe out (exhale). ? An EPAP device. This has valves that you put in each nostril. ? A BPAP device. This device blows air through a mask when you breathe in (inhale) and breathe out.  Having surgery if other treatments do not work. It is important to get treatment   for sleep apnea. Without treatment, it can lead to:  High blood pressure.  Coronary artery disease.  In men, not being able to have an erection (impotence).  Reduced thinking ability.   Follow these instructions at home: Lifestyle  Make changes that your doctor recommends.  Eat a healthy diet.  Lose weight if needed.  Avoid alcohol, medicines to help you relax, and some pain medicines.  Do not use any  products that contain nicotine or tobacco, such as cigarettes, e-cigarettes, and chewing tobacco. If you need help quitting, ask your doctor. General instructions  Take over-the-counter and prescription medicines only as told by your doctor.  If you were given a machine to use while you sleep, use it only as told by your doctor.  If you are having surgery, make sure to tell your doctor you have sleep apnea. You may need to bring your device with you.  Keep all follow-up visits as told by your doctor. This is important. Contact a doctor if:  The machine that you were given to use during sleep bothers you or does not seem to be working.  You do not get better.  You get worse. Get help right away if:  Your chest hurts.  You have trouble breathing in enough air.  You have an uncomfortable feeling in your back, arms, or stomach.  You have trouble talking.  One side of your body feels weak.  A part of your face is hanging down. These symptoms may be an emergency. Do not wait to see if the symptoms will go away. Get medical help right away. Call your local emergency services (911 in the [place] Do not drive yourself to the hospital. Summary  This condition affects breathing during sleep.  The most common cause is a collapsed or blocked airway.  The goal of treatment is to help you breathe normally while you sleep. This information is not intended to replace advice given to you by your health care provider. Make sure you discuss any questions you have with your health care provider. Document Revised: 04/03/2018 Document Reviewed: 02/10/2018 Elsevier Patient Education  2021 Elsevier Inc.  

## 2020-10-30 NOTE — Progress Notes (Addendum)
PATIENT: Candace [name] DOB: 08-13-1960  REASON FOR VISIT: follow up HISTORY FROM: patient  Virtual Visit via Telephone Note  I connected with Candace [name] on 10/31/20 at  9:30 AM EDT by telephone and verified that I am speaking with the correct person using two identifiers.   I discussed the limitations, risks, security and privacy concerns of performing an evaluation and management service by telephone and the availability of in person appointments. I also discussed with the patient that there may be a patient responsible charge related to this service. The patient expressed understanding and agreed to proceed.   History of Present Illness:  10/31/20 ALL: Candace [name] is a 60 y.o. female here today for follow up for OSA on CPAP. She admits that she has not used CPAP in several weeks. She reports being frustrated with the tubing and feels that she fights with it at night. She has worked with DME and feels they were very helpful and encouraging but she just became aggravated and didn't want to use it. No concerns with mask or machine.      07/26/20 ALL Candace [name] is a 60 y.o. female here today for follow up for OSA on CPAP.  At our last visit in 03/2020, she was having a difficult time tolerating a full facemask.  Orders were sent for a mask refit. She is now using nasal pillow and feels it is much better tolerated.  She admits that she has not been consistent with CPAP usage.  She suffered a prolonged respiratory illness in December that lasted for over 3 weeks.  She states that she is not been able to get back into the habit of using CPAP.  Over the past few days she has resumed use and is doing very well.  She does note benefit of using CPAP.  Weight management has encouraged her to follow-up with [name] and continue CPAP usage.  Compliance report dated 04/26/2020 through 07/24/2020 shows that she used CPAP 20 of the past 90 days for compliance of 22%.  She used CPAP greater than 4 hours 4  of the past 90 days for compliance of 4%.  Residual AHI was 1.4 on a set pressure of 12 cm of water and EPR of 3.  There was no significant leak noted.   Observations/Objective:  Generalized: Well developed, in no acute distress  Mentation: Alert oriented to time, place, history taking. Follows all commands speech and language fluent   Assessment and Plan:  60 y.o. year old female  has a past medical history of Abnormal Pap smear, Alcohol abuse, Anovulation, Anxiety, Breast cancer ([place]) (04/05/2016), Cancer ([place]) (9562), Complication of anesthesia, Depression, Endometrial hyperplasia, Endometrial polyp, Epidermal cyst, Gallbladder problem, GERD (gastroesophageal reflux disease), H/O hemorrhoids, H/O menorrhagia, High risk HPV infection, Hyperlipidemia, Insomnia, Obesity, Oligomenorrhea, Prediabetes, Restless legs syndrome, Seasonal allergies, and Vitamin D deficiency. here with    ICD-10-CM   1. OSA on CPAP  G47.33    Z99.89      Candace [name] has not used CPAP recently. She reports being frustrated with tubing and did not know if it was worth it. We have reviewed her sleep study results showing severe OSA with total AHI of 53.7/hr and supine AHI of 110.5/hr. She did not reach REM sleep and apnea likely underestimated. We have discussed adverse effects of untreated sleep apnea and other options available for management. She does not qualify for [facility] at this time and does not feel she would want to  consider this option. We discussed concerns of dental device. I do not feel oral appliance will treat the degree of severity of OSA. She does recognize health benefits of treating OSA. She wishes to resume use of CPAP therapy. I have advised her to start with small goals. She will focus on placing therapy for at least 1 hour for the first week then increase times each week. We have discussed daytime use for conditioning as well. She has all supplies needed. She will follow up with me in 6 weeks. She  verbalizes understanding and agreement with this plan.   No orders of the defined types were placed in this encounter.   No orders of the defined types were placed in this encounter.    Follow Up Instructions:  I discussed the assessment and treatment plan with the patient. The patient was provided an opportunity to ask questions and all were answered. The patient agreed with the plan and demonstrated an understanding of the instructions.   The patient was advised to call back or seek an in-person evaluation if the symptoms worsen or if the condition fails to improve as anticipated.  I provided 15 minutes of non-face-to-face time during this encounter. Patient located at their place of residence during [place] visit. Provider is in the office.    Debbora Presto, NP   I reviewed the above note and documentation by the Nurse Practitioner and agree with the history, exam, assessment and plan as outlined above. I was available for consultation. Star Age, MD, PhD [facility])

## 2020-10-31 ENCOUNTER — Encounter: Payer: Self-pay | Admitting: Family Medicine

## 2020-10-31 ENCOUNTER — Telehealth (INDEPENDENT_AMBULATORY_CARE_PROVIDER_SITE_OTHER): Payer: 59 | Admitting: Family Medicine

## 2020-10-31 DIAGNOSIS — G4733 Obstructive sleep apnea (adult) (pediatric): Secondary | ICD-10-CM

## 2020-10-31 DIAGNOSIS — Z9989 Dependence on other enabling machines and devices: Secondary | ICD-10-CM | POA: Diagnosis not present

## 2020-11-01 ENCOUNTER — Encounter (INDEPENDENT_AMBULATORY_CARE_PROVIDER_SITE_OTHER): Payer: Self-pay | Admitting: Physician Assistant

## 2020-11-01 ENCOUNTER — Ambulatory Visit (INDEPENDENT_AMBULATORY_CARE_PROVIDER_SITE_OTHER): Payer: 59 | Admitting: Physician Assistant

## 2020-11-02 ENCOUNTER — Other Ambulatory Visit (HOSPITAL_COMMUNITY): Payer: Self-pay

## 2020-11-02 ENCOUNTER — Other Ambulatory Visit (INDEPENDENT_AMBULATORY_CARE_PROVIDER_SITE_OTHER): Payer: Self-pay | Admitting: Physician Assistant

## 2020-11-02 DIAGNOSIS — R7303 Prediabetes: Secondary | ICD-10-CM

## 2020-11-02 MED FILL — Anastrozole Tab 1 MG: ORAL | 90 days supply | Qty: 90 | Fill #0 | Status: AC

## 2020-11-03 ENCOUNTER — Other Ambulatory Visit (HOSPITAL_COMMUNITY): Payer: Self-pay

## 2020-11-09 ENCOUNTER — Other Ambulatory Visit (HOSPITAL_COMMUNITY): Payer: Self-pay

## 2020-11-10 ENCOUNTER — Other Ambulatory Visit (HOSPITAL_COMMUNITY): Payer: Self-pay

## 2020-11-10 MED FILL — Bupropion HCl Tab ER 24HR 300 MG: ORAL | 90 days supply | Qty: 90 | Fill #0 | Status: AC

## 2020-11-10 MED FILL — Ropinirole Hydrochloride Tab 1 MG: ORAL | 90 days supply | Qty: 90 | Fill #0 | Status: AC

## 2020-11-14 ENCOUNTER — Other Ambulatory Visit (HOSPITAL_COMMUNITY): Payer: Self-pay

## 2020-11-14 ENCOUNTER — Other Ambulatory Visit: Payer: Self-pay

## 2020-11-14 ENCOUNTER — Encounter (INDEPENDENT_AMBULATORY_CARE_PROVIDER_SITE_OTHER): Payer: Self-pay | Admitting: Physician Assistant

## 2020-11-14 ENCOUNTER — Ambulatory Visit (INDEPENDENT_AMBULATORY_CARE_PROVIDER_SITE_OTHER): Payer: 59 | Admitting: Physician Assistant

## 2020-11-14 VITALS — BP 138/82 | HR 77 | Temp 98.1°F | Ht 67.0 in | Wt 241.0 lb

## 2020-11-14 DIAGNOSIS — Z6837 Body mass index (BMI) 37.0-37.9, adult: Secondary | ICD-10-CM

## 2020-11-14 DIAGNOSIS — Z9189 Other specified personal risk factors, not elsewhere classified: Secondary | ICD-10-CM

## 2020-11-14 DIAGNOSIS — R7303 Prediabetes: Secondary | ICD-10-CM

## 2020-11-14 MED ORDER — METFORMIN HCL 500 MG PO TABS
500.0000 mg | ORAL_TABLET | Freq: Two times a day (BID) | ORAL | 0 refills | Status: DC
  Filled 2020-11-14: qty 60, 30d supply, fill #0

## 2020-11-14 NOTE — Progress Notes (Signed)
Chief Complaint:   OBESITY Candace [name] is here to discuss her progress with her obesity treatment plan along with follow-up of her obesity related diagnoses. Candace [name] is on the Category 3 Plan and states she is following her eating plan approximately 60% of the time. Candace [name] states she is walking and at the [facility] for 20 minutes 4 times per week.  Today's visit was #: 29 Starting weight: 249 lbs Starting date: 09/17/2017 Today's weight: 241 lbs Today's date: 11/14/2020 Total lbs lost to date: 8 Total lbs lost since last in-office visit: 1  Interim History: Candace [name] reports that she is not very hungry throughout the day. She is over-snacking at work and getting food from [employer]. She is not eating enough protein.  Subjective:   1. Pre-diabetes Candace [name] is on metformin, and she denies nausea, vomiting, or diarrhea. She denies polyphagia.  2. At risk for diabetes mellitus Candace [name] is at higher than average risk for developing diabetes due to obesity.   Assessment/Plan:   1. Pre-diabetes Candace [name] will continue to work on weight loss, exercise, and decreasing simple carbohydrates to help decrease the risk of diabetes. We will refill metformin for 1 month.  - metFORMIN (GLUCOPHAGE) 500 MG tablet; Take 1 tablet (500 mg total) by mouth 2 (two) times daily with a meal.  Dispense: 60 tablet; Refill: 0  2. At risk for diabetes mellitus Candace [name] was given approximately 15 minutes of diabetes education and counseling today. We discussed intensive lifestyle modifications today with an emphasis on weight loss as well as increasing exercise and decreasing simple carbohydrates in her diet. We also reviewed medication options with an emphasis on risk versus benefit of those discussed.   Repetitive spaced learning was employed today to elicit superior memory formation and behavioral change.  3. Class 2 severe obesity with serious comorbidity and body mass index (BMI) of 37.0 to 37.9 in adult,  unspecified obesity type (HCC) Candace [name] is currently in the action stage of change. As such, her goal is to continue with weight loss efforts. She has agreed to the Category 3 Plan.   We will recheck fasting labs at her next visit.  Exercise goals: As is.  Behavioral modification strategies: increasing lean protein intake and meal planning and cooking strategies.  Candace [name] has agreed to follow-up with our clinic in 2 to 3 weeks. She was informed of the importance of frequent follow-up visits to maximize her success with intensive lifestyle modifications for her multiple health conditions.   Objective:   Blood pressure 138/82, pulse 77, temperature 98.1 F (36.7 C), height 5\' 7"  (1.702 m), weight 241 lb (109.3 kg), SpO2 98 %. Body mass index is 37.75 kg/m.  General: Cooperative, alert, well developed, in no acute distress. HEENT: Conjunctivae and lids unremarkable. Cardiovascular: Regular rhythm.  Lungs: Normal work of breathing. Neurologic: No focal deficits.   Lab Results  Component Value Date   CREATININE 0.75 04/04/2020   BUN 16 04/04/2020   NA 140 04/04/2020   K 4.2 04/04/2020   CL 100 04/04/2020   CO2 27 04/04/2020   Lab Results  Component Value Date   ALT 28 04/04/2020   AST 21 04/04/2020   ALKPHOS 86 04/04/2020   BILITOT 0.3 04/04/2020   Lab Results  Component Value Date   HGBA1C 6.2 (H) 04/04/2020   HGBA1C 6.0 (H) 09/08/2019   HGBA1C 6.2 (H) 02/04/2019   HGBA1C 5.9 (H) 08/06/2018   HGBA1C 5.9 (H) 02/18/2018   Lab Results  Component Value Date  INSULIN 22.9 04/04/2020   INSULIN 12.6 09/08/2019   INSULIN 23.8 02/04/2019   INSULIN 20.6 08/06/2018   INSULIN 15.2 02/18/2018   Lab Results  Component Value Date   TSH 2.390 09/17/2017   Lab Results  Component Value Date   CHOL 187 04/04/2020   HDL 48 04/04/2020   LDLCALC 95 04/04/2020   TRIG 262 (H) 04/04/2020   CHOLHDL 3.9 04/04/2020   Lab Results  Component Value Date   WBC 7.1 04/15/2019   HGB  13.4 04/15/2019   HCT 41.4 04/15/2019   MCV 83.8 04/15/2019   PLT 297 04/15/2019   No results found for: IRON, TIBC, FERRITIN  Attestation Statements:   Reviewed by clinician on day of visit: allergies, medications, problem list, medical history, surgical history, family history, social history, and previous encounter notes.   Wilhemena Durie, am acting as transcriptionist for [employer], PA-C.  I have reviewed the above documentation for accuracy and completeness, and I agree with the above. [name], PA-C

## 2020-11-15 ENCOUNTER — Other Ambulatory Visit (HOSPITAL_COMMUNITY): Payer: Self-pay

## 2020-11-15 MED FILL — Esomeprazole Magnesium Cap Delayed Release 40 MG (Base Eq): ORAL | 90 days supply | Qty: 90 | Fill #0 | Status: AC

## 2020-11-30 ENCOUNTER — Other Ambulatory Visit (HOSPITAL_COMMUNITY): Payer: Self-pay

## 2020-11-30 MED ORDER — TRIAMTERENE-HCTZ 37.5-25 MG PO TABS
ORAL_TABLET | ORAL | 1 refills | Status: DC
  Filled 2020-11-30: qty 90, 90d supply, fill #0

## 2020-12-05 ENCOUNTER — Encounter (INDEPENDENT_AMBULATORY_CARE_PROVIDER_SITE_OTHER): Payer: Self-pay | Admitting: Family Medicine

## 2020-12-05 ENCOUNTER — Other Ambulatory Visit: Payer: Self-pay

## 2020-12-05 ENCOUNTER — Other Ambulatory Visit (HOSPITAL_COMMUNITY): Payer: Self-pay

## 2020-12-05 ENCOUNTER — Ambulatory Visit (INDEPENDENT_AMBULATORY_CARE_PROVIDER_SITE_OTHER): Payer: 59 | Admitting: Family Medicine

## 2020-12-05 VITALS — BP 131/84 | HR 77 | Temp 98.0°F | Ht 67.0 in | Wt 238.0 lb

## 2020-12-05 DIAGNOSIS — E559 Vitamin D deficiency, unspecified: Secondary | ICD-10-CM | POA: Diagnosis not present

## 2020-12-05 DIAGNOSIS — E782 Mixed hyperlipidemia: Secondary | ICD-10-CM | POA: Diagnosis not present

## 2020-12-05 DIAGNOSIS — E7849 Other hyperlipidemia: Secondary | ICD-10-CM | POA: Diagnosis not present

## 2020-12-05 DIAGNOSIS — Z9189 Other specified personal risk factors, not elsewhere classified: Secondary | ICD-10-CM | POA: Diagnosis not present

## 2020-12-05 DIAGNOSIS — R7303 Prediabetes: Secondary | ICD-10-CM

## 2020-12-05 DIAGNOSIS — Z6839 Body mass index (BMI) 39.0-39.9, adult: Secondary | ICD-10-CM | POA: Diagnosis not present

## 2020-12-05 DIAGNOSIS — E66812 Obesity, class 2: Secondary | ICD-10-CM

## 2020-12-05 MED ORDER — LIRAGLUTIDE -WEIGHT MANAGEMENT 18 MG/3ML ~~LOC~~ SOPN
3.0000 mg | PEN_INJECTOR | Freq: Every day | SUBCUTANEOUS | 0 refills | Status: DC
  Filled 2020-12-05 – 2020-12-15 (×2): qty 15, 30d supply, fill #0

## 2020-12-06 ENCOUNTER — Ambulatory Visit (INDEPENDENT_AMBULATORY_CARE_PROVIDER_SITE_OTHER): Payer: 59 | Admitting: Family Medicine

## 2020-12-06 LAB — CMP14+EGFR
ALT: 21 IU/L (ref 0–32)
AST: 14 IU/L (ref 0–40)
Albumin/Globulin Ratio: 2.1 (ref 1.2–2.2)
Albumin: 4.5 g/dL (ref 3.8–4.9)
Alkaline Phosphatase: 88 IU/L (ref 44–121)
BUN/Creatinine Ratio: 20 (ref 9–23)
BUN: 15 mg/dL (ref 6–24)
Bilirubin Total: 0.3 mg/dL (ref 0.0–1.2)
CO2: 26 mmol/L (ref 20–29)
Calcium: 9.8 mg/dL (ref 8.7–10.2)
Chloride: 99 mmol/L (ref 96–106)
Creatinine, Ser: 0.76 mg/dL (ref 0.57–1.00)
Globulin, Total: 2.1 g/dL (ref 1.5–4.5)
Glucose: 93 mg/dL (ref 65–99)
Potassium: 4 mmol/L (ref 3.5–5.2)
Sodium: 139 mmol/L (ref 134–144)
Total Protein: 6.6 g/dL (ref 6.0–8.5)
eGFR: 90 mL/min/{1.73_m2} (ref >59)

## 2020-12-06 LAB — LIPID PANEL WITH LDL/HDL RATIO
Cholesterol, Total: 172 mg/dL (ref 100–199)
HDL: 51 mg/dL (ref >39)
LDL Chol Calc (NIH): 83 mg/dL (ref 0–99)
LDL/HDL Ratio: 1.6 ratio (ref 0.0–3.2)
Triglycerides: 229 mg/dL — ABNORMAL HIGH (ref 0–149)
VLDL Cholesterol Cal: 38 mg/dL (ref 5–40)

## 2020-12-06 LAB — TSH: TSH: 2.2 u[IU]/mL (ref 0.450–4.500)

## 2020-12-06 LAB — INSULIN, RANDOM: INSULIN: 13.5 u[IU]/mL (ref 2.6–24.9)

## 2020-12-06 LAB — VITAMIN D 25 HYDROXY (VIT D DEFICIENCY, FRACTURES): Vit D, 25-Hydroxy: 60.1 ng/mL (ref 30.0–100.0)

## 2020-12-06 LAB — HEMOGLOBIN A1C
Est. average glucose Bld gHb Est-mCnc: 131 mg/dL
Hgb A1c MFr Bld: 6.2 % — ABNORMAL HIGH (ref 4.8–5.6)

## 2020-12-07 ENCOUNTER — Other Ambulatory Visit (HOSPITAL_COMMUNITY): Payer: Self-pay

## 2020-12-12 ENCOUNTER — Other Ambulatory Visit (HOSPITAL_COMMUNITY): Payer: Self-pay

## 2020-12-12 DIAGNOSIS — Z01419 Encounter for gynecological examination (general) (routine) without abnormal findings: Secondary | ICD-10-CM | POA: Diagnosis not present

## 2020-12-13 NOTE — Progress Notes (Signed)
Chief Complaint:   OBESITY Candace [name] is here to discuss her progress with her obesity treatment plan along with follow-up of her obesity related diagnoses. Candace [name] is on the Category 3 Plan and states she is following her eating plan approximately 70% of the time. Candace [name] states she is walking and on the elliptical for 20-30 minutes 3 times per week.  Today's visit was #: 62 Starting weight: 249 lbs Starting date: 09/17/2017 Today's weight: 238 lbs Today's date: 12/05/2020 Total lbs lost to date: 11 Total lbs lost since last in-office visit: 3  Interim History: Candace [name] continues to do well with weight loss on her Category 3 plan. She is getting better at eating all of the food on her plan and she has done well with decreasing sugar.  Subjective:   1. Pre-diabetes Candace [name] continues to do well with diet and weight loss. She is doing well on her GLP-1 and she is due for labs.  2. Other hyperlipidemia Candace [name] is working on decreasing cholesterol with diet and exercise. She is due for labs.  3. Vitamin D deficiency Candace [name] is on Vit D, and she is due to have labs checked. She is at risk of over-replacement at some time.  4. At risk for diabetes mellitus Candace [name] is at higher than average risk for developing diabetes due to obesity.   Assessment/Plan:   1. Pre-diabetes Candace [name] will continue to work on diet, exercise, and decreasing simple carbohydrates to help decrease the risk of diabetes. We will check labs today.  - Insulin, random - Hemoglobin A1c - TSH  2. Other hyperlipidemia Cardiovascular risk and specific lipid/LDL goals reviewed. We discussed several lifestyle modifications today. We will check labs today. Candace [name] will continue to work on diet, exercise and weight loss efforts. Orders and follow up as documented in patient record.   - CMP14+EGFR - Lipid Panel With LDL/HDL Ratio - TSH  3. Vitamin D deficiency Low Vitamin D level contributes to fatigue and are associated with  obesity, breast, and colon cancer. We will check labs today. Bethanny will follow-up for routine testing of Vitamin D, at least 2-3 times per year to avoid over-replacement.  - VITAMIN D 25 Hydroxy (Vit-D Deficiency, Fractures)  4. At risk for diabetes mellitus Candace [name] was given approximately 15 minutes of diabetes education and counseling today. We discussed intensive lifestyle modifications today with an emphasis on weight loss as well as increasing exercise and decreasing simple carbohydrates in her diet. We also reviewed medication options with an emphasis on risk versus benefit of those discussed.   Repetitive spaced learning was employed today to elicit superior memory formation and behavioral change.  5. Obesity with current BMI 37.4 Candace [name] is currently in the action stage of change. As such, her goal is to continue with weight loss efforts. She has agreed to the Category 3 Plan.   We discussed various medication options to help Candace [name] with her weight loss efforts and we both agreed to continue Saxenda, and we will refill for 1 month.  - Liraglutide -Weight Management 18 MG/3ML SOPN; Inject 3 mg into the skin daily.  Dispense: 15 mL; Refill: 0  Exercise goals: As is.  Behavioral modification strategies: meal planning and cooking strategies, travel eating strategies, and celebration eating strategies.  Candace [name] has agreed to follow-up with our clinic in 3 weeks. She was informed of the importance of frequent follow-up visits to maximize her success with intensive lifestyle modifications for her multiple health conditions.   Candace [name] was informed we  would discuss her lab results at her next visit unless there is a critical issue that needs to be addressed sooner. Candace [name] agreed to keep her next visit at the agreed upon time to discuss these results.  Objective:   Blood pressure 131/84, pulse 77, temperature 98 F (36.7 C), height _0  (1.702 m), weight 238 lb (108 kg), SpO2 97 %. Body mass  index is 37.28 kg/m.  General: Cooperative, alert, well developed, in no acute distress. HEENT: Conjunctivae and lids unremarkable. Cardiovascular: Regular rhythm.  Lungs: Normal work of breathing. Neurologic: No focal deficits.   Lab Results  Component Value Date   CREATININE 0.76 12/05/2020   BUN 15 12/05/2020   NA 139 12/05/2020   K 4.0 12/05/2020   CL 99 12/05/2020   CO2 26 12/05/2020   Lab Results  Component Value Date   ALT 21 12/05/2020   AST 14 12/05/2020   ALKPHOS 88 12/05/2020   BILITOT 0.3 12/05/2020   Lab Results  Component Value Date   HGBA1C 6.2 (H) 12/05/2020   HGBA1C 6.2 (H) 04/04/2020   HGBA1C 6.0 (H) 09/08/2019   HGBA1C 6.2 (H) 02/04/2019   HGBA1C 5.9 (H) 08/06/2018   Lab Results  Component Value Date   INSULIN 13.5 12/05/2020   INSULIN 22.9 04/04/2020   INSULIN 12.6 09/08/2019   INSULIN 23.8 02/04/2019   INSULIN 20.6 08/06/2018   Lab Results  Component Value Date   TSH 2.200 12/05/2020   Lab Results  Component Value Date   CHOL 172 12/05/2020   HDL 51 12/05/2020   LDLCALC 83 12/05/2020   TRIG 229 (H) 12/05/2020   CHOLHDL 3.9 04/04/2020   Lab Results  Component Value Date   WBC 7.1 04/15/2019   HGB 13.4 04/15/2019   HCT 41.4 04/15/2019   MCV 83.8 04/15/2019   PLT 297 04/15/2019   No results found for: IRON, TIBC, FERRITIN  Attestation Statements:   Reviewed by clinician on day of visit: allergies, medications, problem list, medical history, surgical history, family history, social history, and previous encounter notes.   I, Trixie Dredge, am acting as transcriptionist for Dennard Nip, MD.  I have reviewed the above documentation for accuracy and completeness, and I agree with the above. -  Dennard Nip, MD

## 2020-12-14 ENCOUNTER — Other Ambulatory Visit (INDEPENDENT_AMBULATORY_CARE_PROVIDER_SITE_OTHER): Payer: Self-pay | Admitting: Physician Assistant

## 2020-12-14 ENCOUNTER — Other Ambulatory Visit (HOSPITAL_COMMUNITY): Payer: Self-pay

## 2020-12-14 DIAGNOSIS — R7303 Prediabetes: Secondary | ICD-10-CM

## 2020-12-14 MED ORDER — METFORMIN HCL 500 MG PO TABS
500.0000 mg | ORAL_TABLET | Freq: Two times a day (BID) | ORAL | 0 refills | Status: DC
Start: 2020-12-14 — End: 2021-01-10
  Filled 2020-12-14: qty 60, 30d supply, fill #0

## 2020-12-14 NOTE — Telephone Encounter (Signed)
Dr Leafy Ro  Refill request

## 2020-12-15 ENCOUNTER — Other Ambulatory Visit (HOSPITAL_COMMUNITY): Payer: Self-pay

## 2020-12-18 ENCOUNTER — Other Ambulatory Visit (HOSPITAL_COMMUNITY): Payer: Self-pay

## 2020-12-18 ENCOUNTER — Telehealth: Payer: Self-pay

## 2020-12-18 NOTE — Telephone Encounter (Signed)
Called and LVM, pt has not been compliant with her CPAP. Calling to see if she wants to continue CPAP tx, (keep appt) or discontinue tx (cancel appt).

## 2020-12-19 ENCOUNTER — Telehealth: Payer: 59 | Admitting: Family Medicine

## 2020-12-19 ENCOUNTER — Other Ambulatory Visit (HOSPITAL_COMMUNITY): Payer: Self-pay

## 2020-12-19 MED ORDER — ROSUVASTATIN CALCIUM 10 MG PO TABS
10.0000 mg | ORAL_TABLET | Freq: Every evening | ORAL | 0 refills | Status: DC
  Filled 2020-12-19 (×2): qty 90, 90d supply, fill #0

## 2020-12-19 NOTE — Progress Notes (Deleted)
PATIENT: Candace [name] DOB: January 01, 1961  REASON FOR VISIT: follow up HISTORY FROM: patient  Virtual Visit via Telephone Note  I connected with Candace [name] on 12/19/20 at  9:15 AM EDT by telephone and verified that I am speaking with the correct person using two identifiers.   I discussed the limitations, risks, security and privacy concerns of performing an evaluation and management service by telephone and the availability of in person appointments. I also discussed with the patient that there may be a patient responsible charge related to this service. The patient expressed understanding and agreed to proceed.   History of Present Illness:  12/19/20 ALL: Candace [name] returns for follow up for severe OSA. She was started on CPAP therapy but has not been able to tolerate.      10/31/2020 ALL:  Candace [name] is a 60 y.o. female here today for follow up for OSA on CPAP. She admits that she has not used CPAP in several weeks. She reports being frustrated with the tubing and feels that she fights with it at night. She has worked with DME and feels they were very helpful and encouraging but she just became aggravated and didn't want to use it. No concerns with mask or machine.      07/26/20 ALL Candace [name] is a 60 y.o. female here today for follow up for OSA on CPAP.  At our last visit in 03/2020, she was having a difficult time tolerating a full facemask.  Orders were sent for a mask refit. She is now using nasal pillow and feels it is much better tolerated.  She admits that she has not been consistent with CPAP usage.  She suffered a prolonged respiratory illness in December that lasted for over 3 weeks.  She states that she is not been able to get back into the habit of using CPAP.  Over the past few days she has resumed use and is doing very well.  She does note benefit of using CPAP.  Weight management has encouraged her to follow-up with [name] and continue CPAP usage.   Compliance report dated  04/26/2020 through 07/24/2020 shows that she used CPAP 20 of the past 90 days for compliance of 22%.  She used CPAP greater than 4 hours 4 of the past 90 days for compliance of 4%.  Residual AHI was 1.4 on a set pressure of 12 cm of water and EPR of 3.  There was no significant leak noted.   Observations/Objective:  Generalized: Well developed, in no acute distress  Mentation: Alert oriented to time, place, history taking. Follows all commands speech and language fluent   Assessment and Plan:  60 y.o. year old female  has a past medical history of Abnormal Pap smear, Alcohol abuse, Anovulation, Anxiety, Breast cancer ([place]) (04/05/2016), Cancer ([place]) (0712), Complication of anesthesia, Depression, Endometrial hyperplasia, Endometrial polyp, Epidermal cyst, Gallbladder problem, GERD (gastroesophageal reflux disease), H/O hemorrhoids, H/O menorrhagia, High risk HPV infection, Hyperlipidemia, Insomnia, Obesity, Oligomenorrhea, Prediabetes, Restless legs syndrome, Seasonal allergies, and Vitamin D deficiency. here with  No diagnosis found.    Candace [name] has not used CPAP recently. She reports being frustrated with tubing and did not know if it was worth it. We have reviewed her sleep study results showing severe OSA with total AHI of 53.7/hr and supine AHI of 110.5/hr. She did not reach REM sleep and apnea likely underestimated. We have discussed adverse effects of untreated sleep apnea and other options available for management. She  does not qualify for [facility] at this time and does not feel she would want to consider this option. We discussed concerns of dental device. I do not feel oral appliance will treat the degree of severity of OSA. She does recognize health benefits of treating OSA. She wishes to resume use of CPAP therapy. I have advised her to start with small goals. She will focus on placing therapy for at least 1 hour for the first week then increase times each week. We have discussed daytime use  for conditioning as well. She has all supplies needed. She will follow up with me in 6 weeks. She verbalizes understanding and agreement with this plan.    No orders of the defined types were placed in this encounter.   No orders of the defined types were placed in this encounter.    Follow Up Instructions:  I discussed the assessment and treatment plan with the patient. The patient was provided an opportunity to ask questions and all were answered. The patient agreed with the plan and demonstrated an understanding of the instructions.   The patient was advised to call back or seek an in-person evaluation if the symptoms worsen or if the condition fails to improve as anticipated.  I provided 15 minutes of non-face-to-face time during this encounter. Patient located at their place of residence during [place] visit. Provider is in the office.    Debbora Presto, NP

## 2020-12-20 ENCOUNTER — Telehealth (INDEPENDENT_AMBULATORY_CARE_PROVIDER_SITE_OTHER): Payer: Self-pay | Admitting: Emergency Medicine

## 2020-12-20 ENCOUNTER — Other Ambulatory Visit (HOSPITAL_COMMUNITY): Payer: Self-pay

## 2020-12-20 ENCOUNTER — Telehealth: Payer: Self-pay | Admitting: Family Medicine

## 2020-12-20 NOTE — Telephone Encounter (Signed)
Pt called stating that the cpap machine is not working for her and she would like to return in. Pt would like to speak to the RN so she can be advised on how she can return cpap machine.

## 2020-12-20 NOTE — Telephone Encounter (Signed)
Prior auth for saxenda 92ml per 30 day : Approved from 12/14/20-04/14/21

## 2020-12-20 NOTE — Telephone Encounter (Signed)
Spoke to patient and she wishes to d/c her cpap treatment. Pt states it was keeping her from sleeping and felt she is sleeping better now. States it didn't fit her "life style" and after trying for over a year she has done all she could and wishes to not try any other alternative and understands the risk factors from not treating her OSA. Notified pt we would notify her DME and they will be reaching out to her to further explain the discontinuation.

## 2020-12-22 ENCOUNTER — Other Ambulatory Visit (HOSPITAL_COMMUNITY): Payer: Self-pay

## 2020-12-26 ENCOUNTER — Other Ambulatory Visit (HOSPITAL_COMMUNITY): Payer: Self-pay

## 2020-12-27 ENCOUNTER — Other Ambulatory Visit (HOSPITAL_COMMUNITY): Payer: Self-pay

## 2020-12-27 ENCOUNTER — Other Ambulatory Visit: Payer: Self-pay

## 2020-12-27 ENCOUNTER — Ambulatory Visit (INDEPENDENT_AMBULATORY_CARE_PROVIDER_SITE_OTHER): Payer: 59 | Admitting: Physician Assistant

## 2020-12-27 VITALS — BP 149/84 | HR 86 | Temp 98.1°F | Ht 67.0 in | Wt 239.0 lb

## 2020-12-27 DIAGNOSIS — Z6838 Body mass index (BMI) 38.0-38.9, adult: Secondary | ICD-10-CM | POA: Diagnosis not present

## 2020-12-27 DIAGNOSIS — R7303 Prediabetes: Secondary | ICD-10-CM

## 2020-12-27 DIAGNOSIS — Z9189 Other specified personal risk factors, not elsewhere classified: Secondary | ICD-10-CM

## 2020-12-27 MED ORDER — UNIFINE PENTIPS 32G X 4 MM MISC
1.0000 | Freq: Every day | 0 refills | Status: DC
  Filled 2020-12-27: qty 100, 90d supply, fill #0

## 2020-12-28 ENCOUNTER — Other Ambulatory Visit (HOSPITAL_COMMUNITY): Payer: Self-pay

## 2020-12-28 NOTE — Progress Notes (Signed)
Chief Complaint:   OBESITY Candace [name] is here to discuss her progress with her obesity treatment plan along with follow-up of her obesity related diagnoses. Candace [name] is on the Category 3 Plan and states she is following her eating plan approximately 60% of the time. Candace [name] states she is doing cardio for 50 minutes 3 times per week.  Today's visit was #: 57 Starting weight: 249 lbs Starting date: 09/17/2017 Today's weight: 239 lbs Today's date: 12/27/2020 Total lbs lost to date: 10 Total lbs lost since last in-office visit: 0  Interim History: Candace [name] reports being distracted from the program. She ate a lot of junk over the weekend at work. She is taking 1.2 mg of Saxenda. She is not wearing her CPAP as she cannot tolerate the mask, and she has tried multiple options. She spoke with her Neurologist in regards to her CPAP.  Subjective:   1. Pre-diabetes Candace [name] is on metformin BID, and she is tolerating it well. Her last A1c was 6.2. I discussed labs with the patient today.  2. At risk for diabetes mellitus Candace [name] is at higher than average risk for developing diabetes due to obesity.   Assessment/Plan:   1. Pre-diabetes Candace [name] will continue metformin, and will continue to work on weight loss, exercise, and decreasing simple carbohydrates to help decrease the risk of diabetes.   2. At risk for diabetes mellitus Candace [name] was given approximately 15 minutes of diabetes education and counseling today. We discussed intensive lifestyle modifications today with an emphasis on weight loss as well as increasing exercise and decreasing simple carbohydrates in her diet. We also reviewed medication options with an emphasis on risk versus benefit of those discussed.   Repetitive spaced learning was employed today to elicit superior memory formation and behavioral change.  3. Class 2 severe obesity with serious comorbidity and body mass index (BMI) of 38.0 to 38.9 in adult, unspecified obesity type  (HCC) Candace [name] is currently in the action stage of change. As such, her goal is to continue with weight loss efforts. She has agreed to the Category 3 Plan.   We discussed various medication options to help Candace [name] with her weight loss efforts and we both agreed to continue Saxenda, and we will refill pen needles #100 with no refills.  - Insulin Pen Needle (UNIFINE PENTIPS) 32G X 4 MM MISC; Use as directed once daily  Dispense: 100 each; Refill: 0  Exercise goals: As is.  Behavioral modification strategies: decreasing simple carbohydrates and meal planning and cooking strategies.  Candace [name] has agreed to follow-up with our clinic in 2 weeks. She was informed of the importance of frequent follow-up visits to maximize her success with intensive lifestyle modifications for her multiple health conditions.   Objective:   Blood pressure (!) 149/84, pulse 86, temperature 98.1 F (36.7 C), height 5\' 7"  (1.702 m), weight 239 lb (108.4 kg), SpO2 99 %. Body mass index is 37.43 kg/m.  General: Cooperative, alert, well developed, in no acute distress. HEENT: Conjunctivae and lids unremarkable. Cardiovascular: Regular rhythm.  Lungs: Normal work of breathing. Neurologic: No focal deficits.   Lab Results  Component Value Date   CREATININE 0.76 12/05/2020   BUN 15 12/05/2020   NA 139 12/05/2020   K 4.0 12/05/2020   CL 99 12/05/2020   CO2 26 12/05/2020   Lab Results  Component Value Date   ALT 21 12/05/2020   AST 14 12/05/2020   ALKPHOS 88 12/05/2020   BILITOT 0.3 12/05/2020   Lab Results  Component Value Date   HGBA1C 6.2 (H) 12/05/2020   HGBA1C 6.2 (H) 04/04/2020   HGBA1C 6.0 (H) 09/08/2019   HGBA1C 6.2 (H) 02/04/2019   HGBA1C 5.9 (H) 08/06/2018   Lab Results  Component Value Date   INSULIN 13.5 12/05/2020   INSULIN 22.9 04/04/2020   INSULIN 12.6 09/08/2019   INSULIN 23.8 02/04/2019   INSULIN 20.6 08/06/2018   Lab Results  Component Value Date   TSH 2.200 12/05/2020   Lab  Results  Component Value Date   CHOL 172 12/05/2020   HDL 51 12/05/2020   LDLCALC 83 12/05/2020   TRIG 229 (H) 12/05/2020   CHOLHDL 3.9 04/04/2020   Lab Results  Component Value Date   VD25OH 60.1 12/05/2020   VD25OH 41.2 04/04/2020   VD25OH 42.5 09/08/2019   Lab Results  Component Value Date   WBC 7.1 04/15/2019   HGB 13.4 04/15/2019   HCT 41.4 04/15/2019   MCV 83.8 04/15/2019   PLT 297 04/15/2019   No results found for: IRON, TIBC, FERRITIN  Attestation Statements:   Reviewed by clinician on day of visit: allergies, medications, problem list, medical history, surgical history, family history, social history, and previous encounter notes.   Wilhemena Durie, am acting as transcriptionist for [employer], PA-C.  I have reviewed the above documentation for accuracy and completeness, and I agree with the above. [name], PA-C

## 2021-01-10 ENCOUNTER — Other Ambulatory Visit (HOSPITAL_COMMUNITY): Payer: Self-pay

## 2021-01-10 ENCOUNTER — Encounter (INDEPENDENT_AMBULATORY_CARE_PROVIDER_SITE_OTHER): Payer: Self-pay | Admitting: Physician Assistant

## 2021-01-10 ENCOUNTER — Other Ambulatory Visit: Payer: Self-pay

## 2021-01-10 ENCOUNTER — Ambulatory Visit (INDEPENDENT_AMBULATORY_CARE_PROVIDER_SITE_OTHER): Payer: 59 | Admitting: Physician Assistant

## 2021-01-10 VITALS — BP 139/81 | HR 85 | Temp 98.3°F | Ht 67.0 in | Wt 243.0 lb

## 2021-01-10 DIAGNOSIS — I10 Essential (primary) hypertension: Secondary | ICD-10-CM | POA: Diagnosis not present

## 2021-01-10 DIAGNOSIS — Z6838 Body mass index (BMI) 38.0-38.9, adult: Secondary | ICD-10-CM

## 2021-01-10 DIAGNOSIS — Z9189 Other specified personal risk factors, not elsewhere classified: Secondary | ICD-10-CM | POA: Diagnosis not present

## 2021-01-10 DIAGNOSIS — R7303 Prediabetes: Secondary | ICD-10-CM

## 2021-01-10 MED ORDER — METFORMIN HCL 500 MG PO TABS
500.0000 mg | ORAL_TABLET | Freq: Two times a day (BID) | ORAL | 0 refills | Status: DC
  Filled 2021-01-10: qty 60, 30d supply, fill #0

## 2021-01-17 NOTE — Progress Notes (Signed)
Chief Complaint:   OBESITY Candace [name] is here to discuss her progress with her obesity treatment plan along with follow-up of her obesity related diagnoses. Candace [name] is on the Category 3 Plan and states she is following her eating plan approximately 75% of the time. Candace [name] states she is doing 0 minutes 0 times per week.  Today's visit was #: 48 Starting weight: 249 lbs Starting date: 09/17/2017 Today's weight: 243 lbs Today's date: 01/10/2021 Total lbs lost to date: 6 Total lbs lost since last in-office visit: 0  Interim History: Candace [name] reports that there were a couple of days that she didn't eat everything on the plan. She overindulged on pound cake twice and then skipped dinner. She is up 5 lbs of water today. She is on Saxenda 1.2 mg.  Subjective:   1. Pre-diabetes Candace [name] is on metformin twice daily, and she denies polyphagia.  2. Essential hypertension Candace [name]'s blood pressure is borderline today. She is taking her medications as prescribed. She denies chest pain.  3. At risk for heart disease Candace [name] is at a higher than average risk for cardiovascular disease due to obesity.   Assessment/Plan:   1. Pre-diabetes Candace [name] will continue to work on weight loss, exercise, and decreasing simple carbohydrates to help decrease the risk of diabetes. We will refill metformin for 1 month.  - metFORMIN (GLUCOPHAGE) 500 MG tablet; Take 1 tablet (500 mg total) by mouth 2 (two) times daily with a meal.  Dispense: 60 tablet; Refill: 0  2. Essential hypertension Candace [name] will continue her medications and meal plan, and will continue healthy weight loss and exercise to improve blood pressure control. We will watch for signs of hypotension as she continues her lifestyle modifications.  3. At risk for heart disease Candace [name] was given approximately 15 minutes of coronary artery disease prevention counseling today. She is 60 y.o. female and has risk factors for heart disease including obesity. We  discussed intensive lifestyle modifications today with an emphasis on specific weight loss instructions and strategies.   Repetitive spaced learning was employed today to elicit superior memory formation and behavioral change.  4. Class 2 severe obesity with serious comorbidity and body mass index (BMI) of 38.0 to 38.9 in adult, unspecified obesity type (Candace [name]), current bmi 38.05 Candace [name] is currently in the action stage of change. As such, her goal is to continue with weight loss efforts. She has agreed to the Category 3 Plan.   Exercise goals: No exercise has been prescribed at this time.  Behavioral modification strategies: meal planning and cooking strategies and keeping healthy foods in the home.  Candace [name] has agreed to follow-up with our clinic in 3 weeks. She was informed of the importance of frequent follow-up visits to maximize her success with intensive lifestyle modifications for her multiple health conditions.   Objective:   Blood pressure 139/81, pulse 85, temperature 98.3 F (36.8 C), height 5\' 7"  (1.702 m), weight 243 lb (110.2 kg), SpO2 100 %. Body mass index is 38.06 kg/m.  General: Cooperative, alert, well developed, in no acute distress. HEENT: Conjunctivae and lids unremarkable. Cardiovascular: Regular rhythm.  Lungs: Normal work of breathing. Neurologic: No focal deficits.   Lab Results  Component Value Date   CREATININE 0.76 12/05/2020   BUN 15 12/05/2020   NA 139 12/05/2020   K 4.0 12/05/2020   CL 99 12/05/2020   CO2 26 12/05/2020   Lab Results  Component Value Date   ALT 21 12/05/2020   AST 14 12/05/2020  ALKPHOS 88 12/05/2020   BILITOT 0.3 12/05/2020   Lab Results  Component Value Date   HGBA1C 6.2 (H) 12/05/2020   HGBA1C 6.2 (H) 04/04/2020   HGBA1C 6.0 (H) 09/08/2019   HGBA1C 6.2 (H) 02/04/2019   HGBA1C 5.9 (H) 08/06/2018   Lab Results  Component Value Date   INSULIN 13.5 12/05/2020   INSULIN 22.9 04/04/2020   INSULIN 12.6 09/08/2019    INSULIN 23.8 02/04/2019   INSULIN 20.6 08/06/2018   Lab Results  Component Value Date   TSH 2.200 12/05/2020   Lab Results  Component Value Date   CHOL 172 12/05/2020   HDL 51 12/05/2020   LDLCALC 83 12/05/2020   TRIG 229 (H) 12/05/2020   CHOLHDL 3.9 04/04/2020   Lab Results  Component Value Date   VD25OH 60.1 12/05/2020   VD25OH 41.2 04/04/2020   VD25OH 42.5 09/08/2019   Lab Results  Component Value Date   WBC 7.1 04/15/2019   HGB 13.4 04/15/2019   HCT 41.4 04/15/2019   MCV 83.8 04/15/2019   PLT 297 04/15/2019   No results found for: IRON, TIBC, FERRITIN  Attestation Statements:   Reviewed by clinician on day of visit: allergies, medications, problem list, medical history, surgical history, family history, social history, and previous encounter notes.   Wilhemena Durie, am acting as transcriptionist for [employer], PA-C.  I have reviewed the above documentation for accuracy and completeness, and I agree with the above. [name], PA-C

## 2021-01-31 ENCOUNTER — Ambulatory Visit (INDEPENDENT_AMBULATORY_CARE_PROVIDER_SITE_OTHER): Payer: 59 | Admitting: Adult Health

## 2021-01-31 ENCOUNTER — Encounter (INDEPENDENT_AMBULATORY_CARE_PROVIDER_SITE_OTHER): Payer: Self-pay | Admitting: Adult Health

## 2021-01-31 ENCOUNTER — Other Ambulatory Visit: Payer: Self-pay

## 2021-01-31 ENCOUNTER — Other Ambulatory Visit (HOSPITAL_COMMUNITY): Payer: Self-pay

## 2021-01-31 VITALS — BP 150/80 | HR 91 | Temp 97.8°F | Ht 67.0 in | Wt 242.0 lb

## 2021-01-31 DIAGNOSIS — Z6838 Body mass index (BMI) 38.0-38.9, adult: Secondary | ICD-10-CM

## 2021-01-31 DIAGNOSIS — Z9189 Other specified personal risk factors, not elsewhere classified: Secondary | ICD-10-CM

## 2021-01-31 DIAGNOSIS — I10 Essential (primary) hypertension: Secondary | ICD-10-CM | POA: Diagnosis not present

## 2021-01-31 DIAGNOSIS — R7303 Prediabetes: Secondary | ICD-10-CM

## 2021-01-31 MED ORDER — METFORMIN HCL 500 MG PO TABS
500.0000 mg | ORAL_TABLET | Freq: Two times a day (BID) | ORAL | 0 refills | Status: DC
  Filled 2021-01-31: qty 60, 30d supply, fill #0

## 2021-02-01 ENCOUNTER — Other Ambulatory Visit (HOSPITAL_COMMUNITY): Payer: Self-pay

## 2021-02-01 MED FILL — Fluoxetine HCl Tab 10 MG: ORAL | 90 days supply | Qty: 90 | Fill #0 | Status: CN

## 2021-02-05 ENCOUNTER — Other Ambulatory Visit (HOSPITAL_COMMUNITY): Payer: Self-pay

## 2021-02-05 NOTE — Progress Notes (Signed)
Chief Complaint:   OBESITY Candace [name] is here to discuss her progress with her obesity treatment plan along with follow-up of her obesity related diagnoses. Candace [name] is on the Category 3 Plan and states she is following her eating plan approximately 75% of the time. Candace [name] states she is not exercising regularly.  Today's visit was #: 49 Starting weight: 249 lbs Starting date: 09/17/2017 Today's weight: 242 lbs Today's date: 01/31/2021 Total lbs lost to date: 7 lbs Total lbs lost since last in-office visit: 1 lb  Interim History: Keyri says that it continues to be a challenge for her to consume all protein/vegetables on plan at dinner.  She is on Saxenda 1.2 mg daily - denies mass in neck, dysphagia, dyspepsia, or persistent hoarseness.    Of note - She exercised rigorously prior to her pregnancies over 19 years ago.  Subjective:   1. Pre-diabetes On 12/05/2020, CMP showed GFR of 90, A1c 6.2 with normal BG of 93 and elevated insulin level of 13.5.  she is on metformin 500 mg twice daily.  Can experience loose stools with increased carbohydrate/sugar intake.  Lab Results  Component Value Date   HGBA1C 6.2 (H) 12/05/2020   Lab Results  Component Value Date   INSULIN 13.5 12/05/2020   INSULIN 22.9 04/04/2020   INSULIN 12.6 09/08/2019   INSULIN 23.8 02/04/2019   INSULIN 20.6 08/06/2018   2. Essential hypertension She just consumed 16 ounces Dr. Malachi Bonds - Diet - just prior to office visit.  BP slightly elevated at office visit.  She did not take Maxzide-25 37.5/25 mg today.  She estimates to miss a dose once a week.  She denies acute cardiac symptoms.  BP Readings from Last 3 Encounters:  01/31/21 (!) 150/80  01/10/21 139/81  12/27/20 (!) 149/84   3. At risk for constipation Yetta is at increased risk for constipation due to taking a GLP-1 for obesity.  Assessment/Plan:   1. Pre-diabetes Paizleigh will continue to work on weight loss, exercise, and decreasing simple carbohydrates  to help decrease the risk of diabetes.     - Refill metFORMIN (GLUCOPHAGE) 500 MG tablet; Take 1 tablet (500 mg total) by mouth 2 (two) times daily with a meal.  Dispense: 60 tablet; Refill: 0  2. Essential hypertension Zarai is working on healthy weight loss and exercise to improve blood pressure control. We will watch for signs of hypotension as she continues her lifestyle modifications. Take Maxzide-25 37.5/25 mg each morning.  3. At risk for constipation [place] was given approximately 15 minutes of counseling today regarding prevention of constipation. She was encouraged to increase water and fiber intake.    4. Class 2 severe obesity with serious comorbidity and body mass index (BMI) of 38.0 to 38.9 in adult, unspecified obesity type ([place]), current bmi 38.05  Judieth is currently in the action stage of change. As such, her goal is to continue with weight loss efforts. She has agreed to the Category 3 Plan.   Exercise goals: Walk in the park for 10 minutes per day. Use [employer] membership once per week.  Behavioral modification strategies: increasing lean protein intake, decreasing simple carbohydrates, meal planning and cooking strategies, keeping healthy foods in the home, and planning for success.  Change timing of lunch and dinner times. Meal plan/prep food to help with plan compliance.  Elenna has agreed to follow-up with our clinic in 2-3 weeks. She was informed of the importance of frequent follow-up visits to maximize her success with intensive  lifestyle modifications for her multiple health conditions.   Objective:   Blood pressure (!) 150/80, pulse 91, temperature 97.8 F (36.6 C), height '5\' 7"'$  (1.702 m), weight 242 lb (109.8 kg), SpO2 97 %. Body mass index is 37.9 kg/m.  General: Cooperative, alert, well developed, in no acute distress. HEENT: Conjunctivae and lids unremarkable. Cardiovascular: Regular rhythm.  Lungs: Normal work of breathing. Neurologic: No focal  deficits.   Lab Results  Component Value Date   CREATININE 0.76 12/05/2020   BUN 15 12/05/2020   NA 139 12/05/2020   K 4.0 12/05/2020   CL 99 12/05/2020   CO2 26 12/05/2020   Lab Results  Component Value Date   ALT 21 12/05/2020   AST 14 12/05/2020   ALKPHOS 88 12/05/2020   BILITOT 0.3 12/05/2020   Lab Results  Component Value Date   HGBA1C 6.2 (H) 12/05/2020   HGBA1C 6.2 (H) 04/04/2020   HGBA1C 6.0 (H) 09/08/2019   HGBA1C 6.2 (H) 02/04/2019   HGBA1C 5.9 (H) 08/06/2018   Lab Results  Component Value Date   INSULIN 13.5 12/05/2020   INSULIN 22.9 04/04/2020   INSULIN 12.6 09/08/2019   INSULIN 23.8 02/04/2019   INSULIN 20.6 08/06/2018   Lab Results  Component Value Date   TSH 2.200 12/05/2020   Lab Results  Component Value Date   CHOL 172 12/05/2020   HDL 51 12/05/2020   LDLCALC 83 12/05/2020   TRIG 229 (H) 12/05/2020   CHOLHDL 3.9 04/04/2020   Lab Results  Component Value Date   VD25OH 60.1 12/05/2020   VD25OH 41.2 04/04/2020   VD25OH 42.5 09/08/2019   Lab Results  Component Value Date   WBC 7.1 04/15/2019   HGB 13.4 04/15/2019   HCT 41.4 04/15/2019   MCV 83.8 04/15/2019   PLT 297 04/15/2019   Attestation Statements:   Reviewed by clinician on day of visit: allergies, medications, problem list, medical history, surgical history, family history, social history, and previous encounter notes.  I, Water quality scientist, CMA, am acting as Location manager for Mina Marble, NP.  I have reviewed the above documentation for accuracy and completeness, and I agree with the above. -  Karenna Romanoff d. Kaitlynn Tramontana, NP-C

## 2021-02-06 DIAGNOSIS — R7303 Prediabetes: Secondary | ICD-10-CM | POA: Insufficient documentation

## 2021-02-06 DIAGNOSIS — Z6838 Body mass index (BMI) 38.0-38.9, adult: Secondary | ICD-10-CM | POA: Insufficient documentation

## 2021-02-06 DIAGNOSIS — I10 Essential (primary) hypertension: Secondary | ICD-10-CM | POA: Insufficient documentation

## 2021-02-06 DIAGNOSIS — Z6839 Body mass index (BMI) 39.0-39.9, adult: Secondary | ICD-10-CM | POA: Insufficient documentation

## 2021-02-07 ENCOUNTER — Encounter: Payer: Self-pay | Admitting: Orthopaedic Surgery

## 2021-02-07 ENCOUNTER — Ambulatory Visit: Payer: 59 | Admitting: Orthopaedic Surgery

## 2021-02-07 DIAGNOSIS — M65332 Trigger finger, left middle finger: Secondary | ICD-10-CM | POA: Diagnosis not present

## 2021-02-07 DIAGNOSIS — M65322 Trigger finger, left index finger: Secondary | ICD-10-CM | POA: Diagnosis not present

## 2021-02-07 DIAGNOSIS — M65331 Trigger finger, right middle finger: Secondary | ICD-10-CM

## 2021-02-07 DIAGNOSIS — M65321 Trigger finger, right index finger: Secondary | ICD-10-CM | POA: Diagnosis not present

## 2021-02-07 NOTE — Progress Notes (Signed)
Office Visit Note   Patient: Candace [name]           Date of Birth: 26-Apr-1961           MRN: [id number] Visit Date: 02/07/2021              Requested by: [name], MD [address],  [place] [id number] PCP: [name], MD   Assessment & Plan: Visit Diagnoses:  1. Trigger index finger of left hand   2. Trigger finger, right middle finger   3. Trigger finger, left middle finger   4. Trigger index finger of right hand     Plan: Since it has been over 10 months since her last injections I do feel it is reasonable to try a steroid injection in the A1 pulley of both middle and index fingers on the right and left hands.  She agrees with this treatment plan as well.  She did tolerate them.  I would always repeat these again if needed.  All questions and concerns were answered and addressed.  Follow-Up Instructions: Return if symptoms worsen or fail to improve.   Orders:  Orders Placed This Encounter  Procedures   Hand/UE Inj   Hand/UE Inj   Hand/UE Inj   Hand/UE Inj   No orders of the defined types were placed in this encounter.     Procedures: Hand/UE Inj: R index A1 for trigger finger on 02/07/2021 3:33 PM   Hand/UE Inj: L index A1 for trigger finger on 02/07/2021 3:34 PM   Hand/UE Inj: R long A1 for trigger finger on 02/07/2021 3:34 PM   Hand/UE Inj: L long A1 for trigger finger on 02/07/2021 3:34 PM     Clinical Data: No additional findings.   Subjective: Chief Complaint  Patient presents with   Left Hand - Pain   Right Hand - Pain  Candace [name] comes in today with bilateral triggering of the index finger and long finger on both hands.  I last saw her in October of last year which is now been 10 months and provided steroid injections in all 4 A1 pulleys.  He has been recently where she has developed triggering again that occurs more so at night and in the morning.  She has had at least injections twice before.  We are still considering  continuing conservative treatment for these issues since the injections have been lasting longer.  She has had no other acute change in her medical status.  She is not a diabetic.  HPI  Review of Systems There is no fever or chills  Objective: Vital Signs: There were no vitals taken for this visit.  Physical Exam She is alert and orient x3 and in no acute distress Ortho Exam Both hands show pain over the A1 pulley of the index and long fingers bilaterally with active triggering.  The remainder of her hand exam bilaterally is normal. Specialty Comments:  No specialty comments available.  Imaging: No results found.   PMFS History: Patient Active Problem List   Diagnosis Date Noted   Pre-diabetes 02/06/2021   Essential hypertension 02/06/2021   Class 2 severe obesity with serious comorbidity and body mass index (BMI) of 38.0 to 38.9 in adult [facility]) 02/06/2021   Genetic testing 05/13/2016   Malignant neoplasm of upper-outer quadrant of right breast in female, estrogen receptor positive ([place]) 05/02/2016   Family history of breast cancer in sister 04/23/2016   Depression    Endometrial hyperplasia  Oligomenorrhea    Insomnia    H/O hemorrhoids    Epidermal cyst    High risk HPV infection    Carpal tunnel syndrome on left 05/28/2011   Past Medical History:  Diagnosis Date   Abnormal Pap smear    Alcohol abuse    Anovulation    Anxiety    Breast cancer ([place]) 04/05/2016   right breast   Cancer ([place]) 2017   right breast cancer   Complication of anesthesia    slow to wake up   Depression    Endometrial hyperplasia    Endometrial polyp    h/o   Epidermal cyst    h/o   Gallbladder problem    GERD (gastroesophageal reflux disease)    H/O hemorrhoids    H/O menorrhagia    High risk HPV infection    Hyperlipidemia    Insomnia    Obesity    Oligomenorrhea    Prediabetes    Restless legs syndrome    Seasonal allergies    Vitamin D deficiency     Family History   Problem Relation Age of Onset   Heart disease Mother    Hypertension Mother    Stroke Mother    Dementia Mother        vascular dementia   Hyperlipidemia Mother    Depression Mother    Anxiety disorder Mother    Alcohol abuse Mother    Obesity Mother    Pulmonary fibrosis Father        d. 89   Depression Father    Anxiety disorder Father    Alcohol abuse Father    Heart attack Maternal Grandmother        d. 12-65y   Heart disease Maternal Grandmother    Heart disease Maternal Grandfather        d. 73-63   Leukemia Paternal Grandmother        d. late 50s   Breast cancer Sister 57       IDC s/p mastectomy and chemo    Past Surgical History:  Procedure Laterality Date   BREAST LUMPECTOMY Right 05/2016   radiation   BREAST LUMPECTOMY WITH RADIOACTIVE SEED AND SENTINEL LYMPH NODE BIOPSY Right 05/28/2016   Procedure: RADIOACTIVE SEED GUIDED RIGHT BREAST LUMPECTOMY WITH  RIGHT AXILLARY SENTINEL LYMPH NODE BIOPSY;  Surgeon: [name], MD;  Location: [facility];  Service: General;  Laterality: Right;   CARPAL TUNNEL RELEASE  03/26/2011   right hand   CARPAL TUNNEL RELEASE  05/28/2011   Procedure: CARPAL TUNNEL RELEASE;  Surgeon: [name];  Location: [facility];  Service: Orthopedics;  Laterality: Left;   CHOLECYSTECTOMY N/A 01/24/2013   Procedure: LAPAROSCOPIC CHOLECYSTECTOMY WITH INTRAOPERATIVE CHOLANGIOGRAM;  Surgeon: [name], MD;  Location: [facility];  Service: General;  Laterality: N/A;   COLONOSCOPY     HYSTEROSCOPY     [place]   as child   trigger fingers  8 yrs. ago   both done months apart   uterine ablation  06/04/2010   uterine ablation     Social History   Occupational History   Not on file  Tobacco Use   Smoking status: Never   Smokeless tobacco: Never  Substance and Sexual Activity   Alcohol use: No    Comment: none since 1996   Drug use: No   Sexual activity: Not Currently    Birth control/protection: None

## 2021-02-12 ENCOUNTER — Encounter (INDEPENDENT_AMBULATORY_CARE_PROVIDER_SITE_OTHER): Payer: Self-pay

## 2021-02-15 ENCOUNTER — Other Ambulatory Visit (HOSPITAL_COMMUNITY): Payer: Self-pay

## 2021-02-15 MED FILL — Anastrozole Tab 1 MG: ORAL | 90 days supply | Qty: 90 | Fill #1 | Status: AC

## 2021-02-15 MED FILL — Ropinirole Hydrochloride Tab 1 MG: ORAL | 90 days supply | Qty: 90 | Fill #1 | Status: AC

## 2021-02-16 ENCOUNTER — Other Ambulatory Visit (HOSPITAL_COMMUNITY): Payer: Self-pay

## 2021-02-19 ENCOUNTER — Other Ambulatory Visit (HOSPITAL_COMMUNITY): Payer: Self-pay

## 2021-02-20 ENCOUNTER — Other Ambulatory Visit (HOSPITAL_COMMUNITY): Payer: Self-pay

## 2021-02-20 ENCOUNTER — Ambulatory Visit (INDEPENDENT_AMBULATORY_CARE_PROVIDER_SITE_OTHER): Payer: 59 | Admitting: Physician Assistant

## 2021-02-21 ENCOUNTER — Other Ambulatory Visit (HOSPITAL_COMMUNITY): Payer: Self-pay

## 2021-02-21 MED ORDER — BUPROPION HCL ER (XL) 300 MG PO TB24
300.0000 mg | ORAL_TABLET | Freq: Every morning | ORAL | 0 refills | Status: DC
  Filled 2021-02-21 – 2021-03-02 (×2): qty 90, 90d supply, fill #0

## 2021-03-01 ENCOUNTER — Ambulatory Visit (INDEPENDENT_AMBULATORY_CARE_PROVIDER_SITE_OTHER): Payer: 59 | Admitting: Family Medicine

## 2021-03-01 ENCOUNTER — Other Ambulatory Visit: Payer: Self-pay

## 2021-03-01 ENCOUNTER — Other Ambulatory Visit (HOSPITAL_COMMUNITY): Payer: Self-pay

## 2021-03-01 ENCOUNTER — Encounter (INDEPENDENT_AMBULATORY_CARE_PROVIDER_SITE_OTHER): Payer: Self-pay | Admitting: Family Medicine

## 2021-03-01 VITALS — BP 124/78 | HR 77 | Temp 98.4°F | Ht 67.0 in | Wt 240.0 lb

## 2021-03-01 DIAGNOSIS — R7303 Prediabetes: Secondary | ICD-10-CM | POA: Diagnosis not present

## 2021-03-01 DIAGNOSIS — Z6839 Body mass index (BMI) 39.0-39.9, adult: Secondary | ICD-10-CM

## 2021-03-01 DIAGNOSIS — R0789 Other chest pain: Secondary | ICD-10-CM

## 2021-03-01 MED ORDER — METFORMIN HCL 500 MG PO TABS
500.0000 mg | ORAL_TABLET | Freq: Two times a day (BID) | ORAL | 0 refills | Status: DC
  Filled 2021-03-01: qty 60, 30d supply, fill #0

## 2021-03-02 ENCOUNTER — Other Ambulatory Visit (HOSPITAL_COMMUNITY): Payer: Self-pay

## 2021-03-02 MED FILL — Esomeprazole Magnesium Cap Delayed Release 40 MG (Base Eq): ORAL | 90 days supply | Qty: 90 | Fill #1 | Status: AC

## 2021-03-06 NOTE — Progress Notes (Signed)
Chief Complaint:   OBESITY Candace [name] is here to discuss her progress with her obesity treatment plan along with follow-up of her obesity related diagnoses. Candace [name] is on the Category 3 Plan and states she is following her eating plan approximately 60% of the time. Candace [name] states she is doing 0 minutes 0 times per week.  Today's visit was #: 66 Starting weight: 249 lbs Starting date: 09/17/2017 Today's weight: 240 lbs Today's date: 03/01/2021 Total lbs lost to date: 9 Total lbs lost since last in-office visit: 2  Interim History: Candace [name] continues to do well with weight loss. She is working on increasing her protein in her diet and she continues  to be mindful of her diet. She is knitting more with brings her joy and helps decrease comfort eating. She is planning on increasing her activity as there weather continues to cool.  Subjective:   1. Atypical chest pain Candace [name] has had a couple of episodes of chest pain in the last 2-3 weeks. Both were after eating and 1 times was right lateral chest and second was substernal, both at rest and both resolved in approximately 30 minutes. She denies pain today.   2. Pre-diabetes Candace [name] I stable on metformin, and she denies nausea, vomiting, or hypoglycemia.  Assessment/Plan:   1. Atypical chest pain EKG was dones today and compared to EKG done in 2019. It is likely that Candace [name]'s chest pain is not cardiac, but her EKG does show a stable unchanged RBBB. We have sent a non-urgent refer to Cardiology for evaluation and treatment recommendations.  - EKG 12-Lead - Ambulatory referral to Cardiology  2. Pre-diabetes Candace [name] will continue to work on weight loss, exercise, and decreasing simple carbohydrates to help decrease the risk of diabetes. We will refill metformin for 1 month.  - metFORMIN (GLUCOPHAGE) 500 MG tablet; Take 1 tablet (500 mg total) by mouth 2 (two) times daily with a meal.  Dispense: 60 tablet; Refill: 0  3. Obesity with current BMI  37.6 Candace [name] is currently in the action stage of change. As such, her goal is to continue with weight loss efforts. She has agreed to the Category 3 Plan.   Behavioral modification strategies: increasing lean protein intake and meal planning and cooking strategies.  Candace [name] has agreed to follow-up with our clinic in 2 to 3 weeks. She was informed of the importance of frequent follow-up visits to maximize her success with intensive lifestyle modifications for her multiple health conditions.   Objective:   Blood pressure 124/78, pulse 77, temperature 98.4 F (36.9 C), height '5\' 7"'$  (1.702 m), weight 240 lb (108.9 kg), SpO2 98 %. Body mass index is 37.59 kg/m.  General: Cooperative, alert, well developed, in no acute distress. HEENT: Conjunctivae and lids unremarkable. Cardiovascular: Regular rhythm.  Lungs: Normal work of breathing. Neurologic: No focal deficits.   Lab Results  Component Value Date   CREATININE 0.76 12/05/2020   BUN 15 12/05/2020   NA 139 12/05/2020   K 4.0 12/05/2020   CL 99 12/05/2020   CO2 26 12/05/2020   Lab Results  Component Value Date   ALT 21 12/05/2020   AST 14 12/05/2020   ALKPHOS 88 12/05/2020   BILITOT 0.3 12/05/2020   Lab Results  Component Value Date   HGBA1C 6.2 (H) 12/05/2020   HGBA1C 6.2 (H) 04/04/2020   HGBA1C 6.0 (H) 09/08/2019   HGBA1C 6.2 (H) 02/04/2019   HGBA1C 5.9 (H) 08/06/2018   Lab Results  Component Value Date  INSULIN 13.5 12/05/2020   INSULIN 22.9 04/04/2020   INSULIN 12.6 09/08/2019   INSULIN 23.8 02/04/2019   INSULIN 20.6 08/06/2018   Lab Results  Component Value Date   TSH 2.200 12/05/2020   Lab Results  Component Value Date   CHOL 172 12/05/2020   HDL 51 12/05/2020   LDLCALC 83 12/05/2020   TRIG 229 (H) 12/05/2020   CHOLHDL 3.9 04/04/2020   Lab Results  Component Value Date   VD25OH 60.1 12/05/2020   VD25OH 41.2 04/04/2020   VD25OH 42.5 09/08/2019   Lab Results  Component Value Date   WBC 7.1  04/15/2019   HGB 13.4 04/15/2019   HCT 41.4 04/15/2019   MCV 83.8 04/15/2019   PLT 297 04/15/2019   No results found for: IRON, TIBC, FERRITIN  Attestation Statements:   Reviewed by clinician on day of visit: allergies, medications, problem list, medical history, surgical history, family history, social history, and previous encounter notes.  Time spent on visit including pre-visit chart review and post-visit care and charting was 55 minutes.    I, Trixie Dredge, am acting as transcriptionist for Dennard Nip, MD.  I have reviewed the above documentation for accuracy and completeness, and I agree with the above. -  Dennard Nip, MD

## 2021-03-09 ENCOUNTER — Other Ambulatory Visit (HOSPITAL_COMMUNITY): Payer: Self-pay

## 2021-03-09 DIAGNOSIS — F40243 Fear of flying: Secondary | ICD-10-CM | POA: Diagnosis not present

## 2021-03-09 DIAGNOSIS — I10 Essential (primary) hypertension: Secondary | ICD-10-CM | POA: Diagnosis not present

## 2021-03-09 DIAGNOSIS — E559 Vitamin D deficiency, unspecified: Secondary | ICD-10-CM | POA: Diagnosis not present

## 2021-03-09 DIAGNOSIS — Z0000 Encounter for general adult medical examination without abnormal findings: Secondary | ICD-10-CM | POA: Diagnosis not present

## 2021-03-09 DIAGNOSIS — K219 Gastro-esophageal reflux disease without esophagitis: Secondary | ICD-10-CM | POA: Diagnosis not present

## 2021-03-09 DIAGNOSIS — C50411 Malignant neoplasm of upper-outer quadrant of right female breast: Secondary | ICD-10-CM | POA: Diagnosis not present

## 2021-03-09 DIAGNOSIS — F3341 Major depressive disorder, recurrent, in partial remission: Secondary | ICD-10-CM | POA: Diagnosis not present

## 2021-03-09 DIAGNOSIS — E785 Hyperlipidemia, unspecified: Secondary | ICD-10-CM | POA: Diagnosis not present

## 2021-03-09 DIAGNOSIS — G2581 Restless legs syndrome: Secondary | ICD-10-CM | POA: Diagnosis not present

## 2021-03-09 MED ORDER — TRIAMTERENE-HCTZ 37.5-25 MG PO TABS
1.0000 | ORAL_TABLET | Freq: Every day | ORAL | 1 refills | Status: DC
  Filled 2021-03-09: qty 90, 90d supply, fill #0
  Filled 2021-06-29: qty 90, 90d supply, fill #1

## 2021-03-09 MED ORDER — FLUOXETINE HCL 10 MG PO CAPS
10.0000 mg | ORAL_CAPSULE | Freq: Every day | ORAL | 1 refills | Status: DC
  Filled 2021-05-23: qty 90, 90d supply, fill #0
  Filled 2021-08-30: qty 90, 90d supply, fill #1

## 2021-03-09 MED ORDER — ALPRAZOLAM 0.25 MG PO TABS
0.2500 mg | ORAL_TABLET | ORAL | 0 refills | Status: DC | PRN
  Filled 2021-03-09: qty 5, 5d supply, fill #0

## 2021-03-09 MED ORDER — BUPROPION HCL ER (XL) 300 MG PO TB24
300.0000 mg | ORAL_TABLET | ORAL | 1 refills | Status: DC
  Filled 2021-06-08: qty 90, 90d supply, fill #0
  Filled 2021-09-14 – 2021-12-19 (×2): qty 90, 90d supply, fill #1

## 2021-03-09 MED ORDER — ESOMEPRAZOLE MAGNESIUM 40 MG PO CPDR
40.0000 mg | DELAYED_RELEASE_CAPSULE | Freq: Every day | ORAL | 3 refills | Status: DC
  Filled 2021-06-08: qty 90, 90d supply, fill #0
  Filled 2021-09-14 – 2021-12-19 (×2): qty 90, 90d supply, fill #1

## 2021-03-09 MED ORDER — ROSUVASTATIN CALCIUM 10 MG PO TABS
10.0000 mg | ORAL_TABLET | Freq: Every evening | ORAL | 3 refills | Status: DC
  Filled 2021-03-09: qty 90, 90d supply, fill #0
  Filled 2021-06-15: qty 90, 90d supply, fill #1
  Filled 2021-09-14: qty 90, 90d supply, fill #2

## 2021-03-09 MED ORDER — ROPINIROLE HCL 1 MG PO TABS
1.0000 mg | ORAL_TABLET | Freq: Every day | ORAL | 3 refills | Status: DC
  Filled 2021-05-03: qty 90, 90d supply, fill #0
  Filled 2021-08-07: qty 90, 90d supply, fill #1
  Filled 2021-10-30: qty 90, 90d supply, fill #2
  Filled 2022-02-01: qty 90, 90d supply, fill #3

## 2021-03-14 ENCOUNTER — Other Ambulatory Visit (HOSPITAL_COMMUNITY): Payer: Self-pay

## 2021-03-22 ENCOUNTER — Ambulatory Visit (INDEPENDENT_AMBULATORY_CARE_PROVIDER_SITE_OTHER): Payer: 59 | Admitting: Family Medicine

## 2021-03-29 ENCOUNTER — Other Ambulatory Visit (HOSPITAL_COMMUNITY): Payer: Self-pay

## 2021-03-29 MED ORDER — CARESTART COVID-19 HOME TEST VI KIT
PACK | 0 refills | Status: DC
  Filled 2021-03-29: qty 4, 4d supply, fill #0

## 2021-04-03 ENCOUNTER — Other Ambulatory Visit (HOSPITAL_COMMUNITY): Payer: Self-pay

## 2021-04-03 ENCOUNTER — Encounter (INDEPENDENT_AMBULATORY_CARE_PROVIDER_SITE_OTHER): Payer: Self-pay | Admitting: Family Medicine

## 2021-04-03 ENCOUNTER — Other Ambulatory Visit: Payer: Self-pay

## 2021-04-03 ENCOUNTER — Ambulatory Visit (INDEPENDENT_AMBULATORY_CARE_PROVIDER_SITE_OTHER): Payer: 59 | Admitting: Family Medicine

## 2021-04-03 VITALS — BP 138/72 | HR 98 | Temp 98.2°F | Ht 67.0 in | Wt 241.0 lb

## 2021-04-03 DIAGNOSIS — Z9189 Other specified personal risk factors, not elsewhere classified: Secondary | ICD-10-CM | POA: Diagnosis not present

## 2021-04-03 DIAGNOSIS — Z6839 Body mass index (BMI) 39.0-39.9, adult: Secondary | ICD-10-CM

## 2021-04-03 DIAGNOSIS — R7303 Prediabetes: Secondary | ICD-10-CM

## 2021-04-03 DIAGNOSIS — F419 Anxiety disorder, unspecified: Secondary | ICD-10-CM | POA: Diagnosis not present

## 2021-04-03 MED ORDER — METFORMIN HCL 500 MG PO TABS
500.0000 mg | ORAL_TABLET | Freq: Two times a day (BID) | ORAL | 0 refills | Status: DC
  Filled 2021-04-03: qty 60, 30d supply, fill #0

## 2021-04-03 NOTE — Progress Notes (Signed)
Chief Complaint:   OBESITY Candace [name] is here to discuss her progress with her obesity treatment plan along with follow-up of her obesity related diagnoses. Candace [name] is on the Category 3 Plan and states she is following her eating plan approximately 25% of the time. Candace [name] states she is walking for 20 minutes 4 times per week.  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 She has been dealing with a lot of stressors and she hasn't been able to concentrate as much but has done a good job of avoiding the worst stress eating.   Subjective:   1. Pre-diabetes Candace [name] is stable and she denies nausea or vomiting, and she is tolerating metformin.  2. Anxiety Candace [name] has been dealing with stressors all at the same time. She tried to minimize emotional eating behaviors, but she struggled with meal planning.  3. At risk for impaired metabolic function Candace [name] is at increased risk for impaired metabolic function if protein is too low.   Assessment/Plan:   1. Pre-diabetes Candace [name] will continue to work on weight loss, exercise, and decreasing simple carbohydrates to help decrease the risk of diabetes. We will refill metformin for 1 month.  - metFORMIN (GLUCOPHAGE) 500 MG tablet; Take 1 tablet (500 mg total) by mouth 2 (two) times daily with a meal.  Dispense: 60 tablet; Refill: 0  2. Anxiety Candace [name] will continue her medications and emotional eating behavior strategies were discussed today to help Candace [name] deal with her anxiety and emotional/non-hunger eating behaviors. Orders and follow up as documented in patient record.   3. At risk for impaired metabolic function Candace [name] was given approximately 15 minutes of impaired  metabolic function prevention counseling today. We  discussed intensive lifestyle modifications today with an emphasis on specific nutrition and exercise instructions and strategies.   Repetitive spaced learning was employed today to elicit superior memory formation and behavioral change.  4. Obesity with current BMI 37.9 Candace [name] is currently in the action stage of change. As such, her goal is to continue with weight loss efforts. She has agreed to the Category 3 Plan.   Exercise goals: As is.  Behavioral modification strategies: travel eating strategies and celebration eating strategies.  Candace [name] has agreed to follow-up with our clinic in 4 weeks. She was informed of the importance of frequent follow-up visits to maximize her success with intensive lifestyle modifications for her multiple health conditions.   Objective:   Blood pressure 138/72, pulse 98, temperature 98.2 F (36.8 C), height 5\' 7"  (1.702 m), weight 241 lb (109.3 kg), SpO2 98 %. Body mass index is 37.75 kg/m.  General: Cooperative, alert, well developed, in no acute distress. HEENT: Conjunctivae and lids unremarkable. Cardiovascular: Regular rhythm.  Lungs: Normal work of breathing. Neurologic: No focal deficits.   Lab Results  Component Value Date   CREATININE 0.76 12/05/2020   BUN 15 12/05/2020   NA 139 12/05/2020   K 4.0 12/05/2020   CL 99 12/05/2020   CO2 26 12/05/2020   Lab Results  Component Value Date   ALT 21 12/05/2020   AST 14 12/05/2020   ALKPHOS 88 12/05/2020   BILITOT 0.3 12/05/2020   Lab Results  Component Value Date   HGBA1C 6.2 (H) 12/05/2020   HGBA1C 6.2 (H) 04/04/2020   HGBA1C  6.0 (H) 09/08/2019   HGBA1C 6.2 (H) 02/04/2019   HGBA1C 5.9 (H) 08/06/2018   Lab Results  Component Value Date   INSULIN 13.5 12/05/2020   INSULIN 22.9 04/04/2020   INSULIN 12.6 09/08/2019   INSULIN 23.8 02/04/2019   INSULIN 20.6 08/06/2018   Lab Results  Component Value Date   TSH 2.200 12/05/2020   Lab Results  Component Value Date   CHOL 172  12/05/2020   HDL 51 12/05/2020   LDLCALC 83 12/05/2020   TRIG 229 (H) 12/05/2020   CHOLHDL 3.9 04/04/2020   Lab Results  Component Value Date   VD25OH 60.1 12/05/2020   VD25OH 41.2 04/04/2020   VD25OH 42.5 09/08/2019   Lab Results  Component Value Date   WBC 7.1 04/15/2019   HGB 13.4 04/15/2019   HCT 41.4 04/15/2019   MCV 83.8 04/15/2019   PLT 297 04/15/2019   No results found for: IRON, TIBC, FERRITIN  Attestation Statements:   Reviewed by clinician on day of visit: allergies, medications, problem list, medical history, surgical history, family history, social history, and previous encounter notes.   I, Trixie Dredge, am acting as transcriptionist for Dennard Nip, MD.  I have reviewed the above documentation for accuracy and completeness, and I agree with the above. -  Dennard Nip, MD

## 2021-04-07 DIAGNOSIS — Z20822 Contact with and (suspected) exposure to covid-19: Secondary | ICD-10-CM | POA: Diagnosis not present

## 2021-04-07 DIAGNOSIS — I10 Essential (primary) hypertension: Secondary | ICD-10-CM | POA: Diagnosis not present

## 2021-04-07 DIAGNOSIS — Z03818 Encounter for observation for suspected exposure to other biological agents ruled out: Secondary | ICD-10-CM | POA: Diagnosis not present

## 2021-05-01 ENCOUNTER — Encounter (INDEPENDENT_AMBULATORY_CARE_PROVIDER_SITE_OTHER): Payer: Self-pay | Admitting: Family Medicine

## 2021-05-01 ENCOUNTER — Ambulatory Visit: Payer: 59 | Admitting: Internal Medicine

## 2021-05-01 ENCOUNTER — Ambulatory Visit (INDEPENDENT_AMBULATORY_CARE_PROVIDER_SITE_OTHER): Payer: 59 | Admitting: Family Medicine

## 2021-05-01 ENCOUNTER — Encounter: Payer: Self-pay | Admitting: Internal Medicine

## 2021-05-01 ENCOUNTER — Other Ambulatory Visit (HOSPITAL_COMMUNITY): Payer: Self-pay

## 2021-05-01 ENCOUNTER — Other Ambulatory Visit: Payer: Self-pay

## 2021-05-01 VITALS — BP 145/81 | HR 84 | Temp 98.4°F | Ht 67.0 in | Wt 238.0 lb

## 2021-05-01 VITALS — BP 136/72 | HR 97 | Ht 67.0 in | Wt 241.8 lb

## 2021-05-01 DIAGNOSIS — R079 Chest pain, unspecified: Secondary | ICD-10-CM

## 2021-05-01 DIAGNOSIS — I4510 Unspecified right bundle-branch block: Secondary | ICD-10-CM | POA: Diagnosis not present

## 2021-05-01 DIAGNOSIS — R0683 Snoring: Secondary | ICD-10-CM | POA: Diagnosis not present

## 2021-05-01 DIAGNOSIS — Z6838 Body mass index (BMI) 38.0-38.9, adult: Secondary | ICD-10-CM | POA: Diagnosis not present

## 2021-05-01 DIAGNOSIS — I10 Essential (primary) hypertension: Secondary | ICD-10-CM | POA: Diagnosis not present

## 2021-05-01 DIAGNOSIS — Z9189 Other specified personal risk factors, not elsewhere classified: Secondary | ICD-10-CM

## 2021-05-01 DIAGNOSIS — R7303 Prediabetes: Secondary | ICD-10-CM

## 2021-05-01 MED ORDER — METOPROLOL TARTRATE 100 MG PO TABS
100.0000 mg | ORAL_TABLET | Freq: Once | ORAL | 0 refills | Status: DC
  Filled 2021-05-01: qty 1, 1d supply, fill #0

## 2021-05-01 MED ORDER — METFORMIN HCL 500 MG PO TABS
500.0000 mg | ORAL_TABLET | Freq: Two times a day (BID) | ORAL | 0 refills | Status: DC
  Filled 2021-05-01: qty 60, 30d supply, fill #0

## 2021-05-01 NOTE — Progress Notes (Signed)
Cardiology Office Note:    Date:  05/01/2021   ID:  MLISSA TAMAYO, DOB [birth year], MRN [id number]  PCP:  [name], MD  Cardiologist:  None  Electrophysiologist:  None   Referring MD: Starlyn Skeans, MD   Chief Complaint/Reason for Referral: Possible cardiac chest pain  History of Present Illness:    Candace [name] is a 60 y.o. female with a history of pre-diabetes, obesity, right breast cancer, depression, GERD, HLD, carpal tunnel, who presents for evaluation of chest pain, possibly cardiac.   Notes episodes of chest pain in August which she mentioned to Dr. Leafy Ro (referring physician). Noted episodes after eating, once was right chest pain, one was substernal, both at rest, resolved in 30 mins.   She occasionally feels sharp pain on the R side. However, she reports this pain is located primarily in her breast rather than in her chest.   In August, she felt a prolonged sharp pain on the L-chest for 2-3 mins. One week later, the L-chest pain occurred again and hurt even more which she describes as "noticeably different". The pain had associated chest pressure but no L arm numbness. The pain worried her and she contacted a friend to accompany her for a walk while she was experiencing this pain. They went on a walk and movement did not worsen or improve the pain. The pain dissipated after sitting down. The whole episode lasted about 30 mins. She contacted [facility] and performed an EKG showing a RBBB. These episodes occur about once a week. Her last L-chest pain episode was a few days ago but was not as severe.   She experiences an non-productive cough which occurred after the L-chest pain. She believes this cough feels like an issue with her lungs. When coughing, she reports a sharp pain down in her chest that worsens with exhaling.   She has never smoked and does not consume alcohol or recreational drugs. She takes multivitamins daily and tries to eat well. However,  she does not have an active lifestyle. She reports not having energy to exercise continuously and experiences dyspnea on exertion such as walking up stairs. She endorses snoring and restless leg syndrome. She performed a sleep test last year but was not able to sleep during the testing. She reports not being able to sleep properly ever since pregnancy.  Both her maternal grandparents had heart disease and passed away in their 60s. Her father had pulmonary fibrosis. Her mother had a valve replacement, possibly aortic, in her 59s 2/2 to rheumatic fever as a child. Her mother also had vascular dementia and lived until 60 y/o.   The patient denies dyspnea at rest, palpitations, PND, orthopnea, or leg swelling. Denies fever, chills. Denies nausea, vomiting. Denies syncope or presyncope. Denies dizziness or lightheadedness.   Past Medical History:  Diagnosis Date   Abnormal Pap smear    Alcohol abuse    Anovulation    Anxiety    Breast cancer ([place]) 04/05/2016   right breast   Cancer ([place]) 2017   right breast cancer   Complication of anesthesia    slow to wake up   Depression    Endometrial hyperplasia    Endometrial polyp    h/o   Epidermal cyst    h/o   Gallbladder problem    GERD (gastroesophageal reflux disease)    H/O hemorrhoids    H/O menorrhagia    High risk HPV infection    Hyperlipidemia  Insomnia    Obesity    Oligomenorrhea    Prediabetes    Restless legs syndrome    Seasonal allergies    Vitamin D deficiency     Past Surgical History:  Procedure Laterality Date   BREAST LUMPECTOMY Right 05/2016   radiation   BREAST LUMPECTOMY WITH RADIOACTIVE SEED AND SENTINEL LYMPH NODE BIOPSY Right 05/28/2016   Procedure: RADIOACTIVE SEED GUIDED RIGHT BREAST LUMPECTOMY WITH  RIGHT AXILLARY SENTINEL LYMPH NODE BIOPSY;  Surgeon: [name], MD;  Location: [facility];  Service: General;  Laterality: Right;   CARPAL TUNNEL RELEASE  03/26/2011   right hand   CARPAL TUNNEL  RELEASE  05/28/2011   Procedure: CARPAL TUNNEL RELEASE;  Surgeon: [name];  Location: [facility];  Service: Orthopedics;  Laterality: Left;   CHOLECYSTECTOMY N/A 01/24/2013   Procedure: LAPAROSCOPIC CHOLECYSTECTOMY WITH INTRAOPERATIVE CHOLANGIOGRAM;  Surgeon: [name], MD;  Location: [facility];  Service: General;  Laterality: N/A;   COLONOSCOPY     HYSTEROSCOPY     [place]   as child   trigger fingers  8 yrs. ago   both done months apart   uterine ablation  06/04/2010   uterine ablation      Current Medications: Current Meds  Medication Sig   ALPRAZolam (XANAX) 0.25 MG tablet Take 1 tablet (0.25 mg total) by mouth as needed 30 minutes before flying   anastrozole (ARIMIDEX) 1 MG tablet TAKE 1 TABLET (1 MG TOTAL) BY MOUTH DAILY.   buPROPion (WELLBUTRIN XL) 300 MG 24 hr tablet Take 1 tablet (300 mg total) by mouth every morning.   cetirizine (ZYRTEC) 10 MG tablet Take 10 mg by mouth daily. [facility]   Cholecalciferol (VITAMIN D-3) 125 MCG (5000 UT) TABS Take 5,000 Units by mouth daily. (Patient taking differently: Take 50 mg by mouth daily.)   esomeprazole (NEXIUM) 40 MG capsule Take 1 capsule (40 mg total) by mouth daily.   FLUoxetine (PROZAC) 10 MG capsule Take 1 capsule (10 mg total) by mouth daily.   Insulin Pen Needle (UNIFINE PENTIPS) 32G X 4 MM MISC Use as directed once daily   Liraglutide -Weight Management 18 MG/3ML SOPN Inject 3 mg into the skin daily.   metFORMIN (GLUCOPHAGE) 500 MG tablet Take 1 tablet (500 mg total) by mouth 2 (two) times daily with a meal.   Multiple Vitamin (MULTIVITAMIN WITH MINERALS) TABS tablet Take 1 tablet by mouth daily.   rOPINIRole (REQUIP) 1 MG tablet Take 1 tablet (1 mg total) by mouth 1-3 hours prior to bedtime.   rosuvastatin (CRESTOR) 10 MG tablet Take 1 tablet (10 mg total) by mouth every evening.   triamterene-hydrochlorothiazide (MAXZIDE-25) 37.5-25 MG tablet Take 1 tablet by  mouth daily.   [DISCONTINUED] COVID-19 At Home Antigen Test (CARESTART COVID-19 HOME TEST) KIT Use as directed   [DISCONTINUED] esomeprazole (NEXIUM) 40 MG capsule TAKE 1 CAPSULE BY MOUTH ONCE A DAY   [DISCONTINUED] rOPINIRole (REQUIP) 1 MG tablet TAKE 1 TABLET BY MOUTH 1 TO 3 HOURS BEFORE BEDTIME   [DISCONTINUED] triamterene-hydrochlorothiazide (MAXZIDE-25) 37.5-25 MG tablet TAKE 1 TABLET BY MOUTH DAILY IN THE MORNING     Allergies:   Patient has no known allergies.   Social History   Tobacco Use   Smoking status: Never   Smokeless tobacco: Never  Substance Use Topics   Alcohol use: No    Comment: none since 1996   Drug use: No     Family History:  The patient's family history includes Alcohol abuse in her father and mother; Anxiety disorder in her father and mother; Breast cancer (age of onset: 41) in her sister; Dementia in her mother; Depression in her father and mother; Heart attack in her maternal grandmother; Heart disease in her maternal grandfather, maternal grandmother, and mother; Hyperlipidemia in her mother; Hypertension in her mother; Leukemia in her paternal grandmother; Obesity in her mother; Pulmonary fibrosis in her father; Stroke in her mother.  ROS:   Please see the history of present illness.    (+) Dyspnea on exertion (+) Bilateral chest pain (+) Snoring All other systems reviewed and are negative.  EKGs/Labs/Other Studies Reviewed:    The following studies were reviewed today: No previous cardiovascular studies  EKG: 05/01/21: Sinus rhythm, rate 97 bpm, RBBB 03/01/21 (Dr. Leafy Ro): Sinus rhythm, rate 81, RBBB  Imaging studies that I have independently reviewed today:   Recent Labs: 12/05/2020: ALT 21; BUN 15; Creatinine, Ser 0.76; Potassium 4.0; Sodium 139; TSH 2.200  Recent Lipid Panel    Component Value Date/Time   CHOL 172 12/05/2020 1231   TRIG 229 (H) 12/05/2020 1231   HDL 51 12/05/2020 1231   CHOLHDL 3.9 04/04/2020 0810   LDLCALC 83 12/05/2020  1231    Physical Exam:    VS:  BP 136/72   Pulse 97   Ht '5\' 7"'  (1.702 m)   Wt 241 lb 12.8 oz (109.7 kg)   SpO2 97%   BMI 37.87 kg/m     Wt Readings from Last 5 Encounters:  05/01/21 241 lb 12.8 oz (109.7 kg)  05/01/21 238 lb (108 kg)  04/03/21 241 lb (109.3 kg)  03/01/21 240 lb (108.9 kg)  01/31/21 242 lb (109.8 kg)    Constitutional: No acute distress Eyes: sclera non-icteric, normal conjunctiva and lids ENMT: normal dentition, moist mucous membranes Cardiovascular: regular rhythm, normal rate, no murmur. S1 and S2 normal. No jugular venous distention.  Respiratory: clear to auscultation bilaterally GI : normal bowel sounds, soft and nontender. No distention.   MSK: extremities warm, well perfused. No edema.  NEURO: grossly nonfocal exam, moves all extremities. PSYCH: alert and oriented x 3, normal mood and affect.   ASSESSMENT:    1. Chest pain, unspecified type   2. Right bundle branch block   3. Snoring    PLAN:    Chest pain, unspecified type - Plan: EKG 12-Lead, ECHOCARDIOGRAM COMPLETE, CT CORONARY MORPH W/CTA COR W/SCORE W/CA W/CM &/OR WO/CM, Basic metabolic panel  Right bundle branch block - Plan: EKG 12-Lead, ECHOCARDIOGRAM COMPLETE, Basic metabolic panel  Snoring - Plan: EKG 12-Lead, ECHOCARDIOGRAM COMPLETE  We will plan to evaluate her concerns of possible cardiac chest pain with CCTA. Will also investigate any RV findings given RBBB and snoring with echo to evaluate pulmonary pressures and RV size and function. Can consider repeat sleep test if indicated after echo.   Total time of encounter: 60 minutes total time of encounter, including 40 minutes spent in face-to-face patient care on the date of this encounter. This time includes coordination of care and counseling regarding above mentioned problem list. Remainder of non-face-to-face time involved reviewing chart documents/testing relevant to the patient encounter and documentation in the medical record. I  have independently reviewed documentation from referring provider.   Cherlynn Kaiser, MD, [place]   Shared Decision Making/Informed Consent:       Medication Adjustments/Labs and Tests Ordered: Current medicines are reviewed at length with the patient today.  Concerns regarding medicines are outlined above.   Orders Placed This Encounter  Procedures   CT CORONARY MORPH W/CTA COR W/SCORE W/CA W/CM &/OR WO/CM   Basic metabolic panel   EKG 26-STMH   ECHOCARDIOGRAM COMPLETE     Meds ordered this encounter  Medications   metoprolol tartrate (LOPRESSOR) 100 MG tablet    Sig: Take 1 tablet (100 mg total) by mouth once for 1 dose. PLEASE TAKE METOPROLOL 2  HOURS PRIOR TO CTA SCAN.    Dispense:  1 tablet    Refill:  0     Patient Instructions  Medication Instructions:  PLEASE TAKE 157m OF METOPROLOL TARTRATE 2 HOURS PRIOR TO CCTA SCAN  *If you need a refill on your cardiac medications before your next appointment, please call your pharmacy*  Lab Work: BMET- TODAY  If you have labs (blood work) drawn today and your tests are completely normal, you will receive your results only by: [place](if you have MyChart) OR A paper copy in the mail If you have any lab test that is abnormal or we need to change your treatment, we will call you to review the results.  Testing/Procedures: Your physician has requested that you have cardiac CT. Cardiac computed tomography (CT) is a painless test that uses an x-ray machine to take clear, detailed pictures of your heart. For further information please visit [email or web address].tn Please follow instruction sheet as given.  Your physician has requested that you have an echocardiogram. Echocardiography is a painless test that uses sound waves to create images of your heart. It provides your doctor with information about the size and shape of your heart and how well your heart's chambers and valves are working. You may  receive an ultrasound enhancing agent through an IV if needed to better visualize your heart during the echo.This procedure takes approximately one hour. There are no restrictions for this procedure. This will take place at the [address].   Follow-Up: [facility] and your health needs are our priority.  As part of our continuing mission to provide you with exceptional heart care, we have created designated Provider Care Teams.  These Care Teams include your primary Cardiologist (physician) and Advanced Practice Providers (APPs -  Physician Assistants and Nurse Practitioners) who all work together to provide you with the care you need, when you need it.  Your next appointment:   1 month(s)  The format for your next appointment:   In Person  Provider:   You may see Dr. AMargaretann Loveless or one of the following Advanced Practice Providers on your designated Care Team:   CSande RivesPA-C HAlmyra DeforestPA-C  Other Instructions   Your cardiac CT will be scheduled at one of the below locations:   [facility] [place] [phone] [facility] minutes [facility] to test start time. You can use the FREE valet parking offered at the main entrance (encouraged to control the heart rate for the test) [facility] (first floor) to check-in and test prep.  Please follow these instructions carefully (unless otherwise directed):  On the Night Before the Test: Be sure to Drink plenty of water. Do not consume any caffeinated/decaffeinated beverages or chocolate 12 hours prior to your test. Do not take any antihistamines 12 hours prior to  your test.  On the Day of the Test: Drink plenty of water until 1 hour prior to the test. Do not eat any food 4 hours prior to the test. You may take your  regular medications prior to the test.  Take metoprolol (Lopressor) 133m two hours prior to test. HOLD Furosemide/Hydrochlorothiazide morning of the test. FEMALES- please wear underwire-free bra if available, avoid dresses & tight clothing  After the Test: Drink plenty of water. After receiving IV contrast, you may experience a mild flushed feeling. This is normal. On occasion, you may experience a mild rash up to 24 hours after the test. This is not dangerous. If this occurs, you can take Benadryl 25 mg and increase your fluid intake. If you experience trouble breathing, this can be serious. If it is severe call 911 IMMEDIATELY. If it is mild, please call our office. If you take any of these medications: Glipizide/Metformin, Avandament, Glucavance, please do not take 48 hours after completing test unless otherwise instructed.  Please allow 2-4 weeks for scheduling of routine cardiac CTs. Some insurance companies require a pre-authorization which may delay scheduling of this test.   For non-scheduling related questions, please contact the cardiac imaging nurse navigator should you have any questions/concerns: SMarchia Bond Cardiac Imaging Nurse Navigator MGordy Clement Cardiac Imaging Nurse Navigator [facility] Direct Office Dial: [phone]  For scheduling needs, including cancellations and rescheduling, please call [place] [phone]    I,Mykaella Javier,acting as a scribe for GElouise Munroe MD.,have documented all relevant documentation on the behalf of GElouise Munroe MD,as directed by  GElouise Munroe MD while in the presence of GElouise Munroe MD.  I, GElouise Munroe MD, have reviewed all documentation for this visit. The documentation on today's date of service for the exam, diagnosis, procedures, and orders are all accurate and complete.

## 2021-05-01 NOTE — Patient Instructions (Addendum)
Medication Instructions:  PLEASE TAKE 100mg  OF METOPROLOL TARTRATE 2 HOURS PRIOR TO CCTA SCAN  *If you need a refill on your cardiac medications before your next appointment, please call your pharmacy*  Lab Work: BMET- TODAY  If you have labs (blood work) drawn today and your tests are completely normal, you will receive your results only by: [place] (if you have MyChart) OR A paper copy in the mail If you have any lab test that is abnormal or we need to change your treatment, we will call you to review the results.  Testing/Procedures: Your physician has requested that you have cardiac CT. Cardiac computed tomography (CT) is a painless test that uses an x-ray machine to take clear, detailed pictures of your heart. For further information please visit HugeFiesta.tn. Please follow instruction sheet as given.  Your physician has requested that you have an echocardiogram. Echocardiography is a painless test that uses sound waves to create images of your heart. It provides your doctor with information about the size and shape of your heart and how well your heart's chambers and valves are working. You may receive an ultrasound enhancing agent through an IV if needed to better visualize your heart during the echo.This procedure takes approximately one hour. There are no restrictions for this procedure. This will take place at the [address], [address].   Follow-Up: At [facility], you and your health needs are our priority.  As part of our continuing mission to provide you with exceptional heart care, we have created designated Provider Care Teams.  These Care Teams include your primary Cardiologist (physician) and Advanced Practice Providers (APPs -  Physician Assistants and Nurse Practitioners) who all work together to provide you with the care you need, when you need it.  Your next appointment:   1 month(s)  The format for your next appointment:   In Person  Provider:    You may see Dr. Margaretann Loveless  or one of the following Advanced Practice Providers on your designated Care Team:   [name] PA-C Almyra Deforest PA-C  Other Instructions   Your cardiac CT will be scheduled at one of the below locations:   [facility] [address] [place], [place] [address] [phone]  If scheduled at [facility], please arrive at the [facility] main entrance (entrance A) of [facility] 30 minutes prior to test start time. You can use the FREE valet parking offered at the main entrance (encouraged to control the heart rate for the test) Proceed to the [facility] Radiology Department (first floor) to check-in and test prep.  Please follow these instructions carefully (unless otherwise directed):  On the Night Before the Test: Be sure to Drink plenty of water. Do not consume any caffeinated/decaffeinated beverages or chocolate 12 hours prior to your test. Do not take any antihistamines 12 hours prior to your test.  On the Day of the Test: Drink plenty of water until 1 hour prior to the test. Do not eat any food 4 hours prior to the test. You may take your regular medications prior to the test.  Take metoprolol (Lopressor) 100mg  two hours prior to test. HOLD Furosemide/Hydrochlorothiazide morning of the test. FEMALES- please wear underwire-free bra if available, avoid dresses & tight clothing  After the Test: Drink plenty of water. After receiving IV contrast, you may experience a mild flushed feeling. This is normal. On occasion, you may experience a mild rash up to 24 hours  after the test. This is not dangerous. If this occurs, you can take Benadryl 25 mg and increase your fluid intake. If you experience trouble breathing, this can be serious. If it is severe call 911 IMMEDIATELY. If it is mild, please call our office. If you take any of these medications: Glipizide/Metformin, Avandament, Glucavance, please do not take 48 hours after  completing test unless otherwise instructed.  Please allow 2-4 weeks for scheduling of routine cardiac CTs. Some insurance companies require a pre-authorization which may delay scheduling of this test.   For non-scheduling related questions, please contact the cardiac imaging nurse navigator should you have any questions/concerns: Marchia Bond, Cardiac Imaging Nurse Navigator Gordy Clement, Cardiac Imaging Nurse Navigator [facility] Direct Office Dial: [phone]   For scheduling needs, including cancellations and rescheduling, please call [place], [phone].

## 2021-05-01 NOTE — Progress Notes (Deleted)
Cardiology Office Note:    Date:  05/01/2021   ID:  Candace [name], DOB 1960/10/03, MRN [id number]  PCP:  [name], MD  Cardiologist:  None  Electrophysiologist:  None   Referring MD: Starlyn Skeans, MD   Chief Complaint/Reason for Referral: Possible cardiac chest pain  History of Present Illness:    Candace [name] is a 60 y.o. female with a history of pre-diabetes, obesity, right breast cancer, depression, GERD, HLD, carpal tunnel, who presents for evaluation of chest pain, possibly cardiac.   Notes episodes of chest pain in August which she mentioned to Dr. Leafy Ro (referring physician). Noted episodes after eating, once was right chest pain, one was substernal, both at rest, resolved in 30 mins.  ***  The patient denies dyspnea at rest or with exertion, palpitations, PND, orthopnea, or leg swelling. Denies cough, fever, chills. Denies nausea, vomiting. Denies syncope or presyncope. Denies dizziness or lightheadedness. Denies snoring ***.   Past Medical History:  Diagnosis Date   Abnormal Pap smear    Alcohol abuse    Anovulation    Anxiety    Breast cancer ([place]) 04/05/2016   right breast   Cancer ([place]) 2017   right breast cancer   Complication of anesthesia    slow to wake up   Depression    Endometrial hyperplasia    Endometrial polyp    h/o   Epidermal cyst    h/o   Gallbladder problem    GERD (gastroesophageal reflux disease)    H/O hemorrhoids    H/O menorrhagia    High risk HPV infection    Hyperlipidemia    Insomnia    Obesity    Oligomenorrhea    Prediabetes    Restless legs syndrome    Seasonal allergies    Vitamin D deficiency     Past Surgical History:  Procedure Laterality Date   BREAST LUMPECTOMY Right 05/2016   radiation   BREAST LUMPECTOMY WITH RADIOACTIVE SEED AND SENTINEL LYMPH NODE BIOPSY Right 05/28/2016   Procedure: RADIOACTIVE SEED GUIDED RIGHT BREAST LUMPECTOMY WITH  RIGHT AXILLARY SENTINEL LYMPH NODE BIOPSY;  Surgeon: [name], MD;  Location: [facility];  Service: General;  Laterality: Right;   CARPAL TUNNEL RELEASE  03/26/2011   right hand   CARPAL TUNNEL RELEASE  05/28/2011   Procedure: CARPAL TUNNEL RELEASE;  Surgeon: [name];  Location: [facility];  Service: Orthopedics;  Laterality: Left;   CHOLECYSTECTOMY N/A 01/24/2013   Procedure: LAPAROSCOPIC CHOLECYSTECTOMY WITH INTRAOPERATIVE CHOLANGIOGRAM;  Surgeon: [name], MD;  Location: [facility];  Service: General;  Laterality: N/A;   COLONOSCOPY     HYSTEROSCOPY     [place]   as child   trigger fingers  8 yrs. ago   both done months apart   uterine ablation  06/04/2010   uterine ablation      Current Medications: No outpatient medications have been marked as taking for the 05/01/21 encounter (Appointment) with Elouise Munroe, MD.     Allergies:   Patient has no known allergies.   Social History   Tobacco Use   Smoking status: Never   Smokeless tobacco: Never  Substance Use Topics   Alcohol use: No    Comment: none since 1996   Drug use: No     Family History: The patient's family history includes Alcohol abuse in her father and mother; Anxiety disorder in her father and mother; Breast cancer (age of  onset: 7) in her sister; Dementia in her mother; Depression in her father and mother; Heart attack in her maternal grandmother; Heart disease in her maternal grandfather, maternal grandmother, and mother; Hyperlipidemia in her mother; Hypertension in her mother; Leukemia in her paternal grandmother; Obesity in her mother; Pulmonary fibrosis in her father; Stroke in her mother.  ROS:   Please see the history of present illness.    All other systems reviewed and are negative.  EKGs/Labs/Other Studies Reviewed:    The following studies were reviewed today:  EKG:  ***  Imaging studies that I have independently reviewed today: ***  Recent Labs: 12/05/2020: ALT 21; BUN 15; Creatinine, Ser 0.76;  Potassium 4.0; Sodium 139; TSH 2.200  Recent Lipid Panel    Component Value Date/Time   CHOL 172 12/05/2020 1231   TRIG 229 (H) 12/05/2020 1231   HDL 51 12/05/2020 1231   CHOLHDL 3.9 04/04/2020 0810   LDLCALC 83 12/05/2020 1231    Physical Exam:    VS:  There were no vitals taken for this visit.    Wt Readings from Last 5 Encounters:  04/03/21 241 lb (109.3 kg)  03/01/21 240 lb (108.9 kg)  01/31/21 242 lb (109.8 kg)  01/10/21 243 lb (110.2 kg)  12/27/20 239 lb (108.4 kg)    Constitutional: No acute distress Eyes: sclera non-icteric, normal conjunctiva and lids ENMT: normal dentition, moist mucous membranes Cardiovascular: regular rhythm, normal rate, no murmur. S1 and S2 normal. No jugular venous distention.  Respiratory: clear to auscultation bilaterally GI : normal bowel sounds, soft and nontender. No distention.   MSK: extremities warm, well perfused. No edema.  NEURO: grossly nonfocal exam, moves all extremities. PSYCH: alert and oriented x 3, normal mood and affect.   ASSESSMENT:    No diagnosis found. PLAN:    No diagnosis found.  Total time of encounter: *** minutes total time of encounter, including *** minutes spent in face-to-face patient care on the date of this encounter. This time includes coordination of care and counseling regarding above mentioned problem list. Remainder of non-face-to-face time involved reviewing chart documents/testing relevant to the patient encounter and documentation in the medical record. I have independently reviewed documentation from referring provider.   Cherlynn Kaiser, MD, [place]   Shared Decision Making/Informed Consent:   {Are you ordering a CV Procedure (e.g. stress test, cath, DCCV, TEE, etc)?   Press F2        :[id number]}   Medication Adjustments/Labs and Tests Ordered: Current medicines are reviewed at length with the patient today.  Concerns regarding medicines are outlined above.   No  orders of the defined types were placed in this encounter.   No orders of the defined types were placed in this encounter.   There are no Patient Instructions on file for this visit.

## 2021-05-01 NOTE — Progress Notes (Signed)
Chief Complaint:   OBESITY Candace [name] is here to discuss her progress with her obesity treatment plan along with follow-up of her obesity related diagnoses. Candace [name] is on the Category 3 Plan and states she is following her eating plan approximately 50% of the time. Candace [name] states she was walking while on a cruise.   Today's visit was #: 68 Starting weight: 249 lbs Starting date: 09/17/2017 Today's weight: 238 lbs Today's date: 05/01/2021 Total lbs lost to date: 11 Total lbs lost since last in-office visit: 3  Interim History: Candace [name] continues to do well with weight loss even while on vacation on a cruise. She did well increasing her walking and increasing lean protein. She feels well and she continues to work on meal planning.  Subjective:   1. Pre-diabetes Candace [name] is doing well on metformin, and she denies nausea or vomiting. She is working on her diet and exercise.  2. Essential hypertension Candace [name]'s blood pressure is elevated again. She is on Maxzide but she had increased Na+ intake recently.  3. At risk for heart disease Candace [name] is at a higher than average risk for cardiovascular disease due to obesity.   Assessment/Plan:   1. Pre-diabetes Candace [name] will continue to work on weight loss, exercise, and decreasing simple carbohydrates to help decrease the risk of diabetes. We will refill metformin 500 mg BID #60 for 1 month.  2. Essential hypertension Candace [name] will continue Maxzide, diet, and exercise to improve blood pressure control. We will recheck her blood pressure in 2-3 weeks and, and will watch for signs of hypotension as she continues her lifestyle modifications.  3. At risk for heart disease Candace [name] was given approximately 15 minutes of coronary artery disease prevention counseling today. She is 60 y.o. female and has risk factors for heart disease including obesity. We discussed intensive lifestyle modifications today with an emphasis on specific weight loss instructions and  strategies.   Repetitive spaced learning was employed today to elicit superior memory formation and behavioral change.  4. Obesity with current BMI of 37.3 Candace [name] is currently in the action stage of change. As such, her goal is to continue with weight loss efforts. She has agreed to the Category 3 Plan.   Behavioral modification strategies: increasing lean protein intake and meal planning and cooking strategies.  Candace [name] has agreed to follow-up with our clinic in 2 to 3 weeks. She was informed of the importance of frequent follow-up visits to maximize her success with intensive lifestyle modifications for her multiple health conditions.   Objective:   Blood pressure (!) 145/81, pulse 84, temperature 98.4 F (36.9 C), height 5\' 7"  (1.702 m), weight 238 lb (108 kg), SpO2 96 %. Body mass index is 37.28 kg/m.  General: Cooperative, alert, well developed, in no acute distress. HEENT: Conjunctivae and lids unremarkable. Cardiovascular: Regular rhythm.  Lungs: Normal work of breathing. Neurologic: No focal deficits.   Lab Results  Component Value Date   CREATININE 0.76 12/05/2020   BUN 15 12/05/2020   NA 139 12/05/2020   K 4.0 12/05/2020   CL 99 12/05/2020   CO2 26 12/05/2020   Lab Results  Component Value Date   ALT 21 12/05/2020   AST 14 12/05/2020   ALKPHOS 88 12/05/2020   BILITOT 0.3 12/05/2020   Lab Results  Component Value Date   HGBA1C 6.2 (H) 12/05/2020   HGBA1C 6.2 (H) 04/04/2020   HGBA1C 6.0 (H) 09/08/2019   HGBA1C 6.2 (H) 02/04/2019   HGBA1C 5.9 (H) 08/06/2018  Lab Results  Component Value Date   INSULIN 13.5 12/05/2020   INSULIN 22.9 04/04/2020   INSULIN 12.6 09/08/2019   INSULIN 23.8 02/04/2019   INSULIN 20.6 08/06/2018   Lab Results  Component Value Date   TSH 2.200 12/05/2020   Lab Results  Component Value Date   CHOL 172 12/05/2020   HDL 51 12/05/2020   LDLCALC 83 12/05/2020   TRIG 229 (H) 12/05/2020   CHOLHDL 3.9 04/04/2020   Lab Results   Component Value Date   VD25OH 60.1 12/05/2020   VD25OH 41.2 04/04/2020   VD25OH 42.5 09/08/2019   Lab Results  Component Value Date   WBC 7.1 04/15/2019   HGB 13.4 04/15/2019   HCT 41.4 04/15/2019   MCV 83.8 04/15/2019   PLT 297 04/15/2019   No results found for: IRON, TIBC, FERRITIN  Attestation Statements:   Reviewed by clinician on day of visit: allergies, medications, problem list, medical history, surgical history, family history, social history, and previous encounter notes.   I, Trixie Dredge, am acting as transcriptionist for Dennard Nip, MD.  I have reviewed the above documentation for accuracy and completeness, and I agree with the above. -  Dennard Nip, MD

## 2021-05-02 LAB — BASIC METABOLIC PANEL
BUN/Creatinine Ratio: 15 (ref 12–28)
BUN: 12 mg/dL (ref 8–27)
CO2: 29 mmol/L (ref 20–29)
Calcium: 10.1 mg/dL (ref 8.7–10.3)
Chloride: 99 mmol/L (ref 96–106)
Creatinine, Ser: 0.79 mg/dL (ref 0.57–1.00)
Glucose: 110 mg/dL — ABNORMAL HIGH (ref 70–99)
Potassium: 4.2 mmol/L (ref 3.5–5.2)
Sodium: 142 mmol/L (ref 134–144)
eGFR: 86 mL/min/{1.73_m2} (ref >59)

## 2021-05-03 ENCOUNTER — Other Ambulatory Visit (HOSPITAL_COMMUNITY): Payer: Self-pay

## 2021-05-10 ENCOUNTER — Telehealth (HOSPITAL_COMMUNITY): Payer: Self-pay | Admitting: Emergency Medicine

## 2021-05-10 NOTE — Telephone Encounter (Signed)
Reaching out to patient to offer assistance regarding upcoming cardiac imaging study; pt verbalizes understanding of appt date/time, parking situation and where to check in, pre-test NPO status and medications ordered, and verified current allergies; name and call back number provided for further questions should they arise Marchia Bond RN Navigator Cardiac Imaging [facility] [phone] office [phone] cell  100mg  metoprolol tart Denies iv issues

## 2021-05-11 ENCOUNTER — Other Ambulatory Visit: Payer: Self-pay

## 2021-05-11 ENCOUNTER — Ambulatory Visit (HOSPITAL_COMMUNITY)
Admission: RE | Admit: 2021-05-11 | Discharge: 2021-05-11 | Disposition: A | Discharge status: Home / Self Care | Payer: 59 | Source: Ambulatory Visit | Attending: Internal Medicine | Admitting: Internal Medicine

## 2021-05-11 DIAGNOSIS — R079 Chest pain, unspecified: Secondary | ICD-10-CM | POA: Diagnosis not present

## 2021-05-11 DIAGNOSIS — I4510 Unspecified right bundle-branch block: Secondary | ICD-10-CM | POA: Diagnosis not present

## 2021-05-11 MED ORDER — NITROGLYCERIN 0.4 MG SL SUBL: 0.8000 mg | SUBLINGUAL_TABLET | Freq: Once | SUBLINGUAL | Status: AC

## 2021-05-11 MED ORDER — DILTIAZEM HCL 25 MG/5ML IV SOLN
INTRAVENOUS | Status: AC
  Filled 2021-05-11: qty 5

## 2021-05-11 MED ORDER — IOHEXOL 350 MG/ML SOLN
95.0000 mL | Freq: Once | INTRAVENOUS | Status: AC | PRN
  Administered 2021-05-11: 95 mL via INTRAVENOUS

## 2021-05-11 MED ORDER — METOPROLOL TARTRATE 5 MG/5ML IV SOLN: 5.0000 mg | INTRAVENOUS | Status: DC | PRN

## 2021-05-11 MED ORDER — NITROGLYCERIN 0.4 MG SL SUBL
SUBLINGUAL_TABLET | SUBLINGUAL | Status: AC
  Administered 2021-05-11: 0.8 mg via SUBLINGUAL
  Filled 2021-05-11: qty 2

## 2021-05-11 MED ORDER — METOPROLOL TARTRATE 5 MG/5ML IV SOLN
INTRAVENOUS | Status: AC
  Administered 2021-05-11: 5 mg via INTRAVENOUS
  Filled 2021-05-11: qty 10

## 2021-05-16 ENCOUNTER — Ambulatory Visit (HOSPITAL_COMMUNITY): Payer: 59 | Attending: Cardiovascular Disease

## 2021-05-16 ENCOUNTER — Other Ambulatory Visit: Payer: Self-pay

## 2021-05-16 DIAGNOSIS — I4510 Unspecified right bundle-branch block: Secondary | ICD-10-CM

## 2021-05-16 DIAGNOSIS — R0683 Snoring: Secondary | ICD-10-CM | POA: Diagnosis not present

## 2021-05-16 DIAGNOSIS — R079 Chest pain, unspecified: Secondary | ICD-10-CM

## 2021-05-16 LAB — ECHOCARDIOGRAM COMPLETE
Area-P 1/2: 6.96 cm2
S' Lateral: 2.1 cm

## 2021-05-22 ENCOUNTER — Ambulatory Visit (INDEPENDENT_AMBULATORY_CARE_PROVIDER_SITE_OTHER): Payer: 59 | Admitting: Family Medicine

## 2021-05-22 ENCOUNTER — Other Ambulatory Visit (HOSPITAL_COMMUNITY): Payer: Self-pay

## 2021-05-22 ENCOUNTER — Encounter (INDEPENDENT_AMBULATORY_CARE_PROVIDER_SITE_OTHER): Payer: Self-pay | Admitting: Family Medicine

## 2021-05-22 ENCOUNTER — Other Ambulatory Visit: Payer: Self-pay

## 2021-05-22 VITALS — BP 158/81 | Temp 97.7°F | Ht 67.0 in | Wt 243.0 lb

## 2021-05-22 DIAGNOSIS — R6881 Early satiety: Secondary | ICD-10-CM

## 2021-05-22 DIAGNOSIS — R7303 Prediabetes: Secondary | ICD-10-CM

## 2021-05-22 DIAGNOSIS — Z9189 Other specified personal risk factors, not elsewhere classified: Secondary | ICD-10-CM

## 2021-05-22 DIAGNOSIS — Z6839 Body mass index (BMI) 39.0-39.9, adult: Secondary | ICD-10-CM | POA: Diagnosis not present

## 2021-05-22 MED ORDER — METFORMIN HCL 500 MG PO TABS
500.0000 mg | ORAL_TABLET | Freq: Two times a day (BID) | ORAL | 0 refills | Status: DC
  Filled 2021-05-22 – 2021-06-29 (×2): qty 60, 30d supply, fill #0

## 2021-05-23 ENCOUNTER — Other Ambulatory Visit (HOSPITAL_COMMUNITY): Payer: Self-pay

## 2021-05-23 MED FILL — Anastrozole Tab 1 MG: ORAL | 90 days supply | Qty: 90 | Fill #2 | Status: AC

## 2021-05-23 MED FILL — Anastrozole Tab 1 MG: ORAL | 90 days supply | Qty: 90 | Fill #2 | Status: CN

## 2021-05-23 NOTE — Progress Notes (Signed)
Chief Complaint:   OBESITY Candace [name] is here to discuss her progress with her obesity treatment plan along with follow-up of her obesity related diagnoses. Candace [name] is on the Category 3 Plan and states she is following her eating plan approximately 60% of the time. Candace [name] states she is walking 10 minutes 5 times per week.  Today's visit was #: 48 Starting weight: 249 lbs Starting date: 09/17/2017 Today's weight: 243 lbs Today's date: 05/22/2021 Total lbs lost to date: 6 Total lbs lost since last in-office visit: +5  Interim History: This is pt's first OV with me. She typically sees Candace [name]. Pt is planning reduced calorie desserts for the holidays. She is cooking Thanksgiving at home with her daughter. Candace [name] had a cruise in mid-October and only up 5 lbs since last OV.  She endorses she is happy with that.      Subjective:   1. Pre-diabetes Candace [name] has a diagnosis of prediabetes based on her elevated HgA1c and was informed this puts her at greater risk of developing diabetes. She continues to work on diet and exercise to decrease her risk of diabetes. She denies nausea or hypoglycemia. She notes that Metformin helps with sweets cravings.  Lab Results  Component Value Date   HGBA1C 6.2 (H) 12/05/2020   Lab Results  Component Value Date   INSULIN 13.5 12/05/2020   INSULIN 22.9 04/04/2020   INSULIN 12.6 09/08/2019   INSULIN 23.8 02/04/2019   INSULIN 20.6 08/06/2018   2. Early satiety Candace [name] has been off [place] since mid-October and has felt overly full and unable to finish meals on plan. This is a new issue for pt.   3. At risk for malnutrition Candace [name] is at risk for malnutrition due to being unable to eat all her foods.  Assessment/Plan:   Medications Discontinued During This Encounter  Medication Reason   metFORMIN (GLUCOPHAGE) 500 MG tablet Reorder     Meds ordered this encounter  Medications   metFORMIN (GLUCOPHAGE) 500 MG tablet    Sig: Take 1 tablet (500 mg total)  by mouth 2 (two) times daily with a meal.    Dispense:  60 tablet    Refill:  0     1. Pre-diabetes Candace [name] will continue to work on weight loss, exercise, and decreasing simple carbohydrates to help decrease the risk of diabetes.   Refill- metFORMIN (GLUCOPHAGE) 500 MG tablet; Take 1 tablet (500 mg total) by mouth 2 (two) times daily with a meal.  Dispense: 60 tablet; Refill: 0  2. Early satiety I recommend pt follow up with PCP regarding new onset early satiety in the very near future to rule out space occupying lesion or other medical reason for these new symptoms. Hold Saxenda, as pt is unable to eat her meals/foods on plan. We will reassess in the future to if pt is able to use medication or not.  3. At risk for malnutrition Candace [name] was given approximately 9 minutes of counseling today regarding prevention of malnutrition and ways to meet macronutrient goals..   4. Obesity with current BMI 37.4  Candace [name] is currently in the action stage of change. As such, her goal is to continue with weight loss efforts. She has agreed to Change to the Category 3 Plan with lunch options.   Pt plans to increase activity throughout the holidays.  Exercise goals:  Increase as tolerated.  Behavioral modification strategies: holiday eating strategies  and avoiding temptations.  Candace [name] has agreed to follow-up with our clinic in 2-3  weeks with Candace [name] or Candace [name]. She was informed of the importance of frequent follow-up visits to maximize her success with intensive lifestyle modifications for her multiple health conditions.   Objective:   Blood pressure (!) 158/81, temperature 97.7 F (36.5 C), height 5\' 7"  (1.702 m), weight 243 lb (110.2 kg), SpO2 98 %. Body mass index is 38.06 kg/m.  General: Cooperative, alert, well developed, in no acute distress. HEENT: Conjunctivae and lids unremarkable. Cardiovascular: Regular rhythm.  Lungs: Normal work of breathing. Neurologic: No focal deficits.   Lab  Results  Component Value Date   CREATININE 0.79 05/01/2021   BUN 12 05/01/2021   NA 142 05/01/2021   K 4.2 05/01/2021   CL 99 05/01/2021   CO2 29 05/01/2021   Lab Results  Component Value Date   ALT 21 12/05/2020   AST 14 12/05/2020   ALKPHOS 88 12/05/2020   BILITOT 0.3 12/05/2020   Lab Results  Component Value Date   HGBA1C 6.2 (H) 12/05/2020   HGBA1C 6.2 (H) 04/04/2020   HGBA1C 6.0 (H) 09/08/2019   HGBA1C 6.2 (H) 02/04/2019   HGBA1C 5.9 (H) 08/06/2018   Lab Results  Component Value Date   INSULIN 13.5 12/05/2020   INSULIN 22.9 04/04/2020   INSULIN 12.6 09/08/2019   INSULIN 23.8 02/04/2019   INSULIN 20.6 08/06/2018   Lab Results  Component Value Date   TSH 2.200 12/05/2020   Lab Results  Component Value Date   CHOL 172 12/05/2020   HDL 51 12/05/2020   LDLCALC 83 12/05/2020   TRIG 229 (H) 12/05/2020   CHOLHDL 3.9 04/04/2020   Lab Results  Component Value Date   VD25OH 60.1 12/05/2020   VD25OH 41.2 04/04/2020   VD25OH 42.5 09/08/2019   Lab Results  Component Value Date   WBC 7.1 04/15/2019   HGB 13.4 04/15/2019   HCT 41.4 04/15/2019   MCV 83.8 04/15/2019   PLT 297 04/15/2019    Attestation Statements:   Reviewed by clinician on day of visit: allergies, medications, problem list, medical history, surgical history, family history, social history, and previous encounter notes.  Candace [name], CMA, am acting as transcriptionist for [employer], DO.  I have reviewed the above documentation for accuracy and completeness, and I agree with the above. Candace [name], D.O.  [place] was signed into law in 2016 which includes the topic of electronic health records.  This provides immediate access to information in MyChart.  This includes consultation notes, operative notes, office notes, lab results and pathology reports.  If you have any questions about what you read please let us know at your next visit so we can discuss your  concerns and take corrective action if need be.  We are right here with you.

## 2021-05-25 ENCOUNTER — Other Ambulatory Visit (HOSPITAL_COMMUNITY): Payer: Self-pay

## 2021-06-04 NOTE — Progress Notes (Signed)
Cardiology Office Note:    Date:  06/12/2021   ID:  Candace [name], DOB 1961/04/18, MRN [id number]  PCP:  [name], MD  Cardiologist:  Elouise Munroe, MD  Electrophysiologist:  None   Referring MD: [name], MD   Chief Complaint: follow-up of chest pain  History of Present Illness:    Candace [name] is a 60 y.o. female with a history of minimal non-obstructive CAD noted on coronary CTA in 05/2021, hypertension, hyperlipidemia, type 2 diabetes mellitus, obstructive sleep apnea unable to tolerate CPAP, GERD, right breast cancer, and depression who is followed by Dr. Margaretann Loveless and presents today for follow-up of chest pain.   Patient was recently referred to Dr. Margaretann Loveless on 05/01/2021 for evaluation of sharp chest pain and pressure that occurred in 01/2021. She also noted some sharp pain with coughing. EKG showed a RBBB. Coronary CTA and Echo were ordered for further evaluation. Coronary CTA showed a coronary calcium score of 2 (65% percentile for age and sex) with only minimal non-obstructive CAD (only trivial calcium seen in the proximal LAD but no significant stenosis). Echo showed LVEF of 60-65% with normal wall motion and grade 1 diastolic dysfunction. Small PFO with predominantly left to right shunting across the atrial septum was noted.   Patient presents today for further evaluation. Here alone. Reviewed CTA and Echo results with patient. She is doing well. Chest pain has improved.  She notes a couple of very brief episodes which she describes as "twinges" in her chest but nothing concerning.  She denies any shortness of breath, orthopnea, PND.  Lower extremity edema is well controlled on her diuretics.  She denies any palpitations, lightheadedness, dizziness, syncope.  Of note, patient does have obstructive sleep apnea has been unable to tolerate CPAP after giving it a try for 1 year.  This is followed by Neurology.  Past Medical History:  Diagnosis Date   Abnormal Pap smear     Alcohol abuse    Anovulation    Anxiety    Breast cancer ([place]) 04/05/2016   right breast   Complication of anesthesia    slow to wake up   Depression    Endometrial hyperplasia    Endometrial polyp    h/o   Epidermal cyst    h/o   Gallbladder problem    GERD (gastroesophageal reflux disease)    H/O hemorrhoids    H/O menorrhagia    High risk HPV infection    Hyperlipidemia    Hypertension    Insomnia    Non-obstructive CAD    minimal non-obstructive CAD noted on coronary CTA in 05/2021   Obesity    Oligomenorrhea    Prediabetes    Restless legs syndrome    Seasonal allergies    Type 2 diabetes mellitus ([place])    Vitamin D deficiency     Past Surgical History:  Procedure Laterality Date   BREAST LUMPECTOMY Right 05/2016   radiation   BREAST LUMPECTOMY WITH RADIOACTIVE SEED AND SENTINEL LYMPH NODE BIOPSY Right 05/28/2016   Procedure: RADIOACTIVE SEED GUIDED RIGHT BREAST LUMPECTOMY WITH  RIGHT AXILLARY SENTINEL LYMPH NODE BIOPSY;  Surgeon: [name], MD;  Location: [facility];  Service: General;  Laterality: Right;   CARPAL TUNNEL RELEASE  03/26/2011   right hand   CARPAL TUNNEL RELEASE  05/28/2011   Procedure: CARPAL TUNNEL RELEASE;  Surgeon: [name];  Location: [facility];  Service: Orthopedics;  Laterality: Left;   CHOLECYSTECTOMY N/A 01/24/2013   Procedure: LAPAROSCOPIC  CHOLECYSTECTOMY WITH INTRAOPERATIVE CHOLANGIOGRAM;  Surgeon: [name], MD;  Location: [facility];  Service: General;  Laterality: N/A;   COLONOSCOPY     HYSTEROSCOPY     [place]   as child   trigger fingers  8 yrs. ago   both done months apart   uterine ablation  06/04/2010   uterine ablation      Current Medications: Current Meds  Medication Sig   anastrozole (ARIMIDEX) 1 MG tablet TAKE 1 TABLET (1 MG TOTAL) BY MOUTH DAILY.   buPROPion (WELLBUTRIN XL) 300 MG 24 hr tablet Take 1 tablet (300 mg total) by mouth every morning.   cetirizine (ZYRTEC) 10  MG tablet Take 10 mg by mouth daily. [facility]   Cholecalciferol (VITAMIN D-3) 125 MCG (5000 UT) TABS Take 5,000 Units by mouth daily. (Patient taking differently: Take 50 mg by mouth daily.)   esomeprazole (NEXIUM) 40 MG capsule Take 1 capsule (40 mg total) by mouth daily.   FLUoxetine (PROZAC) 10 MG capsule Take 1 capsule (10 mg total) by mouth daily.   losartan (COZAAR) 25 MG tablet Take 1 tablet (25 mg total) by mouth daily.   metFORMIN (GLUCOPHAGE) 500 MG tablet Take 1 tablet (500 mg total) by mouth 2 (two) times daily with a meal.   Multiple Vitamin (MULTIVITAMIN WITH MINERALS) TABS tablet Take 1 tablet by mouth daily.   rOPINIRole (REQUIP) 1 MG tablet Take 1 tablet (1 mg total) by mouth 1 to 3 hours before bedtime.   rosuvastatin (CRESTOR) 10 MG tablet Take 1 tablet (10 mg total) by mouth every evening.   triamterene-hydrochlorothiazide (MAXZIDE-25) 37.5-25 MG tablet Take 1 tablet by mouth daily.     Allergies:   Patient has no known allergies.   Social History   Socioeconomic History   Marital status: Divorced    Spouse name: Not on file   Number of children: 2   Years of education: Not on file   Highest education level: Not on file  Occupational History   Not on file  Tobacco Use   Smoking status: Never   Smokeless tobacco: Never  Substance and Sexual Activity   Alcohol use: No    Comment: none since 1996   Drug use: No   Sexual activity: Not Currently    Birth control/protection: None  Other Topics Concern   Not on file  Social History Narrative   Not on file   Social Determinants of Health   Financial Resource Strain: Not on file  Food Insecurity: Not on file  Transportation Needs: Not on file  Physical Activity: Not on file  Stress: Not on file  Social Connections: Not on file     Family History: The patient's family history includes Alcohol abuse in her father and mother; Anxiety disorder in her father and mother; Breast cancer (age  of onset: 62) in her sister; Dementia in her mother; Depression in her father and mother; Heart attack in her maternal grandmother; Heart disease in her maternal grandfather, maternal grandmother, and mother; Hyperlipidemia in her mother; Hypertension in her mother; Leukemia in her paternal grandmother; Obesity in her mother; Pulmonary fibrosis in her father; Stroke in her mother.  ROS:   Please see the history of present illness.     EKGs/Labs/Other Studies Reviewed:    The following studies were reviewed today:  Coronary CTA 05/11/2021: Impression: 1. Minimal nonobstructive CAD, CADRADS = 1. There is trivial calcium seen in the proximal LAD  but no significant stenosis 2. Coronary calcium score of 2. This was 65th percentile for age and sex matched control. 3. Normal coronary origin with right dominance. _______________  Echocardiogram 05/16/2021: Impressions:  1. Left ventricular ejection fraction, by estimation, is 60 to 65%. The  left ventricle has normal function. The left ventricle has no regional  wall motion abnormalities. There is mild concentric left ventricular  hypertrophy. Left ventricular diastolic  parameters are consistent with Grade I diastolic dysfunction (impaired  relaxation). The average left ventricular global longitudinal strain is  -16.8 %. The global longitudinal strain is normal.   2. Right ventricular systolic function is normal. The right ventricular  size is normal.   3. Left atrial size was mildly dilated.   4. The mitral valve is normal in structure. Trivial mitral valve  regurgitation. No evidence of mitral stenosis.   5. The aortic valve is tricuspid. Aortic valve regurgitation is not  visualized. Aortic valve sclerosis is present, with no evidence of aortic  valve stenosis.   6. The inferior vena cava is normal in size with greater than 50%  respiratory variability, suggesting right atrial pressure of 3 mmHg.   7. There is a small patent foramen  ovale with predominantly left to right  shunting across the atrial septum.   EKG:  EKG not ordered today.   Recent Labs: 12/05/2020: ALT 21; TSH 2.200 05/01/2021: BUN 12; Creatinine, Ser 0.79; Potassium 4.2; Sodium 142  Recent Lipid Panel    Component Value Date/Time   CHOL 172 12/05/2020 1231   TRIG 229 (H) 12/05/2020 1231   HDL 51 12/05/2020 1231   CHOLHDL 3.9 04/04/2020 0810   LDLCALC 83 12/05/2020 1231    Physical Exam:    Vital Signs: BP (!) 160/80   Pulse 94   Ht 5\' 7"  (1.702 m)   Wt 245 lb 9.6 oz (111.4 kg)   SpO2 97%   BMI 38.47 kg/m     Wt Readings from Last 3 Encounters:  06/12/21 245 lb 9.6 oz (111.4 kg)  06/05/21 243 lb (110.2 kg)  05/22/21 243 lb (110.2 kg)     General: 60 y.o. Caucasian female in no acute distress. HEENT: Normocephalic and atraumatic. Sclera clear.  Neck: Supple. No carotid bruits. No JVD. Heart: RRR. Distinct S1 and S2. No murmurs, gallops, or rubs. Radial pulses 2+ and equal bilaterally. Lungs: No increased work of breathing. Clear to ausculation bilaterally. No wheezes, rhonchi, or rales.  MSK: Normal strength and tone for age.  Extremities: No lower extremity edema.    Skin: Warm and dry. Neuro: Alert and oriented x3. No focal deficits. Psych: Normal affect. Responds appropriately.  Assessment:    1. History of chest pain   2. Non-obstructive CAD   3. Primary hypertension   4. Hyperlipidemia, unspecified hyperlipidemia type   5. Type 2 diabetes mellitus with complication, without long-term current use of insulin ([place])   6. Obstructive sleep apnea     Plan:    Atypical Chest Pain Minimal CAD Patient reported atypical chest pain at office visit last month. Coronary CTA showed coronary calcium score of 2 (65% percentile for age and sex) with only minimal non-obstructive CAD (only trivial calcium seen in the proximal LAD but no significant stenosis). Echo showed LVEF of 60-65% with normal wall motion and grade 1 diastolic  dysfunction. - Chest pain improved. She reports occasional very brief "twinges" that last only 1-2 seconds at a time but no angina. - Given only trivial calcium noted on  CTA, not started on Aspirin. - Continue statin.  Hypertension BP elevated at 160/80. Patient states systolic BP typically in the 140s to 150s.  - Continue Triamterene-HCTZ 37.5-25mg  daily. - Will start Losartan 25mg  daily. - Will repeat BMET in 1-2 weeks.  - Advised patient to let us know if BP staying above 130/80 with addition of Losartan.  Hyperlipidemia Lipid panel in 11/2020: Total Cholesterol, Triglycerides 229, HDL 51, LDL 83. LDL goal <70 given minimal CAD. - Currently on Crestor 10mg  daily. Continue. - Labs followed by PCP. If LDL still above goal at time of next check, would increase Crestor.   Type 2 Diabetes Mellitus  Hemoglobin A1c 6.2 in 11/2020.  - On Metformin. - Management per PCP.  Obstructive Sleep Apnea Unable to tolerate CPAP. - Discussed how losing weight can help sleep apnea. - Followed by Neurology.  PFO Small PFO with predominantly left to right shunting noted on recent Echo. - No additional work-up necessary at this.   Disposition: Follow up in 6 months.   Medication Adjustments/Labs and Tests Ordered: Current medicines are reviewed at length with the patient today.  Concerns regarding medicines are outlined above.  Orders Placed This Encounter  Procedures   Basic metabolic panel    Meds ordered this encounter  Medications   losartan (COZAAR) 25 MG tablet    Sig: Take 1 tablet (25 mg total) by mouth daily.    Dispense:  90 tablet    Refill:  3     Patient Instructions  Medication Instructions:  Start Losartan 25 mg ( Take 1 Tablet Daily). *If you need a refill on your cardiac medications before your next appointment, please call your pharmacy*   Lab Work: BMET : To Be Done 1-2 Weeks. If you have labs (blood work) drawn today and your tests are completely normal, you  will receive your results only by: [facility] (if you have MyChart) OR A paper copy in the mail If you have any lab test that is abnormal or we need to change your treatment, we will call you to review the results.   Testing/Procedures: No Testing   Follow-Up: At [facility], you and your health needs are our priority.  As part of our continuing mission to provide you with exceptional heart care, we have created designated Provider Care Teams.  These Care Teams include your primary Cardiologist (physician) and Advanced Practice Providers (APPs -  Physician Assistants and Nurse Practitioners) who all work together to provide you with the care you need, when you need it.  We recommend signing up for the patient portal called "MyChart".  Sign up information is provided on this After Visit Summary.  MyChart is used to connect with patients for Virtual Visits (Telemedicine).  Patients are able to view lab/test results, encounter notes, upcoming appointments, etc.  Non-urgent messages can be sent to your provider as well.   To learn more about what you can do with MyChart, go to [email or web address].    Your next appointment:   6 month(s)  The format for your next appointment:   In Person  Provider:   Elouise Munroe, MD {     Signed, Darreld Mclean, PA-C  06/12/2021 12:32 PM    [place]

## 2021-06-05 ENCOUNTER — Ambulatory Visit (INDEPENDENT_AMBULATORY_CARE_PROVIDER_SITE_OTHER): Payer: 59 | Admitting: Family Medicine

## 2021-06-05 ENCOUNTER — Encounter (INDEPENDENT_AMBULATORY_CARE_PROVIDER_SITE_OTHER): Payer: Self-pay | Admitting: Family Medicine

## 2021-06-05 ENCOUNTER — Other Ambulatory Visit: Payer: Self-pay

## 2021-06-05 VITALS — BP 122/76 | Temp 98.4°F | Ht 67.0 in | Wt 243.0 lb

## 2021-06-05 DIAGNOSIS — R7303 Prediabetes: Secondary | ICD-10-CM

## 2021-06-05 DIAGNOSIS — Z9189 Other specified personal risk factors, not elsewhere classified: Secondary | ICD-10-CM

## 2021-06-05 DIAGNOSIS — Z6838 Body mass index (BMI) 38.0-38.9, adult: Secondary | ICD-10-CM

## 2021-06-05 NOTE — Progress Notes (Signed)
Chief Complaint:   OBESITY Candace [name] is here to discuss her progress with her obesity treatment plan along with follow-up of her obesity related diagnoses. Candace [name] is on the Category 3 Plan with lunch options and states she is following her eating plan approximately 50% of the time. Candace [name] states she is doing some walking.   Today's visit was #: 17 Starting weight: 249 lbs Starting date: 09/17/2017 Today's weight: 243 lbs Today's date: 06/05/2021 Total lbs lost to date: 6 Total lbs lost since last in-office visit: 0  Interim History: Candace [name] has done well maintaining her weight even over Thanksgiving. She is working on increasing her walking. She feels she will be able to do well in December. She is not on Saxenda any longer and her hunger is mostly controlled.  Subjective:   1. Pre-diabetes Candace [name] is working on diet and exercise, and she is no longer on GLP-1. No change in polyphagia, and she is stable on metformin.  2. At risk for diabetes mellitus Candace [name] is at higher than average risk for developing diabetes due to obesity.   Assessment/Plan:   1. Pre-diabetes Candace [name] will continue with diet, exercise, and continue metformin to help decrease the risk of diabetes.   2. At risk for diabetes mellitus Candace [name] was given approximately 15 minutes of diabetes education and counseling today. We discussed intensive lifestyle modifications today with an emphasis on weight loss as well as increasing exercise and decreasing simple carbohydrates in her diet. We also reviewed medication options with an emphasis on risk versus benefit of those discussed.   Repetitive spaced learning was employed today to elicit superior memory formation and behavioral change.  3. Obesity with current BMI of 38.1 Candace [name] is currently in the action stage of change. As such, her goal is to continue with weight loss efforts. She has agreed to the Category 3 Plan.   We discussed various medication options to help  Candace [name] with her weight loss efforts and we both agreed to discontinue Saxenda.  Exercise goals: As is.  Behavioral modification strategies: better snacking choices and travel eating strategies.  Candace [name] has agreed to follow-up with our clinic in 4 weeks. She was informed of the importance of frequent follow-up visits to maximize her success with intensive lifestyle modifications for her multiple health conditions.   Objective:   Blood pressure 122/76, temperature 98.4 F (36.9 C), height 5\' 7"  (1.702 m), weight 243 lb (110.2 kg). Body mass index is 38.06 kg/m.  General: Cooperative, alert, well developed, in no acute distress. HEENT: Conjunctivae and lids unremarkable. Cardiovascular: Regular rhythm.  Lungs: Normal work of breathing. Neurologic: No focal deficits.   Lab Results  Component Value Date   CREATININE 0.79 05/01/2021   BUN 12 05/01/2021   NA 142 05/01/2021   K 4.2 05/01/2021   CL 99 05/01/2021   CO2 29 05/01/2021   Lab Results  Component Value Date   ALT 21 12/05/2020   AST 14 12/05/2020   ALKPHOS 88 12/05/2020   BILITOT 0.3 12/05/2020   Lab Results  Component Value Date   HGBA1C 6.2 (H) 12/05/2020   HGBA1C 6.2 (H) 04/04/2020   HGBA1C 6.0 (H) 09/08/2019   HGBA1C 6.2 (H) 02/04/2019   HGBA1C 5.9 (H) 08/06/2018   Lab Results  Component Value Date   INSULIN 13.5 12/05/2020   INSULIN 22.9 04/04/2020   INSULIN 12.6 09/08/2019   INSULIN 23.8 02/04/2019   INSULIN 20.6 08/06/2018   Lab Results  Component Value Date   TSH  2.200 12/05/2020   Lab Results  Component Value Date   CHOL 172 12/05/2020   HDL 51 12/05/2020   LDLCALC 83 12/05/2020   TRIG 229 (H) 12/05/2020   CHOLHDL 3.9 04/04/2020   Lab Results  Component Value Date   VD25OH 60.1 12/05/2020   VD25OH 41.2 04/04/2020   VD25OH 42.5 09/08/2019   Lab Results  Component Value Date   WBC 7.1 04/15/2019   HGB 13.4 04/15/2019   HCT 41.4 04/15/2019   MCV 83.8 04/15/2019   PLT 297 04/15/2019    No results found for: IRON, TIBC, FERRITIN  Attestation Statements:   Reviewed by clinician on day of visit: allergies, medications, problem list, medical history, surgical history, family history, social history, and previous encounter notes.   I, Trixie Dredge, am acting as transcriptionist for Dennard Nip, MD.  I have reviewed the above documentation for accuracy and completeness, and I agree with the above. -  Dennard Nip, MD

## 2021-06-08 ENCOUNTER — Other Ambulatory Visit (HOSPITAL_COMMUNITY): Payer: Self-pay

## 2021-06-09 ENCOUNTER — Encounter: Payer: Self-pay | Admitting: Student

## 2021-06-09 DIAGNOSIS — K219 Gastro-esophageal reflux disease without esophagitis: Secondary | ICD-10-CM | POA: Insufficient documentation

## 2021-06-09 DIAGNOSIS — E119 Type 2 diabetes mellitus without complications: Secondary | ICD-10-CM | POA: Insufficient documentation

## 2021-06-09 DIAGNOSIS — E785 Hyperlipidemia, unspecified: Secondary | ICD-10-CM | POA: Insufficient documentation

## 2021-06-09 DIAGNOSIS — I2510 Atherosclerotic heart disease of native coronary artery without angina pectoris: Secondary | ICD-10-CM | POA: Insufficient documentation

## 2021-06-12 ENCOUNTER — Ambulatory Visit: Payer: 59 | Admitting: Student

## 2021-06-12 ENCOUNTER — Encounter: Payer: Self-pay | Admitting: Student

## 2021-06-12 ENCOUNTER — Other Ambulatory Visit (HOSPITAL_COMMUNITY): Payer: Self-pay

## 2021-06-12 VITALS — BP 160/80 | HR 94 | Ht 67.0 in | Wt 245.6 lb

## 2021-06-12 DIAGNOSIS — I2510 Atherosclerotic heart disease of native coronary artery without angina pectoris: Secondary | ICD-10-CM

## 2021-06-12 DIAGNOSIS — I10 Essential (primary) hypertension: Secondary | ICD-10-CM | POA: Diagnosis not present

## 2021-06-12 DIAGNOSIS — Z87898 Personal history of other specified conditions: Secondary | ICD-10-CM | POA: Diagnosis not present

## 2021-06-12 DIAGNOSIS — G4733 Obstructive sleep apnea (adult) (pediatric): Secondary | ICD-10-CM | POA: Diagnosis not present

## 2021-06-12 DIAGNOSIS — E785 Hyperlipidemia, unspecified: Secondary | ICD-10-CM | POA: Diagnosis not present

## 2021-06-12 DIAGNOSIS — E118 Type 2 diabetes mellitus with unspecified complications: Secondary | ICD-10-CM | POA: Diagnosis not present

## 2021-06-12 DIAGNOSIS — K219 Gastro-esophageal reflux disease without esophagitis: Secondary | ICD-10-CM

## 2021-06-12 MED ORDER — LOSARTAN POTASSIUM 25 MG PO TABS
25.0000 mg | ORAL_TABLET | Freq: Every day | ORAL | 3 refills | Status: DC
  Filled 2021-06-12: qty 90, 90d supply, fill #0
  Filled 2021-09-14: qty 90, 90d supply, fill #1

## 2021-06-12 NOTE — Patient Instructions (Signed)
Medication Instructions:  Start Losartan 25 mg ( Take 1 Tablet Daily). *If you need a refill on your cardiac medications before your next appointment, please call your pharmacy*   Lab Work: BMET : To Be Done 1-2 Weeks. If you have labs (blood work) drawn today and your tests are completely normal, you will receive your results only by: [facility] (if you have MyChart) OR A paper copy in the mail If you have any lab test that is abnormal or we need to change your treatment, we will call you to review the results.   Testing/Procedures: No Testing   Follow-Up: At [facility], you and your health needs are our priority.  As part of our continuing mission to provide you with exceptional heart care, we have created designated Provider Care Teams.  These Care Teams include your primary Cardiologist (physician) and Advanced Practice Providers (APPs -  Physician Assistants and Nurse Practitioners) who all work together to provide you with the care you need, when you need it.  We recommend signing up for the patient portal called "MyChart".  Sign up information is provided on this After Visit Summary.  MyChart is used to connect with patients for Virtual Visits (Telemedicine).  Patients are able to view lab/test results, encounter notes, upcoming appointments, etc.  Non-urgent messages can be sent to your provider as well.   To learn more about what you can do with MyChart, go to [email or web address].    Your next appointment:   6 month(s)  The format for your next appointment:   In Person  Provider:   Elouise Munroe, MD {

## 2021-06-15 ENCOUNTER — Other Ambulatory Visit (HOSPITAL_COMMUNITY): Payer: Self-pay

## 2021-06-19 ENCOUNTER — Ambulatory Visit (INDEPENDENT_AMBULATORY_CARE_PROVIDER_SITE_OTHER): Payer: 59 | Admitting: Family Medicine

## 2021-06-29 ENCOUNTER — Other Ambulatory Visit (HOSPITAL_COMMUNITY): Payer: Self-pay

## 2021-07-05 ENCOUNTER — Other Ambulatory Visit: Payer: Self-pay

## 2021-07-05 ENCOUNTER — Encounter (INDEPENDENT_AMBULATORY_CARE_PROVIDER_SITE_OTHER): Payer: Self-pay | Admitting: Family Medicine

## 2021-07-05 ENCOUNTER — Ambulatory Visit (INDEPENDENT_AMBULATORY_CARE_PROVIDER_SITE_OTHER): Payer: 59 | Admitting: Family Medicine

## 2021-07-05 VITALS — BP 145/81 | Temp 98.0°F | Ht 67.0 in | Wt 244.0 lb

## 2021-07-05 DIAGNOSIS — R7303 Prediabetes: Secondary | ICD-10-CM | POA: Diagnosis not present

## 2021-07-05 DIAGNOSIS — E559 Vitamin D deficiency, unspecified: Secondary | ICD-10-CM

## 2021-07-05 DIAGNOSIS — I10 Essential (primary) hypertension: Secondary | ICD-10-CM | POA: Diagnosis not present

## 2021-07-05 DIAGNOSIS — Z6838 Body mass index (BMI) 38.0-38.9, adult: Secondary | ICD-10-CM | POA: Diagnosis not present

## 2021-07-09 NOTE — Progress Notes (Signed)
Chief Complaint:   OBESITY Candace [name] is here to discuss her progress with her obesity treatment plan along with follow-up of her obesity related diagnoses. Candace [name] is on the Category 3 Plan and states she is following her eating plan approximately 50% of the time. Candace [name] states she is walking for 20 minutes 3 times per week.  Today's visit was #: 15 Starting weight: 249 lbs Starting date: 09/17/2017 Today's weight: 244 lbs Today's date: 07/05/2021 Total lbs lost to date: 5 Total lbs lost since last in-office visit: 0  Interim History: Candace [name] has done well minimizing holiday weight gain. She is working on being mindful and she has had less temptations this year.  Subjective:   1. Essential hypertension Candace [name] recently started on losartan by Cardiology. Her blood pressure is still above goal, but she may not have been on it long enough to see results. She is working on her diet and exercise.  2. Vitamin D deficiency Candace [name] is on Vitamin D OTC, but her level is not yet at goal. No side effects were noted.   3. Pre-diabetes Candace [name] is working on decreasing simple carbohydrates and she is stable on metformin.  Assessment/Plan:   1. Essential hypertension Candace [name] will continue her medications, diet, and exercise to improve blood pressure control. She will continue to follow up as she continues her lifestyle modifications.  2. Vitamin D deficiency Candace [name] will continue OTC Vitamin D, and we will recheck labs in 1-2 months. She will follow-up for routine testing of Vitamin D, at least 2-3 times per year to avoid over-replacement.  3. Pre-diabetes Candace [name] will continue her diet, exercise, and medications to help decrease the risk of diabetes. We will recheck labs in 1-2 months.  4. Obesity with current BMI of 38.3 Candace [name] is currently in the action stage of change. As such, her goal is to continue with weight loss efforts. She has agreed to the Category 3 Plan.   Exercise goals: As  is.  Behavioral modification strategies: increasing lean protein intake.  Candace [name] has agreed to follow-up with our clinic in 3 weeks. She was informed of the importance of frequent follow-up visits to maximize her success with intensive lifestyle modifications for her multiple health conditions.   Objective:   Blood pressure (!) 145/81, temperature 98 F (36.7 C), temperature source Oral, height 5\' 7"  (1.702 m), weight 244 lb (110.7 kg), SpO2 95 %. Body mass index is 38.22 kg/m.  General: Cooperative, alert, well developed, in no acute distress. HEENT: Conjunctivae and lids unremarkable. Cardiovascular: Regular rhythm.  Lungs: Normal work of breathing. Neurologic: No focal deficits.   Lab Results  Component Value Date   CREATININE 0.79 05/01/2021   BUN 12 05/01/2021   NA 142 05/01/2021   K 4.2 05/01/2021   CL 99 05/01/2021   CO2 29 05/01/2021   Lab Results  Component Value Date   ALT 21 12/05/2020   AST 14 12/05/2020   ALKPHOS 88 12/05/2020   BILITOT 0.3 12/05/2020   Lab Results  Component Value Date   HGBA1C 6.2 (H) 12/05/2020   HGBA1C 6.2 (H) 04/04/2020   HGBA1C 6.0 (H) 09/08/2019   HGBA1C 6.2 (H) 02/04/2019   HGBA1C 5.9 (H) 08/06/2018   Lab Results  Component Value Date   INSULIN 13.5 12/05/2020   INSULIN 22.9 04/04/2020   INSULIN 12.6 09/08/2019   INSULIN 23.8 02/04/2019   INSULIN 20.6 08/06/2018   Lab Results  Component Value Date   TSH 2.200 12/05/2020   Lab Results  Component Value Date   CHOL 172 12/05/2020   HDL 51 12/05/2020   LDLCALC 83 12/05/2020   TRIG 229 (H) 12/05/2020   CHOLHDL 3.9 04/04/2020   Lab Results  Component Value Date   VD25OH 60.1 12/05/2020   VD25OH 41.2 04/04/2020   VD25OH 42.5 09/08/2019   Lab Results  Component Value Date   WBC 7.1 04/15/2019   HGB 13.4 04/15/2019   HCT 41.4 04/15/2019   MCV 83.8 04/15/2019   PLT 297 04/15/2019   No results found for: IRON, TIBC, FERRITIN  Attestation Statements:   Reviewed  by clinician on day of visit: allergies, medications, problem list, medical history, surgical history, family history, social history, and previous encounter notes.  Time spent on visit including pre-visit chart review and post-visit care and charting was 32 minutes.    I, Trixie Dredge, am acting as transcriptionist for Dennard Nip, MD.  I have reviewed the above documentation for accuracy and completeness, and I agree with the above. -  Dennard Nip, MD

## 2021-07-30 ENCOUNTER — Other Ambulatory Visit (INDEPENDENT_AMBULATORY_CARE_PROVIDER_SITE_OTHER): Payer: Self-pay | Admitting: Family Medicine

## 2021-07-30 ENCOUNTER — Other Ambulatory Visit (HOSPITAL_COMMUNITY): Payer: Self-pay

## 2021-07-30 ENCOUNTER — Other Ambulatory Visit: Payer: Self-pay

## 2021-07-30 DIAGNOSIS — R7303 Prediabetes: Secondary | ICD-10-CM

## 2021-07-31 ENCOUNTER — Other Ambulatory Visit (HOSPITAL_COMMUNITY): Payer: Self-pay

## 2021-07-31 MED ORDER — METFORMIN HCL 500 MG PO TABS
500.0000 mg | ORAL_TABLET | Freq: Two times a day (BID) | ORAL | 0 refills | Status: DC
  Filled 2021-07-31: qty 60, 30d supply, fill #0

## 2021-07-31 NOTE — Telephone Encounter (Signed)
Dr.Beasley 

## 2021-07-31 NOTE — Telephone Encounter (Signed)
LAST APPOINTMENT DATE: 07/05/21 NEXT APPOINTMENT DATE: 08/07/21   [facility] [address] [place] [address] Phone: [phone] Fax: [place] [address] Phone: [phone] Fax: [phone]  Patient is requesting a refill of the following medications: Requested Prescriptions   Pending Prescriptions Disp Refills   metFORMIN (GLUCOPHAGE) 500 MG tablet 60 tablet 0    Sig: Take 1 tablet (500 mg total) by mouth 2 (two) times daily with a meal.    Date last filled: 05/22/21 Previously prescribed by Dr. Raliegh Scarlet  Lab Results  Component Value Date   HGBA1C 6.2 (H) 12/05/2020   HGBA1C 6.2 (H) 04/04/2020   HGBA1C 6.0 (H) 09/08/2019   Lab Results  Component Value Date   LDLCALC 83 12/05/2020   CREATININE 0.79 05/01/2021   Lab Results  Component Value Date   VD25OH 60.1 12/05/2020   VD25OH 41.2 04/04/2020   VD25OH 42.5 09/08/2019    BP Readings from Last 3 Encounters:  07/05/21 (!) 145/81  06/12/21 (!) 160/80  06/05/21 122/76

## 2021-08-01 ENCOUNTER — Other Ambulatory Visit (HOSPITAL_COMMUNITY): Payer: Self-pay

## 2021-08-01 ENCOUNTER — Other Ambulatory Visit: Payer: Self-pay | Admitting: Oncology

## 2021-08-03 ENCOUNTER — Other Ambulatory Visit (HOSPITAL_COMMUNITY): Payer: Self-pay

## 2021-08-03 MED ORDER — ANASTROZOLE 1 MG PO TABS
1.0000 mg | ORAL_TABLET | Freq: Every day | ORAL | 6 refills | Status: DC
  Filled 2021-08-03: qty 90, 90d supply, fill #0
  Filled 2021-12-11: qty 90, 90d supply, fill #1
  Filled 2022-03-27: qty 90, 90d supply, fill #2
  Filled 2022-07-26: qty 90, 90d supply, fill #3

## 2021-08-03 NOTE — Telephone Encounter (Signed)
Dr. Lindi [name] is seeing her in March and she is supposed to continue anastrazole until March 2023. Gardiner Rhyme, RN

## 2021-08-07 ENCOUNTER — Encounter (INDEPENDENT_AMBULATORY_CARE_PROVIDER_SITE_OTHER): Payer: Self-pay | Admitting: Family Medicine

## 2021-08-07 ENCOUNTER — Other Ambulatory Visit: Payer: Self-pay

## 2021-08-07 ENCOUNTER — Ambulatory Visit (INDEPENDENT_AMBULATORY_CARE_PROVIDER_SITE_OTHER): Payer: 59 | Admitting: Family Medicine

## 2021-08-07 ENCOUNTER — Other Ambulatory Visit (HOSPITAL_COMMUNITY): Payer: Self-pay

## 2021-08-07 VITALS — BP 138/84 | HR 80 | Temp 98.5°F | Ht 67.0 in | Wt 244.0 lb

## 2021-08-07 DIAGNOSIS — E669 Obesity, unspecified: Secondary | ICD-10-CM | POA: Diagnosis not present

## 2021-08-07 DIAGNOSIS — Z6838 Body mass index (BMI) 38.0-38.9, adult: Secondary | ICD-10-CM

## 2021-08-07 DIAGNOSIS — Z7984 Long term (current) use of oral hypoglycemic drugs: Secondary | ICD-10-CM

## 2021-08-07 DIAGNOSIS — I10 Essential (primary) hypertension: Secondary | ICD-10-CM

## 2021-08-07 DIAGNOSIS — E1169 Type 2 diabetes mellitus with other specified complication: Secondary | ICD-10-CM

## 2021-08-08 ENCOUNTER — Other Ambulatory Visit (HOSPITAL_COMMUNITY): Payer: Self-pay

## 2021-08-08 MED ORDER — SEMAGLUTIDE-WEIGHT MANAGEMENT 0.25 MG/0.5ML ~~LOC~~ SOAJ
0.2500 mg | SUBCUTANEOUS | 0 refills | Status: DC
  Filled 2021-08-08 – 2021-08-17 (×2): qty 2, 28d supply, fill #0

## 2021-08-08 NOTE — Progress Notes (Signed)
Chief Complaint:   OBESITY Candace [name] is here to discuss her progress with her obesity treatment plan along with follow-up of her obesity related diagnoses. Candace [name] is on the Category 3 Plan and states she is following her eating plan approximately 75% of the time. Candace [name] states she is at the [facility] for 25 minutes 2 times per week.  Today's visit was #: 94 Starting weight: 249 lbs Starting date: 09/17/2017 Today's weight: 244 lbs Today's date: 08/07/2021 Total lbs lost to date: 5 Total lbs lost since last in-office visit: 0  Interim History: Candace [name] has done well with maintaining her weight. She used to be on Saxenda, but she stopped while traveling. She is ready to get back on a GLP-1.  Subjective:   1. Type 2 diabetes mellitus with other specified complication, without long-term current use of insulin (HCC) Candace [name] is working on diet and weight loss. She has no problems with metformin.  2. Essential hypertension Candace [name]'s blood pressure is stable on her medications. She will be restarting a GLP-1, which should help further.  Assessment/Plan:   1. Type 2 diabetes mellitus with other specified complication, without long-term current use of insulin (HCC) Candace [name] will continue metformin as is, and she will restart a GLP-1 and we will continue to follow. Good blood sugar control is important to decrease the likelihood of diabetic complications such as nephropathy, neuropathy, limb loss, blindness, coronary artery disease, and death. Intensive lifestyle modification including diet, exercise and weight loss are the first line of treatment for diabetes.   2. Essential hypertension Candace [name] will continue her diet, exercise, and medications to improve blood pressure control. We will recheck her blood pressure in 3 weeks.  3. Obesity with current BMI 38.3 Candace [name] is currently in the action stage of change. As such, her goal is to continue with weight loss efforts. She has agreed to the Category 3 Plan.    We discussed various medication options to help Candace [name] with her weight loss efforts and we both agreed to start Wegovy 0.25 mg weekly, with no refills.  Exercise goals: As is.  Behavioral modification strategies: increasing lean protein intake and no skipping meals.  Candace [name] has agreed to follow-up with our clinic in 3 weeks. She was informed of the importance of frequent follow-up visits to maximize her success with intensive lifestyle modifications for her multiple health conditions.   Objective:   Blood pressure 138/84, pulse 80, temperature 98.5 F (36.9 C), height 5\' 7"  (1.702 m), weight 244 lb (110.7 kg), SpO2 97 %. Body mass index is 38.22 kg/m.  General: Cooperative, alert, well developed, in no acute distress. HEENT: Conjunctivae and lids unremarkable. Cardiovascular: Regular rhythm.  Lungs: Normal work of breathing. Neurologic: No focal deficits.   Lab Results  Component Value Date   CREATININE 0.79 05/01/2021   BUN 12 05/01/2021   NA 142 05/01/2021   K 4.2 05/01/2021   CL 99 05/01/2021   CO2 29 05/01/2021   Lab Results  Component Value Date   ALT 21 12/05/2020   AST 14 12/05/2020   ALKPHOS 88 12/05/2020   BILITOT 0.3 12/05/2020   Lab Results  Component Value Date   HGBA1C 6.2 (H) 12/05/2020   HGBA1C 6.2 (H) 04/04/2020   HGBA1C 6.0 (H) 09/08/2019   HGBA1C 6.2 (H) 02/04/2019   HGBA1C 5.9 (H) 08/06/2018   Lab Results  Component Value Date   INSULIN 13.5 12/05/2020   INSULIN 22.9 04/04/2020   INSULIN 12.6 09/08/2019   INSULIN 23.8 02/04/2019  INSULIN 20.6 08/06/2018   Lab Results  Component Value Date   TSH 2.200 12/05/2020   Lab Results  Component Value Date   CHOL 172 12/05/2020   HDL 51 12/05/2020   LDLCALC 83 12/05/2020   TRIG 229 (H) 12/05/2020   CHOLHDL 3.9 04/04/2020   Lab Results  Component Value Date   VD25OH 60.1 12/05/2020   VD25OH 41.2 04/04/2020   VD25OH 42.5 09/08/2019   Lab Results  Component Value Date   WBC 7.1  04/15/2019   HGB 13.4 04/15/2019   HCT 41.4 04/15/2019   MCV 83.8 04/15/2019   PLT 297 04/15/2019   No results found for: IRON, TIBC, FERRITIN  Attestation Statements:   Reviewed by clinician on day of visit: allergies, medications, problem list, medical history, surgical history, family history, social history, and previous encounter notes.   I, Trixie Dredge, am acting as transcriptionist for Dennard Nip, MD.  I have reviewed the above documentation for accuracy and completeness, and I agree with the above. -  Dennard Nip, MD

## 2021-08-13 ENCOUNTER — Telehealth (INDEPENDENT_AMBULATORY_CARE_PROVIDER_SITE_OTHER): Payer: Self-pay | Admitting: Family Medicine

## 2021-08-13 ENCOUNTER — Encounter (INDEPENDENT_AMBULATORY_CARE_PROVIDER_SITE_OTHER): Payer: Self-pay

## 2021-08-13 NOTE — Telephone Encounter (Signed)
Prior authorization approved for [facility].

## 2021-08-17 ENCOUNTER — Other Ambulatory Visit (HOSPITAL_COMMUNITY): Payer: Self-pay

## 2021-08-22 ENCOUNTER — Ambulatory Visit (INDEPENDENT_AMBULATORY_CARE_PROVIDER_SITE_OTHER): Payer: 59 | Admitting: Orthopaedic Surgery

## 2021-08-22 ENCOUNTER — Other Ambulatory Visit: Payer: Self-pay

## 2021-08-22 ENCOUNTER — Encounter: Payer: Self-pay | Admitting: Orthopaedic Surgery

## 2021-08-22 DIAGNOSIS — M65331 Trigger finger, right middle finger: Secondary | ICD-10-CM | POA: Diagnosis not present

## 2021-08-22 NOTE — Progress Notes (Signed)
Candace [name] is well-known to me.  She has been having active triggering of her right hand middle finger.  We have tried conservative treatment with multiple steroid injections.  She still has triggering and pain at this point wants to discuss surgical intervention given the failure of conservative treatment.  She is otherwise healthy with no significant active medical issues..  She is very active and does a lot of typing.  She is right-hand dominant.  Examination of her right hand does show pain over the middle finger A1 pulley with active triggering.  I did talk in detail about what the surgery involves with an A1 pulley release in the right hand.  I discussed the risk and benefits of surgery and how the surgery is performed in detail.  All questions and concerns were answered and addressed.  We will work on getting this scheduled.  Our surgery scheduler will be in touch.  We would then see her back in 2 weeks postoperative for suture removal.  I would let her use her hand as comfort allows even the day of surgery.

## 2021-08-28 ENCOUNTER — Other Ambulatory Visit (HOSPITAL_COMMUNITY): Payer: Self-pay

## 2021-08-28 ENCOUNTER — Other Ambulatory Visit: Payer: Self-pay

## 2021-08-28 ENCOUNTER — Ambulatory Visit (INDEPENDENT_AMBULATORY_CARE_PROVIDER_SITE_OTHER): Payer: 59 | Admitting: Family Medicine

## 2021-08-28 ENCOUNTER — Encounter (INDEPENDENT_AMBULATORY_CARE_PROVIDER_SITE_OTHER): Payer: Self-pay | Admitting: Family Medicine

## 2021-08-28 VITALS — BP 128/81 | HR 89 | Temp 98.3°F | Ht 67.0 in | Wt 247.0 lb

## 2021-08-28 DIAGNOSIS — E1169 Type 2 diabetes mellitus with other specified complication: Secondary | ICD-10-CM | POA: Diagnosis not present

## 2021-08-28 DIAGNOSIS — I10 Essential (primary) hypertension: Secondary | ICD-10-CM

## 2021-08-28 DIAGNOSIS — Z7984 Long term (current) use of oral hypoglycemic drugs: Secondary | ICD-10-CM

## 2021-08-28 DIAGNOSIS — Z6839 Body mass index (BMI) 39.0-39.9, adult: Secondary | ICD-10-CM

## 2021-08-28 DIAGNOSIS — R7303 Prediabetes: Secondary | ICD-10-CM

## 2021-08-28 DIAGNOSIS — E559 Vitamin D deficiency, unspecified: Secondary | ICD-10-CM

## 2021-08-28 DIAGNOSIS — Z9189 Other specified personal risk factors, not elsewhere classified: Secondary | ICD-10-CM

## 2021-08-28 MED ORDER — METFORMIN HCL 500 MG PO TABS
500.0000 mg | ORAL_TABLET | Freq: Two times a day (BID) | ORAL | 0 refills | Status: DC
  Filled 2021-08-28: qty 60, 30d supply, fill #0

## 2021-08-29 NOTE — Progress Notes (Signed)
Chief Complaint:   OBESITY Candace [name] is here to discuss her progress with her obesity treatment plan along with follow-up of her obesity related diagnoses. Candace [name] is on the Category 3 Plan and states she is following her eating plan approximately 60% of the time. Candace [name] states she is at the [facility] doing cardio for 50-60 minutes 2 times per week.  Today's visit was #: 57 Starting weight: 249 lbs Starting date: 09/17/2017 Today's weight: 247 lbs Today's date: 08/28/2021 Total lbs lost to date: 2 Total lbs lost since last in-office visit: 0  Interim History: [name] has been struggling with craving sweets since her last visit. She has been eating more candy. She has no problems with following the plan for breakfast. She finds that she gets off track at work especially if she is hungry. She may miss due to her schedule. She tried [name] in the past, but she stopped while traveling. She was prescribed [facility] but she hasn't started it yet.  Subjective:   1. Type 2 diabetes mellitus with other specified complication, without long-term current use of insulin (HCC) Candace [name] is taking metformin, and she denies side effects. Last A1c ws 6.2.  2. Essential hypertension Candace [name]'s blood pressure looks better today.  3. Vitamin D deficiency Candace [name]'s last Vit D looked better. She is taking Vitamin D 5,000 IU daily OTC.  4. At risk for heart disease Candace [name] is at higher than average risk for cardiovascular disease due to obesity.  Assessment/Plan:   1. Type 2 diabetes mellitus with other specified complication, without long-term current use of insulin (HCC) We will refill metformin 500 mg BID for 1 month. We will obtain labs at her next visit. Candace [name] will continue working on dietary changes, exercise, and weight loss.  - metFORMIN (GLUCOPHAGE) 500 MG tablet; Take 1 tablet (500 mg total) by mouth 2 (two) times daily with a meal.  Dispense: 60 tablet; Refill: 0  2. Essential hypertension Candace [name] will  continue to follow up with her primary care physician. She will continue her medications as directed. She will continue working on dietary changes, exercise, and weight loss.  3. Vitamin D deficiency Candace [name] will continue OTC Vitamin D, and we will obtain labs at her next visit. She will follow-up for routine testing of Vitamin D, at least 2-3 times per year to avoid over-replacement.  4. At risk for heart disease Candace [name] was given approximately 15 minutes of coronary artery disease prevention counseling today. She is 61 y.o. female and has risk factors for heart disease including obesity. We discussed intensive lifestyle modifications today with an emphasis on specific weight loss instructions and strategies.  Repetitive spaced learning was employed today to elicit superior memory formation and behavioral change.   5. Obesity with current BMI 38.8 Candace [name] is currently in the action stage of change. As such, her goal is to continue with weight loss efforts. She has agreed to keeping a food journal and adhering to recommended goals of 1400-1500 calories and 100 grams of protein daily.   Start [employer] handout was given.  Exercise goals: As is.  Behavioral modification strategies: increasing lean protein intake, increasing water intake, no skipping meals, and meal planning and cooking strategies.  Candace [name] has agreed to follow-up with our clinic in 3 weeks. She was informed of the importance of frequent follow-up visits to maximize her success with intensive lifestyle modifications for her multiple health conditions.   Objective:   Blood pressure 128/81, pulse 89, temperature 98.3 F (  36.8 C), height 5\' 7"  (1.702 m), weight 247 lb (112 kg), SpO2 99 %. Body mass index is 38.69 kg/m.  General: Cooperative, alert, well developed, in no acute distress. HEENT: Conjunctivae and lids unremarkable. Cardiovascular: Regular rhythm.  Lungs: Normal work of breathing. Neurologic: No focal  deficits.   Lab Results  Component Value Date   CREATININE 0.79 05/01/2021   BUN 12 05/01/2021   NA 142 05/01/2021   K 4.2 05/01/2021   CL 99 05/01/2021   CO2 29 05/01/2021   Lab Results  Component Value Date   ALT 21 12/05/2020   AST 14 12/05/2020   ALKPHOS 88 12/05/2020   BILITOT 0.3 12/05/2020   Lab Results  Component Value Date   HGBA1C 6.2 (H) 12/05/2020   HGBA1C 6.2 (H) 04/04/2020   HGBA1C 6.0 (H) 09/08/2019   HGBA1C 6.2 (H) 02/04/2019   HGBA1C 5.9 (H) 08/06/2018   Lab Results  Component Value Date   INSULIN 13.5 12/05/2020   INSULIN 22.9 04/04/2020   INSULIN 12.6 09/08/2019   INSULIN 23.8 02/04/2019   INSULIN 20.6 08/06/2018   Lab Results  Component Value Date   TSH 2.200 12/05/2020   Lab Results  Component Value Date   CHOL 172 12/05/2020   HDL 51 12/05/2020   LDLCALC 83 12/05/2020   TRIG 229 (H) 12/05/2020   CHOLHDL 3.9 04/04/2020   Lab Results  Component Value Date   VD25OH 60.1 12/05/2020   VD25OH 41.2 04/04/2020   VD25OH 42.5 09/08/2019   Lab Results  Component Value Date   WBC 7.1 04/15/2019   HGB 13.4 04/15/2019   HCT 41.4 04/15/2019   MCV 83.8 04/15/2019   PLT 297 04/15/2019   No results found for: IRON, TIBC, FERRITIN  Attestation Statements:   Reviewed by clinician on day of visit: allergies, medications, problem list, medical history, surgical history, family history, social history, and previous encounter notes.   I, Trixie Dredge, am acting as transcriptionist for Dennard Nip, MD.  I have reviewed the above documentation for accuracy and completeness, and I agree with the above. -  Dennard Nip, MD

## 2021-08-30 ENCOUNTER — Other Ambulatory Visit (HOSPITAL_COMMUNITY): Payer: Self-pay

## 2021-09-03 ENCOUNTER — Telehealth: Payer: Self-pay | Admitting: Adult Health

## 2021-09-03 NOTE — Telephone Encounter (Signed)
Called patient to update her on the changes made to her appointment. Patient is aware of the changes. ?

## 2021-09-06 ENCOUNTER — Other Ambulatory Visit: Payer: Self-pay | Admitting: Physician Assistant

## 2021-09-12 ENCOUNTER — Inpatient Hospital Stay: Payer: 59 | Admitting: Hematology and Oncology

## 2021-09-12 ENCOUNTER — Other Ambulatory Visit (HOSPITAL_COMMUNITY): Payer: Self-pay

## 2021-09-12 DIAGNOSIS — F3341 Major depressive disorder, recurrent, in partial remission: Secondary | ICD-10-CM | POA: Diagnosis not present

## 2021-09-12 DIAGNOSIS — E785 Hyperlipidemia, unspecified: Secondary | ICD-10-CM | POA: Diagnosis not present

## 2021-09-12 DIAGNOSIS — F419 Anxiety disorder, unspecified: Secondary | ICD-10-CM | POA: Diagnosis not present

## 2021-09-12 DIAGNOSIS — I10 Essential (primary) hypertension: Secondary | ICD-10-CM | POA: Diagnosis not present

## 2021-09-12 DIAGNOSIS — C50411 Malignant neoplasm of upper-outer quadrant of right female breast: Secondary | ICD-10-CM | POA: Diagnosis not present

## 2021-09-12 DIAGNOSIS — G2581 Restless legs syndrome: Secondary | ICD-10-CM | POA: Diagnosis not present

## 2021-09-12 DIAGNOSIS — K219 Gastro-esophageal reflux disease without esophagitis: Secondary | ICD-10-CM | POA: Diagnosis not present

## 2021-09-12 MED ORDER — ROPINIROLE HCL 1 MG PO TABS
1.0000 mg | ORAL_TABLET | ORAL | 3 refills | Status: DC
  Filled 2021-09-12: qty 90, 90d supply, fill #0

## 2021-09-12 MED ORDER — ROSUVASTATIN CALCIUM 10 MG PO TABS
10.0000 mg | ORAL_TABLET | Freq: Every evening | ORAL | 3 refills | Status: DC
  Filled 2021-09-12: qty 90, 90d supply, fill #0
  Filled 2021-12-11: qty 90, 90d supply, fill #1
  Filled 2022-03-14: qty 90, 90d supply, fill #2

## 2021-09-12 MED ORDER — ESOMEPRAZOLE MAGNESIUM 40 MG PO CPDR
40.0000 mg | DELAYED_RELEASE_CAPSULE | Freq: Every day | ORAL | 3 refills | Status: DC
  Filled 2021-09-12: qty 90, 90d supply, fill #0

## 2021-09-12 MED ORDER — BUPROPION HCL ER (XL) 300 MG PO TB24
300.0000 mg | ORAL_TABLET | Freq: Every morning | ORAL | 1 refills | Status: DC
  Filled 2021-09-12: qty 90, 90d supply, fill #0

## 2021-09-12 MED ORDER — FLUOXETINE HCL 10 MG PO CAPS
10.0000 mg | ORAL_CAPSULE | Freq: Every day | ORAL | 1 refills | Status: DC
  Filled 2021-09-12 – 2021-12-11 (×2): qty 90, 90d supply, fill #0

## 2021-09-12 MED ORDER — TRIAMTERENE-HCTZ 37.5-25 MG PO TABS
1.0000 | ORAL_TABLET | Freq: Every day | ORAL | 1 refills | Status: DC
  Filled 2021-09-12: qty 90, 90d supply, fill #0
  Filled 2022-01-16: qty 90, 90d supply, fill #1

## 2021-09-13 ENCOUNTER — Encounter (HOSPITAL_BASED_OUTPATIENT_CLINIC_OR_DEPARTMENT_OTHER): Payer: Self-pay | Admitting: Orthopaedic Surgery

## 2021-09-13 ENCOUNTER — Other Ambulatory Visit: Payer: Self-pay

## 2021-09-14 ENCOUNTER — Other Ambulatory Visit (HOSPITAL_COMMUNITY): Payer: Self-pay

## 2021-09-18 ENCOUNTER — Other Ambulatory Visit: Payer: Self-pay

## 2021-09-18 ENCOUNTER — Ambulatory Visit (INDEPENDENT_AMBULATORY_CARE_PROVIDER_SITE_OTHER): Payer: 59 | Admitting: Family Medicine

## 2021-09-18 ENCOUNTER — Encounter (INDEPENDENT_AMBULATORY_CARE_PROVIDER_SITE_OTHER): Payer: Self-pay | Admitting: Family Medicine

## 2021-09-18 ENCOUNTER — Other Ambulatory Visit (HOSPITAL_COMMUNITY): Payer: Self-pay

## 2021-09-18 VITALS — BP 143/81 | HR 86 | Temp 98.5°F | Ht 67.0 in | Wt 244.0 lb

## 2021-09-18 DIAGNOSIS — I10 Essential (primary) hypertension: Secondary | ICD-10-CM

## 2021-09-18 DIAGNOSIS — E559 Vitamin D deficiency, unspecified: Secondary | ICD-10-CM

## 2021-09-18 DIAGNOSIS — Z7985 Long-term (current) use of injectable non-insulin antidiabetic drugs: Secondary | ICD-10-CM | POA: Diagnosis not present

## 2021-09-18 DIAGNOSIS — E1169 Type 2 diabetes mellitus with other specified complication: Secondary | ICD-10-CM

## 2021-09-18 DIAGNOSIS — E669 Obesity, unspecified: Secondary | ICD-10-CM

## 2021-09-18 DIAGNOSIS — E785 Hyperlipidemia, unspecified: Secondary | ICD-10-CM

## 2021-09-18 DIAGNOSIS — Z9189 Other specified personal risk factors, not elsewhere classified: Secondary | ICD-10-CM

## 2021-09-18 DIAGNOSIS — Z6838 Body mass index (BMI) 38.0-38.9, adult: Secondary | ICD-10-CM

## 2021-09-18 MED ORDER — SEMAGLUTIDE-WEIGHT MANAGEMENT 0.25 MG/0.5ML ~~LOC~~ SOAJ
0.2500 mg | SUBCUTANEOUS | 0 refills | Status: DC
  Filled 2021-09-18: qty 2, 28d supply, fill #0

## 2021-09-18 MED ORDER — METFORMIN HCL 500 MG PO TABS
500.0000 mg | ORAL_TABLET | Freq: Two times a day (BID) | ORAL | 0 refills | Status: DC
  Filled 2021-09-18 – 2021-10-03 (×2): qty 60, 30d supply, fill #0

## 2021-09-19 ENCOUNTER — Other Ambulatory Visit (HOSPITAL_COMMUNITY): Payer: Self-pay

## 2021-09-19 ENCOUNTER — Encounter (HOSPITAL_BASED_OUTPATIENT_CLINIC_OR_DEPARTMENT_OTHER)
Admission: RE | Admit: 2021-09-19 | Discharge: 2021-09-19 | Disposition: A | Discharge status: Home / Self Care | Payer: 59 | Source: Ambulatory Visit | Attending: Orthopaedic Surgery | Admitting: Orthopaedic Surgery

## 2021-09-19 ENCOUNTER — Inpatient Hospital Stay: Payer: 59 | Attending: Adult Health | Admitting: Adult Health

## 2021-09-19 ENCOUNTER — Encounter: Payer: Self-pay | Admitting: Adult Health

## 2021-09-19 VITALS — BP 144/64 | HR 90 | Temp 97.5°F | Resp 18 | Ht 67.0 in | Wt 247.7 lb

## 2021-09-19 DIAGNOSIS — Z8249 Family history of ischemic heart disease and other diseases of the circulatory system: Secondary | ICD-10-CM | POA: Diagnosis not present

## 2021-09-19 DIAGNOSIS — Z01818 Encounter for other preprocedural examination: Secondary | ICD-10-CM | POA: Insufficient documentation

## 2021-09-19 DIAGNOSIS — K219 Gastro-esophageal reflux disease without esophagitis: Secondary | ICD-10-CM | POA: Diagnosis not present

## 2021-09-19 DIAGNOSIS — Z823 Family history of stroke: Secondary | ICD-10-CM | POA: Diagnosis not present

## 2021-09-19 DIAGNOSIS — E669 Obesity, unspecified: Secondary | ICD-10-CM | POA: Diagnosis not present

## 2021-09-19 DIAGNOSIS — Z9049 Acquired absence of other specified parts of digestive tract: Secondary | ICD-10-CM | POA: Diagnosis not present

## 2021-09-19 DIAGNOSIS — I10 Essential (primary) hypertension: Secondary | ICD-10-CM | POA: Diagnosis not present

## 2021-09-19 DIAGNOSIS — Z79899 Other long term (current) drug therapy: Secondary | ICD-10-CM | POA: Insufficient documentation

## 2021-09-19 DIAGNOSIS — Z803 Family history of malignant neoplasm of breast: Secondary | ICD-10-CM | POA: Insufficient documentation

## 2021-09-19 DIAGNOSIS — F419 Anxiety disorder, unspecified: Secondary | ICD-10-CM | POA: Diagnosis not present

## 2021-09-19 DIAGNOSIS — C50411 Malignant neoplasm of upper-outer quadrant of right female breast: Secondary | ICD-10-CM | POA: Diagnosis not present

## 2021-09-19 DIAGNOSIS — G4730 Sleep apnea, unspecified: Secondary | ICD-10-CM | POA: Diagnosis not present

## 2021-09-19 DIAGNOSIS — Z836 Family history of other diseases of the respiratory system: Secondary | ICD-10-CM | POA: Diagnosis not present

## 2021-09-19 DIAGNOSIS — Z853 Personal history of malignant neoplasm of breast: Secondary | ICD-10-CM | POA: Diagnosis not present

## 2021-09-19 DIAGNOSIS — Z79811 Long term (current) use of aromatase inhibitors: Secondary | ICD-10-CM | POA: Insufficient documentation

## 2021-09-19 DIAGNOSIS — F32A Depression, unspecified: Secondary | ICD-10-CM | POA: Diagnosis not present

## 2021-09-19 DIAGNOSIS — Z811 Family history of alcohol abuse and dependence: Secondary | ICD-10-CM | POA: Diagnosis not present

## 2021-09-19 DIAGNOSIS — I2510 Atherosclerotic heart disease of native coronary artery without angina pectoris: Secondary | ICD-10-CM | POA: Diagnosis not present

## 2021-09-19 DIAGNOSIS — Z170 Estrogen receptor positive status [ER+]: Secondary | ICD-10-CM

## 2021-09-19 DIAGNOSIS — Z6838 Body mass index (BMI) 38.0-38.9, adult: Secondary | ICD-10-CM | POA: Diagnosis not present

## 2021-09-19 DIAGNOSIS — E119 Type 2 diabetes mellitus without complications: Secondary | ICD-10-CM | POA: Diagnosis not present

## 2021-09-19 DIAGNOSIS — Z818 Family history of other mental and behavioral disorders: Secondary | ICD-10-CM | POA: Diagnosis not present

## 2021-09-19 DIAGNOSIS — Z806 Family history of leukemia: Secondary | ICD-10-CM | POA: Diagnosis not present

## 2021-09-19 DIAGNOSIS — Z6839 Body mass index (BMI) 39.0-39.9, adult: Secondary | ICD-10-CM | POA: Diagnosis not present

## 2021-09-19 DIAGNOSIS — Z923 Personal history of irradiation: Secondary | ICD-10-CM | POA: Insufficient documentation

## 2021-09-19 DIAGNOSIS — M65331 Trigger finger, right middle finger: Secondary | ICD-10-CM | POA: Diagnosis not present

## 2021-09-19 DIAGNOSIS — Z7984 Long term (current) use of oral hypoglycemic drugs: Secondary | ICD-10-CM | POA: Diagnosis not present

## 2021-09-19 DIAGNOSIS — Z8719 Personal history of other diseases of the digestive system: Secondary | ICD-10-CM | POA: Insufficient documentation

## 2021-09-19 LAB — VITAMIN D 25 HYDROXY (VIT D DEFICIENCY, FRACTURES): Vit D, 25-Hydroxy: 71 ng/mL (ref 30.0–100.0)

## 2021-09-19 LAB — BASIC METABOLIC PANEL
Anion gap: 7 (ref 5–15)
BUN: 13 mg/dL (ref 6–20)
CO2: 31 mmol/L (ref 22–32)
Calcium: 9.7 mg/dL (ref 8.9–10.3)
Chloride: 103 mmol/L (ref 98–111)
Creatinine, Ser: 0.88 mg/dL (ref 0.44–1.00)
GFR, Estimated: >60 mL/min (ref >60)
Glucose, Bld: 109 mg/dL — ABNORMAL HIGH (ref 70–99)
Potassium: 3.8 mmol/L (ref 3.5–5.1)
Sodium: 141 mmol/L (ref 135–145)

## 2021-09-19 LAB — LIPID PANEL WITH LDL/HDL RATIO
Cholesterol, Total: 145 mg/dL (ref 100–199)
HDL: 56 mg/dL (ref >39)
LDL Chol Calc (NIH): 65 mg/dL (ref 0–99)
LDL/HDL Ratio: 1.2 ratio (ref 0.0–3.2)
Triglycerides: 141 mg/dL (ref 0–149)
VLDL Cholesterol Cal: 24 mg/dL (ref 5–40)

## 2021-09-19 LAB — CMP14+EGFR
ALT: 19 IU/L (ref 0–32)
AST: 13 IU/L (ref 0–40)
Albumin/Globulin Ratio: 1.9 (ref 1.2–2.2)
Albumin: 4.3 g/dL (ref 3.8–4.9)
Alkaline Phosphatase: 88 IU/L (ref 44–121)
BUN/Creatinine Ratio: 16 (ref 12–28)
BUN: 13 mg/dL (ref 8–27)
Bilirubin Total: 0.2 mg/dL (ref 0.0–1.2)
CO2: 26 mmol/L (ref 20–29)
Calcium: 9.7 mg/dL (ref 8.7–10.3)
Chloride: 104 mmol/L (ref 96–106)
Creatinine, Ser: 0.8 mg/dL (ref 0.57–1.00)
Globulin, Total: 2.3 g/dL (ref 1.5–4.5)
Glucose: 96 mg/dL (ref 70–99)
Potassium: 4.3 mmol/L (ref 3.5–5.2)
Sodium: 142 mmol/L (ref 134–144)
Total Protein: 6.6 g/dL (ref 6.0–8.5)
eGFR: 84 mL/min/{1.73_m2} (ref >59)

## 2021-09-19 LAB — INSULIN, RANDOM: INSULIN: 18.8 u[IU]/mL (ref 2.6–24.9)

## 2021-09-19 LAB — VITAMIN B12: Vitamin B-12: 1081 pg/mL (ref 232–1245)

## 2021-09-19 LAB — HEMOGLOBIN A1C
Est. average glucose Bld gHb Est-mCnc: 134 mg/dL
Hgb A1c MFr Bld: 6.3 % — ABNORMAL HIGH (ref 4.8–5.6)

## 2021-09-19 NOTE — Anesthesia Preprocedure Evaluation (Addendum)
Anesthesia Evaluation  ?Patient identified by MRN, date of birth, ID band ?Patient awake ? ? ? ?Reviewed: ?Allergy & Precautions, NPO status , Patient's Chart, lab work & pertinent test results ? ?History of Anesthesia Complications ?Negative for: history of anesthetic complications ? ?Airway ?Mallampati: II ? ?TM Distance: >3 FB ?Neck ROM: Full ? ? ? Dental ?no notable dental hx. ?(+) Teeth Intact, Dental Advisory Given ?  ?Pulmonary ?sleep apnea ,  ?  ?Pulmonary exam normal ?breath sounds clear to auscultation ? ? ? ? ? ? Cardiovascular ?hypertension, Pt. on medications ?+ CAD  ?Normal cardiovascular exam ?Rhythm:Regular Rate:Normal ? ?Coronary CT 11/22 ?IMPRESSION: ?1. Minimal nonobstructive CAD, CADRADS = 1. There is trivial calcium ?seen in the proximal LAD but no significant stenosis ? ?2. Coronary calcium score of 2. This was 65th percentile for age and ?sex matched control. ? ?3. Normal coronary origin with right dominance. ?  ?Neuro/Psych ?PSYCHIATRIC DISORDERS Anxiety Depression  Neuromuscular disease   ? GI/Hepatic ?Neg liver ROS, GERD  Medicated and Controlled,  ?Endo/Other  ?diabetes, Type 2Morbid obesity ? Renal/GU ?negative Renal ROS  ?negative genitourinary ?  ?Musculoskeletal ?negative musculoskeletal ROS ?(+)  ? Abdominal ?  ?Peds ?negative pediatric ROS ?(+)  Hematology ?negative hematology ROS ?(+)   ?Anesthesia Other Findings ? ? Reproductive/Obstetrics ?negative OB ROS ? ?  ? ? ? ? ? ? ? ? ? ? ? ? ? ?  ?  ? ? ? ? ? ? ? ?Anesthesia Physical ? ?Anesthesia Plan ? ?ASA: 2 ? ?Anesthesia Plan: MAC  ? ?Post-op Pain Management: Minimal or no pain anticipated  ? ?Induction: Intravenous ? ?PONV Risk Score and Plan: 2 ? ?Airway Management Planned: Natural Airway, Nasal Cannula and Simple Face Mask ? ?Additional Equipment: None ? ?Intra-op Plan:  ? ?Post-operative Plan: Extubation in OR ? ?Informed Consent: I have reviewed the patients History and Physical, chart, labs and  discussed the procedure including the risks, benefits and alternatives for the proposed anesthesia with the patient or authorized representative who has indicated his/her understanding and acceptance.  ? ? ? ?Dental advisory given ? ?Plan Discussed with: CRNA, Anesthesiologist and Surgeon ? ?Anesthesia Plan Comments:   ? ? ? ? ? ? ?Anesthesia Quick Evaluation ? ?

## 2021-09-19 NOTE — Progress Notes (Signed)

## 2021-09-19 NOTE — Progress Notes (Signed)
[facility] Follow up: ?  ? ?Candace Stains, MD ?[address] ?[place] [address] ? ? ?DIAGNOSIS:  Cancer Staging  ?Malignant neoplasm of upper-outer quadrant of right breast in female, estrogen receptor positive ([place]) ?Staging form: Breast, AJCC 7th Edition ?- Pathologic: Stage IA (T1b, N0, cM0) - Unsigned ? ? ?SUMMARY OF ONCOLOGIC HISTORY: ?Oncology History  ?Malignant neoplasm of upper-outer quadrant of right breast in female, estrogen receptor positive ([place])  ?04/05/2016 Initial Biopsy  ? Right breast needle core biopsy (10 o'clock): ILC, ER+(90%), PR+(5%), Ki-67 20%, HER-2 negative (ratio 0.82).  ?  ?04/23/2016 Genetic Testing  ? Genetic testing was normal and did not reveal a deleterious mutation, or variant of uncertain significance.  Genes tested include: ATM, BARD1, BRCA1, BRCA2, BRIP1, CDH1, CHEK2, FANCC, MLH1, MSH2, MSH6, NBN, PALB2, PMS2, PTEN, RAD51C, RAD51D, TP53, and XRCC2.  This panel also includes deletion/duplication analysis (without sequencing) for one gene, EPCAM.   ?  ?04/23/2016 Oncotype testing  ? Score: 22, 14% ?  ?05/02/2016 Initial Diagnosis  ? Malignant neoplasm of upper-outer quadrant of right breast in female, estrogen receptor positive ([place]) ?  ?05/28/2016 Surgery  ? Right breast lumpectomy [facility]): ILC, grade 2, 1cm, 4 SLN negative, T1b, N0 ?  ?07/22/2016 - 09/04/2016 Radiation Therapy  ? Adjuvant radiation [facility]): 1) Right breast/ 50.4 Gy in 28 fractions.  2) Right breast boost/ 10 Gy in 5 fractions ?  ?09/2016 -  Anti-estrogen oral therapy  ? Anastrozole 62m daily ?  ? ? ?CURRENT THERAPY: Anastrozole daily ? ?INTERVAL HISTORY: ?Candace LYCAN69y.o. female returns for evaluation of her history of invasive lobular carcinoma in her right breast.  She continues on anastrozole with good tolerance.  She denies joint aches and pains, vaginal dryness, hot flashes.  Her most recent bone density in 2020 was normal. ? ?Her most recent mammogram was  completed October 25, 2020 and showed no evidence of malignancy and breast density category B.  She notes that over the past year she has experienced some right breast shrinkage. ? ?She has been following up with her primary care provider regularly.  She is also seeing healthy weight and wellness and working on exercise with a program at the [facility]  Overall she feels well and has no other current concerns. ? ? ?Patient Active Problem List  ? Diagnosis Date Noted  ? Non-obstructive CAD   ? GERD (gastroesophageal reflux disease)   ? Type 2 diabetes mellitus ([place]   ? Hyperlipidemia   ? Pre-diabetes 02/06/2021  ? Hypertension 02/06/2021  ? Class 2 severe obesity with serious comorbidity and body mass index (BMI) of 38.0 to 38.9 in adult ([facility] 02/06/2021  ? Genetic testing 05/13/2016  ? Malignant neoplasm of upper-outer quadrant of right breast in female, estrogen receptor positive ([place] 05/02/2016  ? Family history of breast cancer in sister 04/23/2016  ? Depression   ? Endometrial hyperplasia   ? Oligomenorrhea   ? Insomnia   ? H/O hemorrhoids   ? Epidermal cyst   ? 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 ? ?has No Known Allergies. ? ?MEDICAL HISTORY: ?Past Medical History:  ?Diagnosis Date  ? Abnormal Pap smear   ? Alcohol abuse   ? Anovulation   ? Anxiety   ? Breast cancer ([place] 04/05/2016  ? right breast  ? Complication of anesthesia   ? slow to wake up  ? Depression   ? Endometrial hyperplasia   ? Endometrial polyp   ?  h/o  ? Epidermal cyst   ? h/o  ? Gallbladder problem   ? GERD (gastroesophageal reflux disease)   ? H/O hemorrhoids   ? H/O menorrhagia   ? 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 Hyperlipidemia   ? Hypertension   ? Insomnia   ? Non-obstructive CAD   ? minimal non-obstructive CAD noted on coronary CTA in 05/2021  ? Obesity   ? Oligomenorrhea   ? Prediabetes   ? Restless legs syndrome   ? Seasonal allergies   ? Type 2 diabetes mellitus ([place])   ? Vitamin D deficiency   ? ? ?SURGICAL HISTORY: ?Past Surgical History:  ?Procedure  Laterality Date  ? BREAST LUMPECTOMY Right 05/2016  ? radiation  ? BREAST LUMPECTOMY WITH RADIOACTIVE SEED AND SENTINEL LYMPH NODE BIOPSY Right 05/28/2016  ? Procedure: RADIOACTIVE SEED GUIDED RIGHT BREAST LUMPECTOMY WITH  RIGHT AXILLARY SENTINEL LYMPH NODE BIOPSY;  Surgeon: [name], MD;  Location: [facility];  Service: General;  Laterality: Right;  ? CARPAL TUNNEL RELEASE  03/26/2011  ? right hand  ? CARPAL TUNNEL RELEASE  05/28/2011  ? Procedure: CARPAL TUNNEL RELEASE;  Surgeon: [name];  Location: [facility];  Service: Orthopedics;  Laterality: Left;  ? CHOLECYSTECTOMY N/A 01/24/2013  ? Procedure: LAPAROSCOPIC CHOLECYSTECTOMY WITH INTRAOPERATIVE CHOLANGIOGRAM;  Surgeon: [name], MD;  Location: [facility];  Service: General;  Laterality: N/A;  ? COLONOSCOPY    ? HYSTEROSCOPY    ? POLYPECTOMY    ? TONSILLECTOMY  1967  ? as child  ? trigger fingers  8 yrs. ago  ? both done months apart  ? uterine ablation  06/04/2010  ? uterine ablation    ? ? ?SOCIAL HISTORY: ?Social History  ? ?Socioeconomic History  ? Marital status: Divorced  ?  Spouse name: Not on file  ? Number of children: 2  ? Years of education: Not on file  ? Highest education level: Not on file  ?Occupational History  ? Not on file  ?Tobacco Use  ? Smoking status: Never  ? Smokeless tobacco: Never  ?Vaping Use  ? Vaping Use: Never used  ?Substance and Sexual Activity  ? Alcohol use: No  ?  Comment: none since 1996  ? Drug use: No  ? Sexual activity: Not Currently  ?  Birth control/protection: None  ?Other Topics Concern  ? Not on file  ?Social History Narrative  ? Not on file  ? ?Social Determinants of Health  ? ?Financial Resource Strain: Not on file  ?Food Insecurity: Not on file  ?Transportation Needs: Not on file  ?Physical Activity: Not on file  ?Stress: Not on file  ?Social Connections: Not on file  ?Intimate Partner Violence: Not on file  ? ? ?FAMILY HISTORY: ?Family History  ?Problem Relation Age of Onset  ? Heart disease Mother    ? Hypertension Mother   ? Stroke Mother   ? Dementia Mother   ?     vascular dementia  ? Hyperlipidemia Mother   ? Depression Mother   ? Anxiety disorder Mother   ? Alcohol abuse Mother   ? Obesity Mother   ? Pulmonary fibrosis Father   ?     d. 71  ? Depression Father   ? Anxiety disorder Father   ? Alcohol abuse Father   ? Heart attack Maternal Grandmother   ?     d. 63-65y  ? Heart disease Maternal Grandmother   ? Heart disease Maternal Grandfather   ?  d. 60-63  ? Leukemia Paternal Grandmother   ?     d. late 39s  ? Breast cancer Sister 23  ?     IDC s/p mastectomy and chemo  ? ? ?Review of Systems  ?Constitutional:  Negative for appetite change, chills, fatigue, fever and unexpected weight change.  ?HENT:   Negative for hearing loss, lump/mass and trouble swallowing.   ?Eyes:  Negative for eye problems and icterus.  ?Respiratory:  Negative for chest tightness, cough and shortness of breath.   ?Cardiovascular:  Negative for chest pain, leg swelling and palpitations.  ?Gastrointestinal:  Negative for abdominal distention, abdominal pain, constipation, diarrhea, nausea and vomiting.  ?Endocrine: Negative for hot flashes.  ?Genitourinary:  Negative for difficulty urinating.   ?Musculoskeletal:  Negative for arthralgias.  ?Skin:  Negative for itching and rash.  ?Neurological:  Negative for dizziness, extremity weakness, headaches and numbness.  ?Hematological:  Negative for adenopathy. Does not bruise/bleed easily.  ?Psychiatric/Behavioral:  Negative for depression. The patient is not nervous/anxious.    ? ? ?PHYSICAL EXAMINATION ? ?ECOG PERFORMANCE STATUS: 1 - Symptomatic but completely ambulatory ? ?Vitals:  ? 09/19/21 0832  ?BP: (!) 144/64  ?Pulse: 90  ?Resp: 18  ?Temp: (!) 97.5 ?F (36.4 ?C)  ?SpO2: 99%  ? ? ?Physical Exam ?Constitutional:   ?   General: She is not in acute distress. ?   Appearance: Normal appearance. She is not toxic-appearing.  ?HENT:  ?   Head: Normocephalic and atraumatic.  ?Eyes:  ?    General: No scleral icterus. ?Cardiovascular:  ?   Rate and Rhythm: Normal rate and regular rhythm.  ?   Pulses: Normal pulses.  ?   Heart sounds: Normal heart sounds.  ?Pulmonary:  ?   Effort: Pulmonary eff

## 2021-09-19 NOTE — Assessment & Plan Note (Signed)
Candace [name] is a 61 year old woman with history of stage Ia invasive lobular carcinoma ER/PR positive, HER2 negative, status postlumpectomy, adjuvant radiation, started on anastrozole in April 2018. ? ?1.  Right-sided breast cancer: She will continue with taking anastrozole daily.  We discussed how some women get a benefit from taking this for 7 years.  Since she is tolerating this quite well she will continue this for 2 more years. ? ?2.  Right breast shrinkage: I have ordered a bilateral diagnostic mammogram and right-sided ultrasound to further evaluate since she has noticed change in symmetry.  This is obvious on physical exam. ? ?3.  Health maintenance: We discussed the recommendation to continue follow-up with primary care and staying up-to-date with her health maintenance.  I reviewed with her the need for healthy diet and exercise which she is doing. ? ?We will see [name] back in 1 year for continued follow-up and surveillance. ?

## 2021-09-20 ENCOUNTER — Ambulatory Visit (HOSPITAL_BASED_OUTPATIENT_CLINIC_OR_DEPARTMENT_OTHER): Payer: 59 | Admitting: Anesthesiology

## 2021-09-20 ENCOUNTER — Encounter (HOSPITAL_BASED_OUTPATIENT_CLINIC_OR_DEPARTMENT_OTHER)
Admission: RE | Disposition: A | Discharge status: Home / Self Care | Payer: Self-pay | Source: Home / Self Care | Attending: Orthopaedic Surgery

## 2021-09-20 ENCOUNTER — Ambulatory Visit (HOSPITAL_BASED_OUTPATIENT_CLINIC_OR_DEPARTMENT_OTHER)
Admission: RE | Admit: 2021-09-20 | Discharge: 2021-09-20 | Disposition: A | Discharge status: Home / Self Care | Payer: 59 | Attending: Orthopaedic Surgery | Admitting: Orthopaedic Surgery

## 2021-09-20 ENCOUNTER — Encounter (HOSPITAL_BASED_OUTPATIENT_CLINIC_OR_DEPARTMENT_OTHER): Payer: Self-pay | Admitting: Orthopaedic Surgery

## 2021-09-20 ENCOUNTER — Other Ambulatory Visit: Payer: Self-pay

## 2021-09-20 DIAGNOSIS — I2510 Atherosclerotic heart disease of native coronary artery without angina pectoris: Secondary | ICD-10-CM | POA: Insufficient documentation

## 2021-09-20 DIAGNOSIS — I10 Essential (primary) hypertension: Secondary | ICD-10-CM | POA: Diagnosis not present

## 2021-09-20 DIAGNOSIS — E669 Obesity, unspecified: Secondary | ICD-10-CM | POA: Insufficient documentation

## 2021-09-20 DIAGNOSIS — Z79899 Other long term (current) drug therapy: Secondary | ICD-10-CM | POA: Insufficient documentation

## 2021-09-20 DIAGNOSIS — M65331 Trigger finger, right middle finger: Secondary | ICD-10-CM

## 2021-09-20 DIAGNOSIS — Z7984 Long term (current) use of oral hypoglycemic drugs: Secondary | ICD-10-CM | POA: Insufficient documentation

## 2021-09-20 DIAGNOSIS — E119 Type 2 diabetes mellitus without complications: Secondary | ICD-10-CM | POA: Insufficient documentation

## 2021-09-20 DIAGNOSIS — K219 Gastro-esophageal reflux disease without esophagitis: Secondary | ICD-10-CM | POA: Diagnosis not present

## 2021-09-20 DIAGNOSIS — G4730 Sleep apnea, unspecified: Secondary | ICD-10-CM

## 2021-09-20 DIAGNOSIS — F32A Depression, unspecified: Secondary | ICD-10-CM | POA: Insufficient documentation

## 2021-09-20 DIAGNOSIS — F418 Other specified anxiety disorders: Secondary | ICD-10-CM | POA: Diagnosis not present

## 2021-09-20 DIAGNOSIS — Z6839 Body mass index (BMI) 39.0-39.9, adult: Secondary | ICD-10-CM | POA: Insufficient documentation

## 2021-09-20 DIAGNOSIS — F419 Anxiety disorder, unspecified: Secondary | ICD-10-CM | POA: Insufficient documentation

## 2021-09-20 DIAGNOSIS — Z853 Personal history of malignant neoplasm of breast: Secondary | ICD-10-CM | POA: Diagnosis not present

## 2021-09-20 HISTORY — PX: TRIGGER FINGER RELEASE: SHX641

## 2021-09-20 HISTORY — DX: Sleep apnea, unspecified: G47.30

## 2021-09-20 LAB — GLUCOSE, CAPILLARY
Glucose-Capillary: 107 mg/dL — ABNORMAL HIGH (ref 70–99)
Glucose-Capillary: 113 mg/dL — ABNORMAL HIGH (ref 70–99)

## 2021-09-20 SURGERY — RELEASE, A1 PULLEY, FOR TRIGGER FINGER
Anesthesia: Monitor Anesthesia Care | Site: Finger | Laterality: Right

## 2021-09-20 MED ORDER — ACETAMINOPHEN 325 MG PO TABS: 325.0000 mg | ORAL_TABLET | ORAL | Status: DC | PRN

## 2021-09-20 MED ORDER — PROPOFOL 10 MG/ML IV BOLUS
INTRAVENOUS | Status: DC | PRN
  Administered 2021-09-20: 200 mg via INTRAVENOUS

## 2021-09-20 MED ORDER — ACETAMINOPHEN 160 MG/5ML PO SOLN: 325.0000 mg | ORAL | Status: DC | PRN

## 2021-09-20 MED ORDER — ONDANSETRON HCL 4 MG/2ML IJ SOLN
INTRAMUSCULAR | Status: AC
  Filled 2021-09-20: qty 2

## 2021-09-20 MED ORDER — FENTANYL CITRATE (PF) 100 MCG/2ML IJ SOLN
INTRAMUSCULAR | Status: DC | PRN
  Administered 2021-09-20 (×2): 50 ug via INTRAVENOUS

## 2021-09-20 MED ORDER — 0.9 % SODIUM CHLORIDE (POUR BTL) OPTIME
TOPICAL | Status: DC | PRN
Start: 2021-09-20 — End: 2021-09-20
  Administered 2021-09-20: 60 mL

## 2021-09-20 MED ORDER — OXYCODONE HCL 5 MG PO TABS: 5.0000 mg | ORAL_TABLET | Freq: Once | ORAL | Status: DC | PRN

## 2021-09-20 MED ORDER — FENTANYL CITRATE (PF) 100 MCG/2ML IJ SOLN
INTRAMUSCULAR | Status: AC
  Filled 2021-09-20: qty 2

## 2021-09-20 MED ORDER — FENTANYL CITRATE (PF) 100 MCG/2ML IJ SOLN: 25.0000 ug | INTRAMUSCULAR | Status: DC | PRN

## 2021-09-20 MED ORDER — MIDAZOLAM HCL 5 MG/5ML IJ SOLN
INTRAMUSCULAR | Status: DC | PRN
Start: 2021-09-20 — End: 2021-09-20
  Administered 2021-09-20: 2 mg via INTRAVENOUS

## 2021-09-20 MED ORDER — LACTATED RINGERS IV SOLN: INTRAVENOUS | Status: DC

## 2021-09-20 MED ORDER — MIDAZOLAM HCL 2 MG/2ML IJ SOLN
INTRAMUSCULAR | Status: AC
  Filled 2021-09-20: qty 2

## 2021-09-20 MED ORDER — PROPOFOL 500 MG/50ML IV EMUL
INTRAVENOUS | Status: DC | PRN
Start: 2021-09-20 — End: 2021-09-20
  Administered 2021-09-20: 125 ug/kg/min via INTRAVENOUS

## 2021-09-20 MED ORDER — MEPERIDINE HCL 25 MG/ML IJ SOLN: 6.2500 mg | INTRAMUSCULAR | Status: DC | PRN

## 2021-09-20 MED ORDER — OXYCODONE HCL 5 MG/5ML PO SOLN: 5.0000 mg | Freq: Once | ORAL | Status: DC | PRN

## 2021-09-20 MED ORDER — CEFAZOLIN SODIUM-DEXTROSE 2-4 GM/100ML-% IV SOLN
INTRAVENOUS | Status: AC
  Filled 2021-09-20: qty 100

## 2021-09-20 MED ORDER — BUPIVACAINE HCL (PF) 0.25 % IJ SOLN
INTRAMUSCULAR | Status: DC | PRN
  Administered 2021-09-20: 4 mL

## 2021-09-20 MED ORDER — ONDANSETRON HCL 4 MG/2ML IJ SOLN: 4.0000 mg | Freq: Once | INTRAMUSCULAR | Status: DC | PRN

## 2021-09-20 MED ORDER — BUPIVACAINE HCL (PF) 0.25 % IJ SOLN
INTRAMUSCULAR | Status: AC
  Filled 2021-09-20: qty 90

## 2021-09-20 MED ORDER — DEXMEDETOMIDINE HCL IN NACL 200 MCG/50ML IV SOLN
INTRAVENOUS | Status: DC | PRN
  Administered 2021-09-20: 16 ug via INTRAVENOUS

## 2021-09-20 MED ORDER — CEFAZOLIN SODIUM-DEXTROSE 2-4 GM/100ML-% IV SOLN
2.0000 g | INTRAVENOUS | Status: AC
Start: 2021-09-20 — End: 2021-09-20
  Administered 2021-09-20: 2 g via INTRAVENOUS

## 2021-09-20 MED ORDER — ONDANSETRON HCL 4 MG/2ML IJ SOLN
INTRAMUSCULAR | Status: DC | PRN
  Administered 2021-09-20: 4 mg via INTRAVENOUS

## 2021-09-20 SURGICAL SUPPLY — 29 items
BLADE SURG 15 STRL LF DISP TIS (BLADE) ×2 IMPLANT
BLADE SURG 15 STRL SS (BLADE) ×4
BNDG CMPR 9X4 STRL LF SNTH (GAUZE/BANDAGES/DRESSINGS) ×1
BNDG ELASTIC 2X5.8 VLCR STR LF (GAUZE/BANDAGES/DRESSINGS) ×1 IMPLANT
BNDG ESMARK 4X9 LF (GAUZE/BANDAGES/DRESSINGS) ×1 IMPLANT
CORD BIPOLAR FORCEPS 12FT (ELECTRODE) ×2 IMPLANT
COVER BACK TABLE 60X90IN (DRAPES) ×2 IMPLANT
COVER MAYO STAND STRL (DRAPES) ×2 IMPLANT
DRAPE EXTREMITY T 121X128X90 (DISPOSABLE) ×2 IMPLANT
DRAPE U-SHAPE 47X51 STRL (DRAPES) ×1 IMPLANT
DURAPREP 26ML APPLICATOR (WOUND CARE) ×2 IMPLANT
GAUZE SPONGE 4X4 12PLY STRL (GAUZE/BANDAGES/DRESSINGS) ×2 IMPLANT
GAUZE XEROFORM 1X8 LF (GAUZE/BANDAGES/DRESSINGS) ×2 IMPLANT
GLOVE SRG 8 PF TXTR STRL LF DI (GLOVE) ×2 IMPLANT
GLOVE SURG ENC MOIS LTX SZ7.5 (GLOVE) ×4 IMPLANT
GLOVE SURG UNDER POLY LF SZ8 (GLOVE) ×4
GOWN STRL REUS W/ TWL LRG LVL3 (GOWN DISPOSABLE) ×1 IMPLANT
GOWN STRL REUS W/TWL LRG LVL3 (GOWN DISPOSABLE) ×4
GOWN STRL REUS W/TWL XL LVL3 (GOWN DISPOSABLE) ×3 IMPLANT
NDL HYPO 25X1 1.5 SAFETY (NEEDLE) IMPLANT
NEEDLE HYPO 25X1 1.5 SAFETY (NEEDLE) ×2 IMPLANT
NS IRRIG 1000ML POUR BTL (IV SOLUTION) ×2 IMPLANT
PACK BASIN DAY SURGERY FS (CUSTOM PROCEDURE TRAY) ×2 IMPLANT
STOCKINETTE 4X48 STRL (DRAPES) ×2 IMPLANT
SUT ETHILON 3 0 PS 1 (SUTURE) ×2 IMPLANT
SYR BULB EAR ULCER 3OZ GRN STR (SYRINGE) ×2 IMPLANT
SYR CONTROL 10ML LL (SYRINGE) ×2 IMPLANT
TOWEL GREEN STERILE FF (TOWEL DISPOSABLE) ×2 IMPLANT
UNDERPAD 30X36 HEAVY ABSORB (UNDERPADS AND DIAPERS) ×2 IMPLANT

## 2021-09-20 NOTE — Transfer of Care (Signed)
Immediate Anesthesia Transfer of Care Note ? ?Patient: Candace [name] ? ?Procedure(s) Performed: RIGHT MIDDLE FINGER A-1 PULLEY RELEASE (Right: Finger) ? ?Patient Location: PACU ? ?Anesthesia Type:General ? ?Level of Consciousness: awake, drowsy and patient cooperative ? ?Airway & Oxygen Therapy: Patient Spontanous Breathing and Patient connected to face mask oxygen ? ?Post-op Assessment: Report given to RN and Post -op Vital signs reviewed and stable ? ?Post vital signs: Reviewed and stable ? ?Last Vitals:  ?Vitals Value Taken Time  ?BP    ?Temp    ?Pulse    ?Resp    ?SpO2    ? ? ?Last Pain:  ?Vitals:  ? 09/20/21 0634  ?TempSrc: Oral  ?PainSc: 0-No pain  ?   ? ?Patients Stated Pain Goal: 4 (09/20/21 7829) ? ?Complications: No notable events documented. ?

## 2021-09-20 NOTE — Op Note (Signed)
NAME: [name], Candace E. ?MEDICAL RECORD NO: [id number] ?ACCOUNT NO: [id number] ?DATE OF BIRTH: 1960-07-22 ?FACILITY: [facility] ?LOCATION: [facility] ?PHYSICIAN: Lind Guest. Ninfa Linden, MD ? ?Operative Report  ? ?DATE OF PROCEDURE: 09/20/2021 ? ? ?PREOPERATIVE DIAGNOSIS:  Right middle finger trigger finger. ? ?POSTOPERATIVE DIAGNOSIS:  Right middle finger trigger finger. ? ?PROCEDURE:  Right middle finger A1 pulley release. ? ?SURGEON:  Lind Guest. Ninfa Linden, MD ? ?ANESTHESIA: ?1.  General via LMA. ?2.  0.25% plain Marcaine local. ? ?ESTIMATED BLOOD LOSS:  Minimal. ? ?ANTIBIOTICS:  2 g IV Ancef. ? ?COMPLICATIONS:  None. ? ?INDICATIONS:  The patient is a 61 year old female well documented pain over the A1 pulley of right middle finger and triggering.  She has tried and failed all forms of conservative treatment.  At this point, operative intervention has been recommended.   ?The risks and benefits of surgery were described in detail and informed consent was obtained. The right middle finger and A1 pulley area has been marked.   ? ?PROCEDURE:  After informed consent was obtained for right hand was marked.  She was brought to the operating room and placed supine on the operating table, the right arm on arm table.  A nonsterile tourniquet was placed around her upper arm and the right ? hand and wrist were prepped and draped with DuraPrep and sterile drapes.  A timeout was called.  She was identified correct patient and correct right arm.  Light mask ventilation IV sedation was given and an Esmarch was used to wrap out the hand and  ?tourniquet was inflated to 250 mm of pressure.  She then really started removing too much for [name] to proceed with the case, so general anesthesia was obtained via LMA.  We did let the tourniquet down to see if that was what was causing her to move quite a ? bit.  Once general anesthesia was obtained via LMA, I then just used an Esmarch around the wrist as a local tourniquet.  We then made an incision  directly over the A1 pulley of the right middle finger.  We dissected down the A1 pulley and divided it  ?from proximal to distal.  Once we were able to divide it, we did find thickened hypertrophic type of tissue.  I then flexed and extended the fingers and watched the flexor tendons glide easily with no further triggering.  We then irrigated the wound with ? normal saline solution.  I closed the incision with interrupted 3-0 nylon suture.  I then anesthetized the area with local 0.25% plain Marcaine.  Xeroform well-padded sterile dressing was applied and we removed the Esmarch around the wrist and the  ?fingers pinked nicely.  She was awakened, extubated, and taken to recovery room in stable condition with all final counts being correct.  There were no complications noted.  ? ? ? ?[place] ?D: 09/20/2021 7:53:34 am T: 09/20/2021 8:05:00 am  ?JOB: [id number]/ [id number]  ?

## 2021-09-20 NOTE — Anesthesia Postprocedure Evaluation (Signed)
Anesthesia Post Note ? ?Patient: [name] ? ?Procedure(s) Performed: RIGHT MIDDLE FINGER A-1 PULLEY RELEASE (Right: Finger) ? ?  ? ?Patient location during evaluation: PACU ?Anesthesia Type: MAC ?Level of consciousness: awake and alert ?Pain management: pain level controlled ?Vital Signs Assessment: post-procedure vital signs reviewed and stable ?Respiratory status: spontaneous breathing, nonlabored ventilation, respiratory function stable and patient connected to nasal cannula oxygen ?Cardiovascular status: stable and blood pressure returned to baseline ?Postop Assessment: no apparent nausea or vomiting ?Anesthetic complications: no ? ? ?No notable events documented. ? ?Last Vitals:  ?Vitals:  ? 09/20/21 0845 09/20/21 0856  ?BP: 127/66 (!) 142/75  ?Pulse: 86 85  ?Resp: 20 16  ?Temp:  36.7 ?C  ?SpO2: 95% 93%  ?  ?Last Pain:  ?Vitals:  ? 09/20/21 0856  ?TempSrc:   ?PainSc: 0-No pain  ? ? ?  ?  ?  ?  ?  ?  ? ?Candace [name] ? ? ? ? ?

## 2021-09-20 NOTE — Anesthesia Procedure Notes (Signed)
Procedure Name: LMA Insertion ?Date/Time: 09/20/2021 7:43 AM ?Performed by: Verita Lamb, CRNA ?Pre-anesthesia Checklist: Patient identified, Emergency Drugs available, Suction available and Patient being monitored ?Patient Re-evaluated:Patient Re-evaluated prior to induction ?Oxygen Delivery Method: Circle system utilized ?Preoxygenation: Pre-oxygenation with 100% oxygen ?Induction Type: IV induction ?Ventilation: Mask ventilation without difficulty ?LMA: LMA inserted ?LMA Size: 4.0 ?Number of attempts: 1 ?Airway Equipment and Method: Bite block ?Placement Confirmation: positive ETCO2, CO2 detector and breath sounds checked- equal and bilateral ?Tube secured with: Tape ?Dental Injury: Teeth and Oropharynx as per pre-operative assessment  ? ? ? ? ?

## 2021-09-20 NOTE — Discharge Instructions (Addendum)
You may use your right hand today as comfort allows. ?Keep your dressing clean and dry for the next 2 days. ?In 2 days from now, you may remove your dressings and get your incision wet daily in the shower. ?Place a Band-Aid over the incision daily to protect the sutures. ? ? ?Post Anesthesia Home Care Instructions ? ?Activity: ?Get plenty of rest for the remainder of the day. A responsible individual must stay with you for 24 hours following the procedure.  ?For the next 24 hours, DO NOT: ?-Drive a car ?-Paediatric nurse ?-Drink alcoholic beverages ?-Take any medication unless instructed by your physician ?-Make any legal decisions or sign important papers. ? ?Meals: ?Start with liquid foods such as gelatin or soup. Progress to regular foods as tolerated. Avoid greasy, spicy, heavy foods. If nausea and/or vomiting occur, drink only clear liquids until the nausea and/or vomiting subsides. Call your physician if vomiting continues. ? ?Special Instructions/Symptoms: ?Your throat may feel dry or sore from the anesthesia or the breathing tube placed in your throat during surgery. If this causes discomfort, gargle with warm salt water. The discomfort should disappear within 24 hours. ? ?If you had a scopolamine patch placed behind your ear for the management of post- operative nausea and/or vomiting: ? ?1. The medication in the patch is effective for 72 hours, after which it should be removed.  Wrap patch in a tissue and discard in the trash. Wash hands thoroughly with soap and water. ?2. You may remove the patch earlier than 72 hours if you experience unpleasant side effects which may include dry mouth, dizziness or visual disturbances. ?3. Avoid touching the patch. Wash your hands with soap and water after contact with the patch. ?    ?

## 2021-09-20 NOTE — H&P (Signed)
Candace [name] is an 61 y.o. female.   ?Chief Complaint: Right middle finger triggering and pain ?HPI: Candace [name] is a 61 year old female with well-documented trigger finger of her right middle finger.  She has tried and failed conservative treatment.  At this point we have recommended an A1 pulley release. ? ?Past Medical History:  ?Diagnosis Date  ? Abnormal Pap smear   ? Alcohol abuse   ? Anovulation   ? Anxiety   ? Breast cancer ([place]) 04/05/2016  ? right breast  ? Depression   ? Endometrial hyperplasia   ? Endometrial polyp   ? h/o  ? Epidermal cyst   ? h/o  ? Gallbladder problem   ? GERD (gastroesophageal reflux disease)   ? H/O hemorrhoids   ? H/O menorrhagia   ? 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 Hyperlipidemia   ? Hypertension   ? Insomnia   ? Non-obstructive CAD   ? minimal non-obstructive CAD noted on coronary CTA in 05/2021  ? Obesity   ? Oligomenorrhea   ? Prediabetes   ? Restless legs syndrome   ? Seasonal allergies   ? Sleep apnea   ? Type 2 diabetes mellitus ([place])   ? Vitamin D deficiency   ? ? ?Past Surgical History:  ?Procedure Laterality Date  ? BREAST LUMPECTOMY Right 05/2016  ? radiation  ? BREAST LUMPECTOMY WITH RADIOACTIVE SEED AND SENTINEL LYMPH NODE BIOPSY Right 05/28/2016  ? Procedure: RADIOACTIVE SEED GUIDED RIGHT BREAST LUMPECTOMY WITH  RIGHT AXILLARY SENTINEL LYMPH NODE BIOPSY;  Surgeon: [name], MD;  Location: [facility];  Service: General;  Laterality: Right;  ? CARPAL TUNNEL RELEASE  03/26/2011  ? right hand  ? CARPAL TUNNEL RELEASE  05/28/2011  ? Procedure: CARPAL TUNNEL RELEASE;  Surgeon: [name];  Location: [facility];  Service: Orthopedics;  Laterality: Left;  ? CHOLECYSTECTOMY N/A 01/24/2013  ? Procedure: LAPAROSCOPIC CHOLECYSTECTOMY WITH INTRAOPERATIVE CHOLANGIOGRAM;  Surgeon: [name], MD;  Location: [place] [facility];  Service: General;  Laterality: N/A;  ? COLONOSCOPY    ? HYSTEROSCOPY    ? POLYPECTOMY    ? TONSILLECTOMY  1967  ? as child  ? trigger fingers  8 yrs. ago  ? both  done months apart  ? uterine ablation  06/04/2010  ? uterine ablation    ? ? ?Family History  ?Problem Relation Age of Onset  ? Heart disease Mother   ? Hypertension Mother   ? Stroke Mother   ? Dementia Mother   ?     vascular dementia  ? Hyperlipidemia Mother   ? Depression Mother   ? Anxiety disorder Mother   ? Alcohol abuse Mother   ? Obesity Mother   ? Pulmonary fibrosis Father   ?     d. 39  ? Depression Father   ? Anxiety disorder Father   ? Alcohol abuse Father   ? Heart attack Maternal Grandmother   ?     d. 63-65y  ? Heart disease Maternal Grandmother   ? Heart disease Maternal Grandfather   ?     d. 60-63  ? Leukemia Paternal Grandmother   ?     d. late 59s  ? Breast cancer Sister 36  ?     IDC s/p mastectomy and chemo  ? ?Social History:  reports that she has never smoked. She has never used smokeless tobacco. She reports that she does not drink alcohol and does not use drugs. ? ?Allergies: No Known Allergies ? ?Medications  Prior to Admission  ?Medication Sig Dispense Refill  ? anastrozole (ARIMIDEX) 1 MG tablet Take 1 tablet (1 mg total) by mouth daily. 90 tablet 6  ? buPROPion (WELLBUTRIN XL) 300 MG 24 hr tablet Take 1 tablet (300 mg total) by mouth every morning. 90 tablet 1  ? cetirizine (ZYRTEC) 10 MG tablet Take 10 mg by mouth daily. [facility]    ? Cholecalciferol (VITAMIN D-3) 125 MCG (5000 UT) TABS Take 5,000 Units by mouth daily. (Patient taking differently: Take 50 mg by mouth daily.) 30 tablet   ? esomeprazole (NEXIUM) 40 MG capsule Take 1 capsule (40 mg total) by mouth daily. 90 capsule 3  ? FLUoxetine (PROZAC) 10 MG capsule Take 1 capsule (10 mg total) by mouth daily. 90 capsule 1  ? losartan (COZAAR) 25 MG tablet Take 1 tablet (25 mg total) by mouth daily. 90 tablet 3  ? metFORMIN (GLUCOPHAGE) 500 MG tablet Take 1 tablet (500 mg total) by mouth 2 (two) times daily with a meal. 60 tablet 0  ? Multiple Vitamin (MULTIVITAMIN WITH MINERALS) TABS tablet Take 1 tablet by  mouth daily.    ? rOPINIRole (REQUIP) 1 MG tablet Take 1 tablet (1 mg total) by mouth 1 to 3 hours before bedtime. 90 tablet 3  ? rosuvastatin (CRESTOR) 10 MG tablet Take 1 tablet (10 mg total) by mouth every evening. 90 tablet 3  ? Semaglutide-Weight Management 0.25 MG/0.5ML SOAJ Inject 0.25 mg into the skin once a week. 2 mL 0  ? triamterene-hydrochlorothiazide (MAXZIDE-25) 37.5-25 MG tablet Take 1 tablet by mouth daily. 90 tablet 1  ? metoprolol tartrate (LOPRESSOR) 100 MG tablet Take 1 tablet (100 mg total) by mouth once for 1 dose. PLEASE TAKE METOPROLOL 2  HOURS PRIOR TO CTA SCAN. 1 tablet 0  ? ? ?Results for orders placed or performed during the hospital encounter of 09/20/21 (from the past 48 hour(s))  ?Basic metabolic panel per protocol     Status: Abnormal  ? Collection Time: 09/19/21  1:19 PM  ?Result Value Ref Range  ? Sodium 141 135 - 145 mmol/L  ? Potassium 3.8 3.5 - 5.1 mmol/L  ? Chloride 103 98 - 111 mmol/L  ? CO2 31 22 - 32 mmol/L  ? Glucose, Bld 109 (H) 70 - 99 mg/dL  ?  Comment: Glucose reference range applies only to samples taken after fasting for at least 8 hours.  ? BUN 13 6 - 20 mg/dL  ? Creatinine, Ser 0.88 0.44 - 1.00 mg/dL  ? Calcium 9.7 8.9 - 10.3 mg/dL  ? GFR, Estimated >60 >60 mL/min  ?  Comment: (NOTE) ?Calculated using the CKD-EPI Creatinine Equation (2021) ?  ? Anion gap 7 5 - 15  ?  Comment: Performed at [facility], [place] [address] [place], [place] [address]  ?Glucose, capillary     Status: Abnormal  ? Collection Time: 09/20/21  6:44 AM  ?Result Value Ref Range  ? Glucose-Capillary 107 (H) 70 - 99 mg/dL  ?  Comment: Glucose reference range applies only to samples taken after fasting for at least 8 hours.  ? ?No results found. ? ?Review of Systems  ?All other systems reviewed and are negative. ? ?Blood pressure (!) 154/89, pulse 96, temperature 98.3 ?F (36.8 ?C), temperature source Oral, resp. rate 18, height '5\' 7"'$  (1.702 m), weight 113.2 kg, SpO2 98 %. ?Physical Exam ?Vitals  reviewed.  ?Constitutional:   ?   Appearance: Normal appearance.  ?HENT:  ?  Head: Normocephalic and atraumatic.  ?Eyes:  ?   Extraocular Movements: Extraocular movements intact.  ?   Pupils: Pupils are equal, round, and reactive to light.  ?Cardiovascular:  ?   Rate and Rhythm: Normal rate and regular rhythm.  ?Pulmonary:  ?   Effort: Pulmonary effort is normal.  ?   Breath sounds: Normal breath sounds.  ?Abdominal:  ?   Palpations: Abdomen is soft.  ?Musculoskeletal:  ?   Right hand: Tenderness present.  ?     Hands: ? ?   Cervical back: Normal range of motion and neck supple.  ?Neurological:  ?   Mental Status: She is alert and oriented to person, place, and time.  ?Psychiatric:     ?   Behavior: Behavior normal.  ?  ? ?Assessment/Plan ?Right middle finger trigger finger ? ?The plan is to proceed to surgery today as an outpatient for a right middle finger A1 pulley release.  The risks and benefits of surgery have been explained in detail and informed consent is obtained.  The right middle finger has been marked. ? ?[name], MD ?09/20/2021, 7:19 AM ? ? ? ?

## 2021-09-20 NOTE — Brief Op Note (Signed)
09/20/2021 ? ?7:55 AM ? ?PATIENT:  Candace [name]  61 y.o. female ? ?PRE-OPERATIVE DIAGNOSIS:  right middle finger trigger finger ? ?POST-OPERATIVE DIAGNOSIS:  right middle finger trigger finger ? ?PROCEDURE:  Procedure(s): ?RIGHT MIDDLE FINGER A-1 PULLEY RELEASE (Right) ? ?SURGEON:  Surgeon(s) and Role: ?   [name], MD - Primary ? ?ANESTHESIA:   local and general ? ?COUNTS:  YES ? ?TOURNIQUET:   ?Total Tourniquet Time Documented: ?Upper Arm (Right) - 2 minutes ?Upper Arm (Right) - 8 minutes ?Total: Upper Arm (Right) - 10 minutes ? ? ?DICTATION: .Other Dictation: Dictation Number [id number] ? ?PLAN OF CARE: Discharge to home after PACU ? ?PATIENT DISPOSITION:  PACU - hemodynamically stable. ?  ?Delay start of Pharmacological VTE agent (>24hrs) due to surgical blood loss or risk of bleeding: no ? ?

## 2021-09-21 ENCOUNTER — Encounter (HOSPITAL_BASED_OUTPATIENT_CLINIC_OR_DEPARTMENT_OTHER): Payer: Self-pay | Admitting: Orthopaedic Surgery

## 2021-09-21 ENCOUNTER — Other Ambulatory Visit (HOSPITAL_COMMUNITY): Payer: Self-pay

## 2021-09-21 NOTE — Progress Notes (Signed)
? ? ? ?Chief Complaint:  ? ?OBESITY ?Shivonne is here to discuss her progress with her obesity treatment plan along with follow-up of her obesity related diagnoses. Cornie is on keeping a food journal and adhering to recommended goals of 1400-1500 calories and 100 grams of protein daily and states she is following her eating plan approximately 70% of the time. Nabria states she is at the [facility] for 50-60 minutes 2 times per week. ? ?Today's visit was #: 34 ?Starting weight: 249 lbs ?Starting date: 09/17/2017 ?Today's weight: 244 lbs ?Today's date: 09/18/2021 ?Total lbs lost to date: 5 ?Total lbs lost since last in-office visit: 3 ? ?Interim History: Perla continues to do well with weight loss. She is journaling and working on increasing her protein. She is on [facility] for 3 doses, and she notes "low grade" nausea, especially if she eats ice cream. This doesn't bother her enough to stop taking it. ? ?Subjective:  ? ?1. Type 2 diabetes mellitus with other specified complication, without long-term current use of insulin ([place]) ?Lianette is on metformin and she is now on a GLP-1. She is working on diet and exercise. She notes some increase in polyuria and polydipsia, but this is likely due to her weight loss and no elevated glucose.  ? ?2. Hyperlipidemia, unspecified hyperlipidemia type ?Zeinab is on a statin and she is due for labs. She is doing well with decreasing cholesterol in her diet.  ? ?3. Vitamin D deficiency ?Dariana is on Vitamin D, and she is due for labs to check her progress.  ? ?4. Essential hypertension ?Delbert's blood pressure is elevated today. She is doing well with weight loss. She is fasting for labs and she didn't take her medications this morning. ? ?5. At risk for heart disease ?Hayle is at higher than average risk for cardiovascular disease due to obesity. ? ?Assessment/Plan:  ? ?1. Type 2 diabetes mellitus with other specified complication, without long-term current use of insulin ([place]) ?We will check  labs today, and we will refill metformin for 1 month. ? ?- metFORMIN (GLUCOPHAGE) 500 MG tablet; Take 1 tablet (500 mg total) by mouth 2 (two) times daily with a meal.  Dispense: 60 tablet; Refill: 0 ?- CMP14+EGFR ?- Insulin, random ?- Hemoglobin A1c ?- Vitamin B12 ? ?2. Hyperlipidemia, unspecified hyperlipidemia type ?Cardiovascular risk and specific lipid/LDL goals reviewed. We will check labs today. Tierrah will continue to work on diet, exercise and weight loss efforts. Orders and follow up as documented in patient record.  ? ?- Lipid Panel With LDL/HDL Ratio ? ?3. Vitamin D deficiency ?We will check labs today. Elliett will follow-up for routine testing of Vitamin D, at least 2-3 times per year to avoid over-replacement. ? ?- VITAMIN D 25 Hydroxy (Vit-D Deficiency, Fractures) ? ?4. Essential hypertension ?We will check labs today. Salle will continue her medications, diet, and exercise. ? ?5. At risk for heart disease ?Lilo was given approximately 15 minutes of coronary artery disease prevention counseling today. She is 61 y.o. female and has risk factors for heart disease including obesity. We discussed intensive lifestyle modifications today with an emphasis on specific weight loss instructions and strategies. ? ?Repetitive spaced learning was employed today to elicit superior memory formation and behavioral change.  ? ?6. Obesity with current BMI 38.2 ?Anuhea is currently in the action stage of change. As such, her goal is to continue with weight loss efforts. She has agreed to keeping a food journal and adhering to recommended goals of 1500 calories  and 90+ grams of protein daily.  ? ?We discussed various medication options to help Tahra with her weight loss efforts and we both agreed to continue Wegovy, and we will refill for 1 month.. ? ?- Semaglutide-Weight Management 0.25 MG/0.5ML SOAJ; Inject 0.25 mg into the skin once a week.  Dispense: 2 mL; Refill: 0 ? ?Exercise goals: As is. ? ?Behavioral  modification strategies: increasing lean protein intake and meal planning and cooking strategies. ? ?[name] has agreed to follow-up with our clinic in 3 weeks. She was informed of the importance of frequent follow-up visits to maximize her success with intensive lifestyle modifications for her multiple health conditions.  ? ?Objective:  ? ?Blood pressure (!) 143/81, pulse 86, temperature 98.5 ?F (36.9 ?C), height '5\' 7"'  (1.702 m), weight 244 lb (110.7 kg), SpO2 98 %. ?Body mass index is 38.22 kg/m?. ? ?General: Cooperative, alert, well developed, in no acute distress. ?HEENT: Conjunctivae and lids unremarkable. ?Cardiovascular: Regular rhythm.  ?Lungs: Normal work of breathing. ?Neurologic: No focal deficits.  ? ?Lab Results  ?Component Value Date  ? CREATININE 0.88 09/19/2021  ? BUN 13 09/19/2021  ? NA 141 09/19/2021  ? K 3.8 09/19/2021  ? CL 103 09/19/2021  ? CO2 31 09/19/2021  ? ?Lab Results  ?Component Value Date  ? ALT 19 09/18/2021  ? AST 13 09/18/2021  ? ALKPHOS 88 09/18/2021  ? BILITOT 0.2 09/18/2021  ? ?Lab Results  ?Component Value Date  ? HGBA1C 6.3 (H) 09/18/2021  ? HGBA1C 6.2 (H) 12/05/2020  ? HGBA1C 6.2 (H) 04/04/2020  ? HGBA1C 6.0 (H) 09/08/2019  ? HGBA1C 6.2 (H) 02/04/2019  ? ?Lab Results  ?Component Value Date  ? INSULIN 18.8 09/18/2021  ? INSULIN 13.5 12/05/2020  ? INSULIN 22.9 04/04/2020  ? INSULIN 12.6 09/08/2019  ? INSULIN 23.8 02/04/2019  ? ?Lab Results  ?Component Value Date  ? TSH 2.200 12/05/2020  ? ?Lab Results  ?Component Value Date  ? CHOL 145 09/18/2021  ? HDL 56 09/18/2021  ? Blanket 65 09/18/2021  ? TRIG 141 09/18/2021  ? CHOLHDL 3.9 04/04/2020  ? ?Lab Results  ?Component Value Date  ? VD25OH 71.0 09/18/2021  ? VD25OH 60.1 12/05/2020  ? VD25OH 41.2 04/04/2020  ? ?Lab Results  ?Component Value Date  ? WBC 7.1 04/15/2019  ? HGB 13.4 04/15/2019  ? HCT 41.4 04/15/2019  ? MCV 83.8 04/15/2019  ? PLT 297 04/15/2019  ? ?No results found for: IRON, TIBC, FERRITIN ? ?Attestation Statements:   ? ?Reviewed by clinician on day of visit: allergies, medications, problem list, medical history, surgical history, family history, social history, and previous encounter notes. ? ? ?I, Trixie Dredge, am acting as transcriptionist for Dennard Nip, MD. ? ?I have reviewed the above documentation for accuracy and completeness, and I agree with the above. -  Dennard Nip, MD ? ? ?

## 2021-10-01 ENCOUNTER — Other Ambulatory Visit (HOSPITAL_COMMUNITY): Payer: Self-pay

## 2021-10-03 ENCOUNTER — Ambulatory Visit (INDEPENDENT_AMBULATORY_CARE_PROVIDER_SITE_OTHER): Payer: 59 | Admitting: Orthopaedic Surgery

## 2021-10-03 ENCOUNTER — Other Ambulatory Visit (HOSPITAL_COMMUNITY): Payer: Self-pay

## 2021-10-03 ENCOUNTER — Encounter: Payer: Self-pay | Admitting: Orthopaedic Surgery

## 2021-10-03 DIAGNOSIS — M65331 Trigger finger, right middle finger: Secondary | ICD-10-CM

## 2021-10-03 NOTE — Progress Notes (Signed)
Candace [name] comes in today for postoperative follow-up status post a right middle finger A1 pulley release.  She is doing well overall. ? ?On exam I did remove the sutures.  The incision actually is pretty good.  I did place some Steri-Strips.  She understands this may dehisce and certainly treating it daily with cleaning it and Vaseline will help it heal from the inside out if it does open up any.  I let her know that I am not worried about it and these things usually heal very well.  She is well-known to me.  She knows that she can call me anytime if there are any issues.  Follow-up can be as needed. ?

## 2021-10-09 ENCOUNTER — Encounter (INDEPENDENT_AMBULATORY_CARE_PROVIDER_SITE_OTHER): Payer: Self-pay | Admitting: Family Medicine

## 2021-10-09 ENCOUNTER — Ambulatory Visit (INDEPENDENT_AMBULATORY_CARE_PROVIDER_SITE_OTHER): Payer: 59 | Admitting: Family Medicine

## 2021-10-09 ENCOUNTER — Other Ambulatory Visit (HOSPITAL_COMMUNITY): Payer: Self-pay

## 2021-10-09 VITALS — BP 132/83 | HR 57 | Temp 98.3°F | Ht 67.0 in | Wt 238.0 lb

## 2021-10-09 DIAGNOSIS — Z9189 Other specified personal risk factors, not elsewhere classified: Secondary | ICD-10-CM

## 2021-10-09 DIAGNOSIS — Z7985 Long-term (current) use of injectable non-insulin antidiabetic drugs: Secondary | ICD-10-CM | POA: Diagnosis not present

## 2021-10-09 DIAGNOSIS — E1169 Type 2 diabetes mellitus with other specified complication: Secondary | ICD-10-CM

## 2021-10-09 DIAGNOSIS — Z6837 Body mass index (BMI) 37.0-37.9, adult: Secondary | ICD-10-CM | POA: Diagnosis not present

## 2021-10-09 DIAGNOSIS — E669 Obesity, unspecified: Secondary | ICD-10-CM | POA: Diagnosis not present

## 2021-10-09 DIAGNOSIS — E559 Vitamin D deficiency, unspecified: Secondary | ICD-10-CM | POA: Diagnosis not present

## 2021-10-09 MED ORDER — SEMAGLUTIDE-WEIGHT MANAGEMENT 0.25 MG/0.5ML ~~LOC~~ SOAJ
0.2500 mg | SUBCUTANEOUS | 0 refills | Status: DC
  Filled 2021-10-09 – 2021-10-19 (×2): qty 2, 28d supply, fill #0

## 2021-10-09 MED ORDER — METFORMIN HCL 500 MG PO TABS
500.0000 mg | ORAL_TABLET | Freq: Two times a day (BID) | ORAL | 0 refills | Status: DC
  Filled 2021-10-09 – 2021-10-19 (×2): qty 60, 30d supply, fill #0

## 2021-10-10 NOTE — Progress Notes (Signed)
? ? ? ?Chief Complaint:  ? ?OBESITY ?Candace [name] is here to discuss her progress with her obesity treatment plan along with follow-up of her obesity related diagnoses. Candace [name] is on keeping a food journal and adhering to recommended goals of 1500 calories and 90+ grams of protein daily and states she is following her eating plan approximately 60% of the time. Candace [name] states she is doing 0 minutes 0 times per week. ? ?Today's visit was #: 63 ?Starting weight: 249 lbs ?Starting date: 09/17/2017 ?Today's weight: 238 lbs ?Today's date: 10/09/2021 ?Total lbs lost to date: 11 ?Total lbs lost since last in-office visit: 6 ? ?Interim History: Candace [name] has done very well with weight loss in the last 3 weeks. She started [facility] and had some initial nausea, but this has started to improve. ? ?Subjective:  ? ?1. Type 2 diabetes mellitus with other specified complication, without long-term current use of insulin ([place]) ?Candace [name] is stable on metformin, and working on diet and weight loss. No signs of hypoglycemia.  ? ?2. Vitamin D deficiency ?Candace [name]'s Vit D level is at goal. She is now at high risk of over-replacement.  ? ?3. At risk for diabetes mellitus ?Candace [name] is at higher than average risk for developing diabetes due to her obesity. ? ?Assessment/Plan:  ? ?1. Type 2 diabetes mellitus with other specified complication, without long-term current use of insulin ([place]) ?We will refill metformin for 1 month, and Candace [name] will continue her GLP-1. Good blood sugar control is important to decrease the likelihood of diabetic complications such as nephropathy, neuropathy, limb loss, blindness, coronary artery disease, and death. Intensive lifestyle modification including diet, exercise and weight loss are the first line of treatment for diabetes.  ? ?- metFORMIN (GLUCOPHAGE) 500 MG tablet; Take 1 tablet (500 mg total) by mouth 2 (two) times daily with a meal.  Dispense: 60 tablet; Refill: 0 ? ?2. Vitamin D deficiency ?Candace [name] agreed to decrease OTC  Vitamin D to 5,000 IU every other week. She will follow-up for routine testing of Vitamin D, at least 2-3 times per year to avoid over-replacement. ? ?3. At risk for diabetes mellitus ?Candace [name] was given approximately 15 minutes of diabetic education and counseling today. We discussed intensive lifestyle modifications today with an emphasis on weight loss as well as increasing exercise and decreasing simple carbohydrates in her diet. We also reviewed medication options with an emphasis on risk versus benefits of those discussed. ? ?Repetitive spaced learning was employed today to elicit superior memory formation and behavioral change. ? ?4. Obesity with current BMI 37.3 ?Candace [name] is currently in the action stage of change. As such, her goal is to continue with weight loss efforts. She has agreed to keeping a food journal and adhering to recommended goals of 1400-1500 calories and 100+ grams of protein daily.  ? ?We discussed various medication options to help Candace [name] with her weight loss efforts and we both agreed to continue Wegovy, and we will refill for 1 month. ? ?- Semaglutide-Weight Management 0.25 MG/0.5ML SOAJ; Inject 0.25 mg into the skin once a week.  Dispense: 2 mL; Refill: 0 ? ?Behavioral modification strategies: meal planning and cooking strategies. ? ?Candace [name] has agreed to follow-up with our clinic in 3 to 4 weeks. She was informed of the importance of frequent follow-up visits to maximize her success with intensive lifestyle modifications for her multiple health conditions.  ? ?Objective:  ? ?Blood pressure 132/83, pulse (!) 57, temperature 98.3 ?F (36.8 ?C), height '5\' 7"'$  (1.702 m), weight 238 lb (  108 kg), SpO2 98 %. ?Body mass index is 37.28 kg/m?. ? ?General: Cooperative, alert, well developed, in no acute distress. ?HEENT: Conjunctivae and lids unremarkable. ?Cardiovascular: Regular rhythm.  ?Lungs: Normal work of breathing. ?Neurologic: No focal deficits.  ? ?Lab Results  ?Component Value Date  ?  CREATININE 0.88 09/19/2021  ? BUN 13 09/19/2021  ? NA 141 09/19/2021  ? K 3.8 09/19/2021  ? CL 103 09/19/2021  ? CO2 31 09/19/2021  ? ?Lab Results  ?Component Value Date  ? ALT 19 09/18/2021  ? AST 13 09/18/2021  ? ALKPHOS 88 09/18/2021  ? BILITOT 0.2 09/18/2021  ? ?Lab Results  ?Component Value Date  ? HGBA1C 6.3 (H) 09/18/2021  ? HGBA1C 6.2 (H) 12/05/2020  ? HGBA1C 6.2 (H) 04/04/2020  ? HGBA1C 6.0 (H) 09/08/2019  ? HGBA1C 6.2 (H) 02/04/2019  ? ?Lab Results  ?Component Value Date  ? INSULIN 18.8 09/18/2021  ? INSULIN 13.5 12/05/2020  ? INSULIN 22.9 04/04/2020  ? INSULIN 12.6 09/08/2019  ? INSULIN 23.8 02/04/2019  ? ?Lab Results  ?Component Value Date  ? TSH 2.200 12/05/2020  ? ?Lab Results  ?Component Value Date  ? CHOL 145 09/18/2021  ? HDL 56 09/18/2021  ? [place] 65 09/18/2021  ? TRIG 141 09/18/2021  ? CHOLHDL 3.9 04/04/2020  ? ?Lab Results  ?Component Value Date  ? VD25OH 71.0 09/18/2021  ? VD25OH 60.1 12/05/2020  ? VD25OH 41.2 04/04/2020  ? ?Lab Results  ?Component Value Date  ? WBC 7.1 04/15/2019  ? HGB 13.4 04/15/2019  ? HCT 41.4 04/15/2019  ? MCV 83.8 04/15/2019  ? PLT 297 04/15/2019  ? ?No results found for: IRON, TIBC, FERRITIN ? ?Attestation Statements:  ? ?Reviewed by clinician on day of visit: allergies, medications, problem list, medical history, surgical history, family history, social history, and previous encounter notes. ? ? ?I, Trixie Dredge, am acting as transcriptionist for Dennard Nip, MD. ? ?I have reviewed the above documentation for accuracy and completeness, and I agree with the above. -  Dennard Nip, MD ? ? ?

## 2021-10-16 DIAGNOSIS — H35033 Hypertensive retinopathy, bilateral: Secondary | ICD-10-CM | POA: Diagnosis not present

## 2021-10-16 DIAGNOSIS — H3582 Retinal ischemia: Secondary | ICD-10-CM | POA: Diagnosis not present

## 2021-10-16 DIAGNOSIS — H3581 Retinal edema: Secondary | ICD-10-CM | POA: Diagnosis not present

## 2021-10-16 DIAGNOSIS — E119 Type 2 diabetes mellitus without complications: Secondary | ICD-10-CM | POA: Diagnosis not present

## 2021-10-19 ENCOUNTER — Other Ambulatory Visit (HOSPITAL_COMMUNITY): Payer: Self-pay

## 2021-10-29 ENCOUNTER — Other Ambulatory Visit (HOSPITAL_COMMUNITY): Payer: Self-pay

## 2021-10-30 ENCOUNTER — Ambulatory Visit (INDEPENDENT_AMBULATORY_CARE_PROVIDER_SITE_OTHER): Payer: 59 | Admitting: Family Medicine

## 2021-10-30 ENCOUNTER — Other Ambulatory Visit (HOSPITAL_COMMUNITY): Payer: Self-pay

## 2021-10-30 ENCOUNTER — Encounter (INDEPENDENT_AMBULATORY_CARE_PROVIDER_SITE_OTHER): Payer: Self-pay | Admitting: Family Medicine

## 2021-10-30 VITALS — BP 143/77 | HR 98 | Temp 98.0°F | Ht 67.0 in | Wt 234.0 lb

## 2021-10-30 DIAGNOSIS — Z9189 Other specified personal risk factors, not elsewhere classified: Secondary | ICD-10-CM | POA: Insufficient documentation

## 2021-10-30 DIAGNOSIS — Z7984 Long term (current) use of oral hypoglycemic drugs: Secondary | ICD-10-CM

## 2021-10-30 DIAGNOSIS — F3289 Other specified depressive episodes: Secondary | ICD-10-CM

## 2021-10-30 DIAGNOSIS — E1169 Type 2 diabetes mellitus with other specified complication: Secondary | ICD-10-CM | POA: Diagnosis not present

## 2021-10-30 DIAGNOSIS — Z6836 Body mass index (BMI) 36.0-36.9, adult: Secondary | ICD-10-CM | POA: Diagnosis not present

## 2021-10-30 DIAGNOSIS — E669 Obesity, unspecified: Secondary | ICD-10-CM

## 2021-10-30 MED ORDER — SEMAGLUTIDE-WEIGHT MANAGEMENT 0.25 MG/0.5ML ~~LOC~~ SOAJ
0.2500 mg | SUBCUTANEOUS | 0 refills | Status: DC
  Filled 2021-10-30 – 2021-11-21 (×5): qty 2, 28d supply, fill #0

## 2021-11-02 ENCOUNTER — Other Ambulatory Visit (HOSPITAL_COMMUNITY): Payer: Self-pay

## 2021-11-14 ENCOUNTER — Ambulatory Visit: Payer: 59

## 2021-11-14 ENCOUNTER — Ambulatory Visit
Admission: RE | Admit: 2021-11-14 | Discharge: 2021-11-14 | Disposition: A | Discharge status: Home / Self Care | Payer: 59 | Source: Ambulatory Visit | Attending: Adult Health | Admitting: Adult Health

## 2021-11-14 DIAGNOSIS — Z853 Personal history of malignant neoplasm of breast: Secondary | ICD-10-CM | POA: Diagnosis not present

## 2021-11-14 DIAGNOSIS — Z170 Estrogen receptor positive status [ER+]: Secondary | ICD-10-CM

## 2021-11-14 DIAGNOSIS — R928 Other abnormal and inconclusive findings on diagnostic imaging of breast: Secondary | ICD-10-CM | POA: Diagnosis not present

## 2021-11-14 NOTE — Progress Notes (Signed)
Chief Complaint:   OBESITY Candace [name] is here to discuss her progress with her obesity treatment plan along with follow-up of her obesity related diagnoses. Candace [name] is on keeping a food journal and adhering to recommended goals of 1400-1500 calories and 100+ grams of protein daily and states she is following her eating plan approximately 70% of the time. Candace [name] states she is doing 0 minutes 0 times per week.  Today's visit was #: 29 Starting weight: 249 lbs Starting date: 09/17/2017 Today's weight: 234 lbs Today's date: 10/30/2021 Total lbs lost to date: 15 Total lbs lost since last in-office visit: 4  Interim History: Candace [name] has done well with weight loss since her last visit. She has done better with meal planning and avoiding temptations. She is working on increasing her protein. She is doing well on Wegovy.   Subjective:   1. Type 2 diabetes mellitus with other specified complication, without long-term current use of insulin ([place]) Candace [name] started a GLP-1, with no hypoglycemia noted.   2. Other depression, with emotional eating Candace [name] notes decreased emotional eating behaviors recently, and she is feeling more satisfied with decreasing emotional eating.  3. At risk for heart disease Candace [name] is at higher than average risk for cardiovascular disease due to obesity.  Assessment/Plan:   1. Type 2 diabetes mellitus with other specified complication, without long-term current use of insulin ([place]) Candace [name] will continue metformin and her diet, and we will recheck labs in 2 months.   2. Other depression, with emotional eating Candace [name] will continue with meal planning and prepping, and she will continue her GLP-1. We will continue to monitor.  3. At risk for heart disease Candace [name] was given approximately 15 minutes of coronary artery disease prevention counseling today. She is 61 y.o. female and has risk factors for heart disease including obesity. We discussed intensive lifestyle modifications  today with an emphasis on specific weight loss instructions and strategies.  Repetitive spaced learning was employed today to elicit superior memory formation and behavioral change.   4. Obesity, Current BMI 36.8 Candace [name] is currently in the action stage of change. As such, her goal is to continue with weight loss efforts. She has agreed to keeping a food journal and adhering to recommended goals of 1400-1500 calories and 100+ grams of protein daily.   We discussed various medication options to help Candace [name] with her weight loss efforts and we both agreed to continue Wegovy, and we will refill for 1 month.  - Semaglutide-Weight Management 0.25 MG/0.5ML SOAJ; Inject 0.25 mg into the skin once a week.  Dispense: 2 mL; Refill: 0  Behavioral modification strategies: increasing lean protein intake.  Candace [name] has agreed to follow-up with our clinic in 3 weeks. She was informed of the importance of frequent follow-up visits to maximize her success with intensive lifestyle modifications for her multiple health conditions.   Objective:   Blood pressure (!) 143/77, pulse 98, temperature 98 F (36.7 C), height '5\' 7"'$  (1.702 m), weight 234 lb (106.1 kg), SpO2 96 %. Body mass index is 36.65 kg/m.  General: Cooperative, alert, well developed, in no acute distress. HEENT: Conjunctivae and lids unremarkable. Cardiovascular: Regular rhythm.  Lungs: Normal work of breathing. Neurologic: No focal deficits.   Lab Results  Component Value Date   CREATININE 0.88 09/19/2021   BUN 13 09/19/2021   NA 141 09/19/2021   K 3.8 09/19/2021   CL 103 09/19/2021   CO2 31 09/19/2021   Lab Results  Component Value Date  ALT 19 09/18/2021   AST 13 09/18/2021   ALKPHOS 88 09/18/2021   BILITOT 0.2 09/18/2021   Lab Results  Component Value Date   HGBA1C 6.3 (H) 09/18/2021   HGBA1C 6.2 (H) 12/05/2020   HGBA1C 6.2 (H) 04/04/2020   HGBA1C 6.0 (H) 09/08/2019   HGBA1C 6.2 (H) 02/04/2019   Lab Results  Component  Value Date   INSULIN 18.8 09/18/2021   INSULIN 13.5 12/05/2020   INSULIN 22.9 04/04/2020   INSULIN 12.6 09/08/2019   INSULIN 23.8 02/04/2019   Lab Results  Component Value Date   TSH 2.200 12/05/2020   Lab Results  Component Value Date   CHOL 145 09/18/2021   HDL 56 09/18/2021   LDLCALC 65 09/18/2021   TRIG 141 09/18/2021   CHOLHDL 3.9 04/04/2020   Lab Results  Component Value Date   VD25OH 71.0 09/18/2021   VD25OH 60.1 12/05/2020   VD25OH 41.2 04/04/2020   Lab Results  Component Value Date   WBC 7.1 04/15/2019   HGB 13.4 04/15/2019   HCT 41.4 04/15/2019   MCV 83.8 04/15/2019   PLT 297 04/15/2019   No results found for: IRON, TIBC, FERRITIN  Attestation Statements:   Reviewed by clinician on day of visit: allergies, medications, problem list, medical history, surgical history, family history, social history, and previous encounter notes.   I, Trixie Dredge, am acting as transcriptionist for Dennard Nip, MD.  I have reviewed the above documentation for accuracy and completeness, and I agree with the above. -  Dennard Nip, MD

## 2021-11-15 ENCOUNTER — Other Ambulatory Visit (HOSPITAL_COMMUNITY): Payer: Self-pay

## 2021-11-16 ENCOUNTER — Other Ambulatory Visit (HOSPITAL_COMMUNITY): Payer: Self-pay

## 2021-11-20 ENCOUNTER — Other Ambulatory Visit (HOSPITAL_COMMUNITY): Payer: Self-pay

## 2021-11-21 ENCOUNTER — Other Ambulatory Visit (HOSPITAL_COMMUNITY): Payer: Self-pay

## 2021-11-21 ENCOUNTER — Telehealth (INDEPENDENT_AMBULATORY_CARE_PROVIDER_SITE_OTHER): Payer: Self-pay | Admitting: Family Medicine

## 2021-11-21 ENCOUNTER — Encounter (INDEPENDENT_AMBULATORY_CARE_PROVIDER_SITE_OTHER): Payer: Self-pay

## 2021-11-21 NOTE — Telephone Encounter (Signed)
Dr. Beasley - Prior authorization approved for Wegovy. Effective: 11/21/2021 to 11/21/2022. Patient sent approval message via mychart.  

## 2021-11-22 ENCOUNTER — Encounter (INDEPENDENT_AMBULATORY_CARE_PROVIDER_SITE_OTHER): Payer: Self-pay | Admitting: Family Medicine

## 2021-11-22 ENCOUNTER — Ambulatory Visit (INDEPENDENT_AMBULATORY_CARE_PROVIDER_SITE_OTHER): Payer: 59 | Admitting: Family Medicine

## 2021-11-22 ENCOUNTER — Other Ambulatory Visit (HOSPITAL_COMMUNITY): Payer: Self-pay

## 2021-11-22 VITALS — BP 150/81 | HR 89 | Temp 97.9°F | Ht 67.0 in | Wt 234.0 lb

## 2021-11-22 DIAGNOSIS — E669 Obesity, unspecified: Secondary | ICD-10-CM | POA: Diagnosis not present

## 2021-11-22 DIAGNOSIS — Z6836 Body mass index (BMI) 36.0-36.9, adult: Secondary | ICD-10-CM | POA: Diagnosis not present

## 2021-11-22 DIAGNOSIS — I10 Essential (primary) hypertension: Secondary | ICD-10-CM | POA: Diagnosis not present

## 2021-11-22 DIAGNOSIS — Z7984 Long term (current) use of oral hypoglycemic drugs: Secondary | ICD-10-CM

## 2021-11-22 DIAGNOSIS — E1169 Type 2 diabetes mellitus with other specified complication: Secondary | ICD-10-CM

## 2021-11-22 DIAGNOSIS — Z9189 Other specified personal risk factors, not elsewhere classified: Secondary | ICD-10-CM

## 2021-11-22 DIAGNOSIS — E119 Type 2 diabetes mellitus without complications: Secondary | ICD-10-CM | POA: Insufficient documentation

## 2021-11-22 MED ORDER — METFORMIN HCL 500 MG PO TABS
500.0000 mg | ORAL_TABLET | Freq: Two times a day (BID) | ORAL | 0 refills | Status: DC
  Filled 2021-11-22: qty 60, 30d supply, fill #0

## 2021-11-22 MED ORDER — SEMAGLUTIDE-WEIGHT MANAGEMENT 0.25 MG/0.5ML ~~LOC~~ SOAJ
0.2500 mg | SUBCUTANEOUS | 0 refills | Status: DC
  Filled 2021-11-22: qty 2, 28d supply, fill #0

## 2021-12-04 NOTE — Progress Notes (Signed)
Chief Complaint:   OBESITY Candace [name] is here to discuss her progress with her obesity treatment plan along with follow-up of her obesity related diagnoses. Candace [name] is on keeping a food journal and adhering to recommended goals of 1400-1500 calories and 100+ grams of protein daily and states she is following her eating plan approximately 70% of the time. Candace [name] states she is doing 0 minutes 0 times per week.  Today's visit was #: 43 Starting weight: 249 lbs Starting date: 09/17/2017 Today's weight: 234 lbs Today's date: 11/22/2021 Total lbs lost to date: 15 Total lbs lost since last in-office visit: 0  Interim History: Candace [name] has done well with maintaining her weight. She is working on meeting her protein goals, but she is needing to use protein supplements to do so.   Subjective:   1. Type 2 diabetes mellitus with other specified complication, without long-term current use of insulin ([place]) Candace [name] was out of Ozempic for 1 week due to pharmacy shortages. She was just approved for [facility].   2. Essential hypertension Candace [name]'s blood pressure was elevated today. She feels she was rushed this morning and this was the cause. She has no problems with her medications.  3. At risk for heart disease Candace [name] is at higher than average risk for cardiovascular disease due to obesity.  Assessment/Plan:   1. Type 2 diabetes mellitus with other specified complication, without long-term current use of insulin ([facility]) We will refill metformin for 1 month. Good blood sugar control is important to decrease the likelihood of diabetic complications such as nephropathy, neuropathy, limb loss, blindness, coronary artery disease, and death. Intensive lifestyle modification including diet, exercise and weight loss are the first line of treatment for diabetes.   - metFORMIN (GLUCOPHAGE) 500 MG tablet; Take 1 tablet (500 mg total) by mouth 2 (two) times daily with a meal.  Dispense: 60 tablet; Refill: 0  2.  Essential hypertension Jeryn is to check her blood pressure at home, and we will follow up at her next visit. She will continue with her diet, exercise, and weight loss.   3. At risk for heart disease Candace [name] was given approximately 15 minutes of coronary artery disease prevention counseling today. She is 61 y.o. female and has risk factors for heart disease including obesity. We discussed intensive lifestyle modifications today with an emphasis on specific weight loss instructions and strategies.  Repetitive spaced learning was employed today to elicit superior memory formation and behavioral change.   4. Obesity, Current BMI 36.7 Candace [name] is currently in the action stage of change. As such, her goal is to continue with weight loss efforts. She has agreed to keeping a food journal and adhering to recommended goals of 1400-1500 calories and 100+ grams of protein daily.   We discussed various medication options to help Loraine with her weight loss efforts and we both agreed to continue Wegovy 0.25 mg q week, and we will refill for 1 month.  - Semaglutide-Weight Management 0.25 MG/0.5ML SOAJ; Inject 0.25 mg into the skin once a week.  Dispense: 2 mL; Refill: 0  Candace [name] is to work on increasing "real food" protein.   Behavioral modification strategies: increasing lean protein intake.  Candace [name] has agreed to follow-up with our clinic in 3 weeks. She was informed of the importance of frequent follow-up visits to maximize her success with intensive lifestyle modifications for her multiple health conditions.   Objective:   Blood pressure (!) 150/81, pulse 89, temperature 97.9 F (36.6 C), height '5\' 7"'$  (  1.702 m), weight 234 lb (106.1 kg), SpO2 95 %. Body mass index is 36.65 kg/m.  General: Cooperative, alert, well developed, in no acute distress. HEENT: Conjunctivae and lids unremarkable. Cardiovascular: Regular rhythm.  Lungs: Normal work of breathing. Neurologic: No focal deficits.   Lab  Results  Component Value Date   CREATININE 0.88 09/19/2021   BUN 13 09/19/2021   NA 141 09/19/2021   K 3.8 09/19/2021   CL 103 09/19/2021   CO2 31 09/19/2021   Lab Results  Component Value Date   ALT 19 09/18/2021   AST 13 09/18/2021   ALKPHOS 88 09/18/2021   BILITOT 0.2 09/18/2021   Lab Results  Component Value Date   HGBA1C 6.3 (H) 09/18/2021   HGBA1C 6.2 (H) 12/05/2020   HGBA1C 6.2 (H) 04/04/2020   HGBA1C 6.0 (H) 09/08/2019   HGBA1C 6.2 (H) 02/04/2019   Lab Results  Component Value Date   INSULIN 18.8 09/18/2021   INSULIN 13.5 12/05/2020   INSULIN 22.9 04/04/2020   INSULIN 12.6 09/08/2019   INSULIN 23.8 02/04/2019   Lab Results  Component Value Date   TSH 2.200 12/05/2020   Lab Results  Component Value Date   CHOL 145 09/18/2021   HDL 56 09/18/2021   LDLCALC 65 09/18/2021   TRIG 141 09/18/2021   CHOLHDL 3.9 04/04/2020   Lab Results  Component Value Date   VD25OH 71.0 09/18/2021   VD25OH 60.1 12/05/2020   VD25OH 41.2 04/04/2020   Lab Results  Component Value Date   WBC 7.1 04/15/2019   HGB 13.4 04/15/2019   HCT 41.4 04/15/2019   MCV 83.8 04/15/2019   PLT 297 04/15/2019   No results found for: IRON, TIBC, FERRITIN  Attestation Statements:   Reviewed by clinician on day of visit: allergies, medications, problem list, medical history, surgical history, family history, social history, and previous encounter notes.   I, Trixie Dredge, am acting as transcriptionist for Dennard Nip, MD.  I have reviewed the above documentation for accuracy and completeness, and I agree with the above. -  Dennard Nip, MD

## 2021-12-11 ENCOUNTER — Ambulatory Visit (INDEPENDENT_AMBULATORY_CARE_PROVIDER_SITE_OTHER): Payer: 59 | Admitting: Family Medicine

## 2021-12-11 ENCOUNTER — Other Ambulatory Visit (HOSPITAL_COMMUNITY): Payer: Self-pay

## 2021-12-11 ENCOUNTER — Encounter (INDEPENDENT_AMBULATORY_CARE_PROVIDER_SITE_OTHER): Payer: Self-pay | Admitting: Family Medicine

## 2021-12-11 VITALS — BP 149/73 | HR 82 | Temp 98.3°F | Ht 67.0 in | Wt 238.0 lb

## 2021-12-11 DIAGNOSIS — Z7985 Long-term (current) use of injectable non-insulin antidiabetic drugs: Secondary | ICD-10-CM

## 2021-12-11 DIAGNOSIS — E1169 Type 2 diabetes mellitus with other specified complication: Secondary | ICD-10-CM

## 2021-12-11 DIAGNOSIS — Z9189 Other specified personal risk factors, not elsewhere classified: Secondary | ICD-10-CM

## 2021-12-11 DIAGNOSIS — Z6837 Body mass index (BMI) 37.0-37.9, adult: Secondary | ICD-10-CM

## 2021-12-11 DIAGNOSIS — I10 Essential (primary) hypertension: Secondary | ICD-10-CM

## 2021-12-11 DIAGNOSIS — E669 Obesity, unspecified: Secondary | ICD-10-CM | POA: Diagnosis not present

## 2021-12-12 ENCOUNTER — Other Ambulatory Visit (HOSPITAL_COMMUNITY): Payer: Self-pay

## 2021-12-12 MED ORDER — WEGOVY 0.5 MG/0.5ML ~~LOC~~ SOAJ
0.5000 mg | SUBCUTANEOUS | 0 refills | Status: DC
  Filled 2021-12-12: qty 2, 28d supply, fill #0

## 2021-12-12 NOTE — Progress Notes (Signed)
Chief Complaint:   OBESITY Candace [name] is here to discuss her progress with her obesity treatment plan along with follow-up of her obesity related diagnoses. Candace [name] is on keeping a food journal and adhering to recommended goals of 1400-1500 calories and 100+ grams of protein daily and states she is following her eating plan approximately 75% of the time. Candace [name] states she is doing 0 minutes 0 times per week.  Today's visit was #: 64 Starting weight: 249 lbs Starting date: 09/17/2017 Today's weight: 238 lbs Today's date: 12/11/2021 Total lbs lost to date: 11 Total lbs lost since last in-office visit: 0  Interim History: Candace [name] has gained some weight since her last visit, but much of this appears to be water weight.  She has been feeling overwhelmed recently especially with work being hectic.  Meal planning has been especially difficult, but she is working on increasing her protein.  Subjective:   1. Hypertension, unspecified type [name]'s blood pressure is elevated today.  She is working on her diet but she is retaining some fluid.  This may be due to increased sodium.  2. Type 2 diabetes mellitus with other specified complication, without long-term current use of insulin ([place]) Candace [name] is on GLP-1, and she is working on weight loss.  She has no signs of hypoglycemia.  3. At risk for heart disease Candace [name] is at higher than average risk for cardiovascular disease due to obesity.  Assessment/Plan:   1. Hypertension, unspecified type High sodium foods were discussed with the patient today.  The goal is to keep sodium below 2000 g/day.  2. Type 2 diabetes mellitus with other specified complication, without long-term current use of insulin ([place]) Candace [name] will continue her diet and GLP-1, and will continue to monitor.  3. At risk for heart disease Candace [name] was given approximately 15 minutes of coronary artery disease prevention counseling today. She is 61 y.o. female and has risk factors for  heart disease including obesity. We discussed intensive lifestyle modifications today with an emphasis on specific weight loss instructions and strategies.  Repetitive spaced learning was employed today to elicit superior memory formation and behavioral change.   4. Obesity, Current BMI 37.3 Candace [name] is currently in the action stage of change. As such, her goal is to continue with weight loss efforts. She has agreed to keeping a food journal and adhering to recommended goals of 1400-1500 calories and 100+ grams of protein daily.   We discussed various medication options to help Candace [name] with her weight loss efforts and we both agreed to increase Wegovy to 0.5 mg q week, and we will refill for 1 month.  Behavioral modification strategies: increasing lean protein intake and decreasing sodium intake.  Candace [name] has agreed to follow-up with our clinic in 3 to 4 weeks. She was informed of the importance of frequent follow-up visits to maximize her success with intensive lifestyle modifications for her multiple health conditions.   Objective:   Blood pressure (!) 149/73, pulse 82, temperature 98.3 F (36.8 C), height '5\' 7"'$  (1.702 m), weight 238 lb (108 kg), SpO2 98 %. Body mass index is 37.28 kg/m.  General: Cooperative, alert, well developed, in no acute distress. HEENT: Conjunctivae and lids unremarkable. Cardiovascular: Regular rhythm.  Lungs: Normal work of breathing. Neurologic: No focal deficits.   Lab Results  Component Value Date   CREATININE 0.88 09/19/2021   BUN 13 09/19/2021   NA 141 09/19/2021   K 3.8 09/19/2021   CL 103 09/19/2021   CO2 31 09/19/2021  Lab Results  Component Value Date   ALT 19 09/18/2021   AST 13 09/18/2021   ALKPHOS 88 09/18/2021   BILITOT 0.2 09/18/2021   Lab Results  Component Value Date   HGBA1C 6.3 (H) 09/18/2021   HGBA1C 6.2 (H) 12/05/2020   HGBA1C 6.2 (H) 04/04/2020   HGBA1C 6.0 (H) 09/08/2019   HGBA1C 6.2 (H) 02/04/2019   Lab Results   Component Value Date   INSULIN 18.8 09/18/2021   INSULIN 13.5 12/05/2020   INSULIN 22.9 04/04/2020   INSULIN 12.6 09/08/2019   INSULIN 23.8 02/04/2019   Lab Results  Component Value Date   TSH 2.200 12/05/2020   Lab Results  Component Value Date   CHOL 145 09/18/2021   HDL 56 09/18/2021   LDLCALC 65 09/18/2021   TRIG 141 09/18/2021   CHOLHDL 3.9 04/04/2020   Lab Results  Component Value Date   VD25OH 71.0 09/18/2021   VD25OH 60.1 12/05/2020   VD25OH 41.2 04/04/2020   Lab Results  Component Value Date   WBC 7.1 04/15/2019   HGB 13.4 04/15/2019   HCT 41.4 04/15/2019   MCV 83.8 04/15/2019   PLT 297 04/15/2019   No results found for: "IRON", "TIBC", "FERRITIN"  Attestation Statements:   Reviewed by clinician on day of visit: allergies, medications, problem list, medical history, surgical history, family history, social history, and previous encounter notes.   I, Trixie Dredge, am acting as transcriptionist for Dennard Nip, MD.  I have reviewed the above documentation for accuracy and completeness, and I agree with the above. -  Dennard Nip, MD

## 2021-12-12 NOTE — Addendum Note (Signed)
Addended by: Ronaldo Miyamoto on: 12/12/2021 02:33 PM   Modules accepted: Orders

## 2021-12-18 DIAGNOSIS — Z01419 Encounter for gynecological examination (general) (routine) without abnormal findings: Secondary | ICD-10-CM | POA: Diagnosis not present

## 2021-12-19 ENCOUNTER — Other Ambulatory Visit (HOSPITAL_COMMUNITY): Payer: Self-pay

## 2021-12-19 ENCOUNTER — Encounter: Payer: Self-pay | Admitting: Internal Medicine

## 2021-12-19 ENCOUNTER — Ambulatory Visit: Payer: 59 | Admitting: Internal Medicine

## 2021-12-19 VITALS — BP 140/78 | HR 85 | Ht 67.0 in | Wt 238.0 lb

## 2021-12-19 DIAGNOSIS — I10 Essential (primary) hypertension: Secondary | ICD-10-CM | POA: Diagnosis not present

## 2021-12-19 DIAGNOSIS — Z87898 Personal history of other specified conditions: Secondary | ICD-10-CM

## 2021-12-19 DIAGNOSIS — Z79899 Other long term (current) drug therapy: Secondary | ICD-10-CM | POA: Diagnosis not present

## 2021-12-19 DIAGNOSIS — E785 Hyperlipidemia, unspecified: Secondary | ICD-10-CM | POA: Diagnosis not present

## 2021-12-19 DIAGNOSIS — E118 Type 2 diabetes mellitus with unspecified complications: Secondary | ICD-10-CM | POA: Diagnosis not present

## 2021-12-19 DIAGNOSIS — I4510 Unspecified right bundle-branch block: Secondary | ICD-10-CM | POA: Diagnosis not present

## 2021-12-19 MED ORDER — LOSARTAN POTASSIUM 50 MG PO TABS
50.0000 mg | ORAL_TABLET | Freq: Every day | ORAL | 3 refills | Status: DC
Start: 2021-12-19 — End: 2023-04-08
  Filled 2021-12-19: qty 90, 90d supply, fill #0
  Filled 2022-03-27: qty 90, 90d supply, fill #1
  Filled 2022-07-26: qty 90, 90d supply, fill #2
  Filled 2022-11-20: qty 90, 90d supply, fill #3

## 2021-12-19 NOTE — Progress Notes (Addendum)
Cardiology Office Note:    Date:  12/19/2021   ID:  Candace [name], DOB 1960-07-23, MRN [id number]  PCP:  Candace Stains, MD  Cardiologist:  Candace Munroe, MD  Electrophysiologist:  None   Referring MD: Candace Stains, MD   Chief Complaint/Reason for Referral: Follow-up for chest pain  History of Present Illness:    Candace [name] is a 61 y.o. female with a history of pre-diabetes, obesity, right breast cancer, depression, GERD, HLD, carpal tunnel, who presents for follow-up. She was initially seen 05/01/2021 for the evaluation of chest pain.  Since her last visit, she had a Coronary CT 05/2021 which showed minimal non-obstructive CAD with a coronary calcium score of 2 (65th percentile). There was trivial calcium seen in the proximal LAD but no significant stenosis. Her Echo 05/2021 revealed LVEF 60-65% and grade 1 diastolic dysfunction. There was aortic valve sclerosis, but no stenosis. Small PFO with predominantly left to right shunting across the atrial septum was noted.   She followed up with Candace Rives, PA-C on 06/12/2021 where she was doing well. She reported only a few brief episodes of chest pain she described as "twinges". Her blood pressure was elevated at 160/80; at home readings were typically 140's-150's. She was started on Losartan 25 mg daily.  Today, her blood pressure and ongoing weight loss efforts are her main concerns. Her blood pressure is still a little elevated in clinic today at 140/78. Typically she does not monitor her BP at home, but states that she is able.  She reports that she started [facility] since her last visit, which has been working well for her. Unfortunately she notes they are on backorder until September.   Regarding her diet, she has been following a food plan which has been successful so long as she remains compliant. Occasionally she may lapse with eating portions larger than she can handle, but she continues to work well with her dietary  changes. She has been transitioning into preparing meals for one, as her daughter is starting college soon. Also she has stopped eating fried foods. Her most recent LDL was 65, and her triglycerides were 141.  She denies any palpitations, chest pain, shortness of breath, or peripheral edema. No lightheadedness, headaches, syncope, orthopnea, or PND.    Past Medical History:  Diagnosis Date   Abnormal Pap smear    Alcohol abuse    Anovulation    Anxiety    Breast cancer ([place]) 04/05/2016   right breast   Depression    Endometrial hyperplasia    Endometrial polyp    h/o   Epidermal cyst    h/o   Gallbladder problem    GERD (gastroesophageal reflux disease)    H/O hemorrhoids    H/O menorrhagia    High risk HPV infection    Hyperlipidemia    Hypertension    Insomnia    Non-obstructive CAD    minimal non-obstructive CAD noted on coronary CTA in 05/2021   Obesity    Oligomenorrhea    Prediabetes    Restless legs syndrome    Seasonal allergies    Sleep apnea    Type 2 diabetes mellitus ([place])    Vitamin D deficiency     Past Surgical History:  Procedure Laterality Date   BREAST LUMPECTOMY Right 05/2016   radiation   BREAST LUMPECTOMY WITH RADIOACTIVE SEED AND SENTINEL LYMPH NODE BIOPSY Right 05/28/2016   Procedure: RADIOACTIVE SEED GUIDED RIGHT BREAST LUMPECTOMY WITH  RIGHT AXILLARY SENTINEL LYMPH NODE  BIOPSY;  Surgeon: [name], MD;  Location: [place];  Service: General;  Laterality: Right;   CARPAL TUNNEL RELEASE  03/26/2011   right hand   CARPAL TUNNEL RELEASE  05/28/2011   Procedure: CARPAL TUNNEL RELEASE;  Surgeon: [name];  Location: [facility];  Service: Orthopedics;  Laterality: Left;   CHOLECYSTECTOMY N/A 01/24/2013   Procedure: LAPAROSCOPIC CHOLECYSTECTOMY WITH INTRAOPERATIVE CHOLANGIOGRAM;  Surgeon: [name], MD;  Location: [facility];  Service: General;  Laterality: N/A;   COLONOSCOPY     HYSTEROSCOPY     [place]   as child   TRIGGER FINGER RELEASE Right 09/20/2021   Procedure: RIGHT MIDDLE FINGER A-1 PULLEY RELEASE;  Surgeon: [name], MD;  Location: [facility];  Service: Orthopedics;  Laterality: Right;   trigger fingers  8 yrs. ago   both done months apart   uterine ablation  06/04/2010   uterine ablation      Current Medications: Current Meds  Medication Sig   anastrozole (ARIMIDEX) 1 MG tablet Take 1 tablet (1 mg total) by mouth daily.   buPROPion (WELLBUTRIN XL) 300 MG 24 hr tablet Take 1 tablet (300 mg total) by mouth every morning.   cetirizine (ZYRTEC) 10 MG tablet Take 10 mg by mouth daily. [facility]   Cholecalciferol (VITAMIN D-3) 125 MCG (5000 UT) TABS Take 5,000 Units by mouth daily. (Patient taking differently: Take 50 mg by mouth daily.)   esomeprazole (NEXIUM) 40 MG capsule Take 1 capsule (40 mg total) by mouth daily.   FLUoxetine (PROZAC) 10 MG capsule Take 1 capsule (10 mg total) by mouth daily.   metFORMIN (GLUCOPHAGE) 500 MG tablet Take 1 tablet (500 mg total) by mouth 2 (two) times daily with a meal.   Multiple Vitamin (MULTIVITAMIN WITH MINERALS) TABS tablet Take 1 tablet by mouth daily.   rOPINIRole (REQUIP) 1 MG tablet Take 1 tablet (1 mg total) by mouth 1 to 3 hours before bedtime.   rosuvastatin (CRESTOR) 10 MG tablet Take 1 tablet (10 mg total) by mouth every evening.   Semaglutide-Weight Management (WEGOVY) 0.5 MG/0.5ML SOAJ Inject 0.5 mg into the skin once a week.   triamterene-hydrochlorothiazide (MAXZIDE-25) 37.5-25 MG tablet Take 1 tablet by mouth daily.   [DISCONTINUED] losartan (COZAAR) 25 MG tablet Take 1 tablet (25 mg total) by mouth daily.     Allergies:   Patient has no known allergies.   Social History   Tobacco Use   Smoking status: Never   Smokeless tobacco: Never  Vaping Use   Vaping Use: Never used  Substance Use Topics   Alcohol use: No    Comment: none since 1996   Drug use: No      Family History: The patient's family history includes Alcohol abuse in her father and mother; Anxiety disorder in her father and mother; Breast cancer (age of onset: 31) in her sister; Dementia in her mother; Depression in her father and mother; Heart attack in her maternal grandmother; Heart disease in her maternal grandfather, maternal grandmother, and mother; Hyperlipidemia in her mother; Hypertension in her mother; Leukemia in her paternal grandmother; Obesity in her mother; Pulmonary fibrosis in her father; Stroke in her mother.  ROS:   Please see the history of present illness.    All other systems reviewed and are negative.  EKGs/Labs/Other Studies Reviewed:    The following studies were reviewed today:  Echocardiogram  05/16/2021:  1. Left ventricular ejection  fraction, by estimation, is 60 to 65%. The  left ventricle has normal function. The left ventricle has no regional  wall motion abnormalities. There is mild concentric left ventricular  hypertrophy. Left ventricular diastolic  parameters are consistent with Grade I diastolic dysfunction (impaired  relaxation). The average left ventricular global longitudinal strain is  -16.8 %. The global longitudinal strain is normal.   2. Right ventricular systolic function is normal. The right ventricular  size is normal.   3. Left atrial size was mildly dilated.   4. The mitral valve is normal in structure. Trivial mitral valve  regurgitation. No evidence of mitral stenosis.   5. The aortic valve is tricuspid. Aortic valve regurgitation is not  visualized. Aortic valve sclerosis is present, with no evidence of aortic  valve stenosis.   6. The inferior vena cava is normal in size with greater than 50%  respiratory variability, suggesting right atrial pressure of 3 mmHg.   7. There is a small patent foramen ovale with predominantly left to right  shunting across the atrial septum.   Coronary CT  05/11/2021: FINDINGS: Coronary  calcium score: The patient's coronary artery calcium score is 2, which places the patient in the 65th percentile.   Coronary arteries: Normal coronary origins.  Right dominance.   Right Coronary Artery: Normal caliber vessel, gives rise to PDA. No significant plaque or stenosis.   Left Main Coronary Artery: Normal caliber vessel. No significant plaque or stenosis.   Left Anterior Descending Coronary Artery: Normal caliber vessel. No significant plaque or stenosis, trivial calcification. Gives rise to 3 small diagonal branches.   Left Circumflex Artery: Normal caliber vessel. No significant plaque or stenosis. Gives rise to large OM1 branch.   Aorta: Normal size, 28 mm at the mid ascending aorta (level of the PA bifurcation) measured double oblique. No calcifications. No dissection seen in visualized portions of the aorta.   Aortic Valve: No calcifications. Trileaflet.   Other findings:   Normal pulmonary vein drainage into the left atrium.   Normal left atrial appendage without a thrombus.   Normal size of the pulmonary artery.   Normal appearance of the pericardium.   Mild mitral annular calcification.   IMPRESSION: 1. Minimal nonobstructive CAD, CADRADS = 1. There is trivial calcium seen in the proximal LAD but no significant stenosis   2. Coronary calcium score of 2. This was 65th percentile for age and sex matched control.   3. Normal coronary origin with right dominance.   EKG:   EKG is personally reviewed. 12/19/2021:  Sinus rhythm. RBBB. QRS duration 134 msec 05/01/21: Sinus rhythm, rate 97 bpm, RBBB 03/01/21 (Dr. Leafy Ro): Sinus rhythm, rate 81, RBBB  Imaging studies that I have independently reviewed today:   Recent Labs: 09/18/2021: ALT 19 09/19/2021: BUN 13; Creatinine, Ser 0.88; Potassium 3.8; Sodium 141   Recent Lipid Panel    Component Value Date/Time   CHOL 145 09/18/2021 0911   TRIG 141 09/18/2021 0911   HDL 56 09/18/2021 0911   CHOLHDL 3.9  04/04/2020 0810   LDLCALC 65 09/18/2021 0911    Physical Exam:    VS:  BP 140/78   Pulse 85   Ht '5\' 7"'$  (1.702 m)   Wt 238 lb (108 kg)   SpO2 98%   BMI 37.28 kg/m     Wt Readings from Last 5 Encounters:  12/19/21 238 lb (108 kg)  12/11/21 238 lb (108 kg)  11/22/21 234 lb (106.1 kg)  10/30/21 234 lb (106.1 kg)  10/09/21 238 lb (108 kg)    Constitutional: No acute distress Eyes: sclera non-icteric, normal conjunctiva and lids ENMT: normal dentition, moist mucous membranes Cardiovascular: regular rhythm, normal rate, no murmur. S1 and S2 normal. No jugular venous distention.  Respiratory: clear to auscultation bilaterally GI : normal bowel sounds, soft and nontender. No distention.   MSK: extremities warm, well perfused. No edema.  NEURO: grossly nonfocal exam, moves all extremities. PSYCH: alert and oriented x 3, normal mood and affect.   ASSESSMENT:    1. Primary hypertension   2. Hyperlipidemia, unspecified hyperlipidemia type   3. History of chest pain   4. Type 2 diabetes mellitus with complication, without long-term current use of insulin ([place])   5. Right bundle branch block   6. Medication management     PLAN:    Primary hypertension - Plan: EKG 12-Lead Med management -Increase losartan to 50 mg; she will take another week of 2 tablets of 25 mg to finish her prescription before transitioning to 50 mg tablets. She goes frequently for follow up with [facility], will alert [name] if there are concerns about BP.   Hyperlipidemia, unspecified hyperlipidemia type -lipids excellent on crestor 10 mg daily.  History of chest pain - improved, no worrisome cardiac cause identified.   Type 2 diabetes mellitus with complication, without long-term current use of insulin ([place]) - working with PCP and [facility] for management.  Right bundle branch block - no red flag symptoms of advancing conduction system disease.       Follow-up:  6 months.  Total time of  encounter: 30 minutes total time of encounter, including 25 minutes spent in face-to-face patient care on the date of this encounter. This time includes coordination of care and counseling regarding above mentioned problem list. Remainder of non-face-to-face time involved reviewing chart documents/testing relevant to the patient encounter and documentation in the medical record. I have independently reviewed documentation from referring provider.   Cherlynn Kaiser, MD, [place]   Shared Decision Making/Informed Consent:       Medication Adjustments/Labs and Tests Ordered: Current medicines are reviewed at length with the patient today.  Concerns regarding medicines are outlined above.   Orders Placed This Encounter  Procedures   EKG 12-Lead   Meds ordered this encounter  Medications   losartan (COZAAR) 50 MG tablet    Sig: Take 1 tablet (50 mg total) by mouth daily.    Dispense:  90 tablet    Refill:  3   Patient Instructions  Medication Instructions:  INCREASE LOSARTAN TO '50mg'$  ONCE DAILY  *If you need a refill on your cardiac medications before your next appointment, please call your pharmacy*  Follow-Up: At [facility], you and your health needs are our priority.  As part of our continuing mission to provide you with exceptional heart care, we have created designated Provider Care Teams.  These Care Teams include your primary Cardiologist (physician) and Advanced Practice Providers (APPs -  Physician Assistants and Nurse Practitioners) who all work together to provide you with the care you need, when you need it.   Your next appointment:   6 month(s)  The format for your next appointment:   In Person  Provider:   Elouise Munroe, MD     [employer] Stumpf,acting as a scribe for Candace Munroe, MD.,have documented all relevant documentation on the behalf of Candace Munroe, MD,as directed by  Candace Munroe, MD while in the presence  of  Candace Munroe, MD.  I, Candace Munroe, MD, have reviewed all documentation for this visit. The documentation on today's date of service for the exam, diagnosis, procedures, and orders are all accurate and complete.

## 2021-12-19 NOTE — Patient Instructions (Signed)
Medication Instructions:  INCREASE LOSARTAN TO '50mg'$  ONCE DAILY  *If you need a refill on your cardiac medications before your next appointment, please call your pharmacy*  Follow-Up: At [facility], you and your health needs are our priority.  As part of our continuing mission to provide you with exceptional heart care, we have created designated Provider Care Teams.  These Care Teams include your primary Cardiologist (physician) and Advanced Practice Providers (APPs -  Physician Assistants and Nurse Practitioners) who all work together to provide you with the care you need, when you need it.   Your next appointment:   6 month(s)  The format for your next appointment:   In Person  Provider:   Elouise Munroe, MD

## 2022-01-02 ENCOUNTER — Ambulatory Visit (INDEPENDENT_AMBULATORY_CARE_PROVIDER_SITE_OTHER): Payer: 59 | Admitting: Family Medicine

## 2022-01-02 ENCOUNTER — Encounter (INDEPENDENT_AMBULATORY_CARE_PROVIDER_SITE_OTHER): Payer: Self-pay | Admitting: Family Medicine

## 2022-01-02 ENCOUNTER — Other Ambulatory Visit (HOSPITAL_COMMUNITY): Payer: Self-pay

## 2022-01-02 DIAGNOSIS — Z7984 Long term (current) use of oral hypoglycemic drugs: Secondary | ICD-10-CM | POA: Diagnosis not present

## 2022-01-02 DIAGNOSIS — E669 Obesity, unspecified: Secondary | ICD-10-CM

## 2022-01-02 DIAGNOSIS — E1169 Type 2 diabetes mellitus with other specified complication: Secondary | ICD-10-CM

## 2022-01-02 DIAGNOSIS — Z6837 Body mass index (BMI) 37.0-37.9, adult: Secondary | ICD-10-CM | POA: Diagnosis not present

## 2022-01-02 MED ORDER — WEGOVY 0.5 MG/0.5ML ~~LOC~~ SOAJ
0.5000 mg | SUBCUTANEOUS | 0 refills | Status: DC
  Filled 2022-01-02: qty 2, 28d supply, fill #0

## 2022-01-02 MED ORDER — METFORMIN HCL 500 MG PO TABS
500.0000 mg | ORAL_TABLET | Freq: Two times a day (BID) | ORAL | 0 refills | Status: DC
  Filled 2022-01-02: qty 60, 30d supply, fill #0

## 2022-01-03 NOTE — Progress Notes (Signed)
Chief Complaint:   OBESITY Candace [name] is here to discuss her progress with her obesity treatment plan along with follow-up of her obesity related diagnoses. Candace [name] is on keeping a food journal and adhering to recommended goals of 1400-1500 calories and 100+ grams of protein daily and states she is following her eating plan approximately 70% of the time. Candace [name] states she is walking 2-3 times per week.    Today's visit was #: 63 Starting weight: 249 lbs Starting date: 09/17/2017 Today's weight: 238 lbs Today's date: 01/02/2022 Total lbs lost to date: 11 Total lbs lost since last in-office visit: 0  Interim History: Candace [name] has done well with maintaining her weight even while traveling on vacation to visit her family.  She has been out of [facility] for 2 months due to medication shortages.  Subjective:   1. Type 2 diabetes mellitus with other specified complication, without long-term current use of insulin (HCC) [name] is working on her diet and exercise.  She is taking her second metformin dose in the p.m. and may not be getting all of the benefit she could be.  Assessment/Plan:   1. Type 2 diabetes mellitus with other specified complication, without long-term current use of insulin (HCC) We will refill metformin 500 mg twice daily for 1 month. [name] is to take her second dose at dinner instead of bedtime.  - metFORMIN (GLUCOPHAGE) 500 MG tablet; Take 1 tablet (500 mg total) by mouth 2 (two) times daily with a meal.  Dispense: 60 tablet; Refill: 0  2. Obesity, Current BMI 37.3 Resa is currently in the action stage of change. As such, her goal is to continue with weight loss efforts. She has agreed to keeping a food journal and adhering to recommended goals of 1400-1500 calories and 100+ grams of protein daily.   We discussed various medication options to help Candace [name] with her weight loss efforts and we both agreed to continue Wegovy at 0.5 mg once weekly, and we will refill for 1  month.  - Semaglutide-Weight Management (WEGOVY) 0.5 MG/0.5ML SOAJ; Inject 0.5 mg into the skin once a week.  Dispense: 2 mL; Refill: 0  Exercise goals: As is.  Behavioral modification strategies: increasing vegetables.  Candace [name] has agreed to follow-up with our clinic in 3 weeks. She was informed of the importance of frequent follow-up visits to maximize her success with intensive lifestyle modifications for her multiple health conditions.   Objective:   Blood pressure (!) 143/81, pulse (!) 57, temperature 97.9 F (36.6 C), height '5\' 7"'$  (1.702 m), weight 238 lb (108 kg), SpO2 99 %. Body mass index is 37.28 kg/m.  General: Cooperative, alert, well developed, in no acute distress. HEENT: Conjunctivae and lids unremarkable. Cardiovascular: Regular rhythm.  Lungs: Normal work of breathing. Neurologic: No focal deficits.   Lab Results  Component Value Date   CREATININE 0.88 09/19/2021   BUN 13 09/19/2021   NA 141 09/19/2021   K 3.8 09/19/2021   CL 103 09/19/2021   CO2 31 09/19/2021   Lab Results  Component Value Date   ALT 19 09/18/2021   AST 13 09/18/2021   ALKPHOS 88 09/18/2021   BILITOT 0.2 09/18/2021   Lab Results  Component Value Date   HGBA1C 6.3 (H) 09/18/2021   HGBA1C 6.2 (H) 12/05/2020   HGBA1C 6.2 (H) 04/04/2020   HGBA1C 6.0 (H) 09/08/2019   HGBA1C 6.2 (H) 02/04/2019   Lab Results  Component Value Date   INSULIN 18.8 09/18/2021   INSULIN 13.5  12/05/2020   INSULIN 22.9 04/04/2020   INSULIN 12.6 09/08/2019   INSULIN 23.8 02/04/2019   Lab Results  Component Value Date   TSH 2.200 12/05/2020   Lab Results  Component Value Date   CHOL 145 09/18/2021   HDL 56 09/18/2021   LDLCALC 65 09/18/2021   TRIG 141 09/18/2021   CHOLHDL 3.9 04/04/2020   Lab Results  Component Value Date   VD25OH 71.0 09/18/2021   VD25OH 60.1 12/05/2020   VD25OH 41.2 04/04/2020   Lab Results  Component Value Date   WBC 7.1 04/15/2019   HGB 13.4 04/15/2019   HCT 41.4  04/15/2019   MCV 83.8 04/15/2019   PLT 297 04/15/2019   No results found for: "IRON", "TIBC", "FERRITIN"  Attestation Statements:   Reviewed by clinician on day of visit: allergies, medications, problem list, medical history, surgical history, family history, social history, and previous encounter notes.   I, Trixie Dredge, am acting as transcriptionist for Dennard Nip, MD.  I have reviewed the above documentation for accuracy and completeness, and I agree with the above. -  Dennard Nip, MD

## 2022-01-16 ENCOUNTER — Other Ambulatory Visit (HOSPITAL_COMMUNITY): Payer: Self-pay

## 2022-01-23 ENCOUNTER — Other Ambulatory Visit (HOSPITAL_COMMUNITY): Payer: Self-pay

## 2022-01-23 ENCOUNTER — Encounter (INDEPENDENT_AMBULATORY_CARE_PROVIDER_SITE_OTHER): Payer: Self-pay | Admitting: Family Medicine

## 2022-01-23 ENCOUNTER — Ambulatory Visit (INDEPENDENT_AMBULATORY_CARE_PROVIDER_SITE_OTHER): Payer: 59 | Admitting: Family Medicine

## 2022-01-23 VITALS — BP 159/84 | HR 65 | Temp 98.3°F | Ht 67.0 in | Wt 233.0 lb

## 2022-01-23 DIAGNOSIS — I10 Essential (primary) hypertension: Secondary | ICD-10-CM

## 2022-01-23 DIAGNOSIS — E1169 Type 2 diabetes mellitus with other specified complication: Secondary | ICD-10-CM | POA: Diagnosis not present

## 2022-01-23 DIAGNOSIS — E669 Obesity, unspecified: Secondary | ICD-10-CM | POA: Diagnosis not present

## 2022-01-23 DIAGNOSIS — Z6836 Body mass index (BMI) 36.0-36.9, adult: Secondary | ICD-10-CM | POA: Diagnosis not present

## 2022-01-23 DIAGNOSIS — Z7984 Long term (current) use of oral hypoglycemic drugs: Secondary | ICD-10-CM | POA: Diagnosis not present

## 2022-01-23 MED ORDER — WEGOVY 0.5 MG/0.5ML ~~LOC~~ SOAJ
0.5000 mg | SUBCUTANEOUS | 0 refills | Status: DC
  Filled 2022-01-23 – 2022-01-30 (×3): qty 2, 28d supply, fill #0

## 2022-01-23 MED ORDER — METFORMIN HCL 500 MG PO TABS
500.0000 mg | ORAL_TABLET | Freq: Two times a day (BID) | ORAL | 0 refills | Status: DC
  Filled 2022-01-23 – 2022-01-28 (×4): qty 60, 30d supply, fill #0

## 2022-01-25 ENCOUNTER — Other Ambulatory Visit (HOSPITAL_COMMUNITY): Payer: Self-pay

## 2022-01-28 ENCOUNTER — Other Ambulatory Visit (HOSPITAL_COMMUNITY): Payer: Self-pay

## 2022-01-30 ENCOUNTER — Other Ambulatory Visit (HOSPITAL_COMMUNITY): Payer: Self-pay

## 2022-01-30 NOTE — Progress Notes (Unsigned)
Chief Complaint:   OBESITY Candace [name] is here to discuss her progress with her obesity treatment plan along with follow-up of her obesity related diagnoses. Candace [name] is on keeping a food journal and adhering to recommended goals of 1400-1500 calories and 100+ grams of protein daily and states she is following her eating plan approximately 80% of the time. Candace [name] states she is doing 0 minutes 0 times per week.  Today's visit was #: 81 Starting weight: 249 lbs Starting date: 09/17/2017 Today's weight: 233 lbs Today's date: 01/23/2022 Total lbs lost to date: 16 Total lbs lost since last in-office visit: 5  Interim History: [name] continues to do well with weight loss.  She is doing better with hunger control, and she is back on Wegovy with minimal nausea.  Her daughter is going to college and she has been busy with packing.  Subjective:   1. Hypertension, unspecified type Candace [name]'s blood pressure is elevated today, and is worsening.  She increased her losartan from 25 mg to 50 mg approximately 1 to 2 months ago.  She can check her blood pressure at home.  2. Type 2 diabetes mellitus with other specified complication, without long-term current use of insulin ([name]) Candace [name] is stable on metformin and GLP-1.  She is working on her diet and weight loss.  She has no signs of hypoglycemia.  Assessment/Plan:   1. Hypertension, unspecified type Candace [name] is to check her blood pressure at home to see if this elevation in her blood pressure is persistent.  We will follow-up at her next visit in 3 weeks.  2. Type 2 diabetes mellitus with other specified complication, without long-term current use of insulin ([place]) Candace [name] will continue metformin 500 mg twice daily, and we will refill for 1 month.  - metFORMIN (GLUCOPHAGE) 500 MG tablet; Take 1 tablet (500 mg total) by mouth 2 (two) times daily with a meal.  Dispense: 60 tablet; Refill: 0  3. Obesity, Current BMI 36.6 [name] is currently in the action  stage of change. As such, her goal is to continue with weight loss efforts. She has agreed to keeping a food journal and adhering to recommended goals of 1400-1500 calories and 100+ grams of protein daily.   We will recheck fasting labs at her next visit.  We discussed various medication options to help Candace [name] with her weight loss efforts and we both agreed to continue Wegovy 0.5 mg once weekly, and we will refill for 1 month.  - Semaglutide-Weight Management (WEGOVY) 0.5 MG/0.5ML SOAJ; Inject 0.5 mg into the skin once a week.  Dispense: 2 mL; Refill: 0  Behavioral modification strategies: increasing lean protein intake and meal planning and cooking strategies.  Candace [name] has agreed to follow-up with our clinic in 3 weeks. She was informed of the importance of frequent follow-up visits to maximize her success with intensive lifestyle modifications for her multiple health conditions.   Objective:   Blood pressure (!) 159/84, pulse 65, temperature 98.3 F (36.8 C), height '5\' 7"'$  (1.702 m), weight 233 lb (105.7 kg), SpO2 94 %. Body mass index is 36.49 kg/m.  General: Cooperative, alert, well developed, in no acute distress. HEENT: Conjunctivae and lids unremarkable. Cardiovascular: Regular rhythm.  Lungs: Normal work of breathing. Neurologic: No focal deficits.   Lab Results  Component Value Date   CREATININE 0.88 09/19/2021   BUN 13 09/19/2021   NA 141 09/19/2021   K 3.8 09/19/2021   CL 103 09/19/2021   CO2 31 09/19/2021   Lab  Results  Component Value Date   ALT 19 09/18/2021   AST 13 09/18/2021   ALKPHOS 88 09/18/2021   BILITOT 0.2 09/18/2021   Lab Results  Component Value Date   HGBA1C 6.3 (H) 09/18/2021   HGBA1C 6.2 (H) 12/05/2020   HGBA1C 6.2 (H) 04/04/2020   HGBA1C 6.0 (H) 09/08/2019   HGBA1C 6.2 (H) 02/04/2019   Lab Results  Component Value Date   INSULIN 18.8 09/18/2021   INSULIN 13.5 12/05/2020   INSULIN 22.9 04/04/2020   INSULIN 12.6 09/08/2019   INSULIN 23.8  02/04/2019   Lab Results  Component Value Date   TSH 2.200 12/05/2020   Lab Results  Component Value Date   CHOL 145 09/18/2021   HDL 56 09/18/2021   LDLCALC 65 09/18/2021   TRIG 141 09/18/2021   CHOLHDL 3.9 04/04/2020   Lab Results  Component Value Date   VD25OH 71.0 09/18/2021   VD25OH 60.1 12/05/2020   VD25OH 41.2 04/04/2020   Lab Results  Component Value Date   WBC 7.1 04/15/2019   HGB 13.4 04/15/2019   HCT 41.4 04/15/2019   MCV 83.8 04/15/2019   PLT 297 04/15/2019   No results found for: "IRON", "TIBC", "FERRITIN"  Attestation Statements:   Reviewed by clinician on day of visit: allergies, medications, problem list, medical history, surgical history, family history, social history, and previous encounter notes.   I, Trixie Dredge, am acting as transcriptionist for Dennard Nip, MD.  I have reviewed the above documentation for accuracy and completeness, and I agree with the above. -  Dennard Nip, MD

## 2022-01-31 ENCOUNTER — Encounter (INDEPENDENT_AMBULATORY_CARE_PROVIDER_SITE_OTHER): Payer: Self-pay

## 2022-01-31 ENCOUNTER — Telehealth (INDEPENDENT_AMBULATORY_CARE_PROVIDER_SITE_OTHER): Payer: Self-pay | Admitting: Family Medicine

## 2022-01-31 ENCOUNTER — Other Ambulatory Visit (HOSPITAL_COMMUNITY): Payer: Self-pay

## 2022-01-31 NOTE — Telephone Encounter (Signed)
Dr. Leafy Ro - Prior authorization approved for [facility]. Effective: 11/21/2021 to 11/21/2022. Patient sent approval message via mychart.

## 2022-02-01 ENCOUNTER — Other Ambulatory Visit (HOSPITAL_COMMUNITY): Payer: Self-pay

## 2022-02-06 ENCOUNTER — Encounter (INDEPENDENT_AMBULATORY_CARE_PROVIDER_SITE_OTHER): Payer: Self-pay

## 2022-02-20 ENCOUNTER — Ambulatory Visit (INDEPENDENT_AMBULATORY_CARE_PROVIDER_SITE_OTHER): Payer: 59 | Admitting: Family Medicine

## 2022-02-20 ENCOUNTER — Other Ambulatory Visit (HOSPITAL_COMMUNITY): Payer: Self-pay

## 2022-02-20 ENCOUNTER — Encounter (INDEPENDENT_AMBULATORY_CARE_PROVIDER_SITE_OTHER): Payer: Self-pay | Admitting: Family Medicine

## 2022-02-20 VITALS — BP 146/76 | HR 81 | Temp 98.2°F | Ht 67.0 in | Wt 228.0 lb

## 2022-02-20 DIAGNOSIS — E559 Vitamin D deficiency, unspecified: Secondary | ICD-10-CM | POA: Diagnosis not present

## 2022-02-20 DIAGNOSIS — E785 Hyperlipidemia, unspecified: Secondary | ICD-10-CM

## 2022-02-20 DIAGNOSIS — E669 Obesity, unspecified: Secondary | ICD-10-CM | POA: Diagnosis not present

## 2022-02-20 DIAGNOSIS — Z6835 Body mass index (BMI) 35.0-35.9, adult: Secondary | ICD-10-CM

## 2022-02-20 DIAGNOSIS — Z7985 Long-term (current) use of injectable non-insulin antidiabetic drugs: Secondary | ICD-10-CM | POA: Diagnosis not present

## 2022-02-20 DIAGNOSIS — E1169 Type 2 diabetes mellitus with other specified complication: Secondary | ICD-10-CM

## 2022-02-20 MED ORDER — WEGOVY 0.5 MG/0.5ML ~~LOC~~ SOAJ
0.5000 mg | SUBCUTANEOUS | 0 refills | Status: DC
  Filled 2022-02-20 – 2022-02-24 (×2): qty 2, 28d supply, fill #0

## 2022-02-21 LAB — LIPID PANEL WITH LDL/HDL RATIO
Cholesterol, Total: 151 mg/dL (ref 100–199)
HDL: 53 mg/dL (ref >39)
LDL Chol Calc (NIH): 68 mg/dL (ref 0–99)
LDL/HDL Ratio: 1.3 ratio (ref 0.0–3.2)
Triglycerides: 182 mg/dL — ABNORMAL HIGH (ref 0–149)
VLDL Cholesterol Cal: 30 mg/dL (ref 5–40)

## 2022-02-21 LAB — CMP14+EGFR
ALT: 14 IU/L (ref 0–32)
AST: 12 IU/L (ref 0–40)
Albumin/Globulin Ratio: 2.3 — ABNORMAL HIGH (ref 1.2–2.2)
Albumin: 4.6 g/dL (ref 3.8–4.9)
Alkaline Phosphatase: 85 IU/L (ref 44–121)
BUN/Creatinine Ratio: 16 (ref 12–28)
BUN: 12 mg/dL (ref 8–27)
Bilirubin Total: 0.3 mg/dL (ref 0.0–1.2)
CO2: 26 mmol/L (ref 20–29)
Calcium: 9.9 mg/dL (ref 8.7–10.3)
Chloride: 99 mmol/L (ref 96–106)
Creatinine, Ser: 0.76 mg/dL (ref 0.57–1.00)
Globulin, Total: 2 g/dL (ref 1.5–4.5)
Glucose: 94 mg/dL (ref 70–99)
Potassium: 4.1 mmol/L (ref 3.5–5.2)
Sodium: 138 mmol/L (ref 134–144)
Total Protein: 6.6 g/dL (ref 6.0–8.5)
eGFR: 90 mL/min/{1.73_m2} (ref >59)

## 2022-02-21 LAB — INSULIN, RANDOM: INSULIN: 11.7 u[IU]/mL (ref 2.6–24.9)

## 2022-02-21 LAB — HEMOGLOBIN A1C
Est. average glucose Bld gHb Est-mCnc: 123 mg/dL
Hgb A1c MFr Bld: 5.9 % — ABNORMAL HIGH (ref 4.8–5.6)

## 2022-02-21 LAB — VITAMIN D 25 HYDROXY (VIT D DEFICIENCY, FRACTURES): Vit D, 25-Hydroxy: 62.2 ng/mL (ref 30.0–100.0)

## 2022-02-25 ENCOUNTER — Other Ambulatory Visit (HOSPITAL_COMMUNITY): Payer: Self-pay

## 2022-02-28 ENCOUNTER — Other Ambulatory Visit (HOSPITAL_COMMUNITY): Payer: Self-pay

## 2022-02-28 NOTE — Progress Notes (Signed)
Chief Complaint:   OBESITY Candace [name] is here to discuss her progress with her obesity treatment plan along with follow-up of her obesity related diagnoses. Candace [name] is on keeping a food journal and adhering to recommended goals of 1400-1500 calories and 100+ grams of protein daily and states she is following her eating plan approximately 75-80% of the time. Candace [name] states she is doing 0 minutes 0 times per week.  Today's visit was #: 81 Starting weight: 249 lbs Starting date: 09/17/2017 Today's weight: 228 lbs Today's date: 02/20/2022 Total lbs lost to date: 21 Total lbs lost since last in-office visit: 5  Interim History: Candace [name] is doing very well with weight loss, and she has no side effects from [facility].  She is working on meal planning and getting ready to look into starting pickleball.  Subjective:   1. Hyperlipidemia, unspecified hyperlipidemia type Candace [name] is on Crestor and she is working on decreasing cholesterol in her diet.  She denies chest pain or myalgias.  2. Type 2 diabetes mellitus with other specified complication, without long-term current use of insulin (HCC) Candace [name] is working on her diet and weight loss.  She has no problems with her medications and she is due for labs.  3. Vitamin D deficiency Candace [name] is on vitamin D and she is due for labs.  She denies nausea, vomiting, or muscle weakness.  Assessment/Plan:   1. Hyperlipidemia, unspecified hyperlipidemia type We will check labs today. Candace [name] will continue to work on diet, exercise and weight loss efforts. Orders and follow up as documented in patient record.   - Lipid Panel With LDL/HDL Ratio  2. Type 2 diabetes mellitus with other specified complication, without long-term current use of insulin (HCC) We will check labs today. Good blood sugar control is important to decrease the likelihood of diabetic complications such as nephropathy, neuropathy, limb loss, blindness, coronary artery disease, and death.  Intensive lifestyle modification including diet, exercise and weight loss are the first line of treatment for diabetes.   - CMP14+EGFR - Insulin, random - Hemoglobin A1c  3. Vitamin D deficiency We will check labs today. Candace [name] will follow-up for routine testing of Vitamin D, at least 2-3 times per year to avoid over-replacement.  - VITAMIN D 25 Hydroxy (Vit-D Deficiency, Fractures)  4. Obesity, Current BMI 35.8 Candace [name] is currently in the action stage of change. As such, her goal is to continue with weight loss efforts. She has agreed to keeping a food journal and adhering to recommended goals of 1400-1500 calories and 100+ grams of protein daily.   We discussed various medication options to help Candace [name] with her weight loss efforts and we both agreed to continue Wegovy 0.5 mg once weekly, and we will refill for 1 month.  - Semaglutide-Weight Management (WEGOVY) 0.5 MG/0.5ML SOAJ; Inject 0.5 mg into the skin once a week.  Dispense: 2 mL; Refill: 0  Behavioral modification strategies: increasing lean protein intake and meal planning and cooking strategies.  Candace [name] has agreed to follow-up with our clinic in 4 weeks. She was informed of the importance of frequent follow-up visits to maximize her success with intensive lifestyle modifications for her multiple health conditions.   Candace [name] was informed we would discuss her lab results at her next visit unless there is a critical issue that needs to be addressed sooner. Candace [name] agreed to keep her next visit at the agreed upon time to discuss these results.  Objective:   Blood pressure (!) 146/76, pulse 81, temperature 98.2 F (36.8  C), height '5\' 7"'  (1.702 m), weight 228 lb (103.4 kg), SpO2 96 %. Body mass index is 35.71 kg/m.  General: Cooperative, alert, well developed, in no acute distress. HEENT: Conjunctivae and lids unremarkable. Cardiovascular: Regular rhythm.  Lungs: Normal work of breathing. Neurologic: No focal deficits.   Lab  Results  Component Value Date   CREATININE 0.76 02/20/2022   BUN 12 02/20/2022   NA 138 02/20/2022   K 4.1 02/20/2022   CL 99 02/20/2022   CO2 26 02/20/2022   Lab Results  Component Value Date   ALT 14 02/20/2022   AST 12 02/20/2022   ALKPHOS 85 02/20/2022   BILITOT 0.3 02/20/2022   Lab Results  Component Value Date   HGBA1C 5.9 (H) 02/20/2022   HGBA1C 6.3 (H) 09/18/2021   HGBA1C 6.2 (H) 12/05/2020   HGBA1C 6.2 (H) 04/04/2020   HGBA1C 6.0 (H) 09/08/2019   Lab Results  Component Value Date   INSULIN 11.7 02/20/2022   INSULIN 18.8 09/18/2021   INSULIN 13.5 12/05/2020   INSULIN 22.9 04/04/2020   INSULIN 12.6 09/08/2019   Lab Results  Component Value Date   TSH 2.200 12/05/2020   Lab Results  Component Value Date   CHOL 151 02/20/2022   HDL 53 02/20/2022   LDLCALC 68 02/20/2022   TRIG 182 (H) 02/20/2022   CHOLHDL 3.9 04/04/2020   Lab Results  Component Value Date   VD25OH 62.2 02/20/2022   VD25OH 71.0 09/18/2021   VD25OH 60.1 12/05/2020   Lab Results  Component Value Date   WBC 7.1 04/15/2019   HGB 13.4 04/15/2019   HCT 41.4 04/15/2019   MCV 83.8 04/15/2019   PLT 297 04/15/2019   No results found for: "IRON", "TIBC", "FERRITIN"  Attestation Statements:   Reviewed by clinician on day of visit: allergies, medications, problem list, medical history, surgical history, family history, social history, and previous encounter notes.   I, Trixie Dredge, am acting as transcriptionist for Dennard Nip, MD.  I have reviewed the above documentation for accuracy and completeness, and I agree with the above. -  Dennard Nip, MD

## 2022-03-14 ENCOUNTER — Other Ambulatory Visit (HOSPITAL_COMMUNITY): Payer: Self-pay

## 2022-03-14 ENCOUNTER — Other Ambulatory Visit (INDEPENDENT_AMBULATORY_CARE_PROVIDER_SITE_OTHER): Payer: Self-pay | Admitting: Family Medicine

## 2022-03-14 DIAGNOSIS — E1169 Type 2 diabetes mellitus with other specified complication: Secondary | ICD-10-CM

## 2022-03-15 ENCOUNTER — Other Ambulatory Visit (HOSPITAL_COMMUNITY): Payer: Self-pay

## 2022-03-19 ENCOUNTER — Other Ambulatory Visit (HOSPITAL_COMMUNITY): Payer: Self-pay

## 2022-03-19 DIAGNOSIS — G4733 Obstructive sleep apnea (adult) (pediatric): Secondary | ICD-10-CM | POA: Diagnosis not present

## 2022-03-19 DIAGNOSIS — I10 Essential (primary) hypertension: Secondary | ICD-10-CM | POA: Diagnosis not present

## 2022-03-19 DIAGNOSIS — G2581 Restless legs syndrome: Secondary | ICD-10-CM | POA: Diagnosis not present

## 2022-03-19 DIAGNOSIS — E559 Vitamin D deficiency, unspecified: Secondary | ICD-10-CM | POA: Diagnosis not present

## 2022-03-19 DIAGNOSIS — Z23 Encounter for immunization: Secondary | ICD-10-CM | POA: Diagnosis not present

## 2022-03-19 DIAGNOSIS — Z1159 Encounter for screening for other viral diseases: Secondary | ICD-10-CM | POA: Diagnosis not present

## 2022-03-19 DIAGNOSIS — R7303 Prediabetes: Secondary | ICD-10-CM | POA: Diagnosis not present

## 2022-03-19 DIAGNOSIS — Z0000 Encounter for general adult medical examination without abnormal findings: Secondary | ICD-10-CM | POA: Diagnosis not present

## 2022-03-19 DIAGNOSIS — F3341 Major depressive disorder, recurrent, in partial remission: Secondary | ICD-10-CM | POA: Diagnosis not present

## 2022-03-19 DIAGNOSIS — E785 Hyperlipidemia, unspecified: Secondary | ICD-10-CM | POA: Diagnosis not present

## 2022-03-19 DIAGNOSIS — K219 Gastro-esophageal reflux disease without esophagitis: Secondary | ICD-10-CM | POA: Diagnosis not present

## 2022-03-19 DIAGNOSIS — Z79899 Other long term (current) drug therapy: Secondary | ICD-10-CM | POA: Diagnosis not present

## 2022-03-19 MED ORDER — ESOMEPRAZOLE MAGNESIUM 40 MG PO CPDR
40.0000 mg | DELAYED_RELEASE_CAPSULE | Freq: Every day | ORAL | 3 refills | Status: DC
  Filled 2022-03-19: qty 90, 90d supply, fill #0
  Filled 2022-07-26: qty 90, 90d supply, fill #1

## 2022-03-19 MED ORDER — BUPROPION HCL ER (XL) 300 MG PO TB24
300.0000 mg | ORAL_TABLET | Freq: Every morning | ORAL | 1 refills | Status: DC
  Filled 2022-03-19: qty 90, 90d supply, fill #0
  Filled 2022-07-26: qty 90, 90d supply, fill #1

## 2022-03-19 MED ORDER — TRIAMTERENE-HCTZ 37.5-25 MG PO TABS
1.0000 | ORAL_TABLET | Freq: Every day | ORAL | 1 refills | Status: DC
  Filled 2022-04-29: qty 90, 90d supply, fill #0
  Filled 2022-08-20: qty 90, 90d supply, fill #1

## 2022-03-19 MED ORDER — ROPINIROLE HCL 1 MG PO TABS
1.0000 mg | ORAL_TABLET | Freq: Every day | ORAL | 3 refills | Status: DC
  Filled 2022-04-24: qty 90, 90d supply, fill #0
  Filled 2022-07-26: qty 90, 90d supply, fill #1
  Filled 2022-10-23: qty 90, 90d supply, fill #2
  Filled 2023-01-17: qty 90, 90d supply, fill #3

## 2022-03-19 MED ORDER — ROSUVASTATIN CALCIUM 10 MG PO TABS
10.0000 mg | ORAL_TABLET | Freq: Every evening | ORAL | 3 refills | Status: DC
  Filled 2022-06-19: qty 90, 90d supply, fill #0
  Filled 2022-09-18: qty 90, 90d supply, fill #1

## 2022-03-20 ENCOUNTER — Encounter (INDEPENDENT_AMBULATORY_CARE_PROVIDER_SITE_OTHER): Payer: Self-pay | Admitting: *Deleted

## 2022-03-20 ENCOUNTER — Telehealth (INDEPENDENT_AMBULATORY_CARE_PROVIDER_SITE_OTHER): Payer: Self-pay | Admitting: Family Medicine

## 2022-03-20 NOTE — Telephone Encounter (Signed)
09/20 Patient Metformin was denied refill and Patient is Calling to figure out [email or web address]

## 2022-03-20 NOTE — Telephone Encounter (Signed)
Patient sent mychart message in reference to this.

## 2022-03-21 ENCOUNTER — Other Ambulatory Visit (HOSPITAL_COMMUNITY): Payer: Self-pay

## 2022-03-21 MED ORDER — FERROUS SULFATE 325 (65 FE) MG PO TABS
325.0000 mg | ORAL_TABLET | Freq: Every day | ORAL | 3 refills | Status: DC
  Filled 2022-03-21: qty 90, 90d supply, fill #0

## 2022-03-26 ENCOUNTER — Other Ambulatory Visit (HOSPITAL_COMMUNITY): Payer: Self-pay

## 2022-03-26 ENCOUNTER — Ambulatory Visit (INDEPENDENT_AMBULATORY_CARE_PROVIDER_SITE_OTHER): Payer: 59 | Admitting: Family Medicine

## 2022-03-26 ENCOUNTER — Encounter (INDEPENDENT_AMBULATORY_CARE_PROVIDER_SITE_OTHER): Payer: Self-pay | Admitting: Family Medicine

## 2022-03-26 VITALS — BP 153/73 | HR 104 | Temp 98.0°F | Ht 67.0 in | Wt 226.0 lb

## 2022-03-26 DIAGNOSIS — E1169 Type 2 diabetes mellitus with other specified complication: Secondary | ICD-10-CM

## 2022-03-26 DIAGNOSIS — I10 Essential (primary) hypertension: Secondary | ICD-10-CM

## 2022-03-26 DIAGNOSIS — Z7984 Long term (current) use of oral hypoglycemic drugs: Secondary | ICD-10-CM | POA: Diagnosis not present

## 2022-03-26 DIAGNOSIS — E669 Obesity, unspecified: Secondary | ICD-10-CM | POA: Diagnosis not present

## 2022-03-26 DIAGNOSIS — F3289 Other specified depressive episodes: Secondary | ICD-10-CM

## 2022-03-26 DIAGNOSIS — D508 Other iron deficiency anemias: Secondary | ICD-10-CM | POA: Diagnosis not present

## 2022-03-26 DIAGNOSIS — D649 Anemia, unspecified: Secondary | ICD-10-CM | POA: Insufficient documentation

## 2022-03-26 DIAGNOSIS — Z6835 Body mass index (BMI) 35.0-35.9, adult: Secondary | ICD-10-CM

## 2022-03-26 MED ORDER — METFORMIN HCL 500 MG PO TABS
500.0000 mg | ORAL_TABLET | Freq: Two times a day (BID) | ORAL | 0 refills | Status: DC
  Filled 2022-03-26: qty 60, 30d supply, fill #0

## 2022-03-26 MED ORDER — WEGOVY 0.5 MG/0.5ML ~~LOC~~ SOAJ
0.5000 mg | SUBCUTANEOUS | 0 refills | Status: DC
  Filled 2022-03-26: qty 2, 28d supply, fill #0

## 2022-03-27 ENCOUNTER — Other Ambulatory Visit (HOSPITAL_COMMUNITY): Payer: Self-pay

## 2022-03-28 NOTE — Progress Notes (Signed)
Chief Complaint:   OBESITY Candace [name] is here to discuss her progress with her obesity treatment plan along with follow-up of her obesity related diagnoses. Candace [name] is on keeping a food journal and adhering to recommended goals of 1400-1500 calories and 100+ grams of protein daily and states she is following her eating plan approximately 75% of the time. Candace [name] states she is walking for 20-30 minutes 4 times per week.  Today's visit was #: 56 Starting weight: 249 lbs Starting date: 09/17/2017 Today's weight: 226 lbs Today's date: 03/26/2022 Total lbs lost to date: 23 Total lbs lost since last in-office visit: 2  Interim History: Candace [name] is doing well with weight loss. She traveled a lot over the past few months. She has done well with avoiding fast food, and she is working on meal planning. She feels Candace [name] is helping with simple carbohydrate cravings.   Subjective:   1. Type 2 diabetes mellitus with other specified complication, without long-term current use of insulin (Candace [name]) Candace [name] is tolerating metformin well with no side effects noted.   2. Hypertension, unspecified type Candace [name]'s blood pressure is at goal on her medications at home. Her blood pressure is elevated today in the office and she felt this to be white coat hypertension.   3. Other iron deficiency anemia Candace [name] was started on ferrous sulfate per her PCP.   4. Other depression, with emotional eating Candace [name] is doing well with Wellbutrin XL with no side effects noted. She feels it is helping with cravings. Her PCP stopped Prozac as she felt to possibly be causing increased restless leg syndrome.   Assessment/Plan:   1. Type 2 diabetes mellitus with other specified complication, without long-term current use of insulin (Candace [name]) Candace [name] will continue metformin, diet, and exercise, and we will refill metformin for 1 month.  - metFORMIN (GLUCOPHAGE) 500 MG tablet; Take 1 tablet (500 mg total) by mouth 2 (two) times daily with a  meal.  Dispense: 60 tablet; Refill: 0  2. Hypertension, unspecified type Candace [name] will continue Candace [name] and Candace [name], and her diet and exercise.   3. Other iron deficiency anemia Candace [name] will continue ferrous sulfate per her PCP.   4. Other depression, with emotional eating Candace [name] will continue Wellbutrin XL, diet, and exercise.   5. Obesity, Current BMI 35.4 Candace [name] is currently in the action stage of change. As such, her goal is to continue with weight loss efforts. She has agreed to keeping a food journal and adhering to recommended goals of 1400-1500 calories and 100+ grams of protein daily.   We discussed various medication options to help Candace [name] with her weight loss efforts and we both agreed to continue Candace [name] 0.5 mg once weekly, and we will refill for 1 month.  - Semaglutide-Weight Management (Candace [name]) 0.5 MG/0.5ML SOAJ; Inject 0.5 mg into the skin once a week.  Dispense: 2 mL; Refill: 0  Exercise goals: As is.   Behavioral modification strategies: increasing lean protein intake, decreasing simple carbohydrates, meal planning and cooking strategies, and better snacking choices.  Candace [name] has agreed to follow-up with our clinic in 4 weeks. She was informed of the importance of frequent follow-up visits to maximize her success with intensive lifestyle modifications for her multiple health conditions.   Objective:   Blood pressure (!) 153/73, pulse (!) 104, temperature 98 F (36.7 C), height '5\' 7"'$  (1.702 m), weight 226 lb (102.5 kg), SpO2 92 %. Body mass index is 35.4 kg/m.  General: Cooperative, alert, well developed, in no acute distress. HEENT:  Conjunctivae and lids unremarkable. Cardiovascular: Regular rhythm.  Lungs: Normal work of breathing. Neurologic: No focal deficits.   Lab Results  Component Value Date   CREATININE 0.76 02/20/2022   BUN 12 02/20/2022   NA 138 02/20/2022   K 4.1 02/20/2022   CL 99 02/20/2022   CO2 26 02/20/2022   Lab Results  Component Value Date    ALT 14 02/20/2022   AST 12 02/20/2022   ALKPHOS 85 02/20/2022   BILITOT 0.3 02/20/2022   Lab Results  Component Value Date   HGBA1C 5.9 (H) 02/20/2022   HGBA1C 6.3 (H) 09/18/2021   HGBA1C 6.2 (H) 12/05/2020   HGBA1C 6.2 (H) 04/04/2020   HGBA1C 6.0 (H) 09/08/2019   Lab Results  Component Value Date   INSULIN 11.7 02/20/2022   INSULIN 18.8 09/18/2021   INSULIN 13.5 12/05/2020   INSULIN 22.9 04/04/2020   INSULIN 12.6 09/08/2019   Lab Results  Component Value Date   TSH 2.200 12/05/2020   Lab Results  Component Value Date   CHOL 151 02/20/2022   HDL 53 02/20/2022   LDLCALC 68 02/20/2022   TRIG 182 (H) 02/20/2022   CHOLHDL 3.9 04/04/2020   Lab Results  Component Value Date   VD25OH 62.2 02/20/2022   VD25OH 71.0 09/18/2021   VD25OH 60.1 12/05/2020   Lab Results  Component Value Date   WBC 7.1 04/15/2019   HGB 13.4 04/15/2019   HCT 41.4 04/15/2019   MCV 83.8 04/15/2019   PLT 297 04/15/2019   No results found for: "IRON", "TIBC", "FERRITIN"  Attestation Statements:   Reviewed by clinician on day of visit: allergies, medications, problem list, medical history, surgical history, family history, social history, and previous encounter notes.  I have personally spent 40 minutes total time today in preparation, patient care, and documentation for this visit, including the following: review of clinical lab tests; review of medical tests/procedures/services.   I, Trixie Dredge, am acting as transcriptionist for Dennard Nip, MD.  I have reviewed the above documentation for accuracy and completeness, and I agree with the above. -  Dennard Nip, MD

## 2022-04-23 ENCOUNTER — Other Ambulatory Visit (HOSPITAL_COMMUNITY): Payer: Self-pay

## 2022-04-23 ENCOUNTER — Ambulatory Visit (INDEPENDENT_AMBULATORY_CARE_PROVIDER_SITE_OTHER): Payer: 59 | Admitting: Family Medicine

## 2022-04-23 ENCOUNTER — Encounter (INDEPENDENT_AMBULATORY_CARE_PROVIDER_SITE_OTHER): Payer: Self-pay | Admitting: Family Medicine

## 2022-04-23 VITALS — BP 138/84 | HR 82 | Temp 98.0°F | Ht 67.0 in | Wt 221.0 lb

## 2022-04-23 DIAGNOSIS — E1169 Type 2 diabetes mellitus with other specified complication: Secondary | ICD-10-CM

## 2022-04-23 DIAGNOSIS — Z6834 Body mass index (BMI) 34.0-34.9, adult: Secondary | ICD-10-CM | POA: Diagnosis not present

## 2022-04-23 DIAGNOSIS — E669 Obesity, unspecified: Secondary | ICD-10-CM | POA: Diagnosis not present

## 2022-04-23 DIAGNOSIS — E785 Hyperlipidemia, unspecified: Secondary | ICD-10-CM

## 2022-04-23 DIAGNOSIS — Z7984 Long term (current) use of oral hypoglycemic drugs: Secondary | ICD-10-CM | POA: Diagnosis not present

## 2022-04-23 DIAGNOSIS — Z7985 Long-term (current) use of injectable non-insulin antidiabetic drugs: Secondary | ICD-10-CM

## 2022-04-23 MED ORDER — WEGOVY 0.5 MG/0.5ML ~~LOC~~ SOAJ
0.5000 mg | SUBCUTANEOUS | 0 refills | Status: DC
  Filled 2022-04-23: qty 2, 28d supply, fill #0

## 2022-04-23 MED ORDER — METFORMIN HCL 500 MG PO TABS
500.0000 mg | ORAL_TABLET | Freq: Two times a day (BID) | ORAL | 0 refills | Status: DC
  Filled 2022-04-23: qty 60, 30d supply, fill #0

## 2022-04-24 ENCOUNTER — Other Ambulatory Visit (HOSPITAL_COMMUNITY): Payer: Self-pay

## 2022-04-29 ENCOUNTER — Other Ambulatory Visit (HOSPITAL_COMMUNITY): Payer: Self-pay

## 2022-04-30 NOTE — Progress Notes (Signed)
Chief Complaint:   OBESITY Candace [name] is here to discuss her progress with her obesity treatment plan along with follow-up of her obesity related diagnoses. Candace [name] is on keeping a food journal and adhering to recommended goals of 1400-1500 calories and 100+ grams of protein daily and states she is following her eating plan approximately 75% of the time. Candace [name] states she is doing 0 minutes 0 times per week.  Today's visit was #: 25 Starting weight: 249 lbs Starting date: 09/17/2017 Today's weight: 221 lbs Today's date: 04/23/2022 Total lbs lost to date: 28 Total lbs lost since last in-office visit: 5  Interim History: Candace [name] is doing well with weight loss.  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  She is taking Wegovy with no side effects noted.  She reports no excessive hunger.  She has a vacation and wedding celebration coming up, and she is going to [place], [place] and then to [place].  Subjective:   1. Type 2 diabetes mellitus with other specified complication, without long-term current use of insulin (HCC) Candace [name] is taking metformin 500 mg twice daily.  Her last A1c was 5.9 and insulin 11.7.  She is taking GLP-1 with no side effects noted with Wegovy or metformin.  2. Hyperlipidemia, unspecified hyperlipidemia type Candace [name]'s last triglycerides were 182, LDL 58, and HDL 53.  She is taking Crestor with no side effects noted.  We discussed effects of simple carbohydrates on triglycerides.  Assessment/Plan:   1. Type 2 diabetes mellitus with other specified complication, without long-term current use of insulin (HCC) Candace [name] will continue her medications, and will continue with her healthy eating plan.  We will refill metformin for 1 month.  - metFORMIN (GLUCOPHAGE) 500 MG tablet; Take 1 tablet (500 mg total) by mouth 2 (two) times daily with a meal.  Dispense: 60 tablet; Refill: 0  2. Hyperlipidemia, unspecified hyperlipidemia type Candace [name] will continue  Crestor, and will continue with her healthy eating plan and exercise.  3. Obesity, Current BMI 34.7 Candace [name] is currently in the action stage of change. As such, her goal is to continue with weight loss efforts. She has agreed to keeping a food journal and adhering to recommended goals of 1400-1500 calories and 100+ grams of protein daily.   Handout was provided on better fruit options.  We discussed various medication options to help Candace [name] with her weight loss efforts and we both agreed to continue Wegovy 0.5 mg once weekly, and we will refill for 1 month.  - Semaglutide-Weight Management (WEGOVY) 0.5 MG/0.5ML SOAJ; Inject 0.5 mg into the skin once a week.  Dispense: 2 mL; Refill: 0  Exercise goals: All adults should avoid inactivity. Some physical activity is better than none, and adults who participate in any amount of physical activity gain some health benefits.  Behavioral modification strategies: increasing lean protein intake, decreasing simple carbohydrates, meal planning and cooking strategies, keeping healthy foods in the home, and planning for success.  Antonieta has agreed to follow-up with our clinic in 4 weeks. She was informed of the importance of frequent follow-up visits to maximize her success with intensive lifestyle modifications for her multiple health conditions.   Objective:   Blood pressure 138/84, pulse 82, temperature 98 F (36.7 C), height '5\' 7"'$  (1.702 m), weight 221 lb (100.2 kg), SpO2 99 %. Body mass index is 34.61 kg/m.  General: Cooperative, alert, well developed, in no acute distress. HEENT: Conjunctivae and lids unremarkable. Cardiovascular: Regular rhythm.  Lungs: Normal work  of breathing. Neurologic: No focal deficits.   Lab Results  Component Value Date   CREATININE 0.76 02/20/2022   BUN 12 02/20/2022   NA 138 02/20/2022   K 4.1 02/20/2022   CL 99 02/20/2022   CO2 26 02/20/2022   Lab Results  Component Value Date   ALT 14 02/20/2022   AST 12  02/20/2022   ALKPHOS 85 02/20/2022   BILITOT 0.3 02/20/2022   Lab Results  Component Value Date   HGBA1C 5.9 (H) 02/20/2022   HGBA1C 6.3 (H) 09/18/2021   HGBA1C 6.2 (H) 12/05/2020   HGBA1C 6.2 (H) 04/04/2020   HGBA1C 6.0 (H) 09/08/2019   Lab Results  Component Value Date   INSULIN 11.7 02/20/2022   INSULIN 18.8 09/18/2021   INSULIN 13.5 12/05/2020   INSULIN 22.9 04/04/2020   INSULIN 12.6 09/08/2019   Lab Results  Component Value Date   TSH 2.200 12/05/2020   Lab Results  Component Value Date   CHOL 151 02/20/2022   HDL 53 02/20/2022   LDLCALC 68 02/20/2022   TRIG 182 (H) 02/20/2022   CHOLHDL 3.9 04/04/2020   Lab Results  Component Value Date   VD25OH 62.2 02/20/2022   VD25OH 71.0 09/18/2021   VD25OH 60.1 12/05/2020   Lab Results  Component Value Date   WBC 7.1 04/15/2019   HGB 13.4 04/15/2019   HCT 41.4 04/15/2019   MCV 83.8 04/15/2019   PLT 297 04/15/2019   No results found for: "IRON", "TIBC", "FERRITIN"  Attestation Statements:   Reviewed by clinician on day of visit: allergies, medications, problem list, medical history, surgical history, family history, social history, and previous encounter notes.   I, Trixie Dredge, am acting as transcriptionist for Dennard Nip, MD.  I have reviewed the above documentation for accuracy and completeness, and I agree with the above. -  Dennard Nip, MD

## 2022-05-20 ENCOUNTER — Other Ambulatory Visit (HOSPITAL_COMMUNITY): Payer: Self-pay

## 2022-05-22 ENCOUNTER — Ambulatory Visit (INDEPENDENT_AMBULATORY_CARE_PROVIDER_SITE_OTHER): Payer: 59 | Admitting: Family Medicine

## 2022-05-27 ENCOUNTER — Other Ambulatory Visit (HOSPITAL_COMMUNITY): Payer: Self-pay

## 2022-05-30 ENCOUNTER — Ambulatory Visit (INDEPENDENT_AMBULATORY_CARE_PROVIDER_SITE_OTHER): Payer: 59 | Admitting: Family Medicine

## 2022-06-13 ENCOUNTER — Ambulatory Visit: Payer: 59 | Admitting: Internal Medicine

## 2022-06-19 ENCOUNTER — Encounter (INDEPENDENT_AMBULATORY_CARE_PROVIDER_SITE_OTHER): Payer: Self-pay | Admitting: Family Medicine

## 2022-06-19 ENCOUNTER — Ambulatory Visit (INDEPENDENT_AMBULATORY_CARE_PROVIDER_SITE_OTHER): Payer: 59 | Admitting: Family Medicine

## 2022-06-19 ENCOUNTER — Other Ambulatory Visit (HOSPITAL_COMMUNITY): Payer: Self-pay

## 2022-06-19 VITALS — BP 159/85 | HR 102 | Temp 98.9°F | Ht 67.0 in | Wt 237.0 lb

## 2022-06-19 DIAGNOSIS — Z7984 Long term (current) use of oral hypoglycemic drugs: Secondary | ICD-10-CM

## 2022-06-19 DIAGNOSIS — E1169 Type 2 diabetes mellitus with other specified complication: Secondary | ICD-10-CM | POA: Diagnosis not present

## 2022-06-19 DIAGNOSIS — Z6837 Body mass index (BMI) 37.0-37.9, adult: Secondary | ICD-10-CM

## 2022-06-19 DIAGNOSIS — E669 Obesity, unspecified: Secondary | ICD-10-CM

## 2022-06-19 DIAGNOSIS — K591 Functional diarrhea: Secondary | ICD-10-CM | POA: Diagnosis not present

## 2022-06-19 MED ORDER — TIRZEPATIDE 5 MG/0.5ML ~~LOC~~ SOAJ
5.0000 mg | SUBCUTANEOUS | 0 refills | Status: DC
  Filled 2022-06-19: qty 2, 28d supply, fill #0

## 2022-06-19 MED ORDER — METFORMIN HCL 500 MG PO TABS
500.0000 mg | ORAL_TABLET | Freq: Two times a day (BID) | ORAL | 0 refills | Status: DC
  Filled 2022-06-19: qty 60, 30d supply, fill #0

## 2022-07-09 NOTE — Progress Notes (Signed)
Chief Complaint:   OBESITY Candace [name] is here to discuss her progress with her obesity treatment plan along with follow-up of her obesity related diagnoses. Candace [name] is on keeping a food journal and adhering to recommended goals of 1400-1500 calories and 100+ grams of protein and states she is following her eating plan approximately 50% of the time. Candace [name] states she is doing 0 minutes 0 times per week.  Today's visit was #: 62 Starting weight: 249 lbs Starting date: 09/17/2017 Today's weight: 237 lbs Today's date: 06/19/2022 Total lbs lost to date: 12 Total lbs lost since last in-office visit: 0  Interim History: Candace [name] has gotten off track with her weight loss efforts.  She is struggling with hunger issues and she is less active.  She is working on getting back on track.  Subjective:   1. Type 2 diabetes mellitus with other specified complication, without long-term current use of insulin (HCC) Candace [name] has consistent diarrhea on metformin.  She has been out of her GLP-1.  2. Functional diarrhea Candace [name] notes consistent diarrhea on metformin, which improved when she ran out 2 weeks ago.  Assessment/Plan:   1. Type 2 diabetes mellitus with other specified complication, without long-term current use of insulin (Candace [name]) Candace [name] agreed to discontinue Wegovy, and start Mounjaro 5 mg once weekly with no refills.  We will refill metformin for 1 month.  - metFORMIN (GLUCOPHAGE) 500 MG tablet; Take 1 tablet (500 mg total) by mouth 2 (two) times daily with a meal.  Dispense: 60 tablet; Refill: 0 - tirzepatide (MOUNJARO) 5 MG/0.5ML Pen; Inject 5 mg into the skin once a week.  Dispense: 6 mL; Refill: 0  2. Functional diarrhea Candace [name] is okay to decrease metformin to 1 tablet by mouth daily, and she will work on decreasing simple carbohydrates and fats.  3. Obesity, Current BMI 37.2 Candace [name] is currently in the action stage of change. As such, her goal is to continue with weight loss efforts. She has  agreed to keeping a food journal and adhering to recommended goals of 1400-1500 calories and 100+ grams of protein daily.   Behavioral modification strategies: no skipping meals.  Candace [name] has agreed to follow-up with our clinic in 4 weeks. She was informed of the importance of frequent follow-up visits to maximize her success with intensive lifestyle modifications for her multiple health conditions.   Objective:   Blood pressure (!) 159/85, pulse (!) 102, temperature 98.9 F (37.2 C), height '5\' 7"'$  (1.702 m), weight 237 lb (107.5 kg), SpO2 97 %. Body mass index is 37.12 kg/m.  General: Cooperative, alert, well developed, in no acute distress. HEENT: Conjunctivae and lids unremarkable. Cardiovascular: Regular rhythm.  Lungs: Normal work of breathing. Neurologic: No focal deficits.   Lab Results  Component Value Date   CREATININE 0.76 02/20/2022   BUN 12 02/20/2022   NA 138 02/20/2022   K 4.1 02/20/2022   CL 99 02/20/2022   CO2 26 02/20/2022   Lab Results  Component Value Date   ALT 14 02/20/2022   AST 12 02/20/2022   ALKPHOS 85 02/20/2022   BILITOT 0.3 02/20/2022   Lab Results  Component Value Date   HGBA1C 5.9 (H) 02/20/2022   HGBA1C 6.3 (H) 09/18/2021   HGBA1C 6.2 (H) 12/05/2020   HGBA1C 6.2 (H) 04/04/2020   HGBA1C 6.0 (H) 09/08/2019   Lab Results  Component Value Date   INSULIN 11.7 02/20/2022   INSULIN 18.8 09/18/2021   INSULIN 13.5 12/05/2020   INSULIN 22.9 04/04/2020  INSULIN 12.6 09/08/2019   Lab Results  Component Value Date   TSH 2.200 12/05/2020   Lab Results  Component Value Date   CHOL 151 02/20/2022   HDL 53 02/20/2022   LDLCALC 68 02/20/2022   TRIG 182 (H) 02/20/2022   CHOLHDL 3.9 04/04/2020   Lab Results  Component Value Date   VD25OH 62.2 02/20/2022   VD25OH 71.0 09/18/2021   VD25OH 60.1 12/05/2020   Lab Results  Component Value Date   WBC 7.1 04/15/2019   HGB 13.4 04/15/2019   HCT 41.4 04/15/2019   MCV 83.8 04/15/2019   PLT 297  04/15/2019   No results found for: "IRON", "TIBC", "FERRITIN"  Attestation Statements:   Reviewed by clinician on day of visit: allergies, medications, problem list, medical history, surgical history, family history, social history, and previous encounter notes.   I, Trixie Dredge, am acting as transcriptionist for Dennard Nip, MD.  I have reviewed the above documentation for accuracy and completeness, and I agree with the above. -  Dennard Nip, MD

## 2022-07-10 ENCOUNTER — Encounter (INDEPENDENT_AMBULATORY_CARE_PROVIDER_SITE_OTHER): Payer: Self-pay | Admitting: Physician Assistant

## 2022-07-10 ENCOUNTER — Other Ambulatory Visit (HOSPITAL_COMMUNITY): Payer: Self-pay

## 2022-07-10 ENCOUNTER — Ambulatory Visit (INDEPENDENT_AMBULATORY_CARE_PROVIDER_SITE_OTHER): Payer: 59 | Admitting: Physician Assistant

## 2022-07-10 VITALS — BP 122/70 | HR 73 | Temp 98.6°F | Ht 67.0 in | Wt 231.0 lb

## 2022-07-10 DIAGNOSIS — E669 Obesity, unspecified: Secondary | ICD-10-CM | POA: Diagnosis not present

## 2022-07-10 DIAGNOSIS — Z6836 Body mass index (BMI) 36.0-36.9, adult: Secondary | ICD-10-CM | POA: Diagnosis not present

## 2022-07-10 DIAGNOSIS — I10 Essential (primary) hypertension: Secondary | ICD-10-CM

## 2022-07-10 DIAGNOSIS — Z7985 Long-term (current) use of injectable non-insulin antidiabetic drugs: Secondary | ICD-10-CM

## 2022-07-10 DIAGNOSIS — E1169 Type 2 diabetes mellitus with other specified complication: Secondary | ICD-10-CM

## 2022-07-10 DIAGNOSIS — Z7984 Long term (current) use of oral hypoglycemic drugs: Secondary | ICD-10-CM

## 2022-07-10 MED ORDER — METFORMIN HCL 500 MG PO TABS
500.0000 mg | ORAL_TABLET | Freq: Two times a day (BID) | ORAL | 0 refills | Status: DC
  Filled 2022-07-10: qty 60, 30d supply, fill #0

## 2022-07-17 NOTE — Progress Notes (Signed)
Chief Complaint:   OBESITY Candace [name] is here to discuss her progress with her obesity treatment plan along with follow-up of her obesity related diagnoses. Candace [name] is on keeping a food journal and adhering to recommended goals of 1400-1500 calories and 100 grams of protein and states she is following her eating plan approximately 50% of the time. Candace [name] states she is exercising 0 minutes 0 times per week.  Today's visit was #: 42 Starting weight: 249 lbs Starting date: 09/17/2017 Today's weight: 231 lbs Today's date: 07/10/2022 Total lbs lost to date: 18 lbs Total lbs lost since last in-office visit: 6  Interim History: She has done well with weight loss.  She reports she went off her nutrition plan for a brief time over the holidays, but got quickly back on track with nutrition plan.  She has been on [place] in the past, but developed severe nausea and vomiting after the switch to [facility] and would like to remain off of all GLP-1 medications.    Subjective:   1. Type 2 diabetes mellitus with other specified complication, without long-term current use of insulin ([place]) Javaya developed severe nausea/vomiting with Mounjaro and has decided to continue Metformin 500 mg twice a day only.  Working on decreasing simple carbs, healthy eating, exercise to promote weight loss.  2. Essential hypertension Egan blood pressure recheck today--at goal.  On Cozaar 50 mg daily--Denies any side effects.  No symptoms of hypotension.   Assessment/Plan:   1. Type 2 diabetes mellitus with other specified complication, without long-term current use of insulin (HCC) Continue/Refill Metformin 500 mg twice a day for 1 month with 0 refills.  Continue Prescribed Nutrition Plan and exercise to promote weight loss and improve glycemic control.  -Refill metFORMIN (GLUCOPHAGE) 500 MG tablet; Take 1 tablet (500 mg total) by mouth 2 (two) times daily with a meal.  Dispense: 60 tablet; Refill: 0  2.  Essential hypertension Continue Cozaar.  Continue Prescribed Nutrition Plan and exercise to promote weight loss. Monitor for hypotension.  3. Obesity, Current BMI 36.3 Candace [name] is currently in the action stage of change. As such, her goal is to continue with weight loss efforts. She has agreed to keeping a food journal and adhering to recommended goals of 1400-1500 calories and 100 protein daily.   Exercise goals: All adults should avoid inactivity. Some physical activity is better than none, and adults who participate in any amount of physical activity gain some health benefits.  Behavioral modification strategies: increasing lean protein intake, decreasing simple carbohydrates, meal planning and cooking strategies, and keeping a strict food journal.  [name] has agreed to follow-up with our clinic in 3 weeks. She was informed of the importance of frequent follow-up visits to maximize her success with intensive lifestyle modifications for her multiple health conditions.   Objective:   Blood pressure 122/70, pulse 73, temperature 98.6 F (37 C), height '5\' 7"'$  (1.702 m), weight 231 lb (104.8 kg), SpO2 96 %. Body mass index is 36.18 kg/m.  General: Cooperative, alert, well developed, in no acute distress. HEENT: Conjunctivae and lids unremarkable. Cardiovascular: Regular rhythm.  Lungs: Normal work of breathing. Neurologic: No focal deficits.   Lab Results  Component Value Date   CREATININE 0.76 02/20/2022   BUN 12 02/20/2022   NA 138 02/20/2022   K 4.1 02/20/2022   CL 99 02/20/2022   CO2 26 02/20/2022   Lab Results  Component Value Date   ALT 14 02/20/2022   AST 12 02/20/2022  ALKPHOS 85 02/20/2022   BILITOT 0.3 02/20/2022   Lab Results  Component Value Date   HGBA1C 5.9 (H) 02/20/2022   HGBA1C 6.3 (H) 09/18/2021   HGBA1C 6.2 (H) 12/05/2020   HGBA1C 6.2 (H) 04/04/2020   HGBA1C 6.0 (H) 09/08/2019   Lab Results  Component Value Date   INSULIN 11.7 02/20/2022   INSULIN  18.8 09/18/2021   INSULIN 13.5 12/05/2020   INSULIN 22.9 04/04/2020   INSULIN 12.6 09/08/2019   Lab Results  Component Value Date   TSH 2.200 12/05/2020   Lab Results  Component Value Date   CHOL 151 02/20/2022   HDL 53 02/20/2022   LDLCALC 68 02/20/2022   TRIG 182 (H) 02/20/2022   CHOLHDL 3.9 04/04/2020   Lab Results  Component Value Date   VD25OH 62.2 02/20/2022   VD25OH 71.0 09/18/2021   VD25OH 60.1 12/05/2020   Lab Results  Component Value Date   WBC 7.1 04/15/2019   HGB 13.4 04/15/2019   HCT 41.4 04/15/2019   MCV 83.8 04/15/2019   PLT 297 04/15/2019   No results found for: "IRON", "TIBC", "FERRITIN"  Attestation Statements:   Reviewed by clinician on day of visit: allergies, medications, problem list, medical history, surgical history, family history, social history, and previous encounter notes.  I, Brendell Tyus, am acting as transcriptionist for [employer], [place].  I have reviewed the above documentation for accuracy and completeness, and I agree with the above. -  Sky Borboa,PA-C

## 2022-07-31 ENCOUNTER — Encounter (INDEPENDENT_AMBULATORY_CARE_PROVIDER_SITE_OTHER): Payer: Self-pay | Admitting: Family Medicine

## 2022-07-31 ENCOUNTER — Other Ambulatory Visit (HOSPITAL_COMMUNITY): Payer: Self-pay

## 2022-07-31 ENCOUNTER — Ambulatory Visit (INDEPENDENT_AMBULATORY_CARE_PROVIDER_SITE_OTHER): Payer: 59 | Admitting: Family Medicine

## 2022-07-31 VITALS — BP 156/84 | HR 97 | Temp 98.3°F | Ht 67.0 in | Wt 235.0 lb

## 2022-07-31 DIAGNOSIS — E1169 Type 2 diabetes mellitus with other specified complication: Secondary | ICD-10-CM | POA: Diagnosis not present

## 2022-07-31 DIAGNOSIS — Z6836 Body mass index (BMI) 36.0-36.9, adult: Secondary | ICD-10-CM

## 2022-07-31 DIAGNOSIS — Z7985 Long-term (current) use of injectable non-insulin antidiabetic drugs: Secondary | ICD-10-CM

## 2022-07-31 DIAGNOSIS — I10 Essential (primary) hypertension: Secondary | ICD-10-CM

## 2022-07-31 DIAGNOSIS — E669 Obesity, unspecified: Secondary | ICD-10-CM | POA: Insufficient documentation

## 2022-07-31 MED ORDER — METFORMIN HCL 500 MG PO TABS
500.0000 mg | ORAL_TABLET | Freq: Two times a day (BID) | ORAL | 0 refills | Status: DC
  Filled 2022-07-31: qty 60, 30d supply, fill #0

## 2022-08-02 ENCOUNTER — Other Ambulatory Visit (HOSPITAL_COMMUNITY): Payer: Self-pay

## 2022-08-02 MED ORDER — PEG 3350-KCL-NA BICARB-NACL 420 G PO SOLR
ORAL | 0 refills | Status: DC
  Filled 2022-08-02: qty 4000, 1d supply, fill #0

## 2022-08-13 DIAGNOSIS — K5730 Diverticulosis of large intestine without perforation or abscess without bleeding: Secondary | ICD-10-CM | POA: Diagnosis not present

## 2022-08-13 DIAGNOSIS — Z86010 Personal history of colonic polyps: Secondary | ICD-10-CM | POA: Diagnosis not present

## 2022-08-13 DIAGNOSIS — K648 Other hemorrhoids: Secondary | ICD-10-CM | POA: Diagnosis not present

## 2022-08-13 DIAGNOSIS — K644 Residual hemorrhoidal skin tags: Secondary | ICD-10-CM | POA: Diagnosis not present

## 2022-08-13 NOTE — Progress Notes (Signed)
Chief Complaint:   OBESITY Candace [name] is here to discuss her progress with her obesity treatment plan along with follow-up of her obesity related diagnoses. Candace [name] is on keeping a food journal and adhering to recommended goals of 1400-1500 calories and 100 grams of protein and states she is following her eating plan approximately 50% of the time. Candace [name] states she is doing 0 minutes 0 times per week.  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 She is sleeping well. She is working on following her Category 3 plan.   Subjective:   1. Type 2 diabetes mellitus with other specified complication, without long-term current use of insulin (HCC) Candace [name] has nausea with Mounjaro.   2. Essential hypertension Candace [name]'s blood pressure is elevated today, it is often elevated.   Assessment/Plan:   1. Type 2 diabetes mellitus with other specified complication, without long-term current use of insulin (HCC) We will refill metformin for 1 month. Candace [name] is working on getting back to her eating plan.   - metFORMIN (GLUCOPHAGE) 500 MG tablet; Take 1 tablet (500 mg total) by mouth 2 (two) times daily with a meal.  Dispense: 60 tablet; Refill: 0  2. Essential hypertension We will recheck her blood pressure at her next visit.   3. BMI 36.0-36.9,adult  4. Obesity, Beginnin BMI 65 Candace [name] is currently in the action stage of change. As such, her goal is to continue with weight loss efforts. She has agreed to the Category 3 Plan.   We discussed meal planning strategies in depth today. She will put these strategies in place by her next visit.   Behavioral modification strategies: meal planning and cooking strategies.  Candace [name] has agreed to follow-up  with our clinic in 4 weeks. She was informed of the importance of frequent follow-up visits to maximize her success with intensive lifestyle modifications for her multiple health conditions.   Objective:   Blood pressure (!) 156/84, pulse 97, temperature 98.3 F (36.8 C), height 5' 7"$  (1.702 m), weight 235 lb (106.6 kg), SpO2 95 %. Body mass index is 36.81 kg/m.  General: Cooperative, alert, well developed, in no acute distress. HEENT: Conjunctivae and lids unremarkable. Cardiovascular: Regular rhythm.  Lungs: Normal work of breathing. Neurologic: No focal deficits.   Lab Results  Component Value Date   CREATININE 0.76 02/20/2022   BUN 12 02/20/2022   NA 138 02/20/2022   K 4.1 02/20/2022   CL 99 02/20/2022   CO2 26 02/20/2022   Lab Results  Component Value Date   ALT 14 02/20/2022   AST 12 02/20/2022   ALKPHOS 85 02/20/2022   BILITOT 0.3 02/20/2022   Lab Results  Component Value Date   HGBA1C 5.9 (H) 02/20/2022   HGBA1C 6.3 (H) 09/18/2021   HGBA1C 6.2 (H) 12/05/2020   HGBA1C 6.2 (H) 04/04/2020   HGBA1C 6.0 (H) 09/08/2019   Lab Results  Component Value Date   INSULIN 11.7 02/20/2022   INSULIN 18.8 09/18/2021   INSULIN 13.5 12/05/2020   INSULIN 22.9 04/04/2020   INSULIN 12.6 09/08/2019   Lab Results  Component Value Date   TSH 2.200 12/05/2020   Lab Results  Component Value Date   CHOL 151 02/20/2022   HDL 53 02/20/2022  LDLCALC 68 02/20/2022   TRIG 182 (H) 02/20/2022   CHOLHDL 3.9 04/04/2020   Lab Results  Component Value Date   VD25OH 62.2 02/20/2022   VD25OH 71.0 09/18/2021   VD25OH 60.1 12/05/2020   Lab Results  Component Value Date   WBC 7.1 04/15/2019   HGB 13.4 04/15/2019   HCT 41.4 04/15/2019   MCV 83.8 04/15/2019   PLT 297 04/15/2019   No results found for: "IRON", "TIBC", "FERRITIN"  Attestation Statements:   Reviewed by clinician on day of visit: allergies, medications, problem list, medical history, surgical history, family  history, social history, and previous encounter notes.  I have personally spent 40 minutes total time today in preparation, patient care, and documentation for this visit, including the following: review of clinical lab tests; review of medical tests/procedures/services.  I, Trixie Dredge, am acting as transcriptionist for Dennard Nip, MD.  I have reviewed the above documentation for accuracy and completeness, and I agree with the above. -  Dennard Nip, MD

## 2022-08-22 ENCOUNTER — Ambulatory Visit: Payer: 59 | Admitting: Internal Medicine

## 2022-08-29 ENCOUNTER — Other Ambulatory Visit (HOSPITAL_COMMUNITY): Payer: Self-pay

## 2022-08-29 ENCOUNTER — Encounter (INDEPENDENT_AMBULATORY_CARE_PROVIDER_SITE_OTHER): Payer: Self-pay | Admitting: Family Medicine

## 2022-08-29 ENCOUNTER — Ambulatory Visit (INDEPENDENT_AMBULATORY_CARE_PROVIDER_SITE_OTHER): Payer: 59 | Admitting: Family Medicine

## 2022-08-29 VITALS — BP 163/75 | HR 91 | Temp 98.2°F | Ht 67.0 in | Wt 234.0 lb

## 2022-08-29 DIAGNOSIS — Z7984 Long term (current) use of oral hypoglycemic drugs: Secondary | ICD-10-CM

## 2022-08-29 DIAGNOSIS — E1169 Type 2 diabetes mellitus with other specified complication: Secondary | ICD-10-CM

## 2022-08-29 DIAGNOSIS — Z6836 Body mass index (BMI) 36.0-36.9, adult: Secondary | ICD-10-CM

## 2022-08-29 DIAGNOSIS — E669 Obesity, unspecified: Secondary | ICD-10-CM | POA: Diagnosis not present

## 2022-08-29 DIAGNOSIS — I10 Essential (primary) hypertension: Secondary | ICD-10-CM | POA: Insufficient documentation

## 2022-08-29 MED ORDER — METFORMIN HCL 500 MG PO TABS
500.0000 mg | ORAL_TABLET | Freq: Two times a day (BID) | ORAL | 0 refills | Status: DC
  Filled 2022-08-29: qty 60, 30d supply, fill #0

## 2022-09-03 ENCOUNTER — Other Ambulatory Visit (HOSPITAL_COMMUNITY): Payer: Self-pay

## 2022-09-03 MED ORDER — OMRON 3 SERIES BP MONITOR DEVI
0 refills | Status: AC
  Filled 2022-09-03: qty 1, 30d supply, fill #0

## 2022-09-05 ENCOUNTER — Encounter: Payer: Self-pay | Admitting: Radiology

## 2022-09-16 NOTE — Progress Notes (Signed)
Chief Complaint:   OBESITY Candace [name] is here to discuss her progress with her obesity treatment plan along with follow-up of her obesity related diagnoses. Candace [name] is on the Category 3 Plan and states she is following her eating plan approximately 10% of the time. Candace [name] states she is walking extra steps and stairs.    Today's visit was #: 22 Starting weight: 249 lbs Starting date: 09/17/2017 Today's weight: 234 lbs Today's date: 08/29/2022 Total lbs lost to date: 15 Total lbs lost since last in-office visit: 1  Interim History: Candace [name] continues to work on her diet and increasing exercise.  She is caring for her 2 young grandchildren and she is more active.  Subjective:   1. White coat syndrome with diagnosis of hypertension Candace [name]'s blood pressure is elevated today.  She is on losartan and triamterene-hydrochlorothiazide.  She feels this is whitecoat hypertension.  2. Type 2 diabetes mellitus with other specified complication, without long-term current use of insulin (HCC) Candace [name]'s last A1c in August 2023 was controlled at 5.9 with her diet, exercise, and metformin. She denies nausea, vomiting, or hypoglycemia.   Assessment/Plan:   1. White coat syndrome with diagnosis of hypertension Candace [name] is to check her blood pressure at home and bring in her log to her next appointment. She will continue with her diet and exercise to treat hypertension.   2. Type 2 diabetes mellitus with other specified complication, without long-term current use of insulin ([place]) Candace [name] will continue metformin, and we will refill metformin for 1 month. She will continue with her diet and exercise.   - metFORMIN (GLUCOPHAGE) 500 MG tablet; Take 1 tablet (500 mg total) by mouth 2 (two) times daily with a meal.  Dispense: 60 tablet; Refill: 0  3. BMI 36.0-36.9,adult  4. Obesity, Beginning BMI 39.0 Candace [name] is currently in the action stage of change. As such, her goal is to continue with weight loss efforts. She  has agreed to the Category 3 Plan.   Exercise goals: As is.   Behavioral modification strategies: increasing vegetables, increasing water intake, and increasing high fiber foods.  Candace [name] has agreed to follow-up with our clinic in 4 weeks. She was informed of the importance of frequent follow-up visits to maximize her success with intensive lifestyle modifications for her multiple health conditions.   Objective:   Blood pressure (!) 163/75, pulse 91, temperature 98.2 F (36.8 C), height 5\' 7"  (1.702 m), weight 234 lb (106.1 kg), SpO2 97 %. Body mass index is 36.65 kg/m.  Lab Results  Component Value Date   CREATININE 0.76 02/20/2022   BUN 12 02/20/2022   NA 138 02/20/2022   K 4.1 02/20/2022   CL 99 02/20/2022   CO2 26 02/20/2022   Lab Results  Component Value Date   ALT 14 02/20/2022   AST 12 02/20/2022   ALKPHOS 85 02/20/2022   BILITOT 0.3 02/20/2022   Lab Results  Component Value Date   HGBA1C 5.9 (H) 02/20/2022   HGBA1C 6.3 (H) 09/18/2021   HGBA1C 6.2 (H) 12/05/2020   HGBA1C 6.2 (H) 04/04/2020   HGBA1C 6.0 (H) 09/08/2019   Lab Results  Component Value Date   INSULIN 11.7 02/20/2022   INSULIN 18.8 09/18/2021   INSULIN 13.5 12/05/2020   INSULIN 22.9 04/04/2020   INSULIN 12.6 09/08/2019   Lab Results  Component Value Date   TSH 2.200 12/05/2020   Lab Results  Component Value Date   CHOL 151 02/20/2022   HDL 53 02/20/2022   [place]  68 02/20/2022   TRIG 182 (H) 02/20/2022   CHOLHDL 3.9 04/04/2020   Lab Results  Component Value Date   VD25OH 62.2 02/20/2022   VD25OH 71.0 09/18/2021   VD25OH 60.1 12/05/2020   Lab Results  Component Value Date   WBC 7.1 04/15/2019   HGB 13.4 04/15/2019   HCT 41.4 04/15/2019   MCV 83.8 04/15/2019   PLT 297 04/15/2019   No results found for: "IRON", "TIBC", "FERRITIN"  Attestation Statements:   Reviewed by clinician on day of visit: allergies, medications, problem list, medical history, surgical history, family  history, social history, and previous encounter notes.  I have personally spent 50 minutes total time today in preparation, patient care, and documentation for this visit, including the following: review of clinical lab tests; review of medical tests/procedures/services.   I, Trixie Dredge, am acting as transcriptionist for Dennard Nip, MD.  I have reviewed the above documentation for accuracy and completeness, and I agree with the above. -  Dennard Nip, MD

## 2022-09-17 ENCOUNTER — Other Ambulatory Visit (HOSPITAL_COMMUNITY): Payer: Self-pay

## 2022-09-17 DIAGNOSIS — F419 Anxiety disorder, unspecified: Secondary | ICD-10-CM | POA: Diagnosis not present

## 2022-09-17 DIAGNOSIS — F3341 Major depressive disorder, recurrent, in partial remission: Secondary | ICD-10-CM | POA: Diagnosis not present

## 2022-09-17 DIAGNOSIS — Z23 Encounter for immunization: Secondary | ICD-10-CM | POA: Diagnosis not present

## 2022-09-17 DIAGNOSIS — K219 Gastro-esophageal reflux disease without esophagitis: Secondary | ICD-10-CM | POA: Diagnosis not present

## 2022-09-17 DIAGNOSIS — E785 Hyperlipidemia, unspecified: Secondary | ICD-10-CM | POA: Diagnosis not present

## 2022-09-17 DIAGNOSIS — I10 Essential (primary) hypertension: Secondary | ICD-10-CM | POA: Diagnosis not present

## 2022-09-17 DIAGNOSIS — G2581 Restless legs syndrome: Secondary | ICD-10-CM | POA: Diagnosis not present

## 2022-09-17 MED ORDER — ROSUVASTATIN CALCIUM 10 MG PO TABS
10.0000 mg | ORAL_TABLET | Freq: Every evening | ORAL | 3 refills | Status: DC
  Filled 2022-09-17: qty 90, 90d supply, fill #0
  Filled 2022-12-18: qty 90, 90d supply, fill #1
  Filled 2023-03-17: qty 90, 90d supply, fill #2
  Filled 2023-06-13: qty 90, 90d supply, fill #3

## 2022-09-17 MED ORDER — BUPROPION HCL ER (XL) 300 MG PO TB24
300.0000 mg | ORAL_TABLET | Freq: Every morning | ORAL | 1 refills | Status: DC
  Filled 2022-09-17 – 2022-11-20 (×2): qty 90, 90d supply, fill #0
  Filled 2023-03-17: qty 90, 90d supply, fill #1

## 2022-09-17 MED ORDER — TRIAMTERENE-HCTZ 37.5-25 MG PO TABS
1.0000 | ORAL_TABLET | Freq: Every day | ORAL | 1 refills | Status: DC
  Filled 2022-09-17 – 2022-12-18 (×2): qty 90, 90d supply, fill #0

## 2022-09-17 MED ORDER — LOSARTAN POTASSIUM 50 MG PO TABS
50.0000 mg | ORAL_TABLET | Freq: Every day | ORAL | 1 refills | Status: DC
  Filled 2022-09-17: qty 90, 90d supply, fill #0

## 2022-09-19 ENCOUNTER — Other Ambulatory Visit: Payer: Self-pay

## 2022-09-20 ENCOUNTER — Inpatient Hospital Stay: Payer: 59 | Attending: Adult Health | Admitting: Adult Health

## 2022-09-20 ENCOUNTER — Other Ambulatory Visit: Payer: Self-pay

## 2022-09-20 ENCOUNTER — Other Ambulatory Visit (HOSPITAL_COMMUNITY): Payer: Self-pay

## 2022-09-20 VITALS — BP 130/56 | HR 92 | Temp 97.8°F | Resp 18 | Ht 67.0 in | Wt 240.1 lb

## 2022-09-20 DIAGNOSIS — Z170 Estrogen receptor positive status [ER+]: Secondary | ICD-10-CM | POA: Diagnosis not present

## 2022-09-20 DIAGNOSIS — Z79811 Long term (current) use of aromatase inhibitors: Secondary | ICD-10-CM | POA: Insufficient documentation

## 2022-09-20 DIAGNOSIS — Z923 Personal history of irradiation: Secondary | ICD-10-CM | POA: Insufficient documentation

## 2022-09-20 DIAGNOSIS — C50411 Malignant neoplasm of upper-outer quadrant of right female breast: Secondary | ICD-10-CM

## 2022-09-20 NOTE — Progress Notes (Signed)
[facility] Follow up:    [name], MD [address] [place]  [address]   DIAGNOSIS:  Cancer Staging  Malignant neoplasm of upper-outer quadrant of right breast in female, estrogen receptor positive ([place]) Staging form: Breast, AJCC 7th Edition - Pathologic: Stage IA (T1b, N0, cM0) - Unsigned   SUMMARY OF ONCOLOGIC HISTORY: Oncology History  Malignant neoplasm of upper-outer quadrant of right breast in female, estrogen receptor positive ([place])  04/05/2016 Initial Biopsy   Right breast needle core biopsy (10 o'clock): ILC, ER+(90%), PR+(5%), Ki-67 20%, HER-2 negative (ratio 0.82).    04/23/2016 Genetic Testing   Genetic testing was normal and did not reveal a deleterious mutation, or variant of uncertain significance.  Genes tested include: ATM, BARD1, BRCA1, BRCA2, BRIP1, CDH1, CHEK2, FANCC, MLH1, MSH2, MSH6, NBN, PALB2, PMS2, PTEN, RAD51C, RAD51D, TP53, and XRCC2.  This panel also includes deletion/duplication analysis (without sequencing) for one gene, EPCAM.     04/23/2016 Oncotype testing   Score: 22, 14%   05/02/2016 Initial Diagnosis   Malignant neoplasm of upper-outer quadrant of right breast in female, estrogen receptor positive ([place])   05/28/2016 Surgery   Right breast lumpectomy [facility]): ILC, grade 2, 1cm, 4 SLN negative, T1b, N0   07/22/2016 - 09/04/2016 Radiation Therapy   Adjuvant radiation [facility]): 1) Right breast/ 50.4 Gy in 28 fractions.  2) Right breast boost/ 10 Gy in 5 fractions   09/2016 -  Anti-estrogen oral therapy   Anastrozole 1mg  daily     CURRENT THERAPY: Anastrozole daily  INTERVAL HISTORY: PAULETT [name] 62 y.o. female returns for follow-up of her history of invasive lobular carcinoma in the right breast.  She continues on anastrozole with good tolerance.  Most recent mammogram occurred on Nov 14, 2021 demonstrating no evidence of malignancy and breast density category B.  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  She continues to see her primary care provider regularly.   Patient Active Problem List   Diagnosis Date Noted   White coat syndrome with diagnosis of hypertension 08/29/2022   BMI 36.0-36.9,adult 07/31/2022   Obesity, Beginnin BMI 39 07/31/2022   Functional diarrhea 06/19/2022   Absolute anemia 03/26/2022   Vitamin D deficiency 02/20/2022   Diabetes mellitus ([place]) 11/22/2021   Essential hypertension 11/22/2021   At risk for heart disease 10/30/2021   Trigger finger, right middle finger 09/20/2021   Non-obstructive CAD    GERD (gastroesophageal reflux disease)    Type 2 diabetes mellitus ([place])    Hyperlipidemia    Pre-diabetes 02/06/2021   Hypertension 02/06/2021   Class 2 severe obesity with serious comorbidity and body mass index (BMI) of 39.0 to 39.9 in adult [facility]) 02/06/2021   Genetic testing 05/13/2016   Malignant neoplasm of upper-outer quadrant of right breast in female, estrogen receptor positive ([place]) 05/02/2016   Family history of breast cancer in sister 04/23/2016   Depression    Endometrial hyperplasia    Oligomenorrhea    Insomnia    H/O hemorrhoids    Epidermal cyst    High risk HPV infection     has No Known Allergies.  MEDICAL HISTORY: Past Medical History:  Diagnosis Date   Abnormal Pap smear    Alcohol abuse    Anovulation    Anxiety    Breast cancer ([place]) 04/05/2016   right breast   Depression    Endometrial hyperplasia    Endometrial  polyp    h/o   Epidermal cyst    h/o   Gallbladder problem    GERD (gastroesophageal reflux disease)    H/O hemorrhoids    H/O menorrhagia    High risk HPV infection    Hyperlipidemia    Hypertension    Insomnia    Non-obstructive CAD    minimal non-obstructive CAD noted on coronary CTA in 05/2021   Obesity    Oligomenorrhea    Prediabetes    Restless legs syndrome    Seasonal allergies    Sleep apnea    Type 2 diabetes mellitus ([place])     Vitamin D deficiency     SURGICAL HISTORY: Past Surgical History:  Procedure Laterality Date   BREAST LUMPECTOMY Right 05/2016   radiation   BREAST LUMPECTOMY WITH RADIOACTIVE SEED AND SENTINEL LYMPH NODE BIOPSY Right 05/28/2016   Procedure: RADIOACTIVE SEED GUIDED RIGHT BREAST LUMPECTOMY WITH  RIGHT AXILLARY SENTINEL LYMPH NODE BIOPSY;  Surgeon: [name], MD;  Location: [facility];  Service: General;  Laterality: Right;   CARPAL TUNNEL RELEASE  03/26/2011   right hand   CARPAL TUNNEL RELEASE  05/28/2011   Procedure: CARPAL TUNNEL RELEASE;  Surgeon: [name];  Location: [facility];  Service: Orthopedics;  Laterality: Left;   CHOLECYSTECTOMY N/A 01/24/2013   Procedure: LAPAROSCOPIC CHOLECYSTECTOMY WITH INTRAOPERATIVE CHOLANGIOGRAM;  Surgeon: [name], MD;  Location: [facility];  Service: General;  Laterality: N/A;   COLONOSCOPY     HYSTEROSCOPY     [place]   as child   TRIGGER FINGER RELEASE Right 09/20/2021   Procedure: RIGHT MIDDLE FINGER A-1 PULLEY RELEASE;  Surgeon: [name], MD;  Location: [facility];  Service: Orthopedics;  Laterality: Right;   trigger fingers  8 yrs. ago   both done months apart   uterine ablation  06/04/2010   uterine ablation      SOCIAL HISTORY: Social History   Socioeconomic History   Marital status: Divorced    Spouse name: Not on file   Number of children: 2   Years of education: Not on file   Highest education level: Not on file  Occupational History   Not on file  Tobacco Use   Smoking status: Never   Smokeless tobacco: Never  Vaping Use   Vaping Use: Never used  Substance and Sexual Activity   Alcohol use: No    Comment: none since 1996   Drug use: No   Sexual activity: Not Currently    Birth control/protection: None  Other Topics Concern   Not on file  Social History Narrative   Not on file   Social Determinants of Health   Financial Resource Strain: Not  on file  Food Insecurity: Not on file  Transportation Needs: Not on file  Physical Activity: Not on file  Stress: Not on file  Social Connections: Not on file  Intimate Partner Violence: Not on file    FAMILY HISTORY: Family History  Problem Relation Age of Onset   Heart disease Mother    Hypertension Mother    Stroke Mother    Dementia Mother        vascular dementia   Hyperlipidemia Mother    Depression Mother    Anxiety disorder Mother    Alcohol abuse Mother    Obesity Mother    Pulmonary fibrosis Father        d. 49   Depression Father  Anxiety disorder Father    Alcohol abuse Father    Heart attack Maternal Grandmother        d. 63-65y   Heart disease Maternal Grandmother    Heart disease Maternal Grandfather        d. 60-63   Leukemia Paternal Grandmother        d. late 98s   Breast cancer Sister 70       IDC s/p mastectomy and chemo    Review of Systems  Constitutional:  Negative for appetite change, chills, fatigue, fever and unexpected weight change.  HENT:   Negative for hearing loss, lump/mass and trouble swallowing.   Eyes:  Negative for eye problems and icterus.  Respiratory:  Negative for chest tightness, cough and shortness of breath.   Cardiovascular:  Negative for chest pain, leg swelling and palpitations.  Gastrointestinal:  Negative for abdominal distention, abdominal pain, constipation, diarrhea, nausea and vomiting.  Endocrine: Negative for hot flashes.  Genitourinary:  Negative for difficulty urinating.   Musculoskeletal:  Negative for arthralgias.  Skin:  Negative for itching and rash.  Neurological:  Negative for dizziness, extremity weakness, headaches and numbness.  Hematological:  Negative for adenopathy. Does not bruise/bleed easily.  Psychiatric/Behavioral:  Negative for depression. The patient is not nervous/anxious.      PHYSICAL EXAMINATION  ECOG PERFORMANCE STATUS: 1 - Symptomatic but completely ambulatory  Vitals:    09/20/22 0921  BP: (!) 130/56  Pulse: 92  Resp: 18  Temp: 97.8 F (36.6 C)  SpO2: 98%    Physical Exam Constitutional:      General: She is not in acute distress.    Appearance: Normal appearance. She is not toxic-appearing.  HENT:     Head: Normocephalic and atraumatic.  Eyes:     General: No scleral icterus. Cardiovascular:     Rate and Rhythm: Normal rate and regular rhythm.     Pulses: Normal pulses.     Heart sounds: Normal heart sounds.  Pulmonary:     Effort: Pulmonary effort is normal.     Breath sounds: Normal breath sounds.  Chest:     Comments: Right breast status postlumpectomy and radiation no sign of local recurrence left breast is benign. Abdominal:     General: Abdomen is flat. Bowel sounds are normal. There is no distension.     Palpations: Abdomen is soft.     Tenderness: There is no abdominal tenderness.  Musculoskeletal:        General: No swelling.     Cervical back: Neck supple.  Lymphadenopathy:     Cervical: No cervical adenopathy.  Skin:    General: Skin is warm and dry.     Findings: No rash.  Neurological:     General: No focal deficit present.     Mental Status: She is alert.  Psychiatric:        Mood and Affect: Mood normal.        Behavior: Behavior normal.     LABORATORY DATA:  CBC    Component Value Date/Time   WBC 7.1 04/15/2019 0941   WBC 6.8 11/11/2017 1127   RBC 4.94 04/15/2019 0941   HGB 13.4 04/15/2019 0941   HGB 13.7 04/16/2017 1409   HCT 41.4 04/15/2019 0941   HCT 41.2 04/16/2017 1409   PLT 297 04/15/2019 0941   PLT 259 04/16/2017 1409   MCV 83.8 04/15/2019 0941   MCV 84.4 04/16/2017 1409   MCH 27.1 04/15/2019 0941   MCHC 32.4 04/15/2019 0941  RDW 12.6 04/15/2019 0941   RDW 13.2 04/16/2017 1409   LYMPHSABS 2.1 04/15/2019 0941   LYMPHSABS 1.9 04/16/2017 1409   MONOABS 0.8 04/15/2019 0941   MONOABS 0.6 04/16/2017 1409   EOSABS 0.2 04/15/2019 0941   EOSABS 0.1 04/16/2017 1409   BASOSABS 0.0 04/15/2019 0941    BASOSABS 0.0 04/16/2017 1409    CMP     Component Value Date/Time   NA 138 02/20/2022 1009   NA 141 04/16/2017 1409   K 4.1 02/20/2022 1009   K 3.8 04/16/2017 1409   CL 99 02/20/2022 1009   CO2 26 02/20/2022 1009   CO2 25 04/16/2017 1409   GLUCOSE 94 02/20/2022 1009   GLUCOSE 109 (H) 09/19/2021 1319   GLUCOSE 134 04/16/2017 1409   BUN 12 02/20/2022 1009   BUN 12.4 04/16/2017 1409   CREATININE 0.76 02/20/2022 1009   CREATININE 0.87 04/15/2019 0941   CREATININE 0.9 04/16/2017 1409   CALCIUM 9.9 02/20/2022 1009   CALCIUM 9.6 04/16/2017 1409   PROT 6.6 02/20/2022 1009   PROT 7.0 04/16/2017 1409   ALBUMIN 4.6 02/20/2022 1009   ALBUMIN 4.0 04/16/2017 1409   AST 12 02/20/2022 1009   AST 17 04/15/2019 0941   AST 29 04/16/2017 1409   ALT 14 02/20/2022 1009   ALT 19 04/15/2019 0941   ALT 34 04/16/2017 1409   ALKPHOS 85 02/20/2022 1009   ALKPHOS 74 04/16/2017 1409   BILITOT 0.3 02/20/2022 1009   BILITOT 0.3 04/15/2019 0941   BILITOT 0.40 04/16/2017 1409   GFRNONAA >60 09/19/2021 1319   GFRNONAA >60 04/15/2019 0941   GFRAA 102 04/04/2020 0810   GFRAA >60 04/15/2019 0941      ASSESSMENT and THERAPY PLAN:   Malignant neoplasm of upper-outer quadrant of right breast in female, estrogen receptor positive ([place]) Candace [name] is a 62 year old woman with stage Ia ER/PR positive right-sided breast cancer diagnosed in 2018 status postlumpectomy adjuvant radiation and antiestrogen therapy with anastrozole that began in April 2018.  She continues on anastrozole daily and will take this until April 2025.  She will proceed with her annual mammogram due in May 2024.  We will see [name] back in 1 year for continued long-term follow-up.  She knows to call if anything happens between now and then because we can always see her sooner.   All questions were answered. The patient knows to call the clinic with any problems, questions or concerns. We can certainly see the patient much sooner if  necessary.  Total encounter time:30 minutes*in face-to-face visit time, chart review, lab review, care coordination, order entry, and documentation of the encounter time.  Wilber Bihari, NP 09/20/22 3:20 PM Medical Oncology and Hematology [facility] [place], [place] [address] Tel. [phone]    Fax. [phone]  *Total Encounter Time as defined by the Centers for Medicare and Medicaid Services includes, in addition to the face-to-face time of a patient visit (documented in the note above) non-face-to-face time: obtaining and reviewing outside history, ordering and reviewing medications, tests or procedures, care coordination (communications with other health care professionals or caregivers) and documentation in the medical record.

## 2022-09-20 NOTE — Assessment & Plan Note (Addendum)
Candace [name] is a 62 year old woman with stage Ia ER/PR positive right-sided breast cancer diagnosed in 2018 status postlumpectomy adjuvant radiation and antiestrogen therapy with anastrozole that began in April 2018.  She continues on anastrozole daily and will take this until April 2025.  She will proceed with her annual mammogram due in May 2024.  We will see [name] back in 1 year for continued long-term follow-up.  She knows to call if anything happens between now and then because we can always see her sooner.

## 2022-09-23 ENCOUNTER — Telehealth: Payer: Self-pay | Admitting: Adult Health

## 2022-09-23 NOTE — Telephone Encounter (Signed)
Scheduled appointment per 3/22 los. Left voicemail.

## 2022-10-01 ENCOUNTER — Encounter (INDEPENDENT_AMBULATORY_CARE_PROVIDER_SITE_OTHER): Payer: Self-pay | Admitting: Family Medicine

## 2022-10-01 ENCOUNTER — Ambulatory Visit (INDEPENDENT_AMBULATORY_CARE_PROVIDER_SITE_OTHER): Payer: 59 | Admitting: Family Medicine

## 2022-10-01 VITALS — BP 133/83 | HR 86 | Temp 97.6°F | Ht 67.0 in | Wt 235.0 lb

## 2022-10-01 DIAGNOSIS — Z7984 Long term (current) use of oral hypoglycemic drugs: Secondary | ICD-10-CM

## 2022-10-01 DIAGNOSIS — I10 Essential (primary) hypertension: Secondary | ICD-10-CM | POA: Diagnosis not present

## 2022-10-01 DIAGNOSIS — Z6836 Body mass index (BMI) 36.0-36.9, adult: Secondary | ICD-10-CM | POA: Diagnosis not present

## 2022-10-01 DIAGNOSIS — E1169 Type 2 diabetes mellitus with other specified complication: Secondary | ICD-10-CM

## 2022-10-01 DIAGNOSIS — E669 Obesity, unspecified: Secondary | ICD-10-CM | POA: Diagnosis not present

## 2022-10-01 NOTE — Progress Notes (Unsigned)
Chief Complaint:   OBESITY Candace [name] is here to discuss her progress with her obesity treatment plan along with follow-up of her obesity related diagnoses. [name] is on the Category 3 Plan and states she is following her eating plan approximately 0% of the time. [name] states she is doing 0 minutes 0 times per week.  Today's visit was #: 52 Starting weight: 249 lbs Starting date: 09/17/2017 Today's weight: 235 lbs Today's date: 10/01/2022 Total lbs lost to date: 14 Total lbs lost since last in-office visit: 0  Interim History: Candace [name] has struggled more with following her plan. She is thinking about going back to a structured eating plan to help her get back on track.   Subjective:   1. Essential hypertension Candace [name]'s blood pressure at home runs mostly in 120-130/90's. She is on Maxzide-25 and losartan 50 mg.   2. Type 2 diabetes mellitus with other specified complication, without long-term current use of insulin Candace [name]'s recent A1c was 5.9 on metformin, and she is working on her diet but she has been struggling more recently. She is off her GLP-1 due to lack of insurance coverage.   Assessment/Plan:   1. Essential hypertension Candace [name] will continue with Maxzide and losartan, and she will work on getting back to weight loss to help improve her blood pressure.   2. Type 2 diabetes mellitus with other specified complication, without long-term current use of insulin Candace [name] will continue metformin BID, no refill needed.   3. BMI 36.0-36.9,adult  4. Obesity, Beginning BMI 39.0 Candace [name] is currently in the action stage of change. As such, her goal is to continue with weight loss efforts. She has agreed to the Category 3 Plan.   Behavioral modification strategies: no skipping meals and meal planning and cooking strategies.  Candace [name] has agreed to follow-up with our clinic in 4 weeks. She was informed of the importance of frequent follow-up visits to maximize her success with intensive  lifestyle modifications for her multiple health conditions.   Objective:   Blood pressure 133/83, pulse 86, temperature 97.6 F (36.4 C), height 5\' 7"  (1.702 m), weight 235 lb (106.6 kg), SpO2 96 %. Body mass index is 36.81 kg/m.  Lab Results  Component Value Date   CREATININE 0.76 02/20/2022   BUN 12 02/20/2022   NA 138 02/20/2022   K 4.1 02/20/2022   CL 99 02/20/2022   CO2 26 02/20/2022   Lab Results  Component Value Date   ALT 14 02/20/2022   AST 12 02/20/2022   ALKPHOS 85 02/20/2022   BILITOT 0.3 02/20/2022   Lab Results  Component Value Date   HGBA1C 5.9 (H) 02/20/2022   HGBA1C 6.3 (H) 09/18/2021   HGBA1C 6.2 (H) 12/05/2020   HGBA1C 6.2 (H) 04/04/2020   HGBA1C 6.0 (H) 09/08/2019   Lab Results  Component Value Date   INSULIN 11.7 02/20/2022   INSULIN 18.8 09/18/2021   INSULIN 13.5 12/05/2020   INSULIN 22.9 04/04/2020   INSULIN 12.6 09/08/2019   Lab Results  Component Value Date   TSH 2.200 12/05/2020   Lab Results  Component Value Date   CHOL 151 02/20/2022   HDL 53 02/20/2022   LDLCALC 68 02/20/2022   TRIG 182 (H) 02/20/2022   CHOLHDL 3.9 04/04/2020   Lab Results  Component Value Date   VD25OH 62.2 02/20/2022   VD25OH 71.0 09/18/2021   VD25OH 60.1 12/05/2020   Lab Results  Component Value Date   WBC 7.1 04/15/2019   HGB 13.4 04/15/2019  HCT 41.4 04/15/2019   MCV 83.8 04/15/2019   PLT 297 04/15/2019   No results found for: "IRON", "TIBC", "FERRITIN"  Attestation Statements:   Reviewed by clinician on day of visit: allergies, medications, problem list, medical history, surgical history, family history, social history, and previous encounter notes.   I, Trixie Dredge, am acting as transcriptionist for Dennard Nip, MD.  I have reviewed the above documentation for accuracy and completeness, and I agree with the above. -  Dennard Nip, MD

## 2022-10-22 ENCOUNTER — Encounter: Payer: Self-pay | Admitting: Internal Medicine

## 2022-10-22 ENCOUNTER — Ambulatory Visit: Payer: 59 | Attending: Internal Medicine | Admitting: Internal Medicine

## 2022-10-22 VITALS — BP 128/78 | HR 90 | Ht 66.5 in | Wt 238.8 lb

## 2022-10-22 DIAGNOSIS — I10 Essential (primary) hypertension: Secondary | ICD-10-CM

## 2022-10-22 DIAGNOSIS — I2510 Atherosclerotic heart disease of native coronary artery without angina pectoris: Secondary | ICD-10-CM | POA: Diagnosis not present

## 2022-10-22 DIAGNOSIS — E118 Type 2 diabetes mellitus with unspecified complications: Secondary | ICD-10-CM

## 2022-10-22 DIAGNOSIS — I4510 Unspecified right bundle-branch block: Secondary | ICD-10-CM

## 2022-10-22 DIAGNOSIS — G4733 Obstructive sleep apnea (adult) (pediatric): Secondary | ICD-10-CM

## 2022-10-22 DIAGNOSIS — Z87898 Personal history of other specified conditions: Secondary | ICD-10-CM

## 2022-10-22 DIAGNOSIS — E785 Hyperlipidemia, unspecified: Secondary | ICD-10-CM

## 2022-10-22 NOTE — Patient Instructions (Signed)
Medication Instructions:  No Changes In Medications at this time.  *If you need a refill on your cardiac medications before your next appointment, please call your pharmacy*  Follow-Up: At [facility], you and your health needs are our priority.  As part of our continuing mission to provide you with exceptional heart care, we have created designated Provider Care Teams.  These Care Teams include your primary Cardiologist (physician) and Advanced Practice Providers (APPs -  Physician Assistants and Nurse Practitioners) who all work together to provide you with the care you need, when you need it.  Your next appointment:   1 year(s)  Provider:   Gayatri A Acharya, MD    

## 2022-10-22 NOTE — Addendum Note (Signed)
Addended by: Chana Bode on: 10/22/2022 10:04 AM   Modules accepted: Orders

## 2022-10-22 NOTE — Progress Notes (Signed)
Cardiology Office Note:    Date:  10/22/2022  ID:  Candace [name], DOB 1961-05-07, MRN [id number]  PCP:  Candace Montana, MD  Cardiologist:  Candace Poisson, MD  Electrophysiologist:  None   Referring MD: Candace Montana, MD   Chief Complaint/Reason for Referral: Follow-up for chest pain  History of Present Illness:    Candace [name] is a 62 y.o. female with a history of pre-diabetes, obesity, right breast cancer, depression, GERD, HLD, carpal tunnel, who presents for follow-up. She was initially seen 05/01/2021 for the follow up of chest pain and cardiac risk.  10/22/22: 1 year of anastrazole left.  Sleep improved with wedge and reduced sx of restless leg, increased iron supp.  Improving sx of DOE with nannying. Was on wegovy, but was unavailable. Did not tolerate Monjouro. Doing diet and exercise, positive about changes.  The patient denies chest pain, chest pressure, dyspnea at rest, palpitations, PND, orthopnea, or leg swelling. Denies cough, fever, chills. Denies nausea, vomiting. Denies syncope or presyncope. Denies dizziness or lightheadedness.  Prior visits:  Since her last visit, she had a Coronary CT 05/2021 which showed minimal non-obstructive CAD with a coronary calcium score of 2 (65th percentile). There was trivial calcium seen in the proximal LAD but no significant stenosis. Her Echo 05/2021 revealed LVEF 60-65% and grade 1 diastolic dysfunction. There was aortic valve sclerosis, but no stenosis. Small PFO with predominantly left to right shunting across the atrial septum was noted.   She followed up with Candace Skiff, PA-C on 06/12/2021 where she was doing well. She reported only a few brief episodes of chest pain she described as "twinges". Her blood pressure was elevated at 160/80; at home readings were typically 140's-150's. She was started on Losartan 25 mg daily.  Today, her blood pressure and ongoing weight loss efforts are her main concerns. Her blood pressure is  still a little elevated in clinic today at 140/78. Typically she does not monitor her BP at home, but states that she is able.  She reports that she started [facility] since her last visit, which has been working well for her. Unfortunately she notes they are on backorder until September.   Regarding her diet, she has been following a food plan which has been successful so long as she remains compliant. Occasionally she may lapse with eating portions larger than she can handle, but she continues to work well with her dietary changes. She has been transitioning into preparing meals for one, as her daughter is starting college soon. Also she has stopped eating fried foods. Her most recent LDL was 65, and her triglycerides were 141.   Past Medical History:  Diagnosis Date   Abnormal Pap smear    Alcohol abuse    Anovulation    Anxiety    Breast cancer 04/05/2016   right breast   Depression    Endometrial hyperplasia    Endometrial polyp    h/o   Epidermal cyst    h/o   Gallbladder problem    GERD (gastroesophageal reflux disease)    H/O hemorrhoids    H/O menorrhagia    High risk HPV infection    Hyperlipidemia    Hypertension    Insomnia    Non-obstructive CAD    minimal non-obstructive CAD noted on coronary CTA in 05/2021   Obesity    Oligomenorrhea    Prediabetes    Restless legs syndrome    Seasonal allergies    Sleep apnea  Type 2 diabetes mellitus    Vitamin D deficiency     Past Surgical History:  Procedure Laterality Date   BREAST LUMPECTOMY Right 05/2016   radiation   BREAST LUMPECTOMY WITH RADIOACTIVE SEED AND SENTINEL LYMPH NODE BIOPSY Right 05/28/2016   Procedure: RADIOACTIVE SEED GUIDED RIGHT BREAST LUMPECTOMY WITH  RIGHT AXILLARY SENTINEL LYMPH NODE BIOPSY;  Surgeon: Candace Loron, MD;  Location: [facility] OR;  Service: General;  Laterality: Right;   CARPAL TUNNEL RELEASE  03/26/2011   right hand   CARPAL TUNNEL RELEASE  05/28/2011   Procedure: CARPAL TUNNEL  RELEASE;  Surgeon: Candace [name];  Location: [facility];  Service: Orthopedics;  Laterality: Left;   CHOLECYSTECTOMY N/A 01/24/2013   Procedure: LAPAROSCOPIC CHOLECYSTECTOMY WITH INTRAOPERATIVE CHOLANGIOGRAM;  Surgeon: Candace Arms. Corliss Skains, MD;  Location: [facility] OR;  Service: General;  Laterality: N/A;   COLONOSCOPY     HYSTEROSCOPY     POLYPECTOMY     TONSILLECTOMY  1967   as child   TRIGGER FINGER RELEASE Right 09/20/2021   Procedure: RIGHT MIDDLE FINGER A-1 PULLEY RELEASE;  Surgeon: Candace Hitch, MD;  Location: [facility];  Service: Orthopedics;  Laterality: Right;   trigger fingers  8 yrs. ago   both done months apart   uterine ablation  06/04/2010   uterine ablation      Current Medications: Current Meds  Medication Sig   anastrozole (ARIMIDEX) 1 MG tablet Take 1 tablet (1 mg total) by mouth daily.   Blood Pressure Monitoring (OMRON 3 SERIES BP MONITOR) DEVI Use as directed   buPROPion (WELLBUTRIN XL) 300 MG 24 hr tablet Take 1 tablet (300 mg total) by mouth in the morning.   Cholecalciferol (VITAMIN D-3) 125 MCG (5000 UT) TABS Take 5,000 Units by mouth daily. (Patient taking differently: Take 50 mg by mouth daily.)   esomeprazole (NEXIUM) 40 MG capsule Take 1 capsule (40 mg total) by mouth daily.   ferrous sulfate 325 (65 FE) MG tablet Take 1 tablet (325 mg total) by mouth daily.   losartan (COZAAR) 50 MG tablet Take 1 tablet (50 mg total) by mouth daily.   metFORMIN (GLUCOPHAGE) 500 MG tablet Take 1 tablet (500 mg total) by mouth 2 (two) times daily with a meal.   Multiple Vitamin (MULTIVITAMIN WITH MINERALS) TABS tablet Take 1 tablet by mouth daily.   rOPINIRole (REQUIP) 1 MG tablet Take 1 tablet (1 mg total) by mouth 1-3 hours before bedtime.   rosuvastatin (CRESTOR) 10 MG tablet Take 1 tablet (10 mg total) by mouth every evening.   triamterene-hydrochlorothiazide (MAXZIDE-25) 37.5-25 MG tablet Take 1 tablet by mouth daily.     Allergies:   Patient has  no known allergies.   Social History   Tobacco Use   Smoking status: Never   Smokeless tobacco: Never  Vaping Use   Vaping Use: Never used  Substance Use Topics   Alcohol use: No    Comment: none since 1996   Drug use: No     Family History: The patient's family history includes Alcohol abuse in her father and mother; Anxiety disorder in her father and mother; Breast cancer (age of onset: 35) in her sister; Dementia in her mother; Depression in her father and mother; Heart attack in her maternal grandmother; Heart disease in her maternal grandfather, maternal grandmother, and mother; Hyperlipidemia in her mother; Hypertension in her mother; Leukemia in her paternal grandmother; Obesity in her mother; Pulmonary fibrosis in her father; Stroke in her mother.  ROS:  Please see the history of present illness.    All other systems reviewed and are negative.  EKGs/Labs/Other Studies Reviewed:    The following studies were reviewed today:  Echocardiogram  05/16/2021:  1. Left ventricular ejection fraction, by estimation, is 60 to 65%. The  left ventricle has normal function. The left ventricle has no regional  wall motion abnormalities. There is mild concentric left ventricular  hypertrophy. Left ventricular diastolic  parameters are consistent with Grade I diastolic dysfunction (impaired  relaxation). The average left ventricular global longitudinal strain is  -16.8 %. The global longitudinal strain is normal.   2. Right ventricular systolic function is normal. The right ventricular  size is normal.   3. Left atrial size was mildly dilated.   4. The mitral valve is normal in structure. Trivial mitral valve  regurgitation. No evidence of mitral stenosis.   5. The aortic valve is tricuspid. Aortic valve regurgitation is not  visualized. Aortic valve sclerosis is present, with no evidence of aortic  valve stenosis.   6. The inferior vena cava is normal in size with greater than 50%   respiratory variability, suggesting right atrial pressure of 3 mmHg.   7. There is a small patent foramen ovale with predominantly left to right  shunting across the atrial septum.   Coronary CT  05/11/2021: FINDINGS: Coronary calcium score: The patient's coronary artery calcium score is 2, which places the patient in the 65th percentile.   Coronary arteries: Normal coronary origins.  Right dominance.   Right Coronary Artery: Normal caliber vessel, gives rise to PDA. No significant plaque or stenosis.   Left Main Coronary Artery: Normal caliber vessel. No significant plaque or stenosis.   Left Anterior Descending Coronary Artery: Normal caliber vessel. No significant plaque or stenosis, trivial calcification. Gives rise to 3 small diagonal branches.   Left Circumflex Artery: Normal caliber vessel. No significant plaque or stenosis. Gives rise to large OM1 branch.   Aorta: Normal size, 28 mm at the mid ascending aorta (level of the PA bifurcation) measured double oblique. No calcifications. No dissection seen in visualized portions of the aorta.   Aortic Valve: No calcifications. Trileaflet.   Other findings:   Normal pulmonary vein drainage into the left atrium.   Normal left atrial appendage without a thrombus.   Normal size of the pulmonary artery.   Normal appearance of the pericardium.   Mild mitral annular calcification.   IMPRESSION: 1. Minimal nonobstructive CAD, CADRADS = 1. There is trivial calcium seen in the proximal LAD but no significant stenosis   2. Coronary calcium score of 2. This was 65th percentile for age and sex matched control.   3. Normal coronary origin with right dominance.   EKG:   EKG is personally reviewed. 10/22/22: NSR, RBBB, QRS dur 134 ms 12/19/2021:  Sinus rhythm. RBBB. QRS duration 134 msec 05/01/21: Sinus rhythm, rate 97 bpm, RBBB 03/01/21 (Dr. Dalbert Garnet): Sinus rhythm, rate 81, RBBB  Imaging studies that I have independently  reviewed today: n/a  Recent Labs: 02/20/2022: ALT 14; BUN 12; Creatinine, Ser 0.76; Potassium 4.1; Sodium 138   Recent Lipid Panel    Component Value Date/Time   CHOL 151 02/20/2022 1009   TRIG 182 (H) 02/20/2022 1009   HDL 53 02/20/2022 1009   CHOLHDL 3.9 04/04/2020 0810   LDLCALC 68 02/20/2022 1009    Physical Exam:    VS:  BP 128/78   Pulse 90   Ht 5' 6.5" (1.689 m)   Wt 238  lb 12.8 oz (108.3 kg)   SpO2 97%   BMI 37.97 kg/m     Wt Readings from Last 5 Encounters:  10/22/22 238 lb 12.8 oz (108.3 kg)  10/01/22 235 lb (106.6 kg)  09/20/22 240 lb 1.6 oz (108.9 kg)  08/29/22 234 lb (106.1 kg)  07/31/22 235 lb (106.6 kg)    Constitutional: No acute distress Eyes: sclera non-icteric, normal conjunctiva and lids ENMT: normal dentition, moist mucous membranes Cardiovascular: regular rhythm, normal rate, no murmur. S1 and S2 normal. No jugular venous distention.  Respiratory: clear to auscultation bilaterally GI : normal bowel sounds, soft and nontender. No distention.   MSK: extremities warm, well perfused. No edema.  NEURO: grossly nonfocal exam, moves all extremities. PSYCH: alert and oriented x 3, normal mood and affect.   ASSESSMENT:    1. Primary hypertension   2. Hyperlipidemia, unspecified hyperlipidemia type   3. History of chest pain   4. Type 2 diabetes mellitus with complication, without long-term current use of insulin   5. Right bundle branch block   6. Non-obstructive CAD   7. Obstructive sleep apnea      PLAN:    Primary hypertension - Plan: EKG 12-Lead Med management -losartan 50 mg -Triamterene HCTZ 37.5-25 mg  Hyperlipidemia, unspecified hyperlipidemia type -lipids reasonably well-controlled on crestor 10 mg daily.  Discussed management of triglycerides in the setting of diabetes.  History of chest pain - improved, no worrisome cardiac cause identified.   Type 2 diabetes mellitus with complication, without long-term current use of insulin  (HCC) - working with PCP and [facility] center for management.  Previously on Wegovy but not available consistently.  Right bundle branch block - no red flag symptoms of advancing conduction system disease.  Stable EKG.  Total time of encounter: 30 minutes total time of encounter, including 20 minutes spent in face-to-face patient care on the date of this encounter. This time includes coordination of care and counseling regarding above mentioned problem list. Remainder of non-face-to-face time involved reviewing chart documents/testing relevant to the patient encounter and documentation in the medical record. I have independently reviewed documentation from referring provider.   Weston Brass, MD, [facility]  [facility]   Shared Decision Making/Informed Consent:       Medication Adjustments/Labs and Tests Ordered: Current medicines are reviewed at length with the patient today.  Concerns regarding medicines are outlined above.   No orders of the defined types were placed in this encounter.  No orders of the defined types were placed in this encounter.  Patient Instructions  Medication Instructions:  No Changes In Medications at this time.  *If you need a refill on your cardiac medications before your next appointment, please call your pharmacy*  Follow-Up: At [facility], you and your health needs are our priority.  As part of our continuing mission to provide you with exceptional heart care, we have created designated Provider Care Teams.  These Care Teams include your primary Cardiologist (physician) and Advanced Practice Providers (APPs -  Physician Assistants and Nurse Practitioners) who all work together to provide you with the care you need, when you need it.  Your next appointment:   1 year(s)  Provider:   Parke Poisson, MD

## 2022-10-25 DIAGNOSIS — H2513 Age-related nuclear cataract, bilateral: Secondary | ICD-10-CM | POA: Diagnosis not present

## 2022-10-25 DIAGNOSIS — H52223 Regular astigmatism, bilateral: Secondary | ICD-10-CM | POA: Diagnosis not present

## 2022-10-25 DIAGNOSIS — H5213 Myopia, bilateral: Secondary | ICD-10-CM | POA: Diagnosis not present

## 2022-10-25 DIAGNOSIS — E119 Type 2 diabetes mellitus without complications: Secondary | ICD-10-CM | POA: Diagnosis not present

## 2022-10-25 DIAGNOSIS — H524 Presbyopia: Secondary | ICD-10-CM | POA: Diagnosis not present

## 2022-10-31 ENCOUNTER — Encounter (INDEPENDENT_AMBULATORY_CARE_PROVIDER_SITE_OTHER): Payer: Self-pay | Admitting: Family Medicine

## 2022-10-31 ENCOUNTER — Other Ambulatory Visit (HOSPITAL_COMMUNITY): Payer: Self-pay

## 2022-10-31 ENCOUNTER — Ambulatory Visit (INDEPENDENT_AMBULATORY_CARE_PROVIDER_SITE_OTHER): Payer: 59 | Admitting: Family Medicine

## 2022-10-31 VITALS — BP 133/84 | HR 82 | Temp 98.1°F | Ht 66.5 in | Wt 237.0 lb

## 2022-10-31 DIAGNOSIS — E669 Obesity, unspecified: Secondary | ICD-10-CM | POA: Diagnosis not present

## 2022-10-31 DIAGNOSIS — E782 Mixed hyperlipidemia: Secondary | ICD-10-CM

## 2022-10-31 DIAGNOSIS — Z7984 Long term (current) use of oral hypoglycemic drugs: Secondary | ICD-10-CM

## 2022-10-31 DIAGNOSIS — Z6837 Body mass index (BMI) 37.0-37.9, adult: Secondary | ICD-10-CM | POA: Diagnosis not present

## 2022-10-31 DIAGNOSIS — E1169 Type 2 diabetes mellitus with other specified complication: Secondary | ICD-10-CM

## 2022-10-31 MED ORDER — METFORMIN HCL 500 MG PO TABS
500.0000 mg | ORAL_TABLET | Freq: Two times a day (BID) | ORAL | 0 refills | Status: DC
  Filled 2022-10-31: qty 60, 30d supply, fill #0

## 2022-11-04 NOTE — Progress Notes (Signed)
Chief Complaint:   OBESITY Candace [name] is here to discuss her progress with her obesity treatment plan along with follow-up of her obesity related diagnoses. Candace [name] is on the Category 3 Plan and states she is following her eating plan approximately 50% of the time. Candace [name] states she is has been doing some walking.  Today's visit was #: 85 Starting weight: 249 lbs Starting date: 09/17/2017 Today's weight: 237 lbs Today's date: 10/31/2022 Total lbs lost to date: 12 Total lbs lost since last in-office visit: 0  Interim History: Candace [name] continues to work on [employer].  She is still struggling with dinner planning.  She notes sugar cravings after meals.  She has a special fitness journal that she has started to use for tracking.  Subjective:   1. Elevated triglycerides with high cholesterol Candace [name] is working on her diet and weight loss.  Her last triglycerides were above goal.  2. Type 2 diabetes mellitus with other specified complication, without long-term current use of insulin (HCC) Candace [name]'s last A1c was 5.9, her goal is 5.5.  She is doing well on metformin with no nausea or vomiting noted.  Assessment/Plan:   1. Elevated triglycerides with high cholesterol Candace [name] will continue with her diet and decreasing carbohydrates.  We will recheck labs in 1 month.  2. Type 2 diabetes mellitus with other specified complication, without long-term current use of insulin (HCC) Candace [name] will continue metformin, and we will refill for 1 month.  We will recheck fasting labs next month.   - metFORMIN (GLUCOPHAGE) 500 MG tablet; Take 1 tablet (500 mg total) by mouth 2 (two) times daily with a meal.  Dispense: 60 tablet; Refill: 0  3. BMI 37.0-37.9, adult  4. Obesity, Beginning BMI 39.0 Candace [name] is currently in the action stage of change. As such, her goal is to continue with weight loss efforts. She has agreed to the Category 3 Plan and keeping a food journal and adhering to recommended goals of  350-500 calories and 35+ grams of protein at supper daily.    We will recheck fasting labs at her next visit.   Exercise goals: As is.   Behavioral modification strategies: increasing lean protein intake and no skipping meals.  Candace [name] has agreed to follow-up with our clinic in 4 weeks. She was informed of the importance of frequent follow-up visits to maximize her success with intensive lifestyle modifications for her multiple health conditions.   Objective:   Blood pressure 133/84, pulse 82, temperature 98.1 F (36.7 C), height 5' 6.5" (1.689 m), weight 237 lb (107.5 kg), SpO2 97 %. Body mass index is 37.68 kg/m.  Lab Results  Component Value Date   CREATININE 0.76 02/20/2022   BUN 12 02/20/2022   NA 138 02/20/2022   K 4.1 02/20/2022   CL 99 02/20/2022   CO2 26 02/20/2022   Lab Results  Component Value Date   ALT 14 02/20/2022   AST 12 02/20/2022   ALKPHOS 85 02/20/2022   BILITOT 0.3 02/20/2022   Lab Results  Component Value Date   HGBA1C 5.9 (H) 02/20/2022   HGBA1C 6.3 (H) 09/18/2021   HGBA1C 6.2 (H) 12/05/2020   HGBA1C 6.2 (H) 04/04/2020   HGBA1C 6.0 (H) 09/08/2019   Lab Results  Component Value Date   INSULIN 11.7 02/20/2022   INSULIN 18.8 09/18/2021   INSULIN 13.5 12/05/2020   INSULIN 22.9 04/04/2020   INSULIN 12.6 09/08/2019   Lab Results  Component Value Date   TSH 2.200 12/05/2020  Lab Results  Component Value Date   CHOL 151 02/20/2022   HDL 53 02/20/2022   LDLCALC 68 02/20/2022   TRIG 182 (H) 02/20/2022   CHOLHDL 3.9 04/04/2020   Lab Results  Component Value Date   VD25OH 62.2 02/20/2022   VD25OH 71.0 09/18/2021   VD25OH 60.1 12/05/2020   Lab Results  Component Value Date   WBC 7.1 04/15/2019   HGB 13.4 04/15/2019   HCT 41.4 04/15/2019   MCV 83.8 04/15/2019   PLT 297 04/15/2019   No results found for: "IRON", "TIBC", "FERRITIN"  Attestation Statements:   Reviewed by clinician on day of visit: allergies, medications, problem  list, medical history, surgical history, family history, social history, and previous encounter notes.  I have personally spent 40 minutes total time today in preparation, patient care, and documentation for this visit, including the following: review of clinical lab tests; review of medical tests/procedures/services.   I, Burt Knack, am acting as transcriptionist for Quillian Quince, MD.  I have reviewed the above documentation for accuracy and completeness, and I agree with the above. -  Quillian Quince, MD

## 2022-11-20 ENCOUNTER — Other Ambulatory Visit (HOSPITAL_COMMUNITY): Payer: Self-pay

## 2022-11-20 ENCOUNTER — Other Ambulatory Visit: Payer: Self-pay | Admitting: Adult Health

## 2022-11-20 ENCOUNTER — Other Ambulatory Visit: Payer: Self-pay

## 2022-11-20 MED ORDER — ANASTROZOLE 1 MG PO TABS
1.0000 mg | ORAL_TABLET | Freq: Every day | ORAL | 6 refills | Status: DC
  Filled 2022-11-20: qty 90, 90d supply, fill #0
  Filled 2023-03-17: qty 90, 90d supply, fill #1
  Filled 2023-07-11: qty 90, 90d supply, fill #2
  Filled 2023-11-06: qty 90, 90d supply, fill #3

## 2022-12-04 ENCOUNTER — Other Ambulatory Visit (HOSPITAL_COMMUNITY): Payer: Self-pay

## 2022-12-04 ENCOUNTER — Encounter (INDEPENDENT_AMBULATORY_CARE_PROVIDER_SITE_OTHER): Payer: Self-pay | Admitting: Family Medicine

## 2022-12-04 ENCOUNTER — Ambulatory Visit (INDEPENDENT_AMBULATORY_CARE_PROVIDER_SITE_OTHER): Payer: 59 | Admitting: Family Medicine

## 2022-12-04 VITALS — BP 131/79 | HR 76 | Temp 98.6°F | Ht 66.5 in | Wt 238.0 lb

## 2022-12-04 DIAGNOSIS — Z7984 Long term (current) use of oral hypoglycemic drugs: Secondary | ICD-10-CM

## 2022-12-04 DIAGNOSIS — E559 Vitamin D deficiency, unspecified: Secondary | ICD-10-CM | POA: Diagnosis not present

## 2022-12-04 DIAGNOSIS — Z6837 Body mass index (BMI) 37.0-37.9, adult: Secondary | ICD-10-CM | POA: Diagnosis not present

## 2022-12-04 DIAGNOSIS — E7849 Other hyperlipidemia: Secondary | ICD-10-CM

## 2022-12-04 DIAGNOSIS — E669 Obesity, unspecified: Secondary | ICD-10-CM | POA: Diagnosis not present

## 2022-12-04 DIAGNOSIS — E1169 Type 2 diabetes mellitus with other specified complication: Secondary | ICD-10-CM | POA: Diagnosis not present

## 2022-12-04 DIAGNOSIS — D508 Other iron deficiency anemias: Secondary | ICD-10-CM

## 2022-12-04 DIAGNOSIS — Z6838 Body mass index (BMI) 38.0-38.9, adult: Secondary | ICD-10-CM | POA: Insufficient documentation

## 2022-12-04 MED ORDER — METFORMIN HCL 500 MG PO TABS
500.0000 mg | ORAL_TABLET | Freq: Two times a day (BID) | ORAL | 0 refills | Status: DC
  Filled 2022-12-04: qty 60, 30d supply, fill #0

## 2022-12-04 NOTE — Progress Notes (Signed)
Chief Complaint:   OBESITY Candace [name] is here to discuss her progress with her obesity treatment plan along with follow-up of her obesity related diagnoses. Candace [name] is on the Category 3 Plan and keeping a food journal and adhering to recommended goals of 350-500 calories and 35+ grams of protein at supper and states she is following her eating plan approximately 75% of the time. Candace [name] states she is walking more for 60 minutes 3 times per week.  Today's visit was #: 86 Starting weight: 249 lbs Starting date: 09/17/2017 Today's weight: 238 lbs Today's date: 12/04/2022 Total lbs lost to date: 11 Total lbs lost since last in-office visit: 0  Interim History: Patient is up 1 pound but this appears to be due to water weight.  She has increased walking including walking hills to help with strengthening.  Subjective:   1. Type 2 diabetes mellitus with other specified complication, without long-term current use of insulin (HCC) Patient continues to work on her diet.  She is working on increasing activity.  She denies nausea or vomiting.  2. Vitamin D deficiency Patient is on vitamin D, and she is due for labs.  3. Other iron deficiency anemia Patient has a history of iron deficiency, and she is on iron supplementation.  4. Other hyperlipidemia Patient's triglycerides were elevated on her last lab, and she is working on decreasing simple carbohydrates.  She denies abdominal pain.  Assessment/Plan:   1. Type 2 diabetes mellitus with other specified complication, without long-term current use of insulin (HCC) We will check labs today, and we will refill metformin for 1 month.   - metFORMIN (GLUCOPHAGE) 500 MG tablet; Take 1 tablet (500 mg total) by mouth 2 (two) times daily with a meal.  Dispense: 60 tablet; Refill: 0 - Vitamin B12 - CBC with Differential/Platelet - CMP14+EGFR - Insulin, random - Hemoglobin A1c - TSH  2. Vitamin D deficiency We will check labs today. Patient will  continue Vitamin D.   - VITAMIN D 25 Hydroxy (Vit-D Deficiency, Fractures)  3. Other iron deficiency anemia We will check labs today, and we will follow-up at her next visit. Patient will continue iron supplementation.   - Folate - Iron and TIBC - Ferritin  4. Other hyperlipidemia We will check labs today. Patient will continue with her diet an Crestor.   - Lipid Panel With LDL/HDL Ratio  5. BMI 38.0-38.9,adult  6. Obesity, Beginnin BMI 39 Candace [name] is currently in the action stage of change. As such, her goal is to continue with weight loss efforts. She has agreed to the Category 3 Plan.   Exercise goals: As is.   Behavioral modification strategies: increasing lean protein intake and meal planning and cooking strategies.  Candace [name] has agreed to follow-up with our clinic in 4 weeks. She was informed of the importance of frequent follow-up visits to maximize her success with intensive lifestyle modifications for her multiple health conditions.   Candace [name] was informed we would discuss her lab results at her next visit unless there is a critical issue that needs to be addressed sooner. Candace [name] agreed to keep her next visit at the agreed upon time to discuss these results.  Objective:   Blood pressure 131/79, pulse 76, temperature 98.6 F (37 C), height 5' 6.5" (1.689 m), weight 238 lb (108 kg), SpO2 98 %. Body mass index is 37.84 kg/m.  Lab Results  Component Value Date   CREATININE 0.76 02/20/2022   BUN 12 02/20/2022   NA 138 02/20/2022  K 4.1 02/20/2022   CL 99 02/20/2022   CO2 26 02/20/2022   Lab Results  Component Value Date   ALT 14 02/20/2022   AST 12 02/20/2022   ALKPHOS 85 02/20/2022   BILITOT 0.3 02/20/2022   Lab Results  Component Value Date   HGBA1C 5.9 (H) 02/20/2022   HGBA1C 6.3 (H) 09/18/2021   HGBA1C 6.2 (H) 12/05/2020   HGBA1C 6.2 (H) 04/04/2020   HGBA1C 6.0 (H) 09/08/2019   Lab Results  Component Value Date   INSULIN 11.7 02/20/2022   INSULIN 18.8  09/18/2021   INSULIN 13.5 12/05/2020   INSULIN 22.9 04/04/2020   INSULIN 12.6 09/08/2019   Lab Results  Component Value Date   TSH 2.200 12/05/2020   Lab Results  Component Value Date   CHOL 151 02/20/2022   HDL 53 02/20/2022   LDLCALC 68 02/20/2022   TRIG 182 (H) 02/20/2022   CHOLHDL 3.9 04/04/2020   Lab Results  Component Value Date   VD25OH 62.2 02/20/2022   VD25OH 71.0 09/18/2021   VD25OH 60.1 12/05/2020   Lab Results  Component Value Date   WBC 7.1 04/15/2019   HGB 13.4 04/15/2019   HCT 41.4 04/15/2019   MCV 83.8 04/15/2019   PLT 297 04/15/2019   No results found for: "IRON", "TIBC", "FERRITIN"  Attestation Statements:   Reviewed by clinician on day of visit: allergies, medications, problem list, medical history, surgical history, family history, social history, and previous encounter notes.   I, Burt Knack, am acting as transcriptionist for Quillian Quince, MD.  I have reviewed the above documentation for accuracy and completeness, and I agree with the above. -  Quillian Quince, MD

## 2022-12-05 LAB — INSULIN, RANDOM: INSULIN: 7.7 u[IU]/mL (ref 2.6–24.9)

## 2022-12-05 LAB — CMP14+EGFR
Alkaline Phosphatase: 81 IU/L (ref 44–121)
Bilirubin Total: 0.4 mg/dL (ref 0.0–1.2)
Calcium: 9.7 mg/dL (ref 8.7–10.3)
Globulin, Total: 2.1 g/dL (ref 1.5–4.5)

## 2022-12-05 LAB — HEMOGLOBIN A1C: Est. average glucose Bld gHb Est-mCnc: 128 mg/dL

## 2022-12-05 LAB — TSH: TSH: 2.05 u[IU]/mL (ref 0.450–4.500)

## 2022-12-06 LAB — CMP14+EGFR
ALT: 17 IU/L (ref 0–32)
AST: 15 IU/L (ref 0–40)
Albumin/Globulin Ratio: 2.1 (ref 1.2–2.2)
Albumin: 4.4 g/dL (ref 3.9–4.9)
BUN/Creatinine Ratio: 14 (ref 12–28)
BUN: 10 mg/dL (ref 8–27)
CO2: 26 mmol/L (ref 20–29)
Chloride: 103 mmol/L (ref 96–106)
Creatinine, Ser: 0.71 mg/dL (ref 0.57–1.00)
Glucose: 91 mg/dL (ref 70–99)
Potassium: 4.3 mmol/L (ref 3.5–5.2)
Sodium: 142 mmol/L (ref 134–144)
Total Protein: 6.5 g/dL (ref 6.0–8.5)
eGFR: 97 mL/min/{1.73_m2} (ref >59)

## 2022-12-06 LAB — LIPID PANEL WITH LDL/HDL RATIO
Cholesterol, Total: 164 mg/dL (ref 100–199)
HDL: 49 mg/dL (ref >39)
LDL Chol Calc (NIH): 82 mg/dL (ref 0–99)
LDL/HDL Ratio: 1.7 ratio (ref 0.0–3.2)
Triglycerides: 193 mg/dL — ABNORMAL HIGH (ref 0–149)
VLDL Cholesterol Cal: 33 mg/dL (ref 5–40)

## 2022-12-06 LAB — CBC WITH DIFFERENTIAL/PLATELET
Basophils Absolute: 0 10*3/uL (ref 0.0–0.2)
Basos: 1 %
EOS (ABSOLUTE): 0.2 10*3/uL (ref 0.0–0.4)
Eos: 3 %
Hematocrit: 40.7 % (ref 34.0–46.6)
Hemoglobin: 13.6 g/dL (ref 11.1–15.9)
Immature Grans (Abs): 0 10*3/uL (ref 0.0–0.1)
Immature Granulocytes: 0 %
Lymphocytes Absolute: 2.5 10*3/uL (ref 0.7–3.1)
Lymphs: 40 %
MCH: 28.6 pg (ref 26.6–33.0)
MCHC: 33.4 g/dL (ref 31.5–35.7)
MCV: 86 fL (ref 79–97)
Monocytes Absolute: 0.6 10*3/uL (ref 0.1–0.9)
Monocytes: 10 %
Neutrophils Absolute: 2.9 10*3/uL (ref 1.4–7.0)
Neutrophils: 46 %
Platelets: 280 10*3/uL (ref 150–450)
RBC: 4.75 x10E6/uL (ref 3.77–5.28)
RDW: 12.2 % (ref 11.7–15.4)
WBC: 6.2 10*3/uL (ref 3.4–10.8)

## 2022-12-06 LAB — VITAMIN B12: Vitamin B-12: 1035 pg/mL (ref 232–1245)

## 2022-12-06 LAB — FERRITIN: Ferritin: 65 ng/mL (ref 15–150)

## 2022-12-06 LAB — HEMOGLOBIN A1C: Hgb A1c MFr Bld: 6.1 % — ABNORMAL HIGH (ref 4.8–5.6)

## 2022-12-06 LAB — IRON AND TIBC
Iron Saturation: 24 % (ref 15–55)
Iron: 82 ug/dL (ref 27–139)
Total Iron Binding Capacity: 343 ug/dL (ref 250–450)
UIBC: 261 ug/dL (ref 118–369)

## 2022-12-06 LAB — VITAMIN D 25 HYDROXY (VIT D DEFICIENCY, FRACTURES): Vit D, 25-Hydroxy: 51.9 ng/mL (ref 30.0–100.0)

## 2022-12-06 LAB — FOLATE: Folate: 15.1 ng/mL (ref >3.0)

## 2022-12-12 ENCOUNTER — Other Ambulatory Visit (HOSPITAL_COMMUNITY): Payer: Self-pay

## 2022-12-18 ENCOUNTER — Other Ambulatory Visit (HOSPITAL_COMMUNITY): Payer: Self-pay

## 2022-12-19 ENCOUNTER — Other Ambulatory Visit (HOSPITAL_COMMUNITY): Payer: Self-pay

## 2023-01-01 ENCOUNTER — Other Ambulatory Visit (HOSPITAL_COMMUNITY): Payer: Self-pay

## 2023-01-01 ENCOUNTER — Ambulatory Visit (INDEPENDENT_AMBULATORY_CARE_PROVIDER_SITE_OTHER): Payer: 59 | Admitting: Family Medicine

## 2023-01-01 ENCOUNTER — Encounter (INDEPENDENT_AMBULATORY_CARE_PROVIDER_SITE_OTHER): Payer: Self-pay | Admitting: Family Medicine

## 2023-01-01 VITALS — BP 136/81 | HR 91 | Temp 98.7°F | Ht 66.5 in | Wt 235.0 lb

## 2023-01-01 DIAGNOSIS — Z6837 Body mass index (BMI) 37.0-37.9, adult: Secondary | ICD-10-CM

## 2023-01-01 DIAGNOSIS — E669 Obesity, unspecified: Secondary | ICD-10-CM | POA: Diagnosis not present

## 2023-01-01 DIAGNOSIS — Z7984 Long term (current) use of oral hypoglycemic drugs: Secondary | ICD-10-CM

## 2023-01-01 DIAGNOSIS — E1169 Type 2 diabetes mellitus with other specified complication: Secondary | ICD-10-CM | POA: Diagnosis not present

## 2023-01-01 MED ORDER — METFORMIN HCL 500 MG PO TABS
500.0000 mg | ORAL_TABLET | Freq: Two times a day (BID) | ORAL | 0 refills | Status: DC
Start: 2023-01-01 — End: 2023-02-04
  Filled 2023-01-01 – 2023-01-13 (×2): qty 60, 30d supply, fill #0

## 2023-01-01 NOTE — Progress Notes (Signed)
Chief Complaint:   OBESITY Candace [name] is here to discuss her progress with her obesity treatment plan along with follow-up of her obesity related diagnoses. Candace [name] is on the Category 3 Plan and states she is following her eating plan approximately 50% of the time. Candace [name] states she was walking while on vacation.   Today's visit was #: 87 Starting weight: 249 lbs Starting date: 09/17/2017 Today's weight: 235 lbs Today's date: 01/01/2023 Total lbs lost to date: 14 Total lbs lost since last in-office visit: 3  Interim History: Patient has done well with her weight loss even while on vacation.  She was active and did a lot.  She did well with using travel strategies.  Subjective:   1. Type 2 diabetes mellitus with other specified complication, without long-term current use of insulin (HCC) Patient is doing well with her diet and weight loss.  Her last A1c was slightly elevated at 6.1 from 5.9.  Assessment/Plan:   1. Type 2 diabetes mellitus with other specified complication, without long-term current use of insulin (HCC) Patient will continue metformin 500 mg twice daily, and we will refill for 1 month.  - metFORMIN (GLUCOPHAGE) 500 MG tablet; Take 1 tablet (500 mg total) by mouth 2 (two) times daily with a meal.  Dispense: 60 tablet; Refill: 0  2. BMI 37.0-37.9, adult  3. Obesity, Beginnin BMI 39 Candace [name] is currently in the action stage of change. As such, her goal is to continue with weight loss efforts. She has agreed to the Category 3 Plan.   Behavioral modification strategies: increasing lean protein intake and travel eating strategies.  Candace [name] has agreed to follow-up with our clinic in 4 weeks. She was informed of the importance of frequent follow-up visits to maximize her success with intensive lifestyle modifications for her multiple health conditions.   Objective:   Blood pressure 136/81, pulse 91, temperature 98.7 F (37.1 C), height 5' 6.5" (1.689 m), weight 235 lb  (106.6 kg), SpO2 96 %. Body mass index is 37.36 kg/m.  Lab Results  Component Value Date   CREATININE 0.71 12/04/2022   BUN 10 12/04/2022   NA 142 12/04/2022   K 4.3 12/04/2022   CL 103 12/04/2022   CO2 26 12/04/2022   Lab Results  Component Value Date   ALT 17 12/04/2022   AST 15 12/04/2022   ALKPHOS 81 12/04/2022   BILITOT 0.4 12/04/2022   Lab Results  Component Value Date   HGBA1C 6.1 (H) 12/04/2022   HGBA1C 5.9 (H) 02/20/2022   HGBA1C 6.3 (H) 09/18/2021   HGBA1C 6.2 (H) 12/05/2020   HGBA1C 6.2 (H) 04/04/2020   Lab Results  Component Value Date   INSULIN 7.7 12/04/2022   INSULIN 11.7 02/20/2022   INSULIN 18.8 09/18/2021   INSULIN 13.5 12/05/2020   INSULIN 22.9 04/04/2020   Lab Results  Component Value Date   TSH 2.050 12/04/2022   Lab Results  Component Value Date   CHOL 164 12/04/2022   HDL 49 12/04/2022   LDLCALC 82 12/04/2022   TRIG 193 (H) 12/04/2022   CHOLHDL 3.9 04/04/2020   Lab Results  Component Value Date   VD25OH 51.9 12/04/2022   VD25OH 62.2 02/20/2022   VD25OH 71.0 09/18/2021   Lab Results  Component Value Date   WBC 6.2 12/04/2022   HGB 13.6 12/04/2022   HCT 40.7 12/04/2022   MCV 86 12/04/2022   PLT 280 12/04/2022   Lab Results  Component Value Date   IRON 82 12/04/2022  TIBC 343 12/04/2022   FERRITIN 65 12/04/2022   Attestation Statements:   Reviewed by clinician on day of visit: allergies, medications, problem list, medical history, surgical history, family history, social history, and previous encounter notes.  Time spent on visit including pre-visit chart review and post-visit care and charting was 30 minutes.   I, Burt Knack, am acting as transcriptionist for Quillian Quince, MD.  I have reviewed the above documentation for accuracy and completeness, and I agree with the above. -  Quillian Quince, MD

## 2023-01-07 ENCOUNTER — Other Ambulatory Visit: Payer: Self-pay | Admitting: Family Medicine

## 2023-01-07 DIAGNOSIS — Z01419 Encounter for gynecological examination (general) (routine) without abnormal findings: Secondary | ICD-10-CM | POA: Diagnosis not present

## 2023-01-07 DIAGNOSIS — Z1231 Encounter for screening mammogram for malignant neoplasm of breast: Secondary | ICD-10-CM

## 2023-01-07 DIAGNOSIS — E119 Type 2 diabetes mellitus without complications: Secondary | ICD-10-CM | POA: Diagnosis not present

## 2023-01-13 ENCOUNTER — Other Ambulatory Visit (HOSPITAL_COMMUNITY): Payer: Self-pay

## 2023-01-23 ENCOUNTER — Ambulatory Visit
Admission: RE | Admit: 2023-01-23 | Discharge: 2023-01-23 | Disposition: A | Discharge status: Home / Self Care | Payer: 59 | Source: Ambulatory Visit | Attending: Family Medicine | Admitting: Family Medicine

## 2023-01-23 DIAGNOSIS — Z1231 Encounter for screening mammogram for malignant neoplasm of breast: Secondary | ICD-10-CM

## 2023-01-29 ENCOUNTER — Ambulatory Visit (INDEPENDENT_AMBULATORY_CARE_PROVIDER_SITE_OTHER): Payer: 59 | Admitting: Family Medicine

## 2023-02-04 ENCOUNTER — Encounter (INDEPENDENT_AMBULATORY_CARE_PROVIDER_SITE_OTHER): Payer: Self-pay | Admitting: Family Medicine

## 2023-02-04 ENCOUNTER — Ambulatory Visit (INDEPENDENT_AMBULATORY_CARE_PROVIDER_SITE_OTHER): Payer: 59 | Admitting: Family Medicine

## 2023-02-04 ENCOUNTER — Other Ambulatory Visit (HOSPITAL_COMMUNITY): Payer: Self-pay

## 2023-02-04 VITALS — BP 124/73 | HR 89 | Temp 98.2°F | Ht 66.5 in | Wt 240.0 lb

## 2023-02-04 DIAGNOSIS — F3289 Other specified depressive episodes: Secondary | ICD-10-CM

## 2023-02-04 DIAGNOSIS — R7303 Prediabetes: Secondary | ICD-10-CM | POA: Diagnosis not present

## 2023-02-04 DIAGNOSIS — E669 Obesity, unspecified: Secondary | ICD-10-CM

## 2023-02-04 DIAGNOSIS — Z6838 Body mass index (BMI) 38.0-38.9, adult: Secondary | ICD-10-CM | POA: Diagnosis not present

## 2023-02-04 MED ORDER — METFORMIN HCL 500 MG PO TABS
500.0000 mg | ORAL_TABLET | Freq: Two times a day (BID) | ORAL | 0 refills | Status: DC
Start: 2023-02-04 — End: 2023-03-05
  Filled 2023-02-04 – 2023-02-12 (×2): qty 60, 30d supply, fill #0

## 2023-02-04 NOTE — Progress Notes (Signed)
Chief Complaint:   OBESITY Candace [name] is here to discuss her progress with her obesity treatment plan along with follow-up of her obesity related diagnoses. Candace [name] is on the Category 3 Plan and states she is following her eating plan approximately 50% of the time. Candace [name] states she is doing 0 minutes 0 times per week.  Today's visit was #: 88 Starting weight: 249 lbs Starting date: 09/17/2017 Today's weight: 240 lbs Today's date: 02/04/2023 Total lbs lost to date: 9 Total lbs lost since last in-office visit: 0  Interim History: Patient has struggled to stay on track this last month.  She notes meal planning has fallen away more, but she has still been mindful of her food choices.  Subjective:   1. Pre-diabetes Patient is stable on metformin with no GI upset or hypoglycemia.  She continues to work on decreasing simple carbohydrates.  2. Emotional Eating Behavior Patient is on Wellbutrin but she is off Prozac due to her RLS which she feels is improving.  She notes crying easier which is not normal for her.  Assessment/Plan:   1. Pre-diabetes Patient will continue metformin 500 mg twice daily, we will refill for 1 month.  Meal planning strategies were discussed.  - metFORMIN (GLUCOPHAGE) 500 MG tablet; Take 1 tablet (500 mg total) by mouth 2 (two) times daily with a meal.  Dispense: 60 tablet; Refill: 0  2. Emotional Eating Behavior Emotional eating behavior strategies were discussed with the patient.  We will continue to weigh risk versus benefits of Prozac in 1 month.  3. BMI 38.0-38.9,adult  4. Obesity, Beginning BMI 39.0 Candace [name] is currently in the action stage of change. As such, her goal is to continue with weight loss efforts. She has agreed to the Category 3 Plan.   Behavioral modification strategies: increasing lean protein intake and meal planning and cooking strategies.  Candace [name] has agreed to follow-up with our clinic in 4 weeks. She was informed of the importance of  frequent follow-up visits to maximize her success with intensive lifestyle modifications for her multiple health conditions.   Objective:   Blood pressure 124/73, pulse 89, temperature 98.2 F (36.8 C), height 5' 6.5" (1.689 m), weight 240 lb (108.9 kg), SpO2 97%. Body mass index is 38.16 kg/m.  Lab Results  Component Value Date   CREATININE 0.71 12/04/2022   BUN 10 12/04/2022   NA 142 12/04/2022   K 4.3 12/04/2022   CL 103 12/04/2022   CO2 26 12/04/2022   Lab Results  Component Value Date   ALT 17 12/04/2022   AST 15 12/04/2022   ALKPHOS 81 12/04/2022   BILITOT 0.4 12/04/2022   Lab Results  Component Value Date   HGBA1C 6.1 (H) 12/04/2022   HGBA1C 5.9 (H) 02/20/2022   HGBA1C 6.3 (H) 09/18/2021   HGBA1C 6.2 (H) 12/05/2020   HGBA1C 6.2 (H) 04/04/2020   Lab Results  Component Value Date   INSULIN 7.7 12/04/2022   INSULIN 11.7 02/20/2022   INSULIN 18.8 09/18/2021   INSULIN 13.5 12/05/2020   INSULIN 22.9 04/04/2020   Lab Results  Component Value Date   TSH 2.050 12/04/2022   Lab Results  Component Value Date   CHOL 164 12/04/2022   HDL 49 12/04/2022   LDLCALC 82 12/04/2022   TRIG 193 (H) 12/04/2022   CHOLHDL 3.9 04/04/2020   Lab Results  Component Value Date   VD25OH 51.9 12/04/2022   VD25OH 62.2 02/20/2022   VD25OH 71.0 09/18/2021   Lab Results  Component Value Date   WBC 6.2 12/04/2022   HGB 13.6 12/04/2022   HCT 40.7 12/04/2022   MCV 86 12/04/2022   PLT 280 12/04/2022   Lab Results  Component Value Date   IRON 82 12/04/2022   TIBC 343 12/04/2022   FERRITIN 65 12/04/2022   Attestation Statements:   Reviewed by clinician on day of visit: allergies, medications, problem list, medical history, surgical history, family history, social history, and previous encounter notes.   I, Burt Knack, am acting as transcriptionist for Quillian Quince, MD.  I have reviewed the above documentation for accuracy and completeness, and I agree with the above.  -  Quillian Quince, MD

## 2023-02-06 IMAGING — MG DIGITAL DIAGNOSTIC BILAT W/ TOMO W/ CAD
6 of 10 series · 6 of 30 positions shown · non-contrast
Comparison: Previous exam(s).

CLINICAL DATA: Patient reports a decreased size of the right
breast. She has had a recent, approximally 12 pound, weight loss.
She has had a right lumpectomy for breast carcinoma performed
May 2016 treated with adjuvant radiation therapy. She is
currently taking anastrozole.

EXAM:
DIGITAL DIAGNOSTIC BILATERAL MAMMOGRAM WITH TOMOSYNTHESIS AND CAD
TECHNIQUE: Bilateral digital diagnostic mammography and breast tomosynthesis
was performed. The images were evaluated with computer-aided
detection.

[L CC synth-2D (1 of 2)]
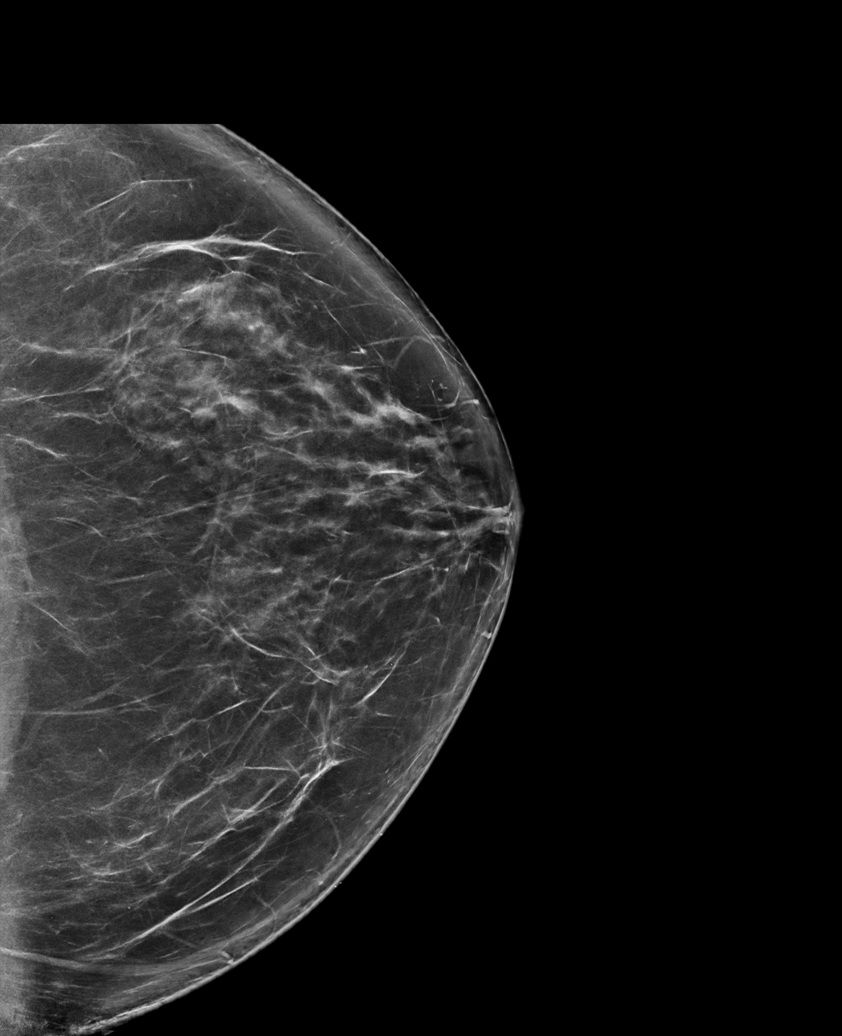

[L MLO synth-2D]
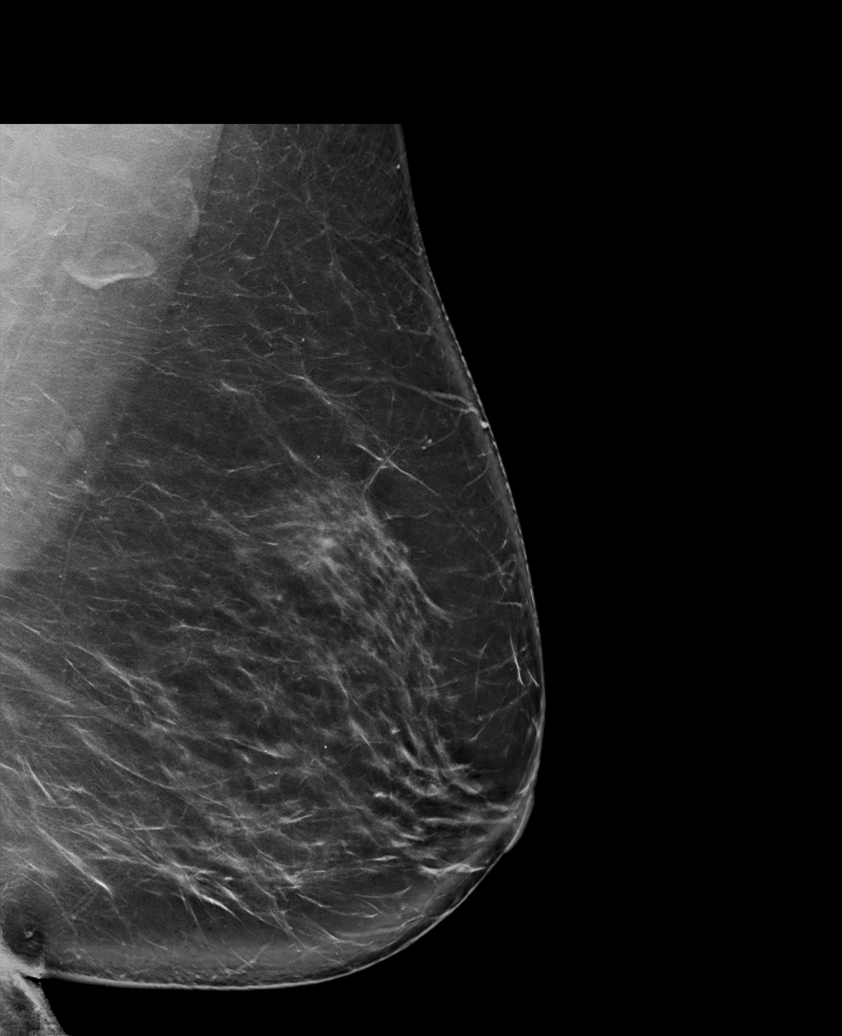

[R MLO synth-2D]
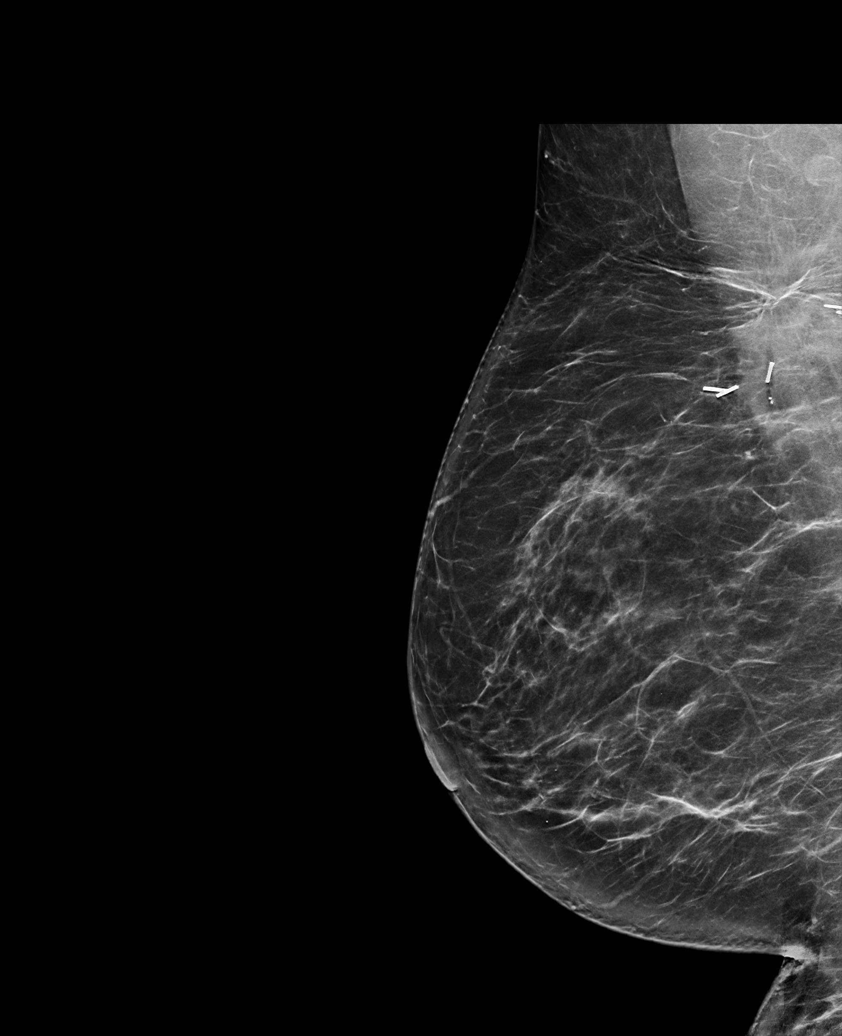

[R CC synth-2D]
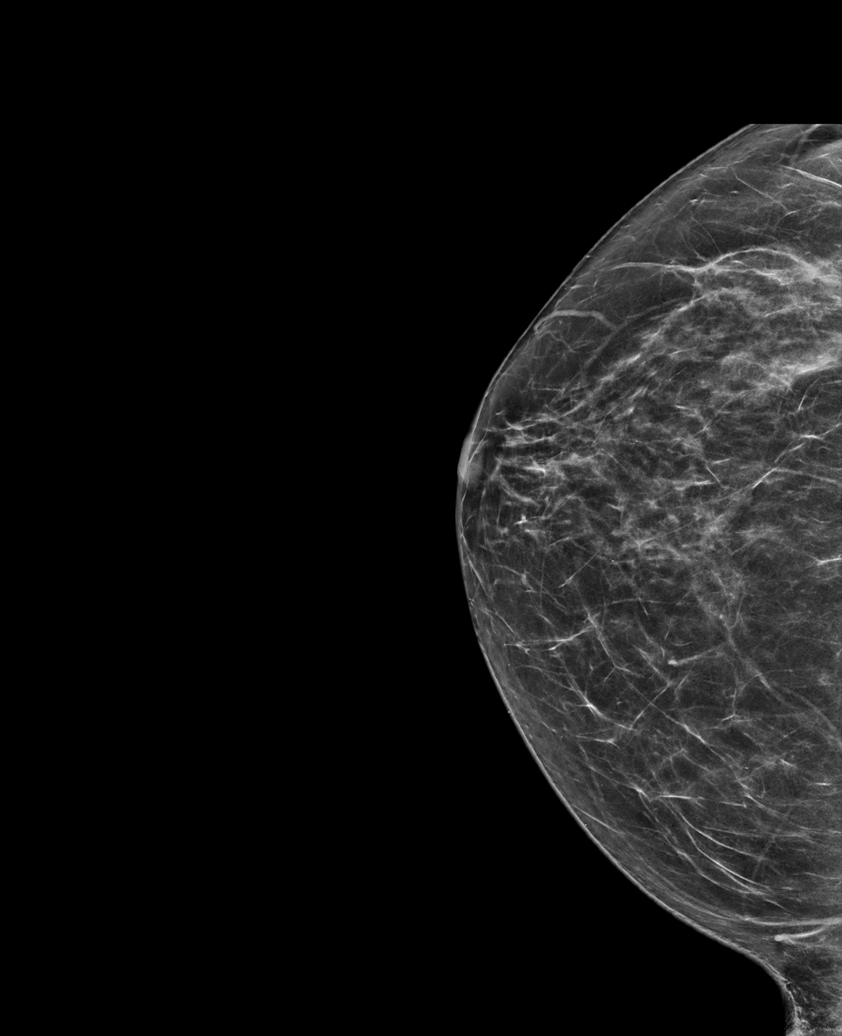

[L CC synth-2D (2 of 2)]
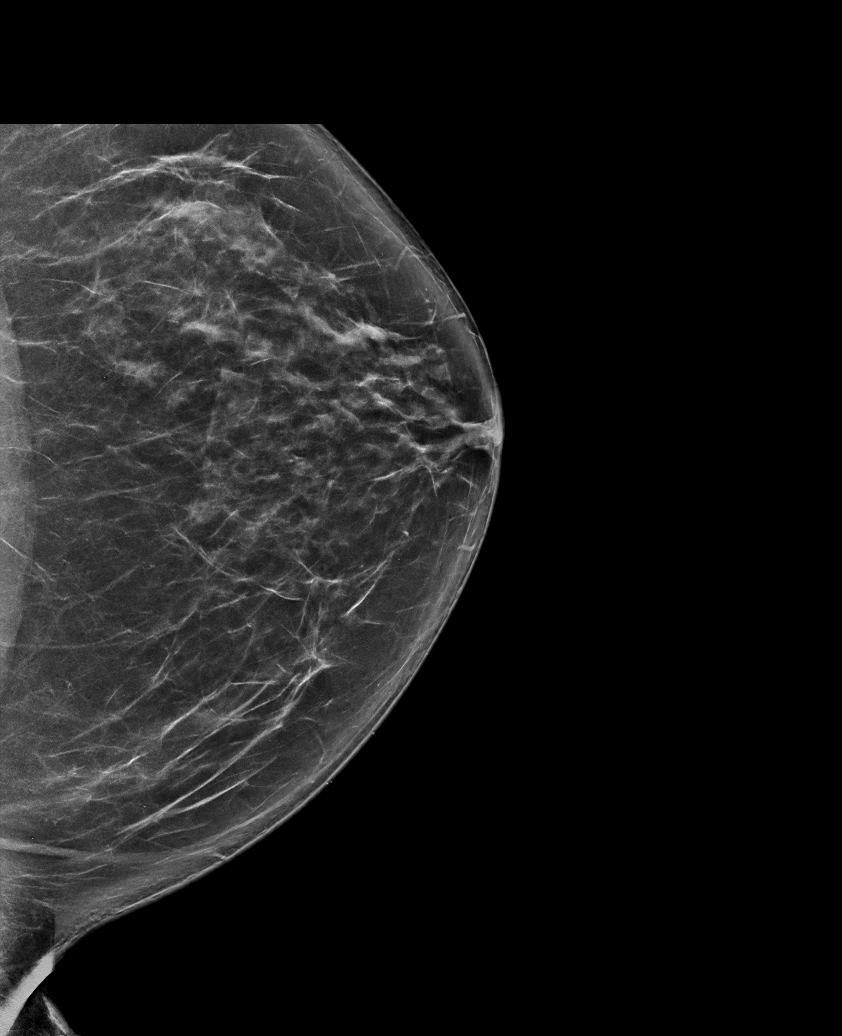

[R MLO tomo · tomo slice 45/89.0]
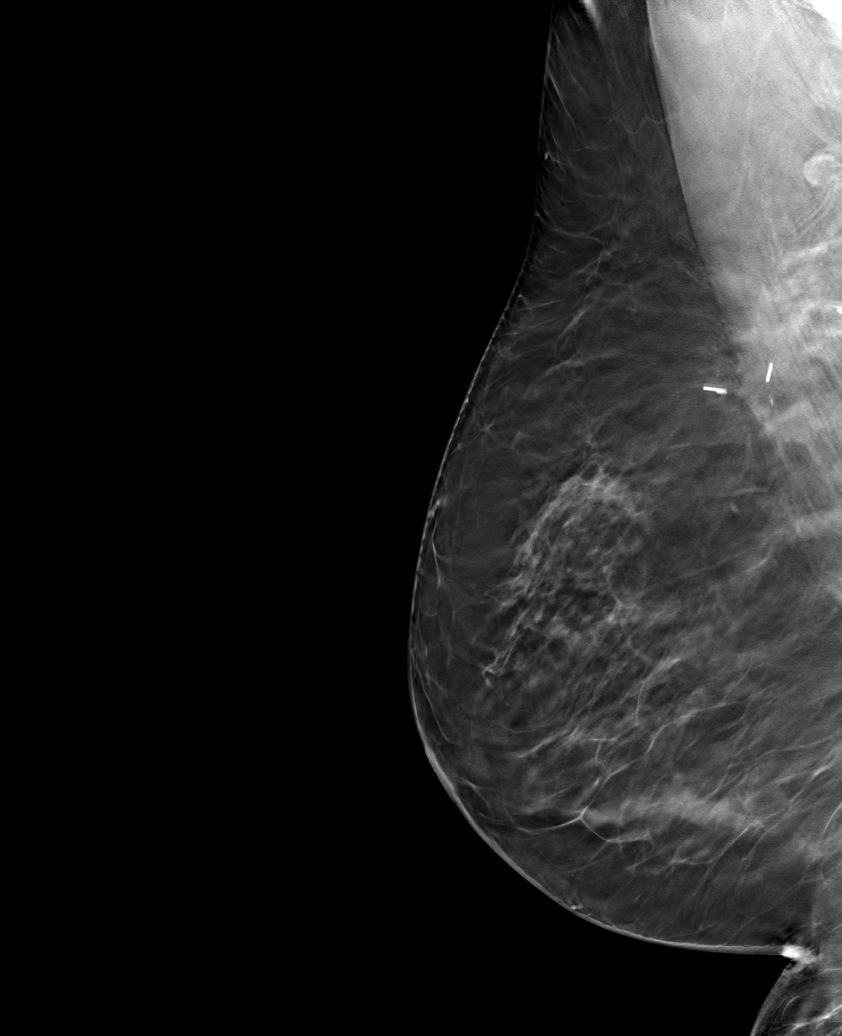

[6 of 30 positions shown; findings below may reference images not displayed]

ACR Breast Density Category b: There are scattered areas of
fibroglandular density.
FINDINGS: Although subtle trauma there has been a decrease breast size,
bilaterally, since the exam dated 04/28/2020.

There are no breast masses. Postsurgical changes in the axillary
tail region of the right breast are stable. There are no areas of
nonsurgical architectural distortion. There are no suspicious
calcifications.
IMPRESSION: 1. No evidence of new or recurrent breast carcinoma.
2. Benign post lumpectomy changes the axillary tail region of the
right breast.
3. Weight loss with mild interval decrease bilateral breast fat.

RECOMMENDATION:
Screening mammogram in one year.(Code:HI-L-54B)

I have discussed the findings and recommendations with the patient.
If applicable, a reminder letter will be sent to the patient
regarding the next appointment.

BI-RADS CATEGORY  2: Benign.

## 2023-02-12 ENCOUNTER — Other Ambulatory Visit (HOSPITAL_COMMUNITY): Payer: Self-pay

## 2023-03-05 ENCOUNTER — Encounter (INDEPENDENT_AMBULATORY_CARE_PROVIDER_SITE_OTHER): Payer: Self-pay | Admitting: Family Medicine

## 2023-03-05 ENCOUNTER — Other Ambulatory Visit (HOSPITAL_COMMUNITY): Payer: Self-pay

## 2023-03-05 ENCOUNTER — Ambulatory Visit (INDEPENDENT_AMBULATORY_CARE_PROVIDER_SITE_OTHER): Payer: 59 | Admitting: Family Medicine

## 2023-03-05 VITALS — BP 141/87 | HR 87 | Temp 98.0°F | Ht 66.5 in | Wt 240.0 lb

## 2023-03-05 DIAGNOSIS — Z6838 Body mass index (BMI) 38.0-38.9, adult: Secondary | ICD-10-CM

## 2023-03-05 DIAGNOSIS — E669 Obesity, unspecified: Secondary | ICD-10-CM | POA: Diagnosis not present

## 2023-03-05 DIAGNOSIS — F3289 Other specified depressive episodes: Secondary | ICD-10-CM | POA: Diagnosis not present

## 2023-03-05 DIAGNOSIS — R7303 Prediabetes: Secondary | ICD-10-CM | POA: Diagnosis not present

## 2023-03-05 MED ORDER — METFORMIN HCL 500 MG PO TABS
500.0000 mg | ORAL_TABLET | Freq: Two times a day (BID) | ORAL | 0 refills | Status: DC
  Filled 2023-03-05 – 2023-03-19 (×3): qty 60, 30d supply, fill #0

## 2023-03-05 NOTE — Progress Notes (Unsigned)
Chief Complaint:   OBESITY Candace [name] is here to discuss her progress with her obesity treatment plan along with follow-up of her obesity related diagnoses. Candace [name] is on the Category 3 Plan and states she is following her eating plan approximately 70% of the time. Candace [name] states she is doing 0 minutes 0 times per week.  Today's visit was #: 89 Starting weight: 249 lbs Starting date: 09/17/2017 Today's weight: 240 lbs Today's date: 03/05/2023 Total lbs lost to date: 9 Total lbs lost since last in-office visit: 0  Interim History: Patient has done well with avoiding weight gain.  She has been struggling with sugar cravings.  She is trying to eat less takeout.  Subjective:   1. Pre-diabetes Patient is on metformin, and she is working on her diet but she has struggled more.  2. Emotional Eating Behavior Patient is struggling with sweet cravings.  She notes feeling not in control, and she has done OA.  She states she knows what to do, but she is struggling to make the changes that are needed.  Assessment/Plan:   1. Pre-diabetes We will refill metformin 500 mg twice daily #60 for 1 month.  Patient will continue to work on her diet.  2. Emotional Eating Behavior Emotional eating behavior strategies were discussed with the patient today.  We will continue to follow.  3. BMI 38.0-38.9,adult  4. Obesity, Beginning BMI 39.0 Candace [name] is currently in the action stage of change. As such, her goal is to continue with weight loss efforts. She has agreed to the Category 3 Plan or keeping a food journal and adhering to recommended goals of 1500 calories and 85 grams of protein daily.   Behavioral modification strategies: meal planning and cooking strategies and emotional eating strategies.  Candace [name] has agreed to follow-up with our clinic in 4 weeks. She was informed of the importance of frequent follow-up visits to maximize her success with intensive lifestyle modifications for her multiple health  conditions.   Objective:   Blood pressure (!) 141/87, pulse 87, temperature 98 F (36.7 C), height 5' 6.5" (1.689 m), weight 240 lb (108.9 kg), SpO2 96%. Body mass index is 38.16 kg/m.  Lab Results  Component Value Date   CREATININE 0.71 12/04/2022   BUN 10 12/04/2022   NA 142 12/04/2022   K 4.3 12/04/2022   CL 103 12/04/2022   CO2 26 12/04/2022   Lab Results  Component Value Date   ALT 17 12/04/2022   AST 15 12/04/2022   ALKPHOS 81 12/04/2022   BILITOT 0.4 12/04/2022   Lab Results  Component Value Date   HGBA1C 6.1 (H) 12/04/2022   HGBA1C 5.9 (H) 02/20/2022   HGBA1C 6.3 (H) 09/18/2021   HGBA1C 6.2 (H) 12/05/2020   HGBA1C 6.2 (H) 04/04/2020   Lab Results  Component Value Date   INSULIN 7.7 12/04/2022   INSULIN 11.7 02/20/2022   INSULIN 18.8 09/18/2021   INSULIN 13.5 12/05/2020   INSULIN 22.9 04/04/2020   Lab Results  Component Value Date   TSH 2.050 12/04/2022   Lab Results  Component Value Date   CHOL 164 12/04/2022   HDL 49 12/04/2022   LDLCALC 82 12/04/2022   TRIG 193 (H) 12/04/2022   CHOLHDL 3.9 04/04/2020   Lab Results  Component Value Date   VD25OH 51.9 12/04/2022   VD25OH 62.2 02/20/2022   VD25OH 71.0 09/18/2021   Lab Results  Component Value Date   WBC 6.2 12/04/2022   HGB 13.6 12/04/2022   HCT  40.7 12/04/2022   MCV 86 12/04/2022   PLT 280 12/04/2022   Lab Results  Component Value Date   IRON 82 12/04/2022   TIBC 343 12/04/2022   FERRITIN 65 12/04/2022   Attestation Statements:   Reviewed by clinician on day of visit: allergies, medications, problem list, medical history, surgical history, family history, social history, and previous encounter notes.  Time spent on visit including pre-visit chart review and post-visit care and charting was 40 minutes.   I, Burt Knack, am acting as transcriptionist for Quillian Quince, MD.  I have reviewed the above documentation for accuracy and completeness, and I agree with the above. -   ***

## 2023-03-17 ENCOUNTER — Other Ambulatory Visit: Payer: Self-pay | Admitting: Internal Medicine

## 2023-03-19 ENCOUNTER — Other Ambulatory Visit (HOSPITAL_COMMUNITY): Payer: Self-pay

## 2023-03-19 ENCOUNTER — Encounter (INDEPENDENT_AMBULATORY_CARE_PROVIDER_SITE_OTHER): Payer: Self-pay | Admitting: Family Medicine

## 2023-03-20 ENCOUNTER — Other Ambulatory Visit (HOSPITAL_COMMUNITY): Payer: Self-pay

## 2023-04-01 ENCOUNTER — Other Ambulatory Visit: Payer: Self-pay | Admitting: Internal Medicine

## 2023-04-01 ENCOUNTER — Other Ambulatory Visit (HOSPITAL_COMMUNITY): Payer: Self-pay

## 2023-04-03 ENCOUNTER — Ambulatory Visit (INDEPENDENT_AMBULATORY_CARE_PROVIDER_SITE_OTHER): Payer: 59 | Admitting: Family Medicine

## 2023-04-03 ENCOUNTER — Other Ambulatory Visit (HOSPITAL_COMMUNITY): Payer: Self-pay

## 2023-04-03 ENCOUNTER — Encounter (INDEPENDENT_AMBULATORY_CARE_PROVIDER_SITE_OTHER): Payer: Self-pay | Admitting: Family Medicine

## 2023-04-03 VITALS — BP 126/75 | HR 98 | Temp 98.1°F | Ht 66.5 in | Wt 237.0 lb

## 2023-04-03 DIAGNOSIS — Z6837 Body mass index (BMI) 37.0-37.9, adult: Secondary | ICD-10-CM | POA: Diagnosis not present

## 2023-04-03 DIAGNOSIS — R7303 Prediabetes: Secondary | ICD-10-CM | POA: Diagnosis not present

## 2023-04-03 DIAGNOSIS — E559 Vitamin D deficiency, unspecified: Secondary | ICD-10-CM

## 2023-04-03 DIAGNOSIS — F418 Other specified anxiety disorders: Secondary | ICD-10-CM | POA: Diagnosis not present

## 2023-04-03 DIAGNOSIS — E669 Obesity, unspecified: Secondary | ICD-10-CM | POA: Diagnosis not present

## 2023-04-03 MED ORDER — METFORMIN HCL 500 MG PO TABS
500.0000 mg | ORAL_TABLET | Freq: Two times a day (BID) | ORAL | 0 refills | Status: DC
  Filled 2023-04-03 – 2023-05-06 (×2): qty 60, 30d supply, fill #0

## 2023-04-03 MED ORDER — VITAMIN D-3 125 MCG (5000 UT) PO TABS
5000.0000 [IU] | ORAL_TABLET | Freq: Every day | ORAL | Status: AC
Start: 2023-04-03 — End: ?

## 2023-04-03 NOTE — Progress Notes (Signed)
Chief Complaint:   OBESITY Candace [name] is here to discuss her progress with her obesity treatment plan along with follow-up of her obesity related diagnoses. Candace [name] is on the Category 3 Plan and keeping a food journal and adhering to recommended goals of 1500 calories and 85+ grams of protein and states she is following her eating plan approximately 50% of the time. Candace [name] states she is walking for 20 minutes 4 times per week.  Today's visit was #: 90 Starting weight: 249 lbs Starting date: 09/17/2017 Today's weight: 237 lbs Today's date: 04/03/2023 Total lbs lost to date: 12 Total lbs lost since last in-office visit: 3  Interim History: Patient has done well with her weight loss, but she has had extra stressors.  She is still working on being mindful and exercising.  Subjective:   1. Vitamin D deficiency Patient is on vitamin D, and her last vitamin D level was at goal.  2. Pre-diabetes Patient continues to work on her diet and weight loss.  3. Depression with anxiety Patient feels her mood is lower and anxiety has increased.  She did well with Prozac in the past, but it worsened her RLS.  She notes increased temptations and she indulged more.  Assessment/Plan:   1. Vitamin D deficiency Patient will continue vitamin D 5,000 IU daily, and we will refill for 1 month.  2. Pre-diabetes Patient will continue metformin 500 mg twice daily, and we will refill for 1 month.  Patient is okay to decrease her second dose.  3. Depression with anxiety Patient will discuss this with Dr. Cliffton Asters.  We will work around what ever they decide to do, and will keep an eye on her emotional eating behavior.  4. BMI 37.0-37.9, adult  5. Obesity, Beginning BMI 39.0 Candace [name] is currently in the action stage of change. As such, her goal is to continue with weight loss efforts. She has agreed to the Category 3 Plan and keeping a food journal and adhering to recommended goals of 1500 calories and 85+ grams of  protein daily.   Exercise goals: As is.   Behavioral modification strategies: increasing lean protein intake.  Mavery has agreed to follow-up with our clinic in 4 weeks. She was informed of the importance of frequent follow-up visits to maximize her success with intensive lifestyle modifications for her multiple health conditions.   Objective:   Blood pressure 126/75, pulse 98, temperature 98.1 F (36.7 C), height 5' 6.5" (1.689 m), weight 237 lb (107.5 kg), SpO2 96%. Body mass index is 37.68 kg/m.  Lab Results  Component Value Date   CREATININE 0.71 12/04/2022   BUN 10 12/04/2022   NA 142 12/04/2022   K 4.3 12/04/2022   CL 103 12/04/2022   CO2 26 12/04/2022   Lab Results  Component Value Date   ALT 17 12/04/2022   AST 15 12/04/2022   ALKPHOS 81 12/04/2022   BILITOT 0.4 12/04/2022   Lab Results  Component Value Date   HGBA1C 6.1 (H) 12/04/2022   HGBA1C 5.9 (H) 02/20/2022   HGBA1C 6.3 (H) 09/18/2021   HGBA1C 6.2 (H) 12/05/2020   HGBA1C 6.2 (H) 04/04/2020   Lab Results  Component Value Date   INSULIN 7.7 12/04/2022   INSULIN 11.7 02/20/2022   INSULIN 18.8 09/18/2021   INSULIN 13.5 12/05/2020   INSULIN 22.9 04/04/2020   Lab Results  Component Value Date   TSH 2.050 12/04/2022   Lab Results  Component Value Date   CHOL 164 12/04/2022  HDL 49 12/04/2022   LDLCALC 82 12/04/2022   TRIG 193 (H) 12/04/2022   CHOLHDL 3.9 04/04/2020   Lab Results  Component Value Date   VD25OH 51.9 12/04/2022   VD25OH 62.2 02/20/2022   VD25OH 71.0 09/18/2021   Lab Results  Component Value Date   WBC 6.2 12/04/2022   HGB 13.6 12/04/2022   HCT 40.7 12/04/2022   MCV 86 12/04/2022   PLT 280 12/04/2022   Lab Results  Component Value Date   IRON 82 12/04/2022   TIBC 343 12/04/2022   FERRITIN 65 12/04/2022   Attestation Statements:   Reviewed by clinician on day of visit: allergies, medications, problem list, medical history, surgical history, family history, social  history, and previous encounter notes.   I, Burt Knack, am acting as transcriptionist for Quillian Quince, MD.  I have reviewed the above documentation for accuracy and completeness, and I agree with the above. -  Quillian Quince, MD

## 2023-04-08 ENCOUNTER — Other Ambulatory Visit: Payer: Self-pay

## 2023-04-08 ENCOUNTER — Other Ambulatory Visit (HOSPITAL_COMMUNITY): Payer: Self-pay

## 2023-04-08 DIAGNOSIS — R7303 Prediabetes: Secondary | ICD-10-CM | POA: Diagnosis not present

## 2023-04-08 DIAGNOSIS — F419 Anxiety disorder, unspecified: Secondary | ICD-10-CM | POA: Diagnosis not present

## 2023-04-08 DIAGNOSIS — Z0000 Encounter for general adult medical examination without abnormal findings: Secondary | ICD-10-CM | POA: Diagnosis not present

## 2023-04-08 DIAGNOSIS — K219 Gastro-esophageal reflux disease without esophagitis: Secondary | ICD-10-CM | POA: Diagnosis not present

## 2023-04-08 DIAGNOSIS — F3341 Major depressive disorder, recurrent, in partial remission: Secondary | ICD-10-CM | POA: Diagnosis not present

## 2023-04-08 DIAGNOSIS — G2581 Restless legs syndrome: Secondary | ICD-10-CM | POA: Diagnosis not present

## 2023-04-08 DIAGNOSIS — E785 Hyperlipidemia, unspecified: Secondary | ICD-10-CM | POA: Diagnosis not present

## 2023-04-08 DIAGNOSIS — G4733 Obstructive sleep apnea (adult) (pediatric): Secondary | ICD-10-CM | POA: Diagnosis not present

## 2023-04-08 DIAGNOSIS — I10 Essential (primary) hypertension: Secondary | ICD-10-CM | POA: Diagnosis not present

## 2023-04-08 DIAGNOSIS — Z23 Encounter for immunization: Secondary | ICD-10-CM | POA: Diagnosis not present

## 2023-04-08 MED ORDER — FERROUS SULFATE 325 (65 FE) MG PO TABS
325.0000 mg | ORAL_TABLET | Freq: Every day | ORAL | 3 refills | Status: AC
Start: 2023-04-08 — End: ?
  Filled 2023-04-08: qty 100, 100d supply, fill #0

## 2023-04-08 MED ORDER — ROPINIROLE HCL 1 MG PO TABS
1.0000 mg | ORAL_TABLET | Freq: Every evening | ORAL | 2 refills | Status: DC
  Filled 2023-04-08: qty 90, 90d supply, fill #0
  Filled 2023-07-16: qty 90, 90d supply, fill #1
  Filled 2023-09-30: qty 90, 90d supply, fill #2

## 2023-04-08 MED ORDER — TRIAMTERENE-HCTZ 37.5-25 MG PO TABS
1.0000 | ORAL_TABLET | Freq: Every day | ORAL | 1 refills | Status: DC
  Filled 2023-04-08: qty 90, 90d supply, fill #0
  Filled 2023-08-01: qty 90, 90d supply, fill #1

## 2023-04-08 MED ORDER — ESCITALOPRAM OXALATE 5 MG PO TABS
5.0000 mg | ORAL_TABLET | Freq: Every day | ORAL | 1 refills | Status: DC
  Filled 2023-04-08: qty 90, 90d supply, fill #0
  Filled 2023-09-25: qty 90, 90d supply, fill #1

## 2023-04-08 MED ORDER — BUPROPION HCL ER (XL) 300 MG PO TB24
300.0000 mg | ORAL_TABLET | Freq: Every morning | ORAL | 1 refills | Status: DC
  Filled 2023-04-08 – 2023-07-11 (×2): qty 90, 90d supply, fill #0
  Filled 2023-11-06: qty 90, 90d supply, fill #1

## 2023-04-08 MED ORDER — METFORMIN HCL 500 MG PO TABS: ORAL_TABLET | ORAL | 1 refills | Status: DC

## 2023-04-08 MED ORDER — LOSARTAN POTASSIUM 50 MG PO TABS
50.0000 mg | ORAL_TABLET | Freq: Every day | ORAL | 1 refills | Status: DC
  Filled 2023-04-08: qty 90, 90d supply, fill #0
  Filled 2023-07-30: qty 90, 90d supply, fill #1

## 2023-04-08 MED ORDER — ROSUVASTATIN CALCIUM 10 MG PO TABS: 10.0000 mg | ORAL_TABLET | Freq: Every evening | ORAL | 3 refills | Status: DC

## 2023-04-09 ENCOUNTER — Other Ambulatory Visit (HOSPITAL_COMMUNITY): Payer: Self-pay

## 2023-05-06 ENCOUNTER — Other Ambulatory Visit: Payer: Self-pay

## 2023-05-06 ENCOUNTER — Other Ambulatory Visit (HOSPITAL_COMMUNITY): Payer: Self-pay

## 2023-05-08 ENCOUNTER — Encounter (INDEPENDENT_AMBULATORY_CARE_PROVIDER_SITE_OTHER): Payer: Self-pay | Admitting: Family Medicine

## 2023-05-08 ENCOUNTER — Ambulatory Visit (INDEPENDENT_AMBULATORY_CARE_PROVIDER_SITE_OTHER): Payer: 59 | Admitting: Family Medicine

## 2023-05-08 VITALS — BP 138/70 | HR 82 | Temp 98.2°F | Ht 66.5 in | Wt 239.0 lb

## 2023-05-08 DIAGNOSIS — E1169 Type 2 diabetes mellitus with other specified complication: Secondary | ICD-10-CM

## 2023-05-08 DIAGNOSIS — Z7984 Long term (current) use of oral hypoglycemic drugs: Secondary | ICD-10-CM

## 2023-05-08 DIAGNOSIS — J00 Acute nasopharyngitis [common cold]: Secondary | ICD-10-CM

## 2023-05-08 DIAGNOSIS — R058 Other specified cough: Secondary | ICD-10-CM | POA: Insufficient documentation

## 2023-05-08 DIAGNOSIS — Z6838 Body mass index (BMI) 38.0-38.9, adult: Secondary | ICD-10-CM | POA: Diagnosis not present

## 2023-05-08 DIAGNOSIS — E669 Obesity, unspecified: Secondary | ICD-10-CM

## 2023-05-08 NOTE — Progress Notes (Signed)
[facility]  Office: [phone]  /  Fax: [phone]  WEIGHT SUMMARY AND BIOMETRICS  Anthropometric Measurements Height: 5' 6.5" (1.689 m) Weight: 239 lb (108.4 kg) BMI (Calculated): 38 Weight at Last Visit: 237 lb Weight Lost Since Last Visit: 0 Weight Gained Since Last Visit: 2 lb Starting Weight: 249 lb Total Weight Loss (lbs): 10 lb (4.536 kg) Peak Weight: 252 lb   Body Composition  Body Fat %: 46.8 % Fat Mass (lbs): 112 lbs Muscle Mass (lbs): 120.8 lbs Total Body Water (lbs): 88.6 lbs Visceral Fat Rating : 14   Other Clinical Data Fasting: No Labs: No Today's Visit #: 91 Starting Date: 09/17/17    Chief Complaint: OBESITY   .  History of Present Illness   The patient, with a history of type 2 diabetes and obesity, presents with a persistent cough following a recent cold. The cough, described as 'tickly', is located in the upper chest and occasionally productive. The patient reports a sensation of postnasal drip and a feeling of 'something there' in the middle of the night. The cough is not associated with fever or significant dyspnea, and the patient denies any lower respiratory symptoms.  The patient also reports struggling with dietary adherence, following her category three diet only 50% of the time. Despite regular exercise, walking four times a week for about 20 minutes, she has gained two pounds in the last month.  The patient recently started on Lexapro for feelings of tearfulness, which has since resolved. She reports feeling 'middle of the road' emotionally, attributing this to the medication.  The patient's diabetes is managed with metformin, which has been causing gastrointestinal issues. Despite reducing the dose, the patient continues to experience symptoms, particularly in the morning.  The patient is planning for the upcoming holiday season, aiming to limit high-calorie food intake to reduce the amount of leftovers and potential weight gain. She is also  planning a short cruise trip.          PHYSICAL EXAM:  Blood pressure 138/70, pulse 82, temperature 98.2 F (36.8 C), height 5' 6.5" (1.689 m), weight 239 lb (108.4 kg), SpO2 97%. Body mass index is 38 kg/m.  DIAGNOSTIC DATA REVIEWED:  BMET    Component Value Date/Time   NA 142 12/04/2022 1158   NA 141 04/16/2017 1409   K 4.3 12/04/2022 1158   K 3.8 04/16/2017 1409   CL 103 12/04/2022 1158   CO2 26 12/04/2022 1158   CO2 25 04/16/2017 1409   GLUCOSE 91 12/04/2022 1158   GLUCOSE 109 (H) 09/19/2021 1319   GLUCOSE 134 04/16/2017 1409   BUN 10 12/04/2022 1158   BUN 12.4 04/16/2017 1409   CREATININE 0.71 12/04/2022 1158   CREATININE 0.87 04/15/2019 0941   CREATININE 0.9 04/16/2017 1409   CALCIUM 9.7 12/04/2022 1158   CALCIUM 9.6 04/16/2017 1409   GFRNONAA >60 09/19/2021 1319   GFRNONAA >60 04/15/2019 0941   GFRAA 102 04/04/2020 0810   GFRAA >60 04/15/2019 0941   Lab Results  Component Value Date   HGBA1C 6.1 (H) 12/04/2022   HGBA1C 6.2 (H) 09/17/2017   Lab Results  Component Value Date   INSULIN 7.7 12/04/2022   INSULIN 24.2 09/17/2017   Lab Results  Component Value Date   TSH 2.050 12/04/2022   CBC    Component Value Date/Time   WBC 6.2 12/04/2022 1158   WBC 7.1 04/15/2019 0941   WBC 6.8 11/11/2017 1127   RBC 4.75 12/04/2022 1158   RBC 4.94 04/15/2019 0941  HGB 13.6 12/04/2022 1158   HGB 13.7 04/16/2017 1409   HCT 40.7 12/04/2022 1158   HCT 41.2 04/16/2017 1409   PLT 280 12/04/2022 1158   MCV 86 12/04/2022 1158   MCV 84.4 04/16/2017 1409   MCH 28.6 12/04/2022 1158   MCH 27.1 04/15/2019 0941   MCHC 33.4 12/04/2022 1158   MCHC 32.4 04/15/2019 0941   RDW 12.2 12/04/2022 1158   RDW 13.2 04/16/2017 1409   Iron Studies    Component Value Date/Time   IRON 82 12/04/2022 1158   TIBC 343 12/04/2022 1158   FERRITIN 65 12/04/2022 1158   IRONPCTSAT 24 12/04/2022 1158   Lipid Panel     Component Value Date/Time   CHOL 164 12/04/2022 1158   TRIG 193  (H) 12/04/2022 1158   HDL 49 12/04/2022 1158   CHOLHDL 3.9 04/04/2020 0810   LDLCALC 82 12/04/2022 1158   Hepatic Function Panel     Component Value Date/Time   PROT 6.5 12/04/2022 1158   PROT 7.0 04/16/2017 1409   ALBUMIN 4.4 12/04/2022 1158   ALBUMIN 4.0 04/16/2017 1409   AST 15 12/04/2022 1158   AST 17 04/15/2019 0941   AST 29 04/16/2017 1409   ALT 17 12/04/2022 1158   ALT 19 04/15/2019 0941   ALT 34 04/16/2017 1409   ALKPHOS 81 12/04/2022 1158   ALKPHOS 74 04/16/2017 1409   BILITOT 0.4 12/04/2022 1158   BILITOT 0.3 04/15/2019 0941   BILITOT 0.40 04/16/2017 1409      Component Value Date/Time   TSH 2.050 12/04/2022 1158   Nutritional Lab Results  Component Value Date   VD25OH 51.9 12/04/2022   VD25OH 62.2 02/20/2022   VD25OH 71.0 09/18/2021     Assessment and Plan    Type 2 Diabetes Mellitus Patient is on metformin but experiencing GI side effects. She has gained 2 pounds since last visit and reports only adhering to her diet 50% of the time. -Continue metformin at a reduced dose due to GI side effects. -Encourage adherence to diet and exercise regimen.  Obesity Patient has gained weight since last visit and is struggling with dietary adherence. -Encourage continued exercise and adherence to diet. -holiday eating strategies discussed  Postnasal Drip Patient reports a persistent cough following a recent cold, likely due to postnasal drip. -Recommend over-the-counter Delsym (dextromethorphan) to help reduce coughing. -Advise patient to seek medical attention if symptoms worsen or she develops a fever.  General Health Maintenance / Followup Plans -Schedule follow-up appointments for December and January. -Continue monitoring weight and diabetes management.         She was informed of the importance of frequent follow up visits to maximize her success with intensive lifestyle modifications for her multiple health conditions.    Quillian Quince, MD

## 2023-06-10 ENCOUNTER — Other Ambulatory Visit (HOSPITAL_COMMUNITY): Payer: Self-pay

## 2023-06-10 ENCOUNTER — Encounter (INDEPENDENT_AMBULATORY_CARE_PROVIDER_SITE_OTHER): Payer: Self-pay | Admitting: Adult Health

## 2023-06-10 ENCOUNTER — Ambulatory Visit (INDEPENDENT_AMBULATORY_CARE_PROVIDER_SITE_OTHER): Payer: 59 | Admitting: Adult Health

## 2023-06-10 VITALS — BP 138/75 | HR 86 | Temp 98.1°F | Ht 66.5 in | Wt 239.0 lb

## 2023-06-10 DIAGNOSIS — E559 Vitamin D deficiency, unspecified: Secondary | ICD-10-CM

## 2023-06-10 DIAGNOSIS — Z6838 Body mass index (BMI) 38.0-38.9, adult: Secondary | ICD-10-CM

## 2023-06-10 DIAGNOSIS — E669 Obesity, unspecified: Secondary | ICD-10-CM | POA: Diagnosis not present

## 2023-06-10 DIAGNOSIS — R7303 Prediabetes: Secondary | ICD-10-CM | POA: Diagnosis not present

## 2023-06-10 DIAGNOSIS — F418 Other specified anxiety disorders: Secondary | ICD-10-CM | POA: Diagnosis not present

## 2023-06-10 MED ORDER — METFORMIN HCL ER 500 MG PO TB24
500.0000 mg | ORAL_TABLET | Freq: Every day | ORAL | 0 refills | Status: DC
  Filled 2023-06-10: qty 30, 30d supply, fill #0

## 2023-06-10 NOTE — Progress Notes (Signed)
WEIGHT SUMMARY AND BIOMETRICS  Vitals Temp: 98.1 F (36.7 C) BP: 138/75 Pulse Rate: 86 SpO2: 97 %   Anthropometric Measurements Height: 5' 6.5" (1.689 m) Weight: 239 lb (108.4 kg) BMI (Calculated): 38 Weight at Last Visit: 239lb Weight Lost Since Last Visit: 0 Weight Gained Since Last Visit: 0 Starting Weight: 249lb Total Weight Loss (lbs): 10 lb (4.536 kg) Peak Weight: 252lb   Body Composition  Body Fat %: 46.9 % Fat Mass (lbs): 112 lbs Muscle Mass (lbs): 120.6 lbs Total Body Water (lbs): 90.2 lbs Visceral Fat Rating : 14   Other Clinical Data Fasting: no Labs: no Today's Visit #: 18 Starting Date: 09/17/17    Chief Complaint:   OBESITY Candace [name] is here to discuss her progress with her obesity treatment plan. She is on the the Category 3 Plan and states she is following her eating plan approximately 60 % of the time.  She states she is exercising Walking 20-45 minutes 3 times per week.   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 She is currently on daily Metformin 500mg - experiencing GI upset (daily loose stools).  She has two college ages daughters, the three of them set sail for 4 day [place] cruise at weeks end- discussed Travel Strategies.  Subjective:   1. Prediabetes Lab Results  Component Value Date   HGBA1C 6.1 (H) 12/04/2022   HGBA1C 5.9 (H) 02/20/2022   HGBA1C 6.3 (H) 09/18/2021    She is currently on daily Metformin 500mg  (plain formulation) She reports persistent, profuse loose stools each morning. She was previously on Wegovy- unable to find Rx supply due to [employer] She was previously on [employer]- unable to tolerate due to nausea without vomiting and profuse diarrhea   2. Vitamin D deficiency  Latest Reference Range & Units 09/08/19 13:18 04/04/20 08:10  Vitamin D, 25-Hydroxy  30.0 - 100.0 ng/mL 42.5 41.2   She is on daily OTC Vit D3 5,000 international units She reports stable energy levels  3. Depression with anxiety She reports worsening anxiety and "tearfullness" that began over summer 2024 04/08/2023- Started on Lexapro 5mg  (replaced Prozac) Continued on daily Wellbutrin XL 300mg   She endorses feeling less anxious and more "emotionally stable"  Assessment/Plan:   1. PreDiabetes Stop Metformin 500mg  Start  metFORMIN (GLUCOPHAGE-XR) 500 MG 24 hr tablet Take 1 tablet (500 mg total) by mouth daily with a full meal Dispense: 30 tablet, Refills: 0 of 0 remaining   2. Vitamin D deficiency Continue daily OTC Vit D3 5,000 international units  3. Depression with anxiety Continue Lexapro 5mg  and Wellbutrin XL 300mg   4. BMI 38.0-38.9,adult, Current BMI 38.0  Candace [name] is currently in the action stage of change. As such, her goal is to continue with weight loss efforts. She has agreed to the Category 3 Plan.   Exercise goals: All adults should avoid inactivity. Some physical activity is better than none, and adults who participate in any amount of physical activity gain some health benefits. Adults should also include muscle-strengthening activities that involve all major muscle groups on 2 or more days a week.  Behavioral modification strategies: increasing lean protein intake, decreasing simple carbohydrates, increasing vegetables, increasing water intake, no skipping meals, meal planning and cooking strategies, keeping healthy foods in the home, ways to avoid boredom eating, travel eating strategies, holiday eating strategies , and  planning for success.  Candace [name] has agreed to follow-up with our clinic in 4 weeks. She was informed of the importance of frequent follow-up visits to maximize her success with intensive lifestyle modifications for her multiple health conditions.   Check Fasting Labs at next OV  Objective:   Blood pressure 138/75, pulse 86,  temperature 98.1 F (36.7 C), height 5' 6.5" (1.689 m), weight 239 lb (108.4 kg), SpO2 97%. Body mass index is 38 kg/m.  General: Cooperative, alert, well developed, in no acute distress. HEENT: Conjunctivae and lids unremarkable. Cardiovascular: Regular rhythm.  Lungs: Normal work of breathing. Neurologic: No focal deficits.   Lab Results  Component Value Date   CREATININE 0.71 12/04/2022   BUN 10 12/04/2022   NA 142 12/04/2022   K 4.3 12/04/2022   CL 103 12/04/2022   CO2 26 12/04/2022   Lab Results  Component Value Date   ALT 17 12/04/2022   AST 15 12/04/2022   ALKPHOS 81 12/04/2022   BILITOT 0.4 12/04/2022   Lab Results  Component Value Date   HGBA1C 6.1 (H) 12/04/2022   HGBA1C 5.9 (H) 02/20/2022   HGBA1C 6.3 (H) 09/18/2021   HGBA1C 6.2 (H) 12/05/2020   HGBA1C 6.2 (H) 04/04/2020   Lab Results  Component Value Date   INSULIN 7.7 12/04/2022   INSULIN 11.7 02/20/2022   INSULIN 18.8 09/18/2021   INSULIN 13.5 12/05/2020   INSULIN 22.9 04/04/2020   Lab Results  Component Value Date   TSH 2.050 12/04/2022   Lab Results  Component Value Date   CHOL 164 12/04/2022   HDL 49 12/04/2022   LDLCALC 82 12/04/2022   TRIG 193 (H) 12/04/2022   CHOLHDL 3.9 04/04/2020   Lab Results  Component Value Date   VD25OH 51.9 12/04/2022   VD25OH 62.2 02/20/2022   VD25OH 71.0 09/18/2021   Lab Results  Component Value Date   WBC 6.2 12/04/2022   HGB 13.6 12/04/2022   HCT 40.7 12/04/2022   MCV 86 12/04/2022   PLT 280 12/04/2022   Lab Results  Component Value Date   IRON 82 12/04/2022   TIBC 343 12/04/2022   FERRITIN 65 12/04/2022   Attestation Statements:   Reviewed by clinician on day of visit: allergies, medications, problem list, medical history, surgical history, family history, social history, and previous encounter notes.  I have reviewed the above documentation for accuracy and completeness, and I agree with the above. -  Lacinda Curvin d. Kiante Ciavarella, NP-C

## 2023-07-08 ENCOUNTER — Encounter (INDEPENDENT_AMBULATORY_CARE_PROVIDER_SITE_OTHER): Payer: Self-pay | Admitting: Family Medicine

## 2023-07-08 ENCOUNTER — Other Ambulatory Visit (HOSPITAL_COMMUNITY): Payer: Self-pay

## 2023-07-08 ENCOUNTER — Ambulatory Visit (INDEPENDENT_AMBULATORY_CARE_PROVIDER_SITE_OTHER): Payer: 59 | Admitting: Family Medicine

## 2023-07-08 VITALS — BP 150/76 | HR 93 | Temp 98.2°F | Ht 66.5 in | Wt 244.0 lb

## 2023-07-08 DIAGNOSIS — E669 Obesity, unspecified: Secondary | ICD-10-CM

## 2023-07-08 DIAGNOSIS — R7303 Prediabetes: Secondary | ICD-10-CM | POA: Diagnosis not present

## 2023-07-08 DIAGNOSIS — Z6838 Body mass index (BMI) 38.0-38.9, adult: Secondary | ICD-10-CM

## 2023-07-08 DIAGNOSIS — I10 Essential (primary) hypertension: Secondary | ICD-10-CM

## 2023-07-08 DIAGNOSIS — G2581 Restless legs syndrome: Secondary | ICD-10-CM | POA: Diagnosis not present

## 2023-07-08 MED ORDER — METFORMIN HCL ER 500 MG PO TB24
500.0000 mg | ORAL_TABLET | Freq: Every day | ORAL | 0 refills | Status: DC
  Filled 2023-07-08: qty 30, 30d supply, fill #0

## 2023-07-08 NOTE — Progress Notes (Signed)
 .smr  Office: [phone]  /  Fax: [phone]  WEIGHT SUMMARY AND BIOMETRICS  Anthropometric Measurements Height: 5' 6.5 (1.689 m) Weight: 244 lb (110.7 kg) BMI (Calculated): 38.8 Weight at Last Visit: 239 lb Weight Lost Since Last Visit: 0 Weight Gained Since Last Visit: 5 lb Starting Weight: 249 lb Total Weight Loss (lbs): 5 lb (2.268 kg) Peak Weight: 252 lb   Body Composition  Body Fat %: 47.9 % Fat Mass (lbs): 117.2 lbs Muscle Mass (lbs): 121.2 lbs Total Body Water (lbs): 91.4 lbs Visceral Fat Rating : 15   Other Clinical Data Fasting: No Today's Visit #: 77 Starting Date: 09/17/17    Chief Complaint: OBESITY   History of Present Illness   The patient, diagnosed with prediabetes and obesity, is currently on metformin . The most recent hemoglobin A1c was 6.1 in June. Over the past month, the patient has gained five pounds, which she attributes to the holiday season. She reports adherence to her category three eating plan about 50% of the time and engages in walking for exercise twice a week for 20 minutes each time.  The patient recently went on a four-night cruise during which she maintained portion control and made healthier food choices, such as opting for sugar-free desserts and avoiding large sundaes. Despite this, she admits to consuming a lot of red meat during the trip. She also reports increased physical activity during the trip, including a lot of walking.  The patient has been experiencing increased stress at work due to high patient volumes and critical cases. She reports feeling irritable and annoyed, particularly with coworkers. She also notes that her restless leg syndrome worsened with the introduction of a new antidepressant (Lexapro ), leading her to reduce the dosage. The patient reports feeling better and less twitchy since reducing the dosage.  The patient admits to struggling with dietary choices at work, often consuming unhealthy snacks such as  donuts, cookies, and candy. She expresses a desire for a healthy lifestyle buddy to help her stay committed to healthier choices. She also reports that she is taking time-release metformin , which has improved her diarrhea symptoms, although it is still present.  The patient has a trip to [place] planned for April and expresses a desire to be in better health for the trip. She has started doing strengthening exercises for the lower body, with a focus on being able to get up off the floor if she falls. She also reports a recent incident where she accidentally double-dosed her nighttime medication, leading to an unusually deep sleep.          PHYSICAL EXAM:  Blood pressure (!) 150/76, pulse 93, temperature 98.2 F (36.8 C), height 5' 6.5 (1.689 m), weight 244 lb (110.7 kg), SpO2 95%. Body mass index is 38.79 kg/m.  DIAGNOSTIC DATA REVIEWED:  BMET    Component Value Date/Time   NA 142 12/04/2022 1158   NA 141 04/16/2017 1409   K 4.3 12/04/2022 1158   K 3.8 04/16/2017 1409   CL 103 12/04/2022 1158   CO2 26 12/04/2022 1158   CO2 25 04/16/2017 1409   GLUCOSE 91 12/04/2022 1158   GLUCOSE 109 (H) 09/19/2021 1319   GLUCOSE 134 04/16/2017 1409   BUN 10 12/04/2022 1158   BUN 12.4 04/16/2017 1409   CREATININE 0.71 12/04/2022 1158   CREATININE 0.87 04/15/2019 0941   CREATININE 0.9 04/16/2017 1409   CALCIUM  9.7 12/04/2022 1158   CALCIUM  9.6 04/16/2017 1409   GFRNONAA >60 09/19/2021 1319   GFRNONAA >  60 04/15/2019 0941   GFRAA 102 04/04/2020 0810   GFRAA >60 04/15/2019 0941   Lab Results  Component Value Date   HGBA1C 6.1 (H) 12/04/2022   HGBA1C 6.2 (H) 09/17/2017   Lab Results  Component Value Date   INSULIN  7.7 12/04/2022   INSULIN  24.2 09/17/2017   Lab Results  Component Value Date   TSH 2.050 12/04/2022   CBC    Component Value Date/Time   WBC 6.2 12/04/2022 1158   WBC 7.1 04/15/2019 0941   WBC 6.8 11/11/2017 1127   RBC 4.75 12/04/2022 1158   RBC 4.94 04/15/2019  0941   HGB 13.6 12/04/2022 1158   HGB 13.7 04/16/2017 1409   HCT 40.7 12/04/2022 1158   HCT 41.2 04/16/2017 1409   PLT 280 12/04/2022 1158   MCV 86 12/04/2022 1158   MCV 84.4 04/16/2017 1409   MCH 28.6 12/04/2022 1158   MCH 27.1 04/15/2019 0941   MCHC 33.4 12/04/2022 1158   MCHC 32.4 04/15/2019 0941   RDW 12.2 12/04/2022 1158   RDW 13.2 04/16/2017 1409   Iron Studies    Component Value Date/Time   IRON 82 12/04/2022 1158   TIBC 343 12/04/2022 1158   FERRITIN 65 12/04/2022 1158   IRONPCTSAT 24 12/04/2022 1158   Lipid Panel     Component Value Date/Time   CHOL 164 12/04/2022 1158   TRIG 193 (H) 12/04/2022 1158   HDL 49 12/04/2022 1158   CHOLHDL 3.9 04/04/2020 0810   LDLCALC 82 12/04/2022 1158   Hepatic Function Panel     Component Value Date/Time   PROT 6.5 12/04/2022 1158   PROT 7.0 04/16/2017 1409   ALBUMIN 4.4 12/04/2022 1158   ALBUMIN 4.0 04/16/2017 1409   AST 15 12/04/2022 1158   AST 17 04/15/2019 0941   AST 29 04/16/2017 1409   ALT 17 12/04/2022 1158   ALT 19 04/15/2019 0941   ALT 34 04/16/2017 1409   ALKPHOS 81 12/04/2022 1158   ALKPHOS 74 04/16/2017 1409   BILITOT 0.4 12/04/2022 1158   BILITOT 0.3 04/15/2019 0941   BILITOT 0.40 04/16/2017 1409      Component Value Date/Time   TSH 2.050 12/04/2022 1158   Nutritional Lab Results  Component Value Date   VD25OH 51.9 12/04/2022   VD25OH 62.2 02/20/2022   VD25OH 71.0 09/18/2021     Assessment and Plan    Prediabetes Prediabetes managed with metformin . Most recent hemoglobin A1c in June was 6.1%. Reports following dietary plan 50% of the time and has gained five pounds over the last month. Walking for exercise 20 minutes twice weekly. Switched to time-release metformin , which has improved diarrhea but still present, likely exacerbated by poor dietary choices at work. - Continue metformin  - Encourage adherence to dietary plan - Increase physical activity - Send in prescription for  metformin   Obesity Gained five pounds over the last month, likely due to holiday and stress-related eating at work. Struggling to maintain a healthy diet and exercise routine. Expressed desire for a healthy lifestyle buddy and difficulty finding one. Concerned about weight gain affecting upcoming trip to [place] in April. - Encourage finding a healthy lifestyle buddy - Recommend small, achievable exercise goals - Suggest keeping non-carbohydrate snacks at work - Consider bringing a veggie tray to work  Hypertension Blood pressure today is 150/76, possibly due to stress and not taking medications yet today. Reports no issues with current medications. Stress at work may be contributing to elevated blood pressure. - Check blood pressure  at home regularly - Ensure adherence to blood pressure medications  Restless Leg Syndrome Restless leg syndrome exacerbated by Lexapro . Reduced Lexapro  dosage to half, which has improved symptoms. Noted increased irritability when off Lexapro  completely. - Continue Lexapro  at half dose - Monitor symptoms and adjust as needed  General Health Maintenance Planning a trip to [place] in April and wants to improve health before then. Discussed importance of protein intake and monitoring weight and dietary habits. - Encourage protein intake - Monitor weight and dietary habits - Schedule next couple of appointments  Follow-up - Schedule follow-up appointment next month - Schedule additional appointments as needed.          She was informed of the importance of frequent follow up visits to maximize her success with intensive lifestyle modifications for her multiple health conditions.    Louann Penton, MD

## 2023-07-11 ENCOUNTER — Other Ambulatory Visit: Payer: Self-pay

## 2023-07-11 ENCOUNTER — Other Ambulatory Visit (HOSPITAL_COMMUNITY): Payer: Self-pay

## 2023-07-16 ENCOUNTER — Other Ambulatory Visit: Payer: Self-pay | Admitting: Family Medicine

## 2023-07-16 DIAGNOSIS — E2839 Other primary ovarian failure: Secondary | ICD-10-CM

## 2023-07-28 ENCOUNTER — Other Ambulatory Visit: Payer: Self-pay | Admitting: Family Medicine

## 2023-07-28 DIAGNOSIS — Z0000 Encounter for general adult medical examination without abnormal findings: Secondary | ICD-10-CM

## 2023-08-01 ENCOUNTER — Other Ambulatory Visit (HOSPITAL_COMMUNITY): Payer: Self-pay

## 2023-08-12 ENCOUNTER — Encounter: Payer: Self-pay | Admitting: Family Medicine

## 2023-08-19 ENCOUNTER — Ambulatory Visit (INDEPENDENT_AMBULATORY_CARE_PROVIDER_SITE_OTHER): Payer: 59 | Admitting: Family Medicine

## 2023-08-27 ENCOUNTER — Encounter (INDEPENDENT_AMBULATORY_CARE_PROVIDER_SITE_OTHER): Payer: Self-pay | Admitting: Adult Health

## 2023-08-27 ENCOUNTER — Ambulatory Visit (INDEPENDENT_AMBULATORY_CARE_PROVIDER_SITE_OTHER): Payer: Commercial Managed Care - PPO | Admitting: Adult Health

## 2023-08-27 ENCOUNTER — Other Ambulatory Visit (HOSPITAL_COMMUNITY): Payer: Self-pay

## 2023-08-27 VITALS — BP 133/87 | HR 73 | Temp 98.1°F | Ht 66.5 in | Wt 243.0 lb

## 2023-08-27 DIAGNOSIS — I10 Essential (primary) hypertension: Secondary | ICD-10-CM

## 2023-08-27 DIAGNOSIS — Z6838 Body mass index (BMI) 38.0-38.9, adult: Secondary | ICD-10-CM

## 2023-08-27 DIAGNOSIS — E559 Vitamin D deficiency, unspecified: Secondary | ICD-10-CM | POA: Diagnosis not present

## 2023-08-27 DIAGNOSIS — R7303 Prediabetes: Secondary | ICD-10-CM | POA: Diagnosis not present

## 2023-08-27 DIAGNOSIS — E669 Obesity, unspecified: Secondary | ICD-10-CM

## 2023-08-27 MED ORDER — METFORMIN HCL ER 500 MG PO TB24
500.0000 mg | ORAL_TABLET | Freq: Every day | ORAL | 0 refills | Status: DC
  Filled 2023-08-27: qty 30, 30d supply, fill #0

## 2023-08-27 NOTE — Progress Notes (Signed)
 WEIGHT SUMMARY AND BIOMETRICS  Vitals Temp: 98.1 F (36.7 C) BP: 133/87 Pulse Rate: 73 SpO2: 97 %   Anthropometric Measurements Height: 5' 6.5" (1.689 m) Weight: 243 lb (110.2 kg) BMI (Calculated): 38.64 Weight at Last Visit: 244lb Weight Lost Since Last Visit: 1lb Weight Gained Since Last Visit: 0 Starting Weight: 249lb Total Weight Loss (lbs): 6 lb (2.722 kg) Peak Weight: 252lb   Body Composition  Body Fat %: 47.6 % Fat Mass (lbs): 115.8 lbs Muscle Mass (lbs): 121.2 lbs Total Body Water (lbs): 91.4 lbs Visceral Fat Rating : 15   Other Clinical Data Fasting: no Labs: no Today's Visit #: 57 Starting Date: 09/17/17    Chief Complaint:   OBESITY Candace [name] is here to discuss her progress with her obesity treatment plan.  She is on the the Category 3 Plan/ WW 25 Points/day and states she is following her eating plan approximately 75 % of the time.  She states she is exercising: None   Interim History:  She restarted Weight Watchers [facility]) on 08/11/2023 She followed a hybrid plan of Cat 3 Meal and WW 25 points/day. 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 Pre-diabetes Lab Results  Component Value Date   HGBA1C 6.1 (H) 12/04/2022   HGBA1C 5.9 (H) 02/20/2022   HGBA1C 6.3 (H) 09/18/2021    06/10/2023 [facility] OV- Metforming 500mg  plain replaced with ER 500mg  She denies current GI upset  2. Essential hypertension BP slightly above goal at OV She is on  rosuvastatin (CRESTOR) 10 MG tablet  triamterene-hydrochlorothiazide (MAXZIDE-25) 37.5-25 MG tablet   3. Vitamin D deficiency  Latest Reference Range & Units 12/04/22 11:58  Vitamin D, 25-Hydroxy 30.0 - 100.0 ng/mL 51.9   She is currently on daily OTC  MVI and OTC Vit D 3 5,000 international units   Assessment/Plan:   1. Pre-diabetes (Primary) Refill  metFORMIN (GLUCOPHAGE-XR) 500 MG 24 hr tablet Take 1 tablet (500 mg total) by mouth daily with a full meal Dispense: 30 tablet, Refills: 0 ordered   2. Essential hypertension Limit Na+ Use Stand Up Desk at work Continue rosuvastatin (CRESTOR) 10 MG tablet  triamterene-hydrochlorothiazide (MAXZIDE-25) 37.5-25 MG tablet   3. Vitamin D deficiency Continue OTC Supplementation  4. BMI 38.0-38.9,adult, Current BMI 38.64  Candace [name] is not currently in the action stage of change. As such, her goal is to get back to weightloss efforts . She has agreed to the Category 3 Plan./ [employer] 25 Points/day  Exercise goals: All adults should avoid inactivity. Some physical activity is better than none, and adults who participate in any amount of physical activity gain some health benefits. Adults should also include muscle-strengthening activities that involve all major muscle groups on 2 or more days a week. Use Stand Up Desk at work Sat/Sun: Stand Up for 10 mins every hour Mon: Stand Up for 15 mins every hour  Behavioral modification strategies: increasing lean protein intake, decreasing simple carbohydrates, increasing vegetables, increasing water intake, decreasing eating out, no skipping meals, meal planning and cooking strategies, keeping healthy foods in the home, and planning for success.  Candace [name] has agreed to follow-up with our clinic in 4 weeks. She was informed of the importance of frequent follow-up visits to maximize her  success with intensive lifestyle modifications for her multiple health conditions.   Check Fasting Labs at next OV  Objective:   Blood pressure 133/87, pulse 73, temperature 98.1 F (36.7 C), height 5' 6.5" (1.689 m), weight 243 lb (110.2 kg), SpO2 97%. Body mass index is 38.63 kg/m.  General: Cooperative, alert, well developed, in no acute distress. HEENT: Conjunctivae  and lids unremarkable. Cardiovascular: Regular rhythm.  Lungs: Normal work of breathing. Neurologic: No focal deficits.   Lab Results  Component Value Date   CREATININE 0.71 12/04/2022   BUN 10 12/04/2022   NA 142 12/04/2022   K 4.3 12/04/2022   CL 103 12/04/2022   CO2 26 12/04/2022   Lab Results  Component Value Date   ALT 17 12/04/2022   AST 15 12/04/2022   ALKPHOS 81 12/04/2022   BILITOT 0.4 12/04/2022   Lab Results  Component Value Date   HGBA1C 6.1 (H) 12/04/2022   HGBA1C 5.9 (H) 02/20/2022   HGBA1C 6.3 (H) 09/18/2021   HGBA1C 6.2 (H) 12/05/2020   HGBA1C 6.2 (H) 04/04/2020   Lab Results  Component Value Date   INSULIN 7.7 12/04/2022   INSULIN 11.7 02/20/2022   INSULIN 18.8 09/18/2021   INSULIN 13.5 12/05/2020   INSULIN 22.9 04/04/2020   Lab Results  Component Value Date   TSH 2.050 12/04/2022   Lab Results  Component Value Date   CHOL 164 12/04/2022   HDL 49 12/04/2022   LDLCALC 82 12/04/2022   TRIG 193 (H) 12/04/2022   CHOLHDL 3.9 04/04/2020   Lab Results  Component Value Date   VD25OH 51.9 12/04/2022   VD25OH 62.2 02/20/2022   VD25OH 71.0 09/18/2021   Lab Results  Component Value Date   WBC 6.2 12/04/2022   HGB 13.6 12/04/2022   HCT 40.7 12/04/2022   MCV 86 12/04/2022   PLT 280 12/04/2022   Lab Results  Component Value Date   IRON 82 12/04/2022   TIBC 343 12/04/2022   FERRITIN 65 12/04/2022   Attestation Statements:   Reviewed by clinician on day of visit: allergies, medications, problem list, medical history, surgical history, family history, social history, and previous encounter notes.  I have reviewed the above documentation for accuracy and completeness, and I agree with the above. -  Adolphe Fortunato d. Desirie Minteer, NP-C

## 2023-09-09 ENCOUNTER — Other Ambulatory Visit (HOSPITAL_COMMUNITY): Payer: Self-pay

## 2023-09-11 ENCOUNTER — Encounter (HOSPITAL_COMMUNITY): Payer: Self-pay

## 2023-09-11 ENCOUNTER — Other Ambulatory Visit (HOSPITAL_COMMUNITY): Payer: Self-pay

## 2023-09-16 ENCOUNTER — Ambulatory Visit (INDEPENDENT_AMBULATORY_CARE_PROVIDER_SITE_OTHER): Payer: 59 | Admitting: Family Medicine

## 2023-09-22 ENCOUNTER — Inpatient Hospital Stay: Payer: Self-pay | Attending: Adult Health | Admitting: Adult Health

## 2023-09-23 ENCOUNTER — Other Ambulatory Visit (HOSPITAL_COMMUNITY): Payer: Self-pay

## 2023-09-23 ENCOUNTER — Encounter (INDEPENDENT_AMBULATORY_CARE_PROVIDER_SITE_OTHER): Payer: Self-pay | Admitting: Family Medicine

## 2023-09-23 ENCOUNTER — Ambulatory Visit (INDEPENDENT_AMBULATORY_CARE_PROVIDER_SITE_OTHER): Payer: 59 | Admitting: Family Medicine

## 2023-09-23 VITALS — BP 148/78 | HR 80 | Temp 97.8°F | Ht 66.5 in | Wt 244.0 lb

## 2023-09-23 DIAGNOSIS — E7849 Other hyperlipidemia: Secondary | ICD-10-CM

## 2023-09-23 DIAGNOSIS — E669 Obesity, unspecified: Secondary | ICD-10-CM

## 2023-09-23 DIAGNOSIS — R7303 Prediabetes: Secondary | ICD-10-CM | POA: Diagnosis not present

## 2023-09-23 DIAGNOSIS — E785 Hyperlipidemia, unspecified: Secondary | ICD-10-CM | POA: Diagnosis not present

## 2023-09-23 DIAGNOSIS — Z6838 Body mass index (BMI) 38.0-38.9, adult: Secondary | ICD-10-CM

## 2023-09-23 DIAGNOSIS — I10 Essential (primary) hypertension: Secondary | ICD-10-CM | POA: Diagnosis not present

## 2023-09-23 MED ORDER — METFORMIN HCL ER 500 MG PO TB24
500.0000 mg | ORAL_TABLET | Freq: Every day | ORAL | 0 refills | Status: DC
  Filled 2023-09-23: qty 90, 90d supply, fill #0

## 2023-09-23 MED ORDER — ROSUVASTATIN CALCIUM 10 MG PO TABS
10.0000 mg | ORAL_TABLET | Freq: Every evening | ORAL | 0 refills | Status: DC
  Filled 2023-09-23: qty 90, 90d supply, fill #0

## 2023-09-23 NOTE — Assessment & Plan Note (Signed)
 BP at home 130-140s/ 70-80s.  She mentions she always have elevated BP in this office.  On cozaar and maxzide.  No chest pain, chest pressure, headache.  CMP today  Will need to discuss increase in medication at next appointment if BP still elevated.

## 2023-09-23 NOTE — Progress Notes (Signed)
 SUBJECTIVE:  Chief Complaint: Obesity  Interim History: Patient is a former patient of mine from 2019.  Her last appointment was a month ago.  She has good response to the meal plan when she follows it.  She sits home frequently and works Saturday, Sunday, Monday.  She is trying to incorporate more movement frequently.  She follows meal plan for breakfast and lunch and tends to struggle with dinner and snacking.  She is not a cook but does like to bake.  She is trying to get in 100g of protein daily and is blending the Weight Watchers and Category 3 meal plan.  She is still skipping meals.   Candace [name] is here to discuss her progress with her obesity treatment plan. She is on the Category 3 Plan and states she is following her eating plan approximately 60 % of the time. She states she is not exercising.   OBJECTIVE: Visit Diagnoses: Problem List Items Addressed This Visit       Cardiovascular and Mediastinum   Hypertension   BP at home 130-140s/ 70-80s.  She mentions she always have elevated BP in this office.  On cozaar and maxzide.  No chest pain, chest pressure, headache.  CMP today  Will need to discuss increase in medication at next appointment if BP still elevated.       Relevant Medications   rosuvastatin (CRESTOR) 10 MG tablet     Other   Pre-diabetes - Primary   Doing well on metformin with no GI side effects.  Needs a refill of metformin today and will get CMP, A1c and Insulin level today.      Relevant Medications   metFORMIN (GLUCOPHAGE-XR) 500 MG 24 hr tablet   Other Relevant Orders   Comprehensive metabolic panel (Completed)   Hemoglobin A1c (Completed)   Insulin, random (Completed)   Hyperlipidemia   Patient doing well on crestor- no myalgias reported.  Needs a refill today.  Fasting lipid panel ordered today as well      Relevant Medications   rosuvastatin (CRESTOR) 10 MG tablet   Other Relevant Orders   Lipid Panel With LDL/HDL Ratio (Completed)   Obesity,  Beginnin BMI 39   Anthropometric Measurements Height: 5' 6.5" (1.689 m) Weight: 244 lb (110.7 kg) BMI (Calculated): 38.8 Weight at Last Visit: 243 lb Weight Lost Since Last Visit: 0 Weight Gained Since Last Visit: 1 lb Starting Weight: 249 lb Total Weight Loss (lbs): 5 lb (2.268 kg) Peak Weight: 252 lb Body Composition  Body Fat %: 47.7 % Fat Mass (lbs): 116.6 lbs Muscle Mass (lbs): 121.6 lbs Total Body Water (lbs): 90.8 lbs Visceral Fat Rating : 15 Other Clinical Data Fasting: yes Labs: no Today's Visit #: 95 Starting Date: 09/17/17       Relevant Medications   metFORMIN (GLUCOPHAGE-XR) 500 MG 24 hr tablet   BMI 38.0-38.9,adult    No data recorded         09/23/2023   10:08 AM 09/23/2023    9:59 AM 09/23/2023    9:00 AM  Vitals with BMI  Height   5' 6.5"  Weight   244 lbs  BMI   38.8  Systolic 148 150 161  Diastolic 78 87 84  Pulse   80     ASSESSMENT AND PLAN:  Diet: Bea is currently in the action stage of change. As such, her goal is to continue with weight loss efforts and has agreed to the Category 3 Plan.   Exercise:  For  substantial health benefits, adults should do at least 150 minutes (2 hours and 30 minutes) a week of moderate-intensity, or 75 minutes (1 hour and 15 minutes) a week of vigorous-intensity aerobic physical activity, or an equivalent combination of moderate- and vigorous-intensity aerobic activity. Aerobic activity should be performed in episodes of at least 10 minutes, and preferably, it should be spread throughout the week.  Behavior Modification:  We discussed the following Behavioral Modification Strategies today: increasing lean protein intake, no skipping meals, meal planning and cooking strategies, ways to avoid boredom eating, emotional eating strategies , and planning for success.   Return in about 5 weeks (around 10/28/2023) for repeat IC.[name] She was informed of the importance of frequent follow up visits to maximize her  success with intensive lifestyle modifications for her multiple health conditions.  Attestation Statements:   Reviewed by clinician on day of visit: allergies, medications, problem list, medical history, surgical history, family history, social history, and previous encounter notes.    Reuben Likes, MD

## 2023-09-25 ENCOUNTER — Other Ambulatory Visit (HOSPITAL_COMMUNITY): Payer: Self-pay

## 2023-09-25 LAB — COMPREHENSIVE METABOLIC PANEL WITH GFR
ALT: 30 IU/L (ref 0–32)
AST: 25 IU/L (ref 0–40)
Albumin: 4.5 g/dL (ref 3.9–4.9)
Alkaline Phosphatase: 94 IU/L (ref 44–121)
BUN/Creatinine Ratio: 15 (ref 12–28)
BUN: 11 mg/dL (ref 8–27)
Bilirubin Total: 0.5 mg/dL (ref 0.0–1.2)
CO2: 24 mmol/L (ref 20–29)
Calcium: 9.9 mg/dL (ref 8.7–10.3)
Chloride: 99 mmol/L (ref 96–106)
Creatinine, Ser: 0.75 mg/dL (ref 0.57–1.00)
Globulin, Total: 2.1 g/dL (ref 1.5–4.5)
Glucose: 101 mg/dL — ABNORMAL HIGH (ref 70–99)
Potassium: 3.8 mmol/L (ref 3.5–5.2)
Sodium: 138 mmol/L (ref 134–144)
Total Protein: 6.6 g/dL (ref 6.0–8.5)
eGFR: 90 mL/min/{1.73_m2} (ref >59)

## 2023-09-25 LAB — HEMOGLOBIN A1C
Est. average glucose Bld gHb Est-mCnc: 128 mg/dL
Hgb A1c MFr Bld: 6.1 % — ABNORMAL HIGH (ref 4.8–5.6)

## 2023-09-25 LAB — LIPID PANEL WITH LDL/HDL RATIO
Cholesterol, Total: 251 mg/dL — ABNORMAL HIGH (ref 100–199)
HDL: 44 mg/dL (ref >39)
LDL Chol Calc (NIH): 149 mg/dL — ABNORMAL HIGH (ref 0–99)
LDL/HDL Ratio: 3.4 ratio — ABNORMAL HIGH (ref 0.0–3.2)
Triglycerides: 314 mg/dL — ABNORMAL HIGH (ref 0–149)
VLDL Cholesterol Cal: 58 mg/dL — ABNORMAL HIGH (ref 5–40)

## 2023-09-25 LAB — INSULIN, RANDOM: INSULIN: 19.9 u[IU]/mL (ref 2.6–24.9)

## 2023-09-30 NOTE — Assessment & Plan Note (Signed)
 Patient doing well on crestor- no myalgias reported.  Needs a refill today.  Fasting lipid panel ordered today as well

## 2023-09-30 NOTE — Assessment & Plan Note (Signed)
 Anthropometric Measurements Height: 5' 6.5" (1.689 m) Weight: 244 lb (110.7 kg) BMI (Calculated): 38.8 Weight at Last Visit: 243 lb Weight Lost Since Last Visit: 0 Weight Gained Since Last Visit: 1 lb Starting Weight: 249 lb Total Weight Loss (lbs): 5 lb (2.268 kg) Peak Weight: 252 lb Body Composition  Body Fat %: 47.7 % Fat Mass (lbs): 116.6 lbs Muscle Mass (lbs): 121.6 lbs Total Body Water (lbs): 90.8 lbs Visceral Fat Rating : 15 Other Clinical Data Fasting: yes Labs: no Today's Visit #: 95 Starting Date: 09/17/17

## 2023-09-30 NOTE — Assessment & Plan Note (Signed)
 Doing well on metformin with no GI side effects.  Needs a refill of metformin today and will get CMP, A1c and Insulin level today.

## 2023-10-21 ENCOUNTER — Telehealth (INDEPENDENT_AMBULATORY_CARE_PROVIDER_SITE_OTHER): Payer: Commercial Managed Care - PPO | Admitting: Family Medicine

## 2023-10-21 ENCOUNTER — Encounter (INDEPENDENT_AMBULATORY_CARE_PROVIDER_SITE_OTHER): Payer: Self-pay | Admitting: Family Medicine

## 2023-10-21 VITALS — Ht 66.5 in | Wt 246.0 lb

## 2023-10-21 DIAGNOSIS — E785 Hyperlipidemia, unspecified: Secondary | ICD-10-CM | POA: Diagnosis not present

## 2023-10-21 DIAGNOSIS — F5089 Other specified eating disorder: Secondary | ICD-10-CM

## 2023-10-21 DIAGNOSIS — Z6839 Body mass index (BMI) 39.0-39.9, adult: Secondary | ICD-10-CM

## 2023-10-21 DIAGNOSIS — E7849 Other hyperlipidemia: Secondary | ICD-10-CM

## 2023-10-21 DIAGNOSIS — F3289 Other specified depressive episodes: Secondary | ICD-10-CM

## 2023-10-21 DIAGNOSIS — E669 Obesity, unspecified: Secondary | ICD-10-CM

## 2023-10-21 DIAGNOSIS — R7303 Prediabetes: Secondary | ICD-10-CM

## 2023-10-22 NOTE — Progress Notes (Signed)
 Office: [phone]  /  Fax: [phone]  WEIGHT SUMMARY AND BIOMETRICS  Anthropometric Measurements Height: 5' 6.5" (1.689 m) Weight: 246 lb (111.6 kg) (per patient, weighed at home) BMI (Calculated): 39.12   No data recorded Other Clinical Data Fasting: No Labs: No Today's Visit #: 96 Starting Date: 09/17/17 Comments: Virtual Visit    Chief Complaint: OBESITY  Virtual Visit via Telephone Note  I connected with Berry E Degroote on 10/22/23 at 10:00 AM EDT by audiovisual telehealth and verified that I am speaking with the correct person using two identifiers.  Location: Patient: home Provider: home   I discussed the limitations, risks, security and privacy concerns of performing an evaluation and management service by AV telehealth and the availability of in person appointments. I also discussed with the patient that there may be a patient responsible charge related to this service. The patient expressed understanding and agreed to proceed.    History of Present Illness Kazoua Gossen Cervi is a 63 year old female with obesity, prediabetes, and hyperlipidemia who presents for a follow-up on her obesity treatment plan.  She has been experiencing difficulty adhering to her category three obesity treatment plan, particularly due to a recent vacation in [place] where she indulged in eating. Despite this, she attempted portion control and increased her walking, which she believes helped maintain her weight. She struggles with motivation to exercise, finding it challenging to initiate routines, although she feels good post-exercise.  Her prediabetes remains stable with an A1c of 6.1, showing no improvement or worsening. However, her cholesterol levels have worsened, with significant increases in triglycerides and LDL since the last visit. She is on Crestor  but suspects missed doses may have contributed to the elevated levels.  She has a history of emotional eating and is on low-dose  Lexapro  and Wellbutrin . She acknowledges that her snacking worsens when she is bored. She describes herself as introverted and is working on increasing social interactions while maintaining personal time.      PHYSICAL EXAM:  Height 5' 6.5" (1.689 m), weight 246 lb (111.6 kg). Body mass index is 39.11 kg/m.  DIAGNOSTIC DATA REVIEWED:  BMET    Component Value Date/Time   NA 138 09/23/2023 1042   NA 141 04/16/2017 1409   K 3.8 09/23/2023 1042   K 3.8 04/16/2017 1409   CL 99 09/23/2023 1042   CO2 24 09/23/2023 1042   CO2 25 04/16/2017 1409   GLUCOSE 101 (H) 09/23/2023 1042   GLUCOSE 109 (H) 09/19/2021 1319   GLUCOSE 134 04/16/2017 1409   BUN 11 09/23/2023 1042   BUN 12.4 04/16/2017 1409   CREATININE 0.75 09/23/2023 1042   CREATININE 0.87 04/15/2019 0941   CREATININE 0.9 04/16/2017 1409   CALCIUM  9.9 09/23/2023 1042   CALCIUM  9.6 04/16/2017 1409   GFRNONAA >60 09/19/2021 1319   GFRNONAA >60 04/15/2019 0941   GFRAA 102 04/04/2020 0810   GFRAA >60 04/15/2019 0941   Lab Results  Component Value Date   HGBA1C 6.1 (H) 09/23/2023   HGBA1C 6.2 (H) 09/17/2017   Lab Results  Component Value Date   INSULIN  19.9 09/23/2023   INSULIN  24.2 09/17/2017   Lab Results  Component Value Date   TSH 2.050 12/04/2022   CBC    Component Value Date/Time   WBC 6.2 12/04/2022 1158   WBC 7.1 04/15/2019 0941   WBC 6.8 11/11/2017 1127   RBC 4.75 12/04/2022 1158   RBC 4.94 04/15/2019 0941   HGB 13.6 12/04/2022 1158  HGB 13.7 04/16/2017 1409   HCT 40.7 12/04/2022 1158   HCT 41.2 04/16/2017 1409   PLT 280 12/04/2022 1158   MCV 86 12/04/2022 1158   MCV 84.4 04/16/2017 1409   MCH 28.6 12/04/2022 1158   MCH 27.1 04/15/2019 0941   MCHC 33.4 12/04/2022 1158   MCHC 32.4 04/15/2019 0941   RDW 12.2 12/04/2022 1158   RDW 13.2 04/16/2017 1409   Iron Studies    Component Value Date/Time   IRON 82 12/04/2022 1158   TIBC 343 12/04/2022 1158   FERRITIN 65 12/04/2022 1158   IRONPCTSAT 24  12/04/2022 1158   Lipid Panel     Component Value Date/Time   CHOL 251 (H) 09/23/2023 1042   TRIG 314 (H) 09/23/2023 1042   HDL 44 09/23/2023 1042   CHOLHDL 3.9 04/04/2020 0810   LDLCALC 149 (H) 09/23/2023 1042   Hepatic Function Panel     Component Value Date/Time   PROT 6.6 09/23/2023 1042   PROT 7.0 04/16/2017 1409   ALBUMIN 4.5 09/23/2023 1042   ALBUMIN 4.0 04/16/2017 1409   AST 25 09/23/2023 1042   AST 17 04/15/2019 0941   AST 29 04/16/2017 1409   ALT 30 09/23/2023 1042   ALT 19 04/15/2019 0941   ALT 34 04/16/2017 1409   ALKPHOS 94 09/23/2023 1042   ALKPHOS 74 04/16/2017 1409   BILITOT 0.5 09/23/2023 1042   BILITOT 0.3 04/15/2019 0941   BILITOT 0.40 04/16/2017 1409      Component Value Date/Time   TSH 2.050 12/04/2022 1158   Nutritional Lab Results  Component Value Date   VD25OH 51.9 12/04/2022   VD25OH 62.2 02/20/2022   VD25OH 71.0 09/18/2021     Assessment and Plan Assessment & Plan Obesity Obesity management is challenging due to recent vacation and difficulty with motivation for exercise. Despite indulgences, weight was maintained during vacation. Emotional eating behaviors contribute to weight management challenges. - Reinforce adherence to category three eating plan. - Encourage starting with small exercises, such as walking dogs for five minutes daily, to gradually increase activity level.  Emotional eating behaviors Emotional eating behaviors are exacerbated by boredom and introversion. Currently managed with Lexapro  and Wellbutrin . She is aware of the need to reduce snacking when bored and is working on increasing social interactions while maintaining personal time. - Continue Lexapro  and Wellbutrin . - Discuss strategies to reduce snacking due to boredom and increase social interactions.  Hyperlipidemia Hyperlipidemia has worsened with increased triglycerides and LDL levels. She is on Crestor  but may have missed doses, potentially affecting lab  results. Despite changes in eating habits, cholesterol levels are unexpectedly elevated. - Ensure adherence to Crestor  regimen to manage hyperlipidemia.  Prediabetes Prediabetes is well-managed with an A1c of 6.1%. Continued management with metformin  is planned. - Continue metformin  for prediabetes management.     She was informed of the importance of frequent follow up visits to maximize her success with intensive lifestyle modifications for her multiple health conditions.    Jasmine Mesi, MD

## 2023-11-06 ENCOUNTER — Other Ambulatory Visit (HOSPITAL_COMMUNITY): Payer: Self-pay

## 2023-11-20 ENCOUNTER — Other Ambulatory Visit (HOSPITAL_COMMUNITY): Payer: Self-pay

## 2023-11-20 ENCOUNTER — Encounter (INDEPENDENT_AMBULATORY_CARE_PROVIDER_SITE_OTHER): Payer: Self-pay | Admitting: Family Medicine

## 2023-11-20 ENCOUNTER — Ambulatory Visit (INDEPENDENT_AMBULATORY_CARE_PROVIDER_SITE_OTHER): Admitting: Family Medicine

## 2023-11-20 VITALS — BP 133/78 | HR 84 | Temp 98.1°F | Ht 66.5 in | Wt 246.0 lb

## 2023-11-20 DIAGNOSIS — Z6839 Body mass index (BMI) 39.0-39.9, adult: Secondary | ICD-10-CM | POA: Diagnosis not present

## 2023-11-20 DIAGNOSIS — E669 Obesity, unspecified: Secondary | ICD-10-CM | POA: Diagnosis not present

## 2023-11-20 DIAGNOSIS — E785 Hyperlipidemia, unspecified: Secondary | ICD-10-CM

## 2023-11-20 DIAGNOSIS — R7303 Prediabetes: Secondary | ICD-10-CM | POA: Diagnosis not present

## 2023-11-20 MED ORDER — TRIAMTERENE-HCTZ 37.5-25 MG PO TABS
1.0000 | ORAL_TABLET | Freq: Every day | ORAL | 1 refills | Status: DC
  Filled 2023-11-20: qty 90, 90d supply, fill #0

## 2023-11-20 MED ORDER — LOSARTAN POTASSIUM 50 MG PO TABS
50.0000 mg | ORAL_TABLET | Freq: Every day | ORAL | 1 refills | Status: DC
  Filled 2023-11-20: qty 90, 90d supply, fill #0
  Filled 2024-02-25: qty 90, 90d supply, fill #1

## 2023-11-20 NOTE — Assessment & Plan Note (Signed)
 The 10-year ASCVD risk score (Arnett DK, et al., 2019) is: 14.4%   Values used to calculate the score:     Age: 63 years     Sex: Female     Is Non-Hispanic African American: No     Diabetic: Yes     Tobacco smoker: No     Systolic Blood Pressure: 133 mmHg     Is BP treated: Yes     HDL Cholesterol: 44 mg/dL     Total Cholesterol: 251 mg/dL  She is on crestor  now daily at 10mg .  Some restless legs but not a new issue. PCP managing.

## 2023-11-20 NOTE — Progress Notes (Signed)
 SUBJECTIVE:  Chief Complaint: Obesity  Interim History: Since I saw patient last she went to [place] which was beautiful and informative.  Her daughter graduated from [place] recently.  She signed up for a zoom gardening class and she ordered and received an 67ft raised garden bed.  She is planning to do tomatoes, green peppers and peas.  She also looking up the walking groups in the area and one was fairly serious but hasn't found a group she can join yet.  She has increased her lexapro  to daily.   Candace [name] is here to discuss her progress with her obesity treatment plan. She is on the Category 3 Plan and states she is following her eating plan approximately 60 % of the time. She states she is walking more.                                                                OBJECTIVE: Visit Diagnoses: Problem List Items Addressed This Visit       Other   Pre-diabetes   Recent A1c of 6.1.  She is acknowledging that she does indulge in carbs when bored and adds additional carbs to the foods she eats and wants to be more mindful and therefore in control of what she is taking in.      Hyperlipidemia - Primary   The 10-year ASCVD risk score (Arnett DK, et al., 2019) is: 14.4%   Values used to calculate the score:     Age: 63 years     Sex: Female     Is Non-Hispanic African American: No     Diabetic: Yes     Tobacco smoker: No     Systolic Blood Pressure: 133 mmHg     Is BP treated: Yes     HDL Cholesterol: 44 mg/dL     Total Cholesterol: 251 mg/dL  She is on crestor  now daily at 10mg .  Some restless legs but not a new issue. PCP managing.      Obesity, Beginnin BMI 39   Other Visit Diagnoses       BMI 39.0-39.9,adult           Vitals Temp: 98.1 F (36.7 C) BP: 133/78 Pulse Rate: 84 SpO2: 100 %   Anthropometric Measurements Height: 5' 6.5" (1.689 m) Weight: 246 lb (111.6 kg) BMI (Calculated): 39.12 Weight at Last Visit: 244 lb Weight Lost Since Last Visit:  0 Weight Gained Since Last Visit: 0 Starting Weight: 249 lb Total Weight Loss (lbs): 3 lb (1.361 kg) Peak Weight: 252 lb   Body Composition  Body Fat %: 47.5 % Fat Mass (lbs): 117.2 lbs Muscle Mass (lbs): 122.8 lbs Total Body Water (lbs): 91.6 lbs Visceral Fat Rating : 15   Other Clinical Data Today's Visit #: 35 Starting Date: 09/17/17 Comments: Cat 3     ASSESSMENT AND PLAN:  Diet: Candace [name] is currently in the action stage of change. As such, her goal is to continue with weight loss efforts and has agreed to the Category 3 Plan.  Hoping to decrease boredom eating with more consistent activity and increasing protein daily.  Exercise:  For substantial health benefits, adults should do at least 150 minutes (2 hours and 30 minutes) a week of moderate-intensity, or 75 minutes (1 hour  and 15 minutes) a week of vigorous-intensity aerobic physical activity, or an equivalent combination of moderate- and vigorous-intensity aerobic activity. Aerobic activity should be performed in episodes of at least 10 minutes, and preferably, it should be spread throughout the week.  Behavior Modification:  We discussed the following Behavioral Modification Strategies today: increasing lean protein intake, decreasing simple carbohydrates, increasing vegetables, and meal planning and cooking strategies.   Return in about 4 weeks (around 12/18/2023).   She was informed of the importance of frequent follow up visits to maximize her success with intensive lifestyle modifications for her multiple health conditions.  Attestation Statements:   Reviewed by clinician on day of visit: allergies, medications, problem list, medical history, surgical history, family history, social history, and previous encounter notes.    Donaciano Frizzle, MD

## 2023-11-20 NOTE — Assessment & Plan Note (Signed)
 Recent A1c of 6.1.  She is acknowledging that she does indulge in carbs when bored and adds additional carbs to the foods she eats and wants to be more mindful and therefore in control of what she is taking in.

## 2023-12-12 DIAGNOSIS — E119 Type 2 diabetes mellitus without complications: Secondary | ICD-10-CM | POA: Diagnosis not present

## 2023-12-12 DIAGNOSIS — H524 Presbyopia: Secondary | ICD-10-CM | POA: Diagnosis not present

## 2023-12-12 DIAGNOSIS — H52223 Regular astigmatism, bilateral: Secondary | ICD-10-CM | POA: Diagnosis not present

## 2023-12-12 DIAGNOSIS — H5213 Myopia, bilateral: Secondary | ICD-10-CM | POA: Diagnosis not present

## 2023-12-12 DIAGNOSIS — H2513 Age-related nuclear cataract, bilateral: Secondary | ICD-10-CM | POA: Diagnosis not present

## 2023-12-22 ENCOUNTER — Other Ambulatory Visit (INDEPENDENT_AMBULATORY_CARE_PROVIDER_SITE_OTHER): Payer: Self-pay | Admitting: Family Medicine

## 2023-12-22 ENCOUNTER — Other Ambulatory Visit (HOSPITAL_COMMUNITY): Payer: Self-pay

## 2023-12-22 DIAGNOSIS — E7849 Other hyperlipidemia: Secondary | ICD-10-CM

## 2023-12-23 ENCOUNTER — Encounter (HOSPITAL_COMMUNITY): Payer: Self-pay

## 2023-12-23 ENCOUNTER — Other Ambulatory Visit (HOSPITAL_COMMUNITY): Payer: Self-pay

## 2023-12-23 MED ORDER — ROPINIROLE HCL 1 MG PO TABS
1.0000 mg | ORAL_TABLET | Freq: Every day | ORAL | 0 refills | Status: DC
  Filled 2023-12-23: qty 90, 90d supply, fill #0

## 2023-12-30 ENCOUNTER — Ambulatory Visit (INDEPENDENT_AMBULATORY_CARE_PROVIDER_SITE_OTHER): Admitting: Family Medicine

## 2023-12-30 ENCOUNTER — Encounter (INDEPENDENT_AMBULATORY_CARE_PROVIDER_SITE_OTHER): Payer: Self-pay | Admitting: Family Medicine

## 2023-12-30 ENCOUNTER — Other Ambulatory Visit (HOSPITAL_COMMUNITY): Payer: Self-pay

## 2023-12-30 VITALS — BP 136/81 | HR 102 | Temp 98.5°F | Ht 66.5 in | Wt 250.0 lb

## 2023-12-30 DIAGNOSIS — Z6839 Body mass index (BMI) 39.0-39.9, adult: Secondary | ICD-10-CM | POA: Diagnosis not present

## 2023-12-30 DIAGNOSIS — E559 Vitamin D deficiency, unspecified: Secondary | ICD-10-CM

## 2023-12-30 DIAGNOSIS — E7849 Other hyperlipidemia: Secondary | ICD-10-CM

## 2023-12-30 DIAGNOSIS — R0602 Shortness of breath: Secondary | ICD-10-CM

## 2023-12-30 DIAGNOSIS — E785 Hyperlipidemia, unspecified: Secondary | ICD-10-CM | POA: Diagnosis not present

## 2023-12-30 DIAGNOSIS — E669 Obesity, unspecified: Secondary | ICD-10-CM

## 2023-12-30 DIAGNOSIS — R7303 Prediabetes: Secondary | ICD-10-CM | POA: Diagnosis not present

## 2023-12-30 MED ORDER — ROSUVASTATIN CALCIUM 10 MG PO TABS
10.0000 mg | ORAL_TABLET | Freq: Every evening | ORAL | 0 refills | Status: DC
Start: 2023-12-30 — End: 2024-01-29
  Filled 2023-12-30: qty 90, 90d supply, fill #0

## 2023-12-30 NOTE — Assessment & Plan Note (Signed)
 Prior RMR on 09/17/2017 of 2216.  She has been working on lifestyle changes in forms of dietary modifications and increase of activity.  Indirect calorimetry performed again today and resting energy expenditure today of 1814.  Will continue Category 3 with goal of 8oz at supper.

## 2023-12-30 NOTE — Progress Notes (Signed)
 SUBJECTIVE:  Chief Complaint: Obesity  Interim History: life has been busy over the last 6 weeks- graduations, moving in and out of campus.  She feels life has more purpose than she did previously. Her daughters are going in all different directions.  She has garden set up but hasn't put vegetables in yet.  She is doing more activity in her life with focus on her enjoyment.    Candace [name] is here to discuss her progress with her obesity treatment plan. She is on the Category 3 Plan and states she is following her eating plan approximately 60 % of the time. She states she is walking.   OBJECTIVE: Visit Diagnoses: Problem List Items Addressed This Visit       Other   Pre-diabetes   Patient has been working on limiting simple carbohydrates and trying to increase total protein intake.  Repeating labs of cmp, A1c and Insulin  today.  Will discuss labs at next appointment.      Relevant Orders   Comprehensive metabolic panel with GFR (Completed)   Hemoglobin A1c (Completed)   Insulin , random (Completed)   Hyperlipidemia   Needs refill of crestor  today.  No myalgias reported.  Repeat FLP today.      Relevant Medications   rosuvastatin  (CRESTOR ) 10 MG tablet   Other Relevant Orders   Lipid Panel With LDL/HDL Ratio (Completed)   Vitamin D  deficiency   Needs a repeat Vitamin D  level to assess response to treatment.      Relevant Orders   VITAMIN D  25 Hydroxy (Vit-D Deficiency, Fractures) (Completed)   Obesity, Beginnin BMI 39   Anthropometric Measurements Height: 5' 6.5 (1.689 m) Weight: 250 lb (113.4 kg) BMI (Calculated): 39.75 Weight at Last Visit: 244 lb Weight Lost Since Last Visit: 0 Weight Gained Since Last Visit: 6 Starting Weight: 249 lb Total Weight Loss (lbs): 0 lb (0 kg) Body Composition  Body Fat %: 48.2 % Fat Mass (lbs): 120.8 lbs Muscle Mass (lbs): 123.2 lbs Total Body Water (lbs): 92.4 lbs Visceral Fat Rating : 15 Other Clinical Data RMR: 1814 Fasting:  yes Labs: yes Today's Visit #: 98 Starting Date: 09/17/17 Comments: Cat 3       Shortness of breath on exertion   Prior RMR on 09/17/2017 of 2216.  She has been working on lifestyle changes in forms of dietary modifications and increase of activity.  Indirect calorimetry performed again today and resting energy expenditure today of 1814.  Will continue Category 3 with goal of 8oz at supper.      Other Visit Diagnoses       SOB (shortness of breath) [R06.02]    -  Primary     BMI 39.0-39.9,adult           No data recorded      12/30/2023    9:00 AM 11/20/2023   10:37 AM 11/20/2023   10:00 AM  Vitals with BMI  Height 5' 6.5  5' 6.5  Weight 250 lbs  246 lbs  BMI 39.75  39.12  Systolic 136 133 852  Diastolic 81 78 72  Pulse 102  84       ASSESSMENT AND PLAN:  Diet: Candace [name] is currently in the action stage of change. As such, her goal is to continue with weight loss efforts and has agreed to the Category 3 Plan with 8 oz at supper.   Exercise:  For substantial health benefits, adults should do at least 150 minutes (2 hours and 30 minutes) a  week of moderate-intensity, or 75 minutes (1 hour and 15 minutes) a week of vigorous-intensity aerobic physical activity, or an equivalent combination of moderate- and vigorous-intensity aerobic activity. Aerobic activity should be performed in episodes of at least 10 minutes, and preferably, it should be spread throughout the week.  Behavior Modification:  We discussed the following Behavioral Modification Strategies today: increasing lean protein intake, decreasing simple carbohydrates, increasing vegetables, meal planning and cooking strategies, keeping healthy foods in the home, and planning for success.   Return in about 4 weeks (around 01/27/2024).   She was informed of the importance of frequent follow up visits to maximize her success with intensive lifestyle modifications for her multiple health conditions.  Attestation  Statements:   Reviewed by clinician on day of visit: allergies, medications, problem list, medical history, surgical history, family history, social history, and previous encounter notes.     Candace Cho, MD

## 2023-12-31 LAB — COMPREHENSIVE METABOLIC PANEL WITH GFR
ALT: 36 IU/L — ABNORMAL HIGH (ref 0–32)
AST: 27 IU/L (ref 0–40)
Albumin: 4.5 g/dL (ref 3.9–4.9)
Alkaline Phosphatase: 93 IU/L (ref 44–121)
BUN/Creatinine Ratio: 16 (ref 12–28)
BUN: 13 mg/dL (ref 8–27)
Bilirubin Total: 0.3 mg/dL (ref 0.0–1.2)
CO2: 20 mmol/L (ref 20–29)
Calcium: 10.2 mg/dL (ref 8.7–10.3)
Chloride: 99 mmol/L (ref 96–106)
Creatinine, Ser: 0.81 mg/dL (ref 0.57–1.00)
Globulin, Total: 2.1 g/dL (ref 1.5–4.5)
Glucose: 106 mg/dL — ABNORMAL HIGH (ref 70–99)
Potassium: 4.3 mmol/L (ref 3.5–5.2)
Sodium: 139 mmol/L (ref 134–144)
Total Protein: 6.6 g/dL (ref 6.0–8.5)
eGFR: 82 mL/min/{1.73_m2} (ref >59)

## 2023-12-31 LAB — LIPID PANEL WITH LDL/HDL RATIO
Cholesterol, Total: 234 mg/dL — ABNORMAL HIGH (ref 100–199)
HDL: 49 mg/dL (ref >39)
LDL Chol Calc (NIH): 125 mg/dL — ABNORMAL HIGH (ref 0–99)
LDL/HDL Ratio: 2.6 ratio (ref 0.0–3.2)
Triglycerides: 339 mg/dL — ABNORMAL HIGH (ref 0–149)
VLDL Cholesterol Cal: 60 mg/dL — ABNORMAL HIGH (ref 5–40)

## 2023-12-31 LAB — VITAMIN D 25 HYDROXY (VIT D DEFICIENCY, FRACTURES): Vit D, 25-Hydroxy: 33 ng/mL (ref 30.0–100.0)

## 2023-12-31 LAB — INSULIN, RANDOM: INSULIN: 33.5 u[IU]/mL — ABNORMAL HIGH (ref 2.6–24.9)

## 2023-12-31 LAB — HEMOGLOBIN A1C
Est. average glucose Bld gHb Est-mCnc: 126 mg/dL
Hgb A1c MFr Bld: 6 % — ABNORMAL HIGH (ref 4.8–5.6)

## 2024-01-07 NOTE — Assessment & Plan Note (Signed)
 Patient has been working on limiting simple carbohydrates and trying to increase total protein intake.  Repeating labs of cmp, A1c and Insulin  today.  Will discuss labs at next appointment.

## 2024-01-07 NOTE — Assessment & Plan Note (Signed)
 Needs a repeat Vitamin D  level to assess response to treatment.

## 2024-01-07 NOTE — Assessment & Plan Note (Signed)
 Needs refill of crestor  today.  No myalgias reported.  Repeat FLP today.

## 2024-01-07 NOTE — Assessment & Plan Note (Signed)
 Anthropometric Measurements Height: 5' 6.5 (1.689 m) Weight: 250 lb (113.4 kg) BMI (Calculated): 39.75 Weight at Last Visit: 244 lb Weight Lost Since Last Visit: 0 Weight Gained Since Last Visit: 6 Starting Weight: 249 lb Total Weight Loss (lbs): 0 lb (0 kg) Body Composition  Body Fat %: 48.2 % Fat Mass (lbs): 120.8 lbs Muscle Mass (lbs): 123.2 lbs Total Body Water (lbs): 92.4 lbs Visceral Fat Rating : 15 Other Clinical Data RMR: 1814 Fasting: yes Labs: yes Today's Visit #: 98 Starting Date: 09/17/17 Comments: Cat 3

## 2024-01-28 ENCOUNTER — Ambulatory Visit
Admission: RE | Admit: 2024-01-28 | Discharge: 2024-01-28 | Disposition: A | Discharge status: Home / Self Care | Payer: 59 | Source: Ambulatory Visit | Attending: Family Medicine | Admitting: Family Medicine

## 2024-01-28 DIAGNOSIS — Z0000 Encounter for general adult medical examination without abnormal findings: Secondary | ICD-10-CM

## 2024-01-28 DIAGNOSIS — Z1231 Encounter for screening mammogram for malignant neoplasm of breast: Secondary | ICD-10-CM | POA: Diagnosis not present

## 2024-01-29 ENCOUNTER — Ambulatory Visit (INDEPENDENT_AMBULATORY_CARE_PROVIDER_SITE_OTHER): Admitting: Family Medicine

## 2024-01-29 ENCOUNTER — Encounter (INDEPENDENT_AMBULATORY_CARE_PROVIDER_SITE_OTHER): Payer: Self-pay | Admitting: Family Medicine

## 2024-01-29 ENCOUNTER — Other Ambulatory Visit (HOSPITAL_COMMUNITY): Payer: Self-pay

## 2024-01-29 ENCOUNTER — Encounter (HOSPITAL_COMMUNITY): Payer: Self-pay

## 2024-01-29 VITALS — BP 155/84 | HR 84 | Temp 97.9°F | Ht 66.5 in | Wt 253.0 lb

## 2024-01-29 DIAGNOSIS — Z6841 Body Mass Index (BMI) 40.0 and over, adult: Secondary | ICD-10-CM | POA: Diagnosis not present

## 2024-01-29 DIAGNOSIS — R7303 Prediabetes: Secondary | ICD-10-CM | POA: Diagnosis not present

## 2024-01-29 DIAGNOSIS — G4733 Obstructive sleep apnea (adult) (pediatric): Secondary | ICD-10-CM | POA: Diagnosis not present

## 2024-01-29 DIAGNOSIS — E669 Obesity, unspecified: Secondary | ICD-10-CM

## 2024-01-29 DIAGNOSIS — E7849 Other hyperlipidemia: Secondary | ICD-10-CM

## 2024-01-29 MED ORDER — METFORMIN HCL ER 500 MG PO TB24
500.0000 mg | ORAL_TABLET | Freq: Every day | ORAL | 0 refills | Status: DC
  Filled 2024-01-29: qty 90, 90d supply, fill #0

## 2024-01-29 MED ORDER — ROSUVASTATIN CALCIUM 10 MG PO TABS
10.0000 mg | ORAL_TABLET | Freq: Every evening | ORAL | 0 refills | Status: DC
  Filled 2024-01-29: qty 90, 90d supply, fill #0

## 2024-01-29 MED ORDER — ZEPBOUND 2.5 MG/0.5ML ~~LOC~~ SOAJ
2.5000 mg | SUBCUTANEOUS | 0 refills | Status: DC
  Filled 2024-01-29: qty 2, 28d supply, fill #0

## 2024-01-29 NOTE — Progress Notes (Signed)
 SUBJECTIVE:  Chief Complaint: Obesity  Interim History: Patient had a busy July.  She had some questions about her blood work and was concerned about her elevated LFT.  Foodwise she is doing a combination of food.  She is doing breakfast and lunch consistently over the last few weeks.  She is doing 3 homemade meals a week.  The healthy choice meals that are over 20 grams of protein and less than 350 calories she wants to have on hand to eat at work in order to not stop on the way home.  She has been on a cake kick recently.  Candace [name] is here to discuss her progress with her obesity treatment plan. She is on the Category 3 Plan and states she is following her eating plan approximately 65 % of the time. She states she is not exercising 0 minutes 0 times per week.   OBJECTIVE: Visit Diagnoses: Problem List Items Addressed This Visit       Respiratory   OSA (obstructive sleep apnea) - Primary   This study demonstrates severe obstructive sleep apnea, with a total AHI of 53.7/hour, supine AHI of 110.5/hour and O2 nadir of  78%.  Patient would benefit monumentally from treatment with Zepbound  as she has high blood pressure and she cannot try alternative medication for OSA aside for CPAP.  Patient asked about treatment with injectable medication.  Risks and benefits discussed today.  Possible side effects discussed.  Will start Zepbound  2.5mg  weekly.      Relevant Medications   tirzepatide  (ZEPBOUND ) 2.5 MG/0.5ML Pen     Other   Obesity, Beginnin BMI 39   Relevant Medications   tirzepatide  (ZEPBOUND ) 2.5 MG/0.5ML Pen   Other Visit Diagnoses       BMI 40.0-44.9, adult (HCC)       Relevant Medications   tirzepatide  (ZEPBOUND ) 2.5 MG/0.5ML Pen       Vitals Temp: 97.9 F (36.6 C) BP: (!) 155/84 Pulse Rate: 84 SpO2: 96 %   Anthropometric Measurements Height: 5' 6.5 (1.689 m) Weight: 253 lb (114.8 kg) BMI (Calculated): 40.23 Weight at Last Visit: 250lb Weight Lost Since Last  Visit: 0 Weight Gained Since Last Visit: 3lb Starting Weight: 249lb Total Weight Loss (lbs): 0 lb (0 kg)   Body Composition  Body Fat %: 49.5 % Fat Mass (lbs): 125.6 lbs Muscle Mass (lbs): 121.6 lbs Total Body Water (lbs): 94.4 lbs Visceral Fat Rating : 16   Other Clinical Data Fasting: no Labs: no Today's Visit #: 99 Starting Date: 09/17/17 Comments: Cat 3     ASSESSMENT AND PLAN: Assessment & Plan OSA (obstructive sleep apnea) This study demonstrates severe obstructive sleep apnea, with a total AHI of 53.7/hour, supine AHI of 110.5/hour and O2 nadir of  78%.  Patient would benefit monumentally from treatment with Zepbound  as she has high blood pressure and she cannot try alternative medication for OSA aside for CPAP.  Patient asked about treatment with injectable medication.  Risks and benefits discussed today.  Possible side effects discussed.  Will start Zepbound  2.5mg  weekly. Pre-diabetes Tolerating metformin  and needs a refill today.  No significant GI side effects reported.  Continue current dose.  A1c from prior appointment discussed today.  Slight improvement down to 6.0. Other hyperlipidemia Tolerating rosuvastatin  with minimal side effects reported.  Needs a refill of rosuvastatin  today.  No change in dose.  Improvement in LDL to 125 from 149.  Triglycerides remain elevated at 339.  May need to discuss additional medication management  to help control triglyceride level. Obesity, Beginning BMI 39.0  BMI 40.0-44.9, adult (HCC)    Diet: [place]ie is currently in the action stage of change. As such, her goal is to continue with weight loss efforts and has agreed to the Category 3 Plan.   Exercise:  For substantial health benefits, adults should do at least 150 minutes (2 hours and 30 minutes) a week of moderate-intensity, or 75 minutes (1 hour and 15 minutes) a week of vigorous-intensity aerobic physical activity, or an equivalent combination of moderate- and  vigorous-intensity aerobic activity. Aerobic activity should be performed in episodes of at least 10 minutes, and preferably, it should be spread throughout the week.  Behavior Modification:  We discussed the following Behavioral Modification Strategies today: increasing lean protein intake, decreasing simple carbohydrates, increasing vegetables, meal planning and cooking strategies, and emotional eating strategies .   No follow-ups on file.   She was informed of the importance of frequent follow up visits to maximize her success with intensive lifestyle modifications for her multiple health conditions.  Attestation Statements:   Reviewed by clinician on day of visit: allergies, medications, problem list, medical history, surgical history, family history, social history, and previous encounter notes.     [name], MD

## 2024-01-29 NOTE — Assessment & Plan Note (Addendum)
 This study demonstrates severe obstructive sleep apnea, with a total AHI of 53.7/hour, supine AHI of 110.5/hour and O2 nadir of  78%.  Patient would benefit monumentally from treatment with Zepbound  as she has high blood pressure and she cannot try alternative medication for OSA aside for CPAP.  Patient asked about treatment with injectable medication.  Risks and benefits discussed today.  Possible side effects discussed.  Will start Zepbound  2.5mg  weekly.

## 2024-02-03 DIAGNOSIS — Z113 Encounter for screening for infections with a predominantly sexual mode of transmission: Secondary | ICD-10-CM | POA: Diagnosis not present

## 2024-02-03 DIAGNOSIS — Z124 Encounter for screening for malignant neoplasm of cervix: Secondary | ICD-10-CM | POA: Diagnosis not present

## 2024-02-03 DIAGNOSIS — Z01411 Encounter for gynecological examination (general) (routine) with abnormal findings: Secondary | ICD-10-CM | POA: Diagnosis not present

## 2024-02-03 DIAGNOSIS — Z01419 Encounter for gynecological examination (general) (routine) without abnormal findings: Secondary | ICD-10-CM | POA: Diagnosis not present

## 2024-02-05 ENCOUNTER — Encounter: Payer: Self-pay | Admitting: Adult Health

## 2024-02-05 ENCOUNTER — Inpatient Hospital Stay: Payer: Self-pay | Attending: Adult Health | Admitting: Adult Health

## 2024-02-05 VITALS — BP 139/67 | HR 99 | Temp 98.3°F | Resp 18 | Wt 255.9 lb

## 2024-02-05 DIAGNOSIS — R635 Abnormal weight gain: Secondary | ICD-10-CM | POA: Insufficient documentation

## 2024-02-05 DIAGNOSIS — Z923 Personal history of irradiation: Secondary | ICD-10-CM | POA: Diagnosis not present

## 2024-02-05 DIAGNOSIS — Z79811 Long term (current) use of aromatase inhibitors: Secondary | ICD-10-CM | POA: Diagnosis not present

## 2024-02-05 DIAGNOSIS — C50411 Malignant neoplasm of upper-outer quadrant of right female breast: Secondary | ICD-10-CM | POA: Diagnosis not present

## 2024-02-05 DIAGNOSIS — Z170 Estrogen receptor positive status [ER+]: Secondary | ICD-10-CM | POA: Diagnosis not present

## 2024-02-05 DIAGNOSIS — Z803 Family history of malignant neoplasm of breast: Secondary | ICD-10-CM | POA: Insufficient documentation

## 2024-02-05 NOTE — Progress Notes (Signed)
 [facility] Cancer Follow up:    Candace Channel, [place] [id number] [address]. [address] [place] [address]   DIAGNOSIS: Cancer Staging  Malignant neoplasm of upper-outer quadrant of right breast in female, estrogen receptor positive (HCC) Staging form: Breast, AJCC 7th Edition - Pathologic: Stage IA (T1b, N0, cM0) - Unsigned    SUMMARY OF ONCOLOGIC HISTORY: Oncology History  Malignant neoplasm of upper-outer quadrant of right breast in female, estrogen receptor positive (HCC)  04/05/2016 Initial Biopsy   Right breast needle core biopsy (10 o'clock): ILC, ER+(90%), PR+(5%), Ki-67 20%, HER-2 negative (ratio 0.82).    04/23/2016 Genetic Testing   Genetic testing was normal and did not reveal a deleterious mutation, or variant of uncertain significance.  Genes tested include: ATM, BARD1, BRCA1, BRCA2, BRIP1, CDH1, CHEK2, FANCC, MLH1, MSH2, MSH6, NBN, PALB2, PMS2, PTEN, RAD51C, RAD51D, TP53, and XRCC2.  This panel also includes deletion/duplication analysis (without sequencing) for one gene, EPCAM.     04/23/2016 Oncotype testing   Score: 22, 14%   05/02/2016 Initial Diagnosis   Malignant neoplasm of upper-outer quadrant of right breast in female, estrogen receptor positive (HCC)   05/28/2016 Surgery   Right breast lumpectomy Candace [name]): ILC, grade 2, 1cm, 4 SLN negative, T1b, N0   07/22/2016 - 09/04/2016 Radiation Therapy   Adjuvant radiation [facility]): 1) Right breast/ 50.4 Gy in 28 fractions.  2) Right breast boost/ 10 Gy in 5 fractions   09/2016 -  Anti-estrogen oral therapy   Anastrozole  1mg  daily     CURRENT THERAPY: Anastrozole   INTERVAL HISTORY:  Discussed the use of AI scribe software for clinical note transcription with the patient, who gave verbal consent to proceed.  History of Present Illness Candace [name] is a 63 year old female with a history of right-sided stage 1A invasive lobular carcinoma who presents for a follow-up visit.  She was diagnosed with  right-sided stage 1A invasive lobular carcinoma, ERPR positive, in October 2017. She underwent a lumpectomy followed by adjuvant radiation and is currently on anastrozole , which she will continue until April 2025. She tolerates anastrozole  well but is concerned about weight gain.  Her most recent mammogram on January 28, 2024, showed no evidence of malignancy, with breast density categorized as B. Her bone density test in October 2020 showed normal levels.  She experiences abdominal discomfort when overeating or consuming sugar, which she attributes to gas. High protein foods do not cause this issue. She is involved in a 12-step recovery program and plans to return to therapy to address her relationship with food. She feels detached and isolated at home but remains functional at work.  She experiences shortness of breath when walking and acknowledges being out of shape. She is considering lifestyle changes to improve her health, including addressing eating habits and increasing social interactions.     Patient Active Problem List   Diagnosis Date Noted   OSA (obstructive sleep apnea) 01/29/2024   Shortness of breath on exertion 12/30/2023   Post-viral cough syndrome 05/08/2023   BMI 38.0-38.9,adult 12/04/2022   White coat syndrome with diagnosis of hypertension 08/29/2022   BMI 36.0-36.9,adult 07/31/2022   Obesity, Beginnin BMI 39 07/31/2022   Functional diarrhea 06/19/2022   Absolute anemia 03/26/2022   Vitamin D  deficiency 02/20/2022   Diabetes mellitus (HCC) 11/22/2021   Essential hypertension 11/22/2021   At risk for heart disease 10/30/2021   Trigger finger, right middle finger 09/20/2021   Non-obstructive CAD    GERD (gastroesophageal reflux disease)  Type 2 diabetes mellitus (HCC)    Hyperlipidemia    Pre-diabetes 02/06/2021   Hypertension 02/06/2021   Class 2 severe obesity with serious comorbidity and body mass index (BMI) of 39.0 to 39.9 in adult [facility]) 02/06/2021   Genetic  testing 05/13/2016   Malignant neoplasm of upper-outer quadrant of right breast in female, estrogen receptor positive (HCC) 05/02/2016   Family history of breast cancer in sister 04/23/2016   Depression    Endometrial hyperplasia    Oligomenorrhea    Insomnia    H/O hemorrhoids    Epidermal cyst    High risk HPV infection     has no known allergies.  MEDICAL HISTORY: Past Medical History:  Diagnosis Date   Abnormal Pap smear    Alcohol abuse    Anovulation    Anxiety    Breast cancer (HCC) 04/05/2016   right breast   Depression    Endometrial hyperplasia    Endometrial polyp    h/o   Epidermal cyst    h/o   Gallbladder problem    GERD (gastroesophageal reflux disease)    H/O hemorrhoids    H/O menorrhagia    High risk HPV infection    Hyperlipidemia    Hypertension    Insomnia    Non-obstructive CAD    minimal non-obstructive CAD noted on coronary CTA in 05/2021   Obesity    Oligomenorrhea    Prediabetes    Restless legs syndrome    Seasonal allergies    Sleep apnea    Type 2 diabetes mellitus (HCC)    Vitamin D  deficiency     SURGICAL HISTORY: Past Surgical History:  Procedure Laterality Date   BREAST LUMPECTOMY Right 05/2016   radiation   BREAST LUMPECTOMY WITH RADIOACTIVE SEED AND SENTINEL LYMPH NODE BIOPSY Right 05/28/2016   Procedure: RADIOACTIVE SEED GUIDED RIGHT BREAST LUMPECTOMY WITH  RIGHT AXILLARY SENTINEL LYMPH NODE BIOPSY;  Surgeon: Candace Bury, MD;  Location: [facility] OR;  Service: General;  Laterality: Right;   CARPAL TUNNEL RELEASE  03/26/2011   right hand   CARPAL TUNNEL RELEASE  05/28/2011   Procedure: CARPAL TUNNEL RELEASE;  Surgeon: Candace [name];  Location: [facility] ORS;  Service: Orthopedics;  Laterality: Left;   CHOLECYSTECTOMY N/A 01/24/2013   Procedure: LAPAROSCOPIC CHOLECYSTECTOMY WITH INTRAOPERATIVE CHOLANGIOGRAM;  Surgeon: Candace POUR. Belinda, MD;  Location: [facility] OR;  Service: General;  Laterality: N/A;   COLONOSCOPY      HYSTEROSCOPY     POLYPECTOMY     TONSILLECTOMY  1967   as child   TRIGGER FINGER RELEASE Right 09/20/2021   Procedure: RIGHT MIDDLE FINGER A-1 PULLEY RELEASE;  Surgeon: [name] Candace CINDERELLA, MD;  Location: [facility];  Service: Orthopedics;  Laterality: Right;   trigger fingers  8 yrs. ago   both done months apart   uterine ablation  06/04/2010   uterine ablation      SOCIAL HISTORY: Social History   Socioeconomic History   Marital status: Divorced    Spouse name: Not on file   Number of children: 2   Years of education: Not on file   Highest education level: Not on file  Occupational History   Not on file  Tobacco Use   Smoking status: Never   Smokeless tobacco: Never  Vaping Use   Vaping status: Never Used  Substance and Sexual Activity   Alcohol use: No    Comment: none since 1996   Drug use: No   Sexual activity: Not Currently  Birth control/protection: None  Other Topics Concern   Not on file  Social History Narrative   Not on file   Social Drivers of Health   Financial Resource Strain: Not on file  Food Insecurity: Not on file  Transportation Needs: Not on file  Physical Activity: Not on file  Stress: Not on file  Social Connections: Not on file  Intimate Partner Violence: Not on file    FAMILY HISTORY: Family History  Problem Relation Age of Onset   Heart disease Mother    Hypertension Mother    Stroke Mother    Dementia Mother        vascular dementia   Hyperlipidemia Mother    Depression Mother    Anxiety disorder Mother    Alcohol abuse Mother    Obesity Mother    Pulmonary fibrosis Father        d. 30   Depression Father    Anxiety disorder Father    Alcohol abuse Father    Heart attack Maternal Grandmother        d. 21-65y   Heart disease Maternal Grandmother    Heart disease Maternal Grandfather        d. 12-63   Leukemia Paternal Grandmother        d. late 76s   Breast cancer Sister 20       IDC s/p  mastectomy and chemo    Review of Systems  Constitutional:  Negative for appetite change, chills, fatigue, fever and unexpected weight change.  HENT:   Negative for hearing loss, lump/mass and trouble swallowing.   Eyes:  Negative for eye problems and icterus.  Respiratory:  Negative for chest tightness, cough and shortness of breath.   Cardiovascular:  Negative for chest pain, leg swelling and palpitations.  Gastrointestinal:  Negative for abdominal distention, abdominal pain, constipation, diarrhea, nausea and vomiting.  Endocrine: Negative for hot flashes.  Genitourinary:  Negative for difficulty urinating.   Musculoskeletal:  Negative for arthralgias.  Skin:  Negative for itching and rash.  Neurological:  Negative for dizziness, extremity weakness, headaches and numbness.  Hematological:  Negative for adenopathy. Does not bruise/bleed easily.  Psychiatric/Behavioral:  Negative for depression. The patient is not nervous/anxious.       PHYSICAL EXAMINATION    Vitals:   02/05/24 1016 02/05/24 1017  BP: (!) 153/69 139/67  Pulse: 99   Resp: 18   Temp: 98.3 F (36.8 C)   SpO2: 97%     Physical Exam Constitutional:      General: She is not in acute distress.    Appearance: Normal appearance. She is not toxic-appearing.  HENT:     Head: Normocephalic and atraumatic.     Mouth/Throat:     Mouth: Mucous membranes are moist.     Pharynx: Oropharynx is clear. No oropharyngeal exudate or posterior oropharyngeal erythema.  Eyes:     General: No scleral icterus. Cardiovascular:     Rate and Rhythm: Normal rate and regular rhythm.     Pulses: Normal pulses.     Heart sounds: Normal heart sounds.  Pulmonary:     Effort: Pulmonary effort is normal.     Breath sounds: Normal breath sounds.  Chest:     Comments: Right breast s/p lumpectomy and radiation, no sign of local recurrence, left breast benign Abdominal:     General: Abdomen is flat. Bowel sounds are normal. There is no  distension.     Palpations: Abdomen is soft.  Tenderness: There is no abdominal tenderness.  Musculoskeletal:        General: No swelling.     Cervical back: Neck supple.  Lymphadenopathy:     Cervical: No cervical adenopathy.     Upper Body:     Right upper body: No supraclavicular or axillary adenopathy.     Left upper body: No supraclavicular or axillary adenopathy.  Skin:    General: Skin is warm and dry.     Findings: No rash.  Neurological:     General: No focal deficit present.     Mental Status: She is alert.  Psychiatric:        Mood and Affect: Mood normal.        Behavior: Behavior normal.     ASSESSMENT and THERAPY PLAN:   Assessment and Plan Assessment & Plan Right-sided stage 1A ER/PR-positive invasive lobular carcinoma of breast, status post-lumpectomy, adjuvant radiation, and anastrozole  therapy Diagnosed in October 2017, status post-lumpectomy, adjuvant radiation, and anastrozole  therapy. Recent mammogram on January 28, 2024, showed no mammographic evidence of malignancy. Anastrozole  therapy completed in April 2025 with no current issues. - Continue annual mammograms. - Ensure annual clinical breast exams.  Breast asymmetry post-lumpectomy and radiation Right breast is smaller due to previous radiation and surgery. Discussed potential impact of weight changes on breast size and asymmetry. - Provide information on inserts to help balance breast asymmetry. - Consider referral to a plastic surgeon if asymmetry becomes problematic.  Weight gain Weight gain noted. Discussed the importance of diet and exercise in managing weight. - Encourage consumption of plenty of fruits and vegetables. - Recommend 150 to 300 minutes of exercise per week.  Gastrointestinal discomfort related to diet Reports gastrointestinal discomfort related to diet, particularly with overeating or high sugar intake. Symptoms improve with high protein diet. - Optimize diet to reduce  gastrointestinal discomfort.  RTC PRN    All questions were answered. The patient knows to call the clinic with any problems, questions or concerns. We can certainly see the patient much sooner if necessary.  Total encounter time:30 minutes*in face-to-face visit time, chart review, lab review, care coordination, order entry, and documentation of the encounter time.    Morna Kendall, NP 02/05/24 10:25 AM Medical Oncology and Hematology [facility] [address] [place], [place] [address] Tel. [phone]    Fax. [phone]  *Total Encounter Time as defined by the Centers for Medicare and Medicaid Services includes, in addition to the face-to-face time of a patient visit (documented in the note above) non-face-to-face time: obtaining and reviewing outside history, ordering and reviewing medications, tests or procedures, care coordination (communications with other health care professionals or caregivers) and documentation in the medical record.

## 2024-02-05 NOTE — Assessment & Plan Note (Signed)
 Tolerating metformin  and needs a refill today.  No significant GI side effects reported.  Continue current dose.  A1c from prior appointment discussed today.  Slight improvement down to 6.0.

## 2024-02-05 NOTE — Assessment & Plan Note (Signed)
 Tolerating rosuvastatin  with minimal side effects reported.  Needs a refill of rosuvastatin  today.  No change in dose.  Improvement in LDL to 125 from 149.  Triglycerides remain elevated at 339.  May need to discuss additional medication management to help control triglyceride level.

## 2024-02-09 ENCOUNTER — Other Ambulatory Visit (HOSPITAL_COMMUNITY): Payer: Self-pay

## 2024-02-20 ENCOUNTER — Other Ambulatory Visit (HOSPITAL_COMMUNITY): Payer: Self-pay

## 2024-02-20 MED ORDER — ESCITALOPRAM OXALATE 5 MG PO TABS
5.0000 mg | ORAL_TABLET | Freq: Every day | ORAL | 0 refills | Status: DC
  Filled 2024-02-20: qty 90, 90d supply, fill #0

## 2024-02-20 MED ORDER — ROPINIROLE HCL 1 MG PO TABS
1.0000 mg | ORAL_TABLET | Freq: Every day | ORAL | 0 refills | Status: DC
  Filled 2024-02-20: qty 90, 90d supply, fill #0

## 2024-02-25 ENCOUNTER — Ambulatory Visit (INDEPENDENT_AMBULATORY_CARE_PROVIDER_SITE_OTHER): Admitting: Family Medicine

## 2024-02-25 ENCOUNTER — Other Ambulatory Visit (HOSPITAL_COMMUNITY): Payer: Self-pay

## 2024-02-25 ENCOUNTER — Encounter (INDEPENDENT_AMBULATORY_CARE_PROVIDER_SITE_OTHER): Payer: Self-pay | Admitting: Family Medicine

## 2024-02-25 VITALS — BP 136/78 | HR 78 | Temp 98.4°F | Ht 66.5 in | Wt 255.0 lb

## 2024-02-25 DIAGNOSIS — Z6841 Body Mass Index (BMI) 40.0 and over, adult: Secondary | ICD-10-CM | POA: Diagnosis not present

## 2024-02-25 DIAGNOSIS — G4733 Obstructive sleep apnea (adult) (pediatric): Secondary | ICD-10-CM | POA: Diagnosis not present

## 2024-02-25 DIAGNOSIS — E669 Obesity, unspecified: Secondary | ICD-10-CM | POA: Diagnosis not present

## 2024-02-25 NOTE — Progress Notes (Signed)
   SUBJECTIVE:  Chief Complaint: Obesity  Interim History: Patient has been traveling back and forth to [place], taking care of her kids and moving her kids.  She reconnected with her therapist. Her therapist has a certification in mood and food and she is now working on relationship with food and her health. She has also made a commitment to do something enjoyable for 10 minutes a day that involve movement.  She is also cutting out as much processed food as she can.  She feels she is doing better overall.  Thinks she wants to start eating yogurt with fruit when she wakes up to decrease the toast she is eating. She isn't feeling as lonely as she was before.  She is going out of town to [facility] for the beginning of Labor Day weekend.   Candace [name] is here to discuss her progress with her obesity treatment plan. She is on the Category 3 Plan and states she is following her eating plan approximately 60 % of the time. She states she is exercising 10 minutes 5 times per week.   OBJECTIVE: Visit Diagnoses: Problem List Items Addressed This Visit   None   Vitals Temp: 98.4 F (36.9 C) BP: 136/78 Pulse Rate: 78 SpO2: 91 %   Anthropometric Measurements Height: 5' 6.5 (1.689 m) Weight: 255 lb (115.7 kg) BMI (Calculated): 40.55 Weight at Last Visit: 253 lb Weight Lost Since Last Visit: 0 Weight Gained Since Last Visit: 2 Starting Weight: 249 lb Total Weight Loss (lbs): 0 lb (0 kg)   Body Composition  Body Fat %: 49 % Fat Mass (lbs): 125.2 lbs Muscle Mass (lbs): 123.8 lbs Total Body Water (lbs): 95.6 lbs Visceral Fat Rating : 16   Other Clinical Data Today's Visit #: 100 Starting Date: 09/17/17 Comments: Cat 3     ASSESSMENT AND PLAN: Assessment & Plan OSA (obstructive sleep apnea) Patient has been working on consistency and compliance on wearing CPAP.  Mentions improvement in sleep when she is adherent to this.  Insurance would not cover Zepbound  2.5 mg..  Will  follow-up at subsequent appointments to further discuss medication management pending available benefits for 2026. Obesity, Beginning BMI 39.0  BMI 40.0-44.9, adult (HCC)    Diet: Candace [name] is currently in the action stage of change. As such, her goal is to continue with weight loss efforts and has agreed to the Category 3 Plan.   Exercise:  For substantial health benefits, adults should do at least 150 minutes (2 hours and 30 minutes) a week of moderate-intensity, or 75 minutes (1 hour and 15 minutes) a week of vigorous-intensity aerobic physical activity, or an equivalent combination of moderate- and vigorous-intensity aerobic activity. Aerobic activity should be performed in episodes of at least 10 minutes, and preferably, it should be spread throughout the week.  Her ultimate goal is to be consistent in her daily movement of at least ten minutes.  Behavior Modification:  We discussed the following Behavioral Modification Strategies today: increasing lean protein intake, decreasing simple carbohydrates, increasing vegetables, meal planning and cooking strategies, and planning for success.   Return in about 5 weeks (around 03/31/2024).   She was informed of the importance of frequent follow up visits to maximize her success with intensive lifestyle modifications for her multiple health conditions.  Attestation Statements:   Reviewed by clinician on day of visit: allergies, medications, problem list, medical history, surgical history, family history, social history, and previous encounter notes.    [name], MD

## 2024-02-26 ENCOUNTER — Other Ambulatory Visit (HOSPITAL_COMMUNITY): Payer: Self-pay

## 2024-02-26 MED ORDER — BUPROPION HCL ER (XL) 300 MG PO TB24
300.0000 mg | ORAL_TABLET | Freq: Every morning | ORAL | 0 refills | Status: DC
  Filled 2024-02-26: qty 90, 90d supply, fill #0

## 2024-03-02 ENCOUNTER — Other Ambulatory Visit (HOSPITAL_COMMUNITY): Payer: Self-pay

## 2024-03-02 DIAGNOSIS — F3341 Major depressive disorder, recurrent, in partial remission: Secondary | ICD-10-CM | POA: Diagnosis not present

## 2024-03-02 DIAGNOSIS — F419 Anxiety disorder, unspecified: Secondary | ICD-10-CM | POA: Diagnosis not present

## 2024-03-02 DIAGNOSIS — G2581 Restless legs syndrome: Secondary | ICD-10-CM | POA: Diagnosis not present

## 2024-03-02 DIAGNOSIS — I10 Essential (primary) hypertension: Secondary | ICD-10-CM | POA: Diagnosis not present

## 2024-03-02 MED ORDER — BUPROPION HCL ER (XL) 300 MG PO TB24
300.0000 mg | ORAL_TABLET | Freq: Every morning | ORAL | 1 refills | Status: DC
  Filled 2024-03-02 – 2024-06-03 (×2): qty 90, 90d supply, fill #0

## 2024-03-02 MED ORDER — TRIAMTERENE-HCTZ 37.5-25 MG PO TABS
1.0000 | ORAL_TABLET | Freq: Every day | ORAL | 1 refills | Status: AC
  Filled 2024-03-02: qty 90, 90d supply, fill #0
  Filled 2024-07-05: qty 90, 90d supply, fill #1

## 2024-03-02 MED ORDER — LOSARTAN POTASSIUM 50 MG PO TABS: 50.0000 mg | ORAL_TABLET | Freq: Every day | ORAL | 1 refills | Status: AC

## 2024-03-02 MED ORDER — ESCITALOPRAM OXALATE 5 MG PO TABS
5.0000 mg | ORAL_TABLET | Freq: Every day | ORAL | 1 refills | Status: DC
  Filled 2024-06-03: qty 90, 90d supply, fill #0

## 2024-03-02 MED ORDER — GABAPENTIN 100 MG PO CAPS
100.0000 mg | ORAL_CAPSULE | Freq: Every day | ORAL | 5 refills | Status: DC
  Filled 2024-03-02: qty 30, 30d supply, fill #0

## 2024-03-03 NOTE — Assessment & Plan Note (Signed)
 Patient has been working on consistency and compliance on wearing CPAP.  Mentions improvement in sleep when she is adherent to this.  Insurance would not cover Zepbound  2.5 mg..  Will follow-up at subsequent appointments to further discuss medication management pending available benefits for 2026.

## 2024-03-16 ENCOUNTER — Other Ambulatory Visit (HOSPITAL_COMMUNITY): Payer: Self-pay

## 2024-03-16 MED ORDER — GABAPENTIN 300 MG PO CAPS
300.0000 mg | ORAL_CAPSULE | Freq: Every day | ORAL | 0 refills | Status: DC
  Filled 2024-03-16: qty 30, 30d supply, fill #0

## 2024-04-01 ENCOUNTER — Other Ambulatory Visit (HOSPITAL_COMMUNITY): Payer: Self-pay

## 2024-04-01 ENCOUNTER — Ambulatory Visit (INDEPENDENT_AMBULATORY_CARE_PROVIDER_SITE_OTHER): Admitting: Family Medicine

## 2024-04-01 ENCOUNTER — Encounter (INDEPENDENT_AMBULATORY_CARE_PROVIDER_SITE_OTHER): Payer: Self-pay | Admitting: Family Medicine

## 2024-04-01 VITALS — BP 127/68 | HR 80 | Temp 97.7°F | Ht 66.5 in | Wt 257.0 lb

## 2024-04-01 DIAGNOSIS — Z6841 Body Mass Index (BMI) 40.0 and over, adult: Secondary | ICD-10-CM | POA: Diagnosis not present

## 2024-04-01 DIAGNOSIS — E669 Obesity, unspecified: Secondary | ICD-10-CM | POA: Diagnosis not present

## 2024-04-01 DIAGNOSIS — R7303 Prediabetes: Secondary | ICD-10-CM | POA: Diagnosis not present

## 2024-04-01 DIAGNOSIS — I10 Essential (primary) hypertension: Secondary | ICD-10-CM | POA: Diagnosis not present

## 2024-04-01 MED ORDER — METFORMIN HCL ER 500 MG PO TB24
500.0000 mg | ORAL_TABLET | Freq: Two times a day (BID) | ORAL | 0 refills | Status: DC
  Filled 2024-04-01: qty 180, 90d supply, fill #0

## 2024-04-01 NOTE — Assessment & Plan Note (Signed)
 Blood pressure well controlled today.  No chest pain, chest pressure or headache.  Needs no refills today.  Continue current medications at current doses.  Continue current medications at current doses.  BP in higher range of normal today prior to appointment.

## 2024-04-01 NOTE — Assessment & Plan Note (Signed)
 She is on metformin  with no GI side effects of her medication.  She needs a refill today and is open to starting a higher dose. Will increase metformin  to twice a day to help decrease risk of side effects of increasing to 1000mg  in the am.

## 2024-04-01 NOTE — Progress Notes (Signed)
 SUBJECTIVE:  Chief Complaint: Obesity  Interim History: Patient stopped by [employer] and got 4 meals for $30 and enjoyed that.  She enjoyed the options and felt they were well made.  She does anticipate going back again.  Felt the meals were always more than what she could eat.  Feels these are the better option than her trying to make a meal or buy food on the fly.  She has been in and out of town quite a bit over the last month.  She went to her niece's last week to pack up.  Her brother and his wife were very health oriented but the weather was hot and humid so she didn't eat much.  She has transitioned off ropinirole  and is on gabapentin  now. She is not sleeping well and is feeling very frustrated with sleep changes. She is noticing a side effect of falling asleep if she gets relaxed.  She is meeting with Haroldine Salt again for therapy.  Jamiyah is here to discuss her progress with her obesity treatment plan. She is on the Category 3 Plan and states she is following her eating plan approximately 50 % of the time. She states she is walking and moving.  OBJECTIVE: Visit Diagnoses: Problem List Items Addressed This Visit       Cardiovascular and Mediastinum   Essential hypertension     Other   Pre-diabetes - Primary   Relevant Medications   metFORMIN  (GLUCOPHAGE -XR) 500 MG 24 hr tablet   Obesity, Beginnin BMI 39   Relevant Medications   metFORMIN  (GLUCOPHAGE -XR) 500 MG 24 hr tablet   Other Visit Diagnoses       BMI 40.0-44.9, adult (HCC)       Relevant Medications   metFORMIN  (GLUCOPHAGE -XR) 500 MG 24 hr tablet       Vitals Temp: 97.7 F (36.5 C) BP: 127/68 Pulse Rate: 80 SpO2: 97 %   Anthropometric Measurements Height: 5' 6.5 (1.689 m) Weight: 257 lb (116.6 kg) BMI (Calculated): 40.86 Weight at Last Visit: 255 lb Weight Lost Since Last Visit: 0 Weight Gained Since Last Visit: 2 Starting Weight: 249 lb Total Weight Loss (lbs): 0 lb (0 kg)   Body  Composition  Body Fat %: 49.2 % Fat Mass (lbs): 126.6 lbs Muscle Mass (lbs): 124.2 lbs Total Body Water (lbs): 95.8 lbs Visceral Fat Rating : 16   Other Clinical Data Today's Visit #: 101 Starting Date: 09/17/17 Comments: Cat 3     ASSESSMENT AND PLAN: Assessment & Plan Essential hypertension Blood pressure well controlled today.  No chest pain, chest pressure or headache.  Needs no refills today.  Continue current medications at current doses.  Continue current medications at current doses.  BP in higher range of normal today prior to appointment.  Pre-diabetes She is on metformin  with no GI side effects of her medication.  She needs a refill today and is open to starting a higher dose. Will increase metformin  to twice a day to help decrease risk of side effects of increasing to 1000mg  in the am.  Obesity, Beginning BMI 39.0  BMI 40.0-44.9, adult (HCC)    Diet: Jahanna is currently in the action stage of change. As such, her goal is to continue with weight loss efforts and has agreed to the Category 3 Plan.   Exercise:  For substantial health benefits, adults should do at least 150 minutes (2 hours and 30 minutes) a week of moderate-intensity, or 75 minutes (1 hour and 15  minutes) a week of vigorous-intensity aerobic physical activity, or an equivalent combination of moderate- and vigorous-intensity aerobic activity. Aerobic activity should be performed in episodes of at least 10 minutes, and preferably, it should be spread throughout the week.  Behavior Modification:  We discussed the following Behavioral Modification Strategies today: increasing lean protein intake, decreasing simple carbohydrates, keeping healthy foods in the home, and planning for success.   Return in about 5 weeks (around 05/06/2024).   She was informed of the importance of frequent follow up visits to maximize her success with intensive lifestyle modifications for her multiple health  conditions.  Attestation Statements:   Reviewed by clinician on day of visit: allergies, medications, problem list, medical history, surgical history, family history, social history, and previous encounter notes.     [name], MD

## 2024-04-07 ENCOUNTER — Other Ambulatory Visit: Payer: 59

## 2024-04-07 ENCOUNTER — Other Ambulatory Visit (HOSPITAL_COMMUNITY): Payer: Self-pay

## 2024-04-07 MED ORDER — GABAPENTIN 300 MG PO CAPS
300.0000 mg | ORAL_CAPSULE | Freq: Every day | ORAL | 1 refills | Status: AC
  Filled 2024-04-07 – 2024-04-13 (×2): qty 90, 90d supply, fill #0
  Filled 2024-07-05: qty 90, 90d supply, fill #1

## 2024-04-13 ENCOUNTER — Other Ambulatory Visit (HOSPITAL_COMMUNITY): Payer: Self-pay

## 2024-04-21 ENCOUNTER — Other Ambulatory Visit (HOSPITAL_COMMUNITY): Payer: Self-pay

## 2024-04-21 DIAGNOSIS — E559 Vitamin D deficiency, unspecified: Secondary | ICD-10-CM | POA: Diagnosis not present

## 2024-04-21 DIAGNOSIS — Z0000 Encounter for general adult medical examination without abnormal findings: Secondary | ICD-10-CM | POA: Diagnosis not present

## 2024-04-21 DIAGNOSIS — E785 Hyperlipidemia, unspecified: Secondary | ICD-10-CM | POA: Diagnosis not present

## 2024-04-21 DIAGNOSIS — Z853 Personal history of malignant neoplasm of breast: Secondary | ICD-10-CM | POA: Diagnosis not present

## 2024-04-21 DIAGNOSIS — F419 Anxiety disorder, unspecified: Secondary | ICD-10-CM | POA: Diagnosis not present

## 2024-04-21 DIAGNOSIS — I10 Essential (primary) hypertension: Secondary | ICD-10-CM | POA: Diagnosis not present

## 2024-04-21 DIAGNOSIS — G2581 Restless legs syndrome: Secondary | ICD-10-CM | POA: Diagnosis not present

## 2024-04-21 DIAGNOSIS — R7303 Prediabetes: Secondary | ICD-10-CM | POA: Diagnosis not present

## 2024-04-21 DIAGNOSIS — F3341 Major depressive disorder, recurrent, in partial remission: Secondary | ICD-10-CM | POA: Diagnosis not present

## 2024-04-21 DIAGNOSIS — G4733 Obstructive sleep apnea (adult) (pediatric): Secondary | ICD-10-CM | POA: Diagnosis not present

## 2024-04-21 MED ORDER — LOSARTAN POTASSIUM 50 MG PO TABS
50.0000 mg | ORAL_TABLET | Freq: Every day | ORAL | 1 refills | Status: AC
  Filled 2024-04-21 – 2024-07-06 (×3): qty 90, 90d supply, fill #0

## 2024-04-21 MED ORDER — BUPROPION HCL ER (XL) 300 MG PO TB24
300.0000 mg | ORAL_TABLET | Freq: Every morning | ORAL | 1 refills | Status: AC
  Filled 2024-04-21 – 2024-07-06 (×2): qty 90, 90d supply, fill #0

## 2024-04-21 MED ORDER — GABAPENTIN 300 MG PO CAPS
300.0000 mg | ORAL_CAPSULE | Freq: Every day | ORAL | 1 refills | Status: AC
  Filled 2024-04-21: qty 90, 90d supply, fill #0

## 2024-04-21 MED ORDER — FERROUS SULFATE 325 (65 FE) MG PO TABS
325.0000 mg | ORAL_TABLET | Freq: Every day | ORAL | 3 refills | Status: AC
  Filled 2024-04-21: qty 90, 90d supply, fill #0

## 2024-04-21 MED ORDER — ESCITALOPRAM OXALATE 5 MG PO TABS
5.0000 mg | ORAL_TABLET | Freq: Every day | ORAL | 1 refills | Status: AC
  Filled 2024-04-21 – 2024-07-06 (×2): qty 90, 90d supply, fill #0

## 2024-04-21 MED ORDER — TRIAMTERENE-HCTZ 37.5-25 MG PO TABS
1.0000 | ORAL_TABLET | Freq: Every day | ORAL | 1 refills | Status: AC
  Filled 2024-04-21 – 2024-06-03 (×2): qty 90, 90d supply, fill #0

## 2024-04-21 MED ORDER — ROSUVASTATIN CALCIUM 10 MG PO TABS
10.0000 mg | ORAL_TABLET | Freq: Every evening | ORAL | 3 refills | Status: AC
  Filled 2024-04-21: qty 90, 90d supply, fill #0
  Filled 2024-07-23: qty 90, 90d supply, fill #1

## 2024-05-05 ENCOUNTER — Ambulatory Visit (INDEPENDENT_AMBULATORY_CARE_PROVIDER_SITE_OTHER): Admitting: Family Medicine

## 2024-05-05 VITALS — BP 138/79 | HR 80 | Temp 97.7°F | Ht 66.5 in | Wt 253.0 lb

## 2024-05-05 DIAGNOSIS — E7849 Other hyperlipidemia: Secondary | ICD-10-CM | POA: Diagnosis not present

## 2024-05-05 DIAGNOSIS — Z6841 Body Mass Index (BMI) 40.0 and over, adult: Secondary | ICD-10-CM | POA: Diagnosis not present

## 2024-05-05 DIAGNOSIS — E669 Obesity, unspecified: Secondary | ICD-10-CM

## 2024-05-05 DIAGNOSIS — R7303 Prediabetes: Secondary | ICD-10-CM | POA: Diagnosis not present

## 2024-05-05 NOTE — Progress Notes (Signed)
 SUBJECTIVE:  Chief Complaint: Obesity  Interim History: Patient here for follow up. She celebrated her birthday very peacefully.  She was supposed to go to [place] with her daughters but stayed local.  She is staying here for the month and working extra.  She has picked up an extra shift every week.  She will bring sweet potato casserole and pound cake to her Thanksgiving gathering.  Right now her daughters soccer team is done for the season so she is planning to be home.  She is trying to get into the habit of walking first thing in the am.She is going to [employer] and is enjoying the convenience.  Candace [name] is here to discuss her progress with her obesity treatment plan. She is on the Category 3 Plan and states she is following her eating plan approximately 70 % of the time. She states she is not exercising consistently.   OBJECTIVE: Visit Diagnoses: Problem List Items Addressed This Visit       Other   Pre-diabetes - Primary   Recent A1c of 6.1 on labs with PCP discussed today.  Patient is now on metformin  twice a day of 1000mg .  She feels this is helpful.  Will get repeat labs in 3 months to assess response to lifestyle changes and dietary monitoring.  She is working on limiting simple carbohydrates and increasing total protein intake.      Hyperlipidemia   Patient was off Crestor  for a couple weeks prior to her most recent labs. Total cholesterol on those labs 280.  Triglycerides 434, HDL 42, LDL 155.  She is back on Crestor  now.  Getting labs done with PCP in December.  Patient to continue to work on limiting saturated fats to less than 20% of total daily intake.      Obesity, Beginnin BMI 39   Other Visit Diagnoses       BMI 40.0-44.9, adult (HCC)           Vitals Temp: 97.7 F (36.5 C) BP: 138/79 Pulse Rate: 80 SpO2: 97 %   Anthropometric Measurements Height: 5' 6.5 (1.689 m) Weight: 253 lb (114.8 kg) BMI (Calculated): 40.23 Weight at Last Visit: 257  lb Weight Lost Since Last Visit: 4 Weight Gained Since Last Visit: 0 Starting Weight: 249 lb Total Weight Loss (lbs): 0 lb (0 kg)   Body Composition  Body Fat %: 48.9 % Fat Mass (lbs): 124 lbs Muscle Mass (lbs): 122.8 lbs Total Body Water (lbs): 95.6 lbs Visceral Fat Rating : 16   Other Clinical Data Fasting: no Labs: no Today's Visit #: 102 Starting Date: 09/17/17 Comments: cat 3     ASSESSMENT AND PLAN: Assessment & Plan Other hyperlipidemia Patient was off Crestor  for a couple weeks prior to her most recent labs. Total cholesterol on those labs 280.  Triglycerides 434, HDL 42, LDL 155.  She is back on Crestor  now.  Getting labs done with PCP in December.  Patient to continue to work on limiting saturated fats to less than 20% of total daily intake. Pre-diabetes Recent A1c of 6.1 on labs with PCP discussed today.  Patient is now on metformin  twice a day of 1000mg .  She feels this is helpful.  Will get repeat labs in 3 months to assess response to lifestyle changes and dietary monitoring.  She is working on limiting simple carbohydrates and increasing total protein intake. Obesity, Beginning BMI 39.0  BMI 40.0-44.9, adult (HCC)    Diet: [place] is currently in  the action stage of change. As such, her goal is to continue with weight loss efforts and has agreed to the Category 3 Plan.   Exercise:  For substantial health benefits, adults should do at least 150 minutes (2 hours and 30 minutes) a week of moderate-intensity, or 75 minutes (1 hour and 15 minutes) a week of vigorous-intensity aerobic physical activity, or an equivalent combination of moderate- and vigorous-intensity aerobic activity. Aerobic activity should be performed in episodes of at least 10 minutes, and preferably, it should be spread throughout the week.  Behavior Modification:  We discussed the following Behavioral Modification Strategies today: increasing lean protein intake, decreasing simple  carbohydrates, increasing vegetables, and meal planning and cooking strategies.   Return in about 5 weeks (around 06/09/2024).   She was informed of the importance of frequent follow up visits to maximize her success with intensive lifestyle modifications for her multiple health conditions.  Attestation Statements:   Reviewed by clinician on day of visit: allergies, medications, problem list, medical history, surgical history, family history, social history, and previous encounter notes.     [name], MD

## 2024-05-05 NOTE — Progress Notes (Deleted)
 error

## 2024-05-05 NOTE — Assessment & Plan Note (Addendum)
 Recent A1c of 6.1 on labs with PCP discussed today.  Patient is now on metformin  twice a day of 1000mg .  She feels this is helpful.  Will get repeat labs in 3 months to assess response to lifestyle changes and dietary monitoring.  She is working on limiting simple carbohydrates and increasing total protein intake.

## 2024-05-05 NOTE — Assessment & Plan Note (Addendum)
 Patient was off Crestor  for a couple weeks prior to her most recent labs. Total cholesterol on those labs 280.  Triglycerides 434, HDL 42, LDL 155.  She is back on Crestor  now.  Getting labs done with PCP in December.  Patient to continue to work on limiting saturated fats to less than 20% of total daily intake.

## 2024-05-06 ENCOUNTER — Ambulatory Visit (INDEPENDENT_AMBULATORY_CARE_PROVIDER_SITE_OTHER): Payer: Self-pay | Admitting: Family Medicine

## 2024-06-03 ENCOUNTER — Other Ambulatory Visit: Payer: Self-pay

## 2024-06-03 ENCOUNTER — Other Ambulatory Visit (HOSPITAL_COMMUNITY): Payer: Self-pay

## 2024-06-03 ENCOUNTER — Other Ambulatory Visit (INDEPENDENT_AMBULATORY_CARE_PROVIDER_SITE_OTHER): Payer: Self-pay | Admitting: Family Medicine

## 2024-06-03 DIAGNOSIS — E785 Hyperlipidemia, unspecified: Secondary | ICD-10-CM | POA: Diagnosis not present

## 2024-06-03 DIAGNOSIS — R7303 Prediabetes: Secondary | ICD-10-CM

## 2024-06-03 DIAGNOSIS — Z79899 Other long term (current) drug therapy: Secondary | ICD-10-CM | POA: Diagnosis not present

## 2024-06-04 ENCOUNTER — Encounter: Payer: Self-pay | Admitting: Pharmacist

## 2024-06-04 ENCOUNTER — Other Ambulatory Visit: Payer: Self-pay

## 2024-06-09 ENCOUNTER — Other Ambulatory Visit: Payer: Self-pay

## 2024-06-15 ENCOUNTER — Encounter (INDEPENDENT_AMBULATORY_CARE_PROVIDER_SITE_OTHER): Payer: Self-pay | Admitting: Family Medicine

## 2024-06-15 ENCOUNTER — Other Ambulatory Visit (HOSPITAL_COMMUNITY): Payer: Self-pay

## 2024-06-15 ENCOUNTER — Ambulatory Visit (INDEPENDENT_AMBULATORY_CARE_PROVIDER_SITE_OTHER): Admitting: Family Medicine

## 2024-06-15 VITALS — BP 131/70 | HR 100 | Temp 98.4°F | Ht 66.5 in | Wt 250.0 lb

## 2024-06-15 DIAGNOSIS — R7303 Prediabetes: Secondary | ICD-10-CM | POA: Diagnosis not present

## 2024-06-15 DIAGNOSIS — E669 Obesity, unspecified: Secondary | ICD-10-CM

## 2024-06-15 DIAGNOSIS — Z6839 Body mass index (BMI) 39.0-39.9, adult: Secondary | ICD-10-CM

## 2024-06-15 DIAGNOSIS — E7849 Other hyperlipidemia: Secondary | ICD-10-CM

## 2024-06-15 MED ORDER — METFORMIN HCL ER 500 MG PO TB24
500.0000 mg | ORAL_TABLET | Freq: Two times a day (BID) | ORAL | 0 refills | Status: DC
  Filled 2024-06-15: qty 180, 90d supply, fill #0

## 2024-06-15 NOTE — Assessment & Plan Note (Addendum)
 Patient had repeat FLP done that showed triglycerides were still elevated despite compliance with her medication.  She looked at recommendations online and is trying to limit her fried food and her potato products and incorporate fish weekly.  Follow-up on fasting lipid panel in 2 to 3 months.

## 2024-06-15 NOTE — Assessment & Plan Note (Addendum)
 Recent A1c of 6.1.  She is liking the split of the metformin  to twice a day as it has decreased her bathroom issues.  Continue metformin  at current dosage and timing.  Refill sent into pharmacy today.

## 2024-06-15 NOTE — Progress Notes (Signed)
 "  SUBJECTIVE:  Chief Complaint: Obesity  Interim History: Patient mentions that for Thanksgiving she drove down to [place]  and spent time with family.  She ate a bit but the focus was on the dinner and not the dessert. Patient mentions that she and her siblings compared medication and they are all on statins for cholesterol control.  Her daughters are working up to Christmas.  Holiday meals has not been a chore of her but the cookie gifts from others have been somewhat of a saboteur.   Cheree is here to discuss her progress with her obesity treatment plan. She is on the Category 3 Plan and states she is following her eating plan approximately 75 % of the time. She states she is walking.     OBJECTIVE: Visit Diagnoses: Problem List Items Addressed This Visit       Other   Pre-diabetes - Primary   Relevant Medications   metFORMIN  (GLUCOPHAGE -XR) 500 MG 24 hr tablet   Hyperlipidemia   Obesity, Beginnin BMI 39   Relevant Medications   metFORMIN  (GLUCOPHAGE -XR) 500 MG 24 hr tablet   Other Visit Diagnoses       BMI 39.0-39.9,adult           Vitals Temp: 98.4 F (36.9 C) BP: 131/70 Pulse Rate: 100 SpO2: 96 %   Anthropometric Measurements Height: 5' 6.5 (1.689 m) Weight: 250 lb (113.4 kg) BMI (Calculated): 39.75 Weight at Last Visit: 253 lb Weight Lost Since Last Visit: 3 Weight Gained Since Last Visit: 0 Starting Weight: 249 lb Total Weight Loss (lbs): 0 lb (0 kg)   Body Composition  Body Fat %: 47.8 % Fat Mass (lbs): 119.6 lbs Muscle Mass (lbs): 123.8 lbs Total Body Water (lbs): 93.2 lbs Visceral Fat Rating : 15   Other Clinical Data Today's Visit #: 103 Starting Date: 09/17/17 Comments: Cat 3     ASSESSMENT AND PLAN: Assessment & Plan Pre-diabetes Recent A1c of 6.1.  She is liking the split of the metformin  to twice a day as it has decreased her bathroom issues.  Continue metformin  at current dosage and timing.  Refill sent into pharmacy today. Other  hyperlipidemia Patient had repeat FLP done that showed triglycerides were still elevated despite compliance with her medication.  She looked at recommendations online and is trying to limit her fried food and her potato products and incorporate fish weekly.  Follow-up on fasting lipid panel in 2 to 3 months. BMI 39.0-39.9,adult  Obesity, Beginning BMI 39.0    Diet: [place] is currently in the action stage of change. As such, her goal is to continue with weight loss efforts and has agreed to the Category 3 Plan.   Exercise:  For substantial health benefits, adults should do at least 150 minutes (2 hours and 30 minutes) a week of moderate-intensity, or 75 minutes (1 hour and 15 minutes) a week of vigorous-intensity aerobic physical activity, or an equivalent combination of moderate- and vigorous-intensity aerobic activity. Aerobic activity should be performed in episodes of at least 10 minutes, and preferably, it should be spread throughout the week.  Behavior Modification:  We discussed the following Behavioral Modification Strategies today: increasing lean protein intake, decreasing simple carbohydrates, meal planning and cooking strategies, keeping healthy foods in the home, holiday eating strategies, and planning for success.   Return in about 4 weeks (around 07/13/2024).   She was informed of the importance of frequent follow up visits to maximize her success with intensive lifestyle modifications for her multiple health  conditions.  Attestation Statements:   Reviewed by clinician on day of visit: allergies, medications, problem list, medical history, surgical history, family history, social history, and previous encounter notes.   [name], MD "

## 2024-06-25 ENCOUNTER — Other Ambulatory Visit (HOSPITAL_COMMUNITY): Payer: Self-pay

## 2024-07-05 ENCOUNTER — Other Ambulatory Visit: Payer: Self-pay

## 2024-07-06 ENCOUNTER — Other Ambulatory Visit (HOSPITAL_COMMUNITY): Payer: Self-pay

## 2024-07-22 ENCOUNTER — Encounter (HOSPITAL_COMMUNITY): Payer: Self-pay

## 2024-07-22 ENCOUNTER — Ambulatory Visit (INDEPENDENT_AMBULATORY_CARE_PROVIDER_SITE_OTHER): Admitting: Family Medicine

## 2024-07-22 ENCOUNTER — Other Ambulatory Visit (HOSPITAL_COMMUNITY): Payer: Self-pay

## 2024-07-22 ENCOUNTER — Encounter (INDEPENDENT_AMBULATORY_CARE_PROVIDER_SITE_OTHER): Payer: Self-pay | Admitting: Family Medicine

## 2024-07-22 VITALS — BP 137/71 | HR 84 | Temp 98.5°F | Ht 66.5 in | Wt 252.0 lb

## 2024-07-22 DIAGNOSIS — E669 Obesity, unspecified: Secondary | ICD-10-CM

## 2024-07-22 DIAGNOSIS — R7303 Prediabetes: Secondary | ICD-10-CM

## 2024-07-22 DIAGNOSIS — Z6841 Body Mass Index (BMI) 40.0 and over, adult: Secondary | ICD-10-CM

## 2024-07-22 MED ORDER — METFORMIN HCL ER 500 MG PO TB24
500.0000 mg | ORAL_TABLET | Freq: Two times a day (BID) | ORAL | 0 refills | Status: AC
  Filled 2024-07-22: qty 180, 90d supply, fill #0

## 2024-07-22 NOTE — Progress Notes (Signed)
 "  SUBJECTIVE:  Chief Complaint: Obesity  Interim History: Patient worked a good deal over the holidays and mentions it was more than she does normally.  She and her daughter recently got into [employer].  Her daughter is coming home for spring break.  They are planning a trip to [place] to see an exhibit of a designer and some of his work.  She is planning a few things.  With working more hours she hasn't bought groceries the way she normally does and she has pulled out her treadmill at work a few times.  She is moving around but not sitting around. She is planning on picking up extra hours until March. Is eating 2 meals a day when she is working so much.  Interested in oats overnight.  Candace [name] is here to discuss her progress with her obesity treatment plan. She is on the Category 3 Plan and states she is following her eating plan approximately 50 % of the time. She states she is walking 30-50 minutes 2-3 times per week.   OBJECTIVE: Visit Diagnoses: Problem List Items Addressed This Visit       Other   Pre-diabetes - Primary   Relevant Medications   metFORMIN  (GLUCOPHAGE -XR) 500 MG 24 hr tablet   Obesity, Beginnin BMI 39   Relevant Medications   metFORMIN  (GLUCOPHAGE -XR) 500 MG 24 hr tablet   Other Visit Diagnoses       BMI 40.0-44.9, adult (HCC)       Relevant Medications   metFORMIN  (GLUCOPHAGE -XR) 500 MG 24 hr tablet       Vitals Temp: 98.5 F (36.9 C) BP: 137/71 Pulse Rate: 84 SpO2: 97 %   Anthropometric Measurements Height: 5' 6.5 (1.689 m) Weight: 252 lb (114.3 kg) BMI (Calculated): 40.07 Weight at Last Visit: 250 lb Weight Lost Since Last Visit: 0 Weight Gained Since Last Visit: 2 Starting Weight: 249 lb Total Weight Loss (lbs): 0 lb (0 kg)   Body Composition  Body Fat %: 48.4 % Fat Mass (lbs): 122.2 lbs Muscle Mass (lbs): 123.6 lbs Total Body Water (lbs): 94 lbs Visceral Fat Rating : 16   Other Clinical Data Today's Visit #: 104 Starting Date:  09/17/17 Comments: Cat 3     ASSESSMENT AND PLAN: Assessment & Plan Pre-diabetes Patient is currently tolerating metformin  twice a day dosing with no GI symptoms or side effects.  Needs refill of metformin  today at current dose.  Refill sent into pharmacy.  Will discuss simple carbohydrate intake at next appointment or whether or not this has improved as the holiday season gets farther into the past. BMI 40.0-44.9, adult (HCC)  Obesity, Beginning BMI 39.0    Diet: [place] is currently in the action stage of change. As such, her goal is to continue with weight loss efforts and has agreed to the Category 3 Plan.   Exercise:  For substantial health benefits, adults should do at least 150 minutes (2 hours and 30 minutes) a week of moderate-intensity, or 75 minutes (1 hour and 15 minutes) a week of vigorous-intensity aerobic physical activity, or an equivalent combination of moderate- and vigorous-intensity aerobic activity. Aerobic activity should be performed in episodes of at least 10 minutes, and preferably, it should be spread throughout the week.  Behavior Modification:  We discussed the following Behavioral Modification Strategies today: increasing lean protein intake, decreasing simple carbohydrates, increasing vegetables, meal planning and cooking strategies, keeping healthy foods in the home, and planning for success.   Return in about 5  weeks (around 08/26/2024).   She was informed of the importance of frequent follow up visits to maximize her success with intensive lifestyle modifications for her multiple health conditions.  Attestation Statements:   Reviewed by clinician on day of visit: allergies, medications, problem list, medical history, surgical history, family history, social history, and previous encounter notes.  [name], MD "

## 2024-07-23 ENCOUNTER — Other Ambulatory Visit (HOSPITAL_COMMUNITY): Payer: Self-pay

## 2024-08-01 NOTE — Assessment & Plan Note (Signed)
 Patient is currently tolerating metformin  twice a day dosing with no GI symptoms or side effects.  Needs refill of metformin  today at current dose.  Refill sent into pharmacy.  Will discuss simple carbohydrate intake at next appointment or whether or not this has improved as the holiday season gets farther into the past.

## 2024-08-24 ENCOUNTER — Ambulatory Visit (INDEPENDENT_AMBULATORY_CARE_PROVIDER_SITE_OTHER): Admitting: Family Medicine
